# Patient Record
Sex: Male | Born: 1945 | Race: White | Hispanic: No | Marital: Married | Smoking: Never smoker
Health system: Southern US, Community
[De-identification: ages and names within clinical notes are randomized; demographics above are authoritative.]

## PROBLEM LIST (undated history)

## (undated) DIAGNOSIS — I6529 Occlusion and stenosis of unspecified carotid artery: Secondary | ICD-10-CM

## (undated) DIAGNOSIS — Z87442 Personal history of urinary calculi: Secondary | ICD-10-CM

## (undated) DIAGNOSIS — I499 Cardiac arrhythmia, unspecified: Secondary | ICD-10-CM

## (undated) DIAGNOSIS — K760 Fatty (change of) liver, not elsewhere classified: Secondary | ICD-10-CM

## (undated) DIAGNOSIS — E119 Type 2 diabetes mellitus without complications: Secondary | ICD-10-CM

## (undated) DIAGNOSIS — I350 Nonrheumatic aortic (valve) stenosis: Secondary | ICD-10-CM

## (undated) DIAGNOSIS — I10 Essential (primary) hypertension: Secondary | ICD-10-CM

## (undated) DIAGNOSIS — R011 Cardiac murmur, unspecified: Secondary | ICD-10-CM

## (undated) DIAGNOSIS — I25810 Atherosclerosis of coronary artery bypass graft(s) without angina pectoris: Secondary | ICD-10-CM

## (undated) DIAGNOSIS — I739 Peripheral vascular disease, unspecified: Secondary | ICD-10-CM

## (undated) DIAGNOSIS — I70219 Atherosclerosis of native arteries of extremities with intermittent claudication, unspecified extremity: Secondary | ICD-10-CM

## (undated) DIAGNOSIS — E7800 Pure hypercholesterolemia, unspecified: Secondary | ICD-10-CM

## (undated) HISTORY — DX: Essential (primary) hypertension: I10

## (undated) HISTORY — DX: Occlusion and stenosis of unspecified carotid artery: I65.29

## (undated) HISTORY — PX: CHOLECYSTECTOMY: SHX55

## (undated) HISTORY — PX: CORONARY ANGIOPLASTY WITH STENT PLACEMENT: SHX49

## (undated) HISTORY — DX: Cardiac arrhythmia, unspecified: I49.9

## (undated) HISTORY — DX: Atherosclerosis of native arteries of extremities with intermittent claudication, unspecified extremity: I70.219

## (undated) HISTORY — PX: CORONARY ARTERY BYPASS GRAFT: SHX141

## (undated) HISTORY — DX: Cardiac murmur, unspecified: R01.1

## (undated) HISTORY — PX: INGUINAL HERNIA REPAIR: SUR1180

## (undated) HISTORY — DX: Atherosclerosis of coronary artery bypass graft(s) without angina pectoris: I25.810

## (undated) HISTORY — PX: ADENOIDECTOMY: SUR15

## (undated) HISTORY — DX: Pure hypercholesterolemia, unspecified: E78.00

## (undated) HISTORY — PX: TONSILLECTOMY: SUR1361

## (undated) HISTORY — PX: OTHER SURGICAL HISTORY: SHX169

---

## 1998-04-02 ENCOUNTER — Ambulatory Visit (HOSPITAL_COMMUNITY): Admission: RE | Admit: 1998-04-02 | Discharge: 1998-04-02 | Payer: Self-pay | Admitting: Gastroenterology

## 2002-12-25 ENCOUNTER — Encounter: Admission: RE | Admit: 2002-12-25 | Discharge: 2003-03-25 | Payer: Self-pay | Admitting: Family Medicine

## 2007-10-08 ENCOUNTER — Encounter: Admission: RE | Admit: 2007-10-08 | Discharge: 2007-10-08 | Payer: Self-pay | Admitting: Otolaryngology

## 2008-02-26 ENCOUNTER — Ambulatory Visit: Payer: Self-pay | Admitting: Cardiology

## 2008-02-26 ENCOUNTER — Inpatient Hospital Stay (HOSPITAL_COMMUNITY): Admission: EM | Admit: 2008-02-26 | Discharge: 2008-03-03 | Payer: Self-pay | Admitting: Cardiology

## 2008-02-27 ENCOUNTER — Ambulatory Visit: Payer: Self-pay | Admitting: Cardiothoracic Surgery

## 2008-02-27 DIAGNOSIS — I25810 Atherosclerosis of coronary artery bypass graft(s) without angina pectoris: Secondary | ICD-10-CM | POA: Insufficient documentation

## 2008-03-05 ENCOUNTER — Ambulatory Visit: Payer: Self-pay | Admitting: Internal Medicine

## 2008-03-05 ENCOUNTER — Inpatient Hospital Stay (HOSPITAL_COMMUNITY): Admission: EM | Admit: 2008-03-05 | Discharge: 2008-03-07 | Payer: Self-pay | Admitting: Internal Medicine

## 2008-03-24 ENCOUNTER — Ambulatory Visit: Payer: Self-pay | Admitting: Cardiothoracic Surgery

## 2008-03-24 ENCOUNTER — Encounter
Admission: RE | Admit: 2008-03-24 | Discharge: 2008-03-24 | Payer: Self-pay | Admitting: Thoracic Surgery (Cardiothoracic Vascular Surgery)

## 2008-04-08 ENCOUNTER — Ambulatory Visit: Payer: Self-pay | Admitting: Cardiovascular Disease

## 2008-04-17 ENCOUNTER — Encounter (HOSPITAL_COMMUNITY): Admission: RE | Admit: 2008-04-17 | Discharge: 2008-07-16 | Payer: Self-pay | Admitting: Cardiovascular Disease

## 2008-04-25 ENCOUNTER — Ambulatory Visit: Payer: Self-pay

## 2008-06-09 ENCOUNTER — Ambulatory Visit: Payer: Self-pay | Admitting: Cardiovascular Disease

## 2008-06-09 LAB — CONVERTED CEMR LAB
Albumin: 3.9 g/dL (ref 3.5–5.2)
Calcium: 9.1 mg/dL (ref 8.4–10.5)
Creatinine, Ser: 1 mg/dL (ref 0.4–1.5)
GFR calc Af Amer: 97 mL/min
Glucose, Bld: 127 mg/dL — ABNORMAL HIGH (ref 70–99)
Total Bilirubin: 0.9 mg/dL (ref 0.3–1.2)
Total CHOL/HDL Ratio: 4
Total Protein: 7.3 g/dL (ref 6.0–8.3)

## 2008-06-20 ENCOUNTER — Ambulatory Visit: Payer: Self-pay | Admitting: Cardiovascular Disease

## 2008-08-12 ENCOUNTER — Ambulatory Visit: Payer: Self-pay | Admitting: Cardiovascular Disease

## 2008-08-15 ENCOUNTER — Ambulatory Visit: Payer: Self-pay | Admitting: Internal Medicine

## 2008-08-15 ENCOUNTER — Inpatient Hospital Stay (HOSPITAL_COMMUNITY): Admission: AD | Admit: 2008-08-15 | Discharge: 2008-08-16 | Payer: Self-pay | Admitting: Internal Medicine

## 2008-09-10 ENCOUNTER — Ambulatory Visit: Payer: Self-pay | Admitting: Cardiovascular Disease

## 2008-09-13 DIAGNOSIS — I70219 Atherosclerosis of native arteries of extremities with intermittent claudication, unspecified extremity: Secondary | ICD-10-CM | POA: Insufficient documentation

## 2008-09-13 DIAGNOSIS — I10 Essential (primary) hypertension: Secondary | ICD-10-CM | POA: Insufficient documentation

## 2008-09-13 DIAGNOSIS — E785 Hyperlipidemia, unspecified: Secondary | ICD-10-CM | POA: Insufficient documentation

## 2008-11-24 ENCOUNTER — Ambulatory Visit: Payer: Self-pay | Admitting: Cardiovascular Disease

## 2008-11-24 LAB — CONVERTED CEMR LAB
Alkaline Phosphatase: 75 units/L (ref 39–117)
Bilirubin, Direct: 0.1 mg/dL (ref 0.0–0.3)
Total Bilirubin: 1 mg/dL (ref 0.3–1.2)
Total CHOL/HDL Ratio: 4.3

## 2008-12-01 ENCOUNTER — Encounter: Payer: Self-pay | Admitting: Cardiovascular Disease

## 2008-12-01 ENCOUNTER — Ambulatory Visit: Payer: Self-pay | Admitting: Cardiovascular Disease

## 2009-04-13 ENCOUNTER — Ambulatory Visit: Payer: Self-pay | Admitting: Cardiovascular Disease

## 2009-04-15 ENCOUNTER — Ambulatory Visit: Payer: Self-pay | Admitting: Cardiovascular Disease

## 2009-04-21 LAB — CONVERTED CEMR LAB
ALT: 21 units/L (ref 0–53)
HDL: 36.8 mg/dL — ABNORMAL LOW (ref >39.00)
Total Bilirubin: 1 mg/dL (ref 0.3–1.2)
Total CHOL/HDL Ratio: 4
Triglycerides: 109 mg/dL (ref 0.0–149.0)
VLDL: 21.8 mg/dL (ref 0.0–40.0)

## 2009-10-01 ENCOUNTER — Encounter: Payer: Self-pay | Admitting: Cardiovascular Disease

## 2009-10-19 ENCOUNTER — Ambulatory Visit: Payer: Self-pay | Admitting: Cardiovascular Disease

## 2009-11-09 ENCOUNTER — Ambulatory Visit: Payer: Self-pay | Admitting: Cardiovascular Disease

## 2009-11-11 ENCOUNTER — Telehealth (INDEPENDENT_AMBULATORY_CARE_PROVIDER_SITE_OTHER): Payer: Self-pay | Admitting: *Deleted

## 2009-11-11 LAB — CONVERTED CEMR LAB
AST: 18 units/L (ref 0–37)
Alkaline Phosphatase: 53 units/L (ref 39–117)
Bilirubin, Direct: 0.1 mg/dL (ref 0.0–0.3)
Cholesterol: 159 mg/dL (ref 0–200)

## 2010-05-10 ENCOUNTER — Ambulatory Visit: Payer: Self-pay | Admitting: Cardiovascular Disease

## 2010-05-13 ENCOUNTER — Encounter: Payer: Self-pay | Admitting: Cardiovascular Disease

## 2010-11-04 ENCOUNTER — Encounter: Payer: Self-pay | Admitting: Cardiovascular Disease

## 2010-11-08 ENCOUNTER — Encounter: Payer: Self-pay | Admitting: Cardiovascular Disease

## 2010-11-09 ENCOUNTER — Encounter: Payer: Self-pay | Admitting: Cardiovascular Disease

## 2010-11-09 ENCOUNTER — Other Ambulatory Visit: Payer: Self-pay | Admitting: Cardiovascular Disease

## 2010-11-09 ENCOUNTER — Ambulatory Visit
Admission: RE | Admit: 2010-11-09 | Discharge: 2010-11-09 | Payer: Self-pay | Source: Home / Self Care | Attending: Cardiovascular Disease | Admitting: Cardiovascular Disease

## 2010-11-10 LAB — CBC WITH DIFFERENTIAL/PLATELET
Basophils Absolute: 0 10*3/uL (ref 0.0–0.1)
Basophils Relative: 0.5 % (ref 0.0–3.0)
HCT: 47.3 % (ref 39.0–52.0)
Hemoglobin: 16.2 g/dL (ref 13.0–17.0)
Lymphs Abs: 2.6 10*3/uL (ref 0.7–4.0)
Monocytes Absolute: 0.8 10*3/uL (ref 0.1–1.0)
Monocytes Relative: 8.6 % (ref 3.0–12.0)
Platelets: 273 10*3/uL (ref 150.0–400.0)
RDW: 13.8 % (ref 11.5–14.6)
WBC: 9.3 10*3/uL (ref 4.5–10.5)

## 2010-11-10 LAB — BASIC METABOLIC PANEL
Calcium: 9.7 mg/dL (ref 8.4–10.5)
Creatinine, Ser: 1.1 mg/dL (ref 0.4–1.5)
Potassium: 4 mEq/L (ref 3.5–5.1)
Sodium: 142 mEq/L (ref 135–145)

## 2010-11-10 LAB — PROTIME-INR: INR: 1 ratio (ref 0.8–1.0)

## 2010-11-14 LAB — CONVERTED CEMR LAB
Albumin: 4.3 g/dL (ref 3.5–5.2)
Bilirubin, Direct: 0.2 mg/dL (ref 0.0–0.3)
LDL Cholesterol: 100 mg/dL — ABNORMAL HIGH (ref 0–99)
Total Protein: 7.3 g/dL (ref 6.0–8.3)
Triglycerides: 126 mg/dL (ref 0.0–149.0)

## 2010-11-15 ENCOUNTER — Ambulatory Visit (HOSPITAL_COMMUNITY)
Admission: RE | Admit: 2010-11-15 | Discharge: 2010-11-15 | Payer: Self-pay | Source: Home / Self Care | Attending: Cardiovascular Disease | Admitting: Cardiovascular Disease

## 2010-11-15 LAB — GLUCOSE, CAPILLARY: Glucose-Capillary: 160 mg/dL — ABNORMAL HIGH (ref 70–99)

## 2010-11-16 NOTE — Miscellaneous (Signed)
Summary: Orders Update  Clinical Lists Changes  Orders: Added new Test order of TLB-Lipid Panel (80061-LIPID) - Signed Added new Test order of TLB-Hepatic/Liver Function Pnl (80076-HEPATIC) - Signed

## 2010-11-16 NOTE — Procedures (Signed)
NAMEREINO, LYBBERT                 ACCOUNT NO.:  [id number]  MEDICAL RECORD NO.:  [id number]          PATIENT TYPE:  OIB  LOCATION:  2899                         FACILITY:  [facility]  PHYSICIAN:  [name]. [name], MD  DATE OF BIRTH:  August 26, 1946  DATE OF PROCEDURE:  11/15/2010 DATE OF DISCHARGE:  11/15/2010                           CARDIAC CATHETERIZATION   PROCEDURE: 1. Selective coronary angiography. 2. Saphenous vein graft angiography. 3. Left internal mammary artery angiography.  PROCEDURAL INDICATIONS:  Mr. Pfost is a 65 year old gentleman with CAD status post coronary bypass surgery in 2009.  He had early vein graft disease and was treated with PCI in October 2009 involving the distal anastomotic site of the saphenous vein graft to obtuse marginal.  He was treated with a drug-eluting stent.  He underwent exercise treadmill testing and was found to have significant ECG changes and ventricular ectopy.  He was referred for cardiac catheterization to evaluate for progressive obstructive disease in the setting of his extensive CAD and bypass graft disease.  Risks and indications of procedure were reviewed with the patient and informed consent was obtained.  The left wrist was prepped, draped, anesthetized with 1% lidocaine.  Using modified Seldinger technique, a 5- French sheath was placed in the left radial artery.  Unfractionated heparin 4000 units was administered intravenously, 3 mg of verapamil was administered through the radial sheath.  Standard Judkins catheters were used for coronary angiography.  Left internal mammary catheter was used to image the LIMA.  An AL-1 catheter was used to image the saphenous vein graft to diagonal.  The JR-4 catheter was used to image the saphenous vein graft to OM and an MPA-1 catheter was used to image the saphenous vein graft to distal right coronary artery.  All catheter exchanges were performed over an exchange length J-wire.  The  patient tolerated the procedure well.  There were no immediate complications.  PROCEDURAL FINDINGS: 1. Aortic pressure is 144/82 with a mean of 100. 2. Left main coronary artery:  The left mainstem is severely calcified     with a 90% distal left main stenosis.  The ostial LAD is totally     occluded and heavily calcified.  The ostial Circumflex has diffuse 90% stenosis leading into the AV groove circumflex which has minimal filling because of competitive graft flow. 1. Right coronary artery:  The right coronary artery is patent     throughout its course and has diffuse irregularity with 50% mid     stenosis and 50% distal stenosis.  The ostium of the PDA branch     also has 50% stenosis.  There are no critical stenoses throughout     the course of the right coronary artery but there is diffuse     disease throughout the entire vessel.  The distal RCA and its     branch vessels fill competitively from both native and graft flow. 2. Saphenous vein graft angiography.  The saphenous vein graft to     obtuse marginal branch is patent.  There is a patent small stent at     the distal anastomotic site.  There is significant mismatch between     the size of the vein graft and the native circulation, but the     graft flow is good and it is greatly improved from the previous     study.  There is mild-to-moderate diffuse restenosis throughout the     small stent at that site.  Both OM branches fill well with TIMI 3     flow. 3. Saphenous vein graft to diagonal is widely patent.  There is no     significant stenosis.  The graft also retrograde fills the LAD.     The proximal diagonal before the graft insertion site has 95%     stenosis.  The proximal LAD has 90% stenosis just beyond the origin     of the grafted diagonal branch. 4. Saphenous vein graft to distal right coronary artery is widely     patent.  There is no significant stenosis throughout the body of     that graft. 5. Left internal  mammary artery to LAD is widely patent.  The LAD     fills competitively with much of the flow coming from the diagonal     but also some antegrade flow through the left internal mammary.  ASSESSMENT: 1. Severe three-vessel coronary artery disease with severe distal left     main stenosis, total occlusion of the left anterior descending,     severe proximal left circumflex stenosis, and moderate diffuse     right coronary artery stenosis. 2. Status post coronary bypass surgery with continued patency of the     saphenous vein graft to distal right coronary artery, saphenous     vein graft to diagonal, and left internal mammary artery to left     anterior descending. 3. Patency of the saphenous vein graft to obtuse marginal with size     mismatch between the vein graft and the distal vessel but continued     patency of the stent at distal anastomotic site.  RECOMMENDATIONS:  Recommend continued medical therapy.     [name]. [name], MD     MDC/MEDQ  D:  11/15/2010  T:  11/15/2010  Job:  [id number]  cc:   [name], M.D.  Electronically Signed by Sherren Mocha MD on 11/16/2010 04:58:54 AM

## 2010-11-16 NOTE — Assessment & Plan Note (Signed)
Summary: f2mdiscuss cholesterol/lwb   Visit Type:  Follow-up Primary Provider:  TOdis Luster MD  CC:  leg pain.  History of Present Illness: Mr. [name] [name] a 65year old gentleman with coronary artery disease who underwent coronary bypass surgery in May 2009 followed by PCI of the saphenous vein graft to OM anastomotic site in October 2009. He presents today for followup.  The patient is doing well at present. He develops mild chest pain when walking on the treadmill, occurring at about 7 min. He is able to 'walk through it' and continues for a 20 min walk. The patient denies dyspnea, orthopnea, PND, edema, palpitations, lightheadedness, or syncope.  He also has mild right leg claudication involving the calf wiht heavy exertion. This is stable from previous.   Current Medications (verified): 1)  Simvastatin 40 Mg Tabs (Simvastatin) .... Take One Tablet By Mouth Daily At Bedtime 2)  Plavix 75 Mg Tabs (Clopidogrel Bisulfate) .... Take One Tablet By Mouth Daily 3)  Benazepril Hcl 40 Mg Tabs (Benazepril Hcl) .... Take 1 Tablet By Mouth Once A Day 4)  Metoprolol Tartrate 50 Mg Tabs (Metoprolol Tartrate) .... Take One and A Half  Tablet By Mouth Twice A Day 5)  Aspirin 81 Mg Tbec (Aspirin) .... Take One Tablet By Mouth Daily 6)  Nitrolingual 0.4 Mg/spray Soln (Nitroglycerin) .... One Spray Under Tongue Every 5 Minutes As Needed For Chest Pain---May Repeat Times Three  Allergies (verified): No Known Drug Allergies  Past History:  Past medical history reviewed for relevance to current acute and chronic problems.  Past Medical History: Reviewed history from 09/13/2008 and no changes required. CAD s/p CABG 02/27/08 PCI SVG-OM 08/15/08 PAD with ABI 0.7 on right, distal popliteal occlusion, mild intermittent claudication hyperlipidemia hypertension impaired fasting glucose  Review of Systems       Negative except as per HPI   Vital Signs:  Patient profile:   65year old  male Height:      70 inches Weight:      206 pounds BMI:     29.66 Pulse rate:   63 / minute Resp:     16 per minute BP sitting:   130 / 86  (left arm)  Vitals Entered By: CLevora Angel CNA (October 19, 2009 9:01 AM)  Physical Exam  General:  Pt is alert and oriented, in no acute distress. HEENT: normal Neck: normal carotid upstrokes without bruits, JVP normal Lungs: CTA CV: RRR without murmur or gallop Abd: soft, NT, positive BS, no bruit, no organomegaly Ext: no clubbing, cyanosis, or edema.  Skin: warm and dry without rash    EKG  Procedure date:  10/19/2009  Findings:      NSR, within normal limits, HR 63 bpm.  Impression & Recommendations:  Problem # 1:  CAD, ARTERY BYPASS GRAFT (ICD-414.04) Stable with class II angina. Symptoms are not lifestyle limiting at present. Recommend continue current medical therapy.   His updated medication list for this problem includes:    Plavix 75 Mg Tabs (Clopidogrel bisulfate) ..Marland Kitchen.. Take one tablet by mouth daily    Benazepril Hcl 40 Mg Tabs (Benazepril hcl) ..Marland Kitchen.. Take 1 tablet by mouth once a day    Metoprolol Tartrate 50 Mg Tabs (Metoprolol tartrate) ..Marland Kitchen.. Take one and a half  tablet by mouth twice a day    Aspirin 81 Mg Tbec (Aspirin) ..Marland Kitchen.. Take one tablet by mouth daily    Nitrolingual 0.4 Mg/spray Soln (Nitroglycerin) ..... 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 Cont med Rx and walking program.  Problem # 3:  HYPERTENSION, BENIGN (ICD-401.1) Controlled. His updated medication list for this problem includes:    Benazepril Hcl 40 Mg Tabs (Benazepril hcl) .Marland Kitchen... Take 1 tablet by mouth once a day    Metoprolol Tartrate 50 Mg Tabs (Metoprolol tartrate) .Marland Kitchen... Take one and a half  tablet by mouth twice a day     Aspirin 81 Mg Tbec (Aspirin) .Marland Kitchen... Take one tablet by mouth daily  BP today: 130/86 Prior BP: 110/70 (04/13/2009)  Labs Reviewed: K+: 4.3 (06/09/2008) Creat: : 1.0 (06/09/2008)   Chol: 144 (04/15/2009)   HDL: 36.80 (04/15/2009)   LDL: 85 (04/15/2009)   TG: 109.0 (04/15/2009)  Problem # 4:  HYPERLIPIDEMIA-MIXED (ICD-272.4) Recent labs from Dr Lorette Ang office reviewed. LDL was 109. Recommend change simvastatin to crestor to try to achieve LDL < 100 mg/dL. The following medications were removed from the medication list:    Simvastatin 40 Mg Tabs (Simvastatin) .Marland Kitchen... Take one tablet by mouth daily at bedtime His updated medication list for this problem includes:    Crestor 20 Mg Tabs (Rosuvastatin calcium) .Marland Kitchen... Take one tablet by mouth daily.  CHOL: 144 (04/15/2009)   LDL: 85 (04/15/2009)   HDL: 36.80 (04/15/2009)   TG: 109.0 (04/15/2009)  Patient Instructions: 1)  Your physician has recommended you make the following change in your medication: Stop Sinvastatin. Start Crestor 20 mg once a day. 2)  Your physician recommends that you return for a FASTING lipid profile: And Liver Profile in 3 weeks.414.04 3)  Your physician recommends that you schedule a follow-up appointment in: 6 months. The office will mail you a reminder 2 months prior appointment date. Prescriptions: CRESTOR 20 MG TABS (ROSUVASTATIN CALCIUM) Take one tablet by mouth daily.  #90 x 3   Entered by:   Carollee Sires, RN, BSN   Authorized by:   Neale Burly, MD   Signed by:   Carollee Sires, RN, BSN on 10/19/2009   Method used:   Electronically to        [facility] (retail)       [phone]       [place], [place]  [address]       Ph: [phone]       Fax: [phone]   RxID:   [id number]

## 2010-11-16 NOTE — Progress Notes (Signed)
Summary: returning call  Phone Note Call from Patient Call back at Work Phone [phone]   Reason for Call: Talk to Nurse Summary of Call: returning call Initial call taken by: Darnell Level,  November 11, 2009 8:38 AM  Follow-up for Phone Call        Called patient back.  Question was regarding lipid results...  advised him to continue current rx and  push walking  and watch sweets.     Follow-up by: Gasper Sells, EMT,  November 11, 2009 8:49 AM

## 2010-11-16 NOTE — Miscellaneous (Signed)
Summary: Orders Update  Clinical Lists Changes

## 2010-11-16 NOTE — Assessment & Plan Note (Signed)
Summary: PER CHECK OUT   Visit Type:  Follow-up Primary Provider:  Odis Luster, MD  CC:  Indigestion.  History of Present Illness: Mr. Down is a 66 year old gentleman with coronary artery disease who underwent coronary bypass surgery in May 2009 followed by PCI of the saphenous vein graft to OM anastomotic site in October 2009. He presents today for followup.  At the time of his last visit he complained of exeritonal angina at 7 minutes of exercise, but he was able to continue on and symptoms would abate. He now reports these symptoms have resolved completely. He denies dyspnea, edema, or palpitations. He walks 20-25 minutes at a pace of 3.5 mph and a level 5 incline.  Complains of right calf claudication, but less than previous and not lifestyle-limiting.  Current Medications (verified): 1)  Plavix 75 Mg Tabs (Clopidogrel Bisulfate) .... Take One Tablet By Mouth Daily 2)  Benazepril Hcl 40 Mg Tabs (Benazepril Hcl) .... Take 1 Tablet By Mouth Once A Day 3)  Metoprolol Tartrate 50 Mg Tabs (Metoprolol Tartrate) .... Take One and A Half  Tablet By Mouth Twice A Day 4)  Aspirin 81 Mg Tbec (Aspirin) .... Take One Tablet By Mouth Daily 5)  Nitrolingual 0.4 Mg/spray Soln (Nitroglycerin) .... One Spray Under Tongue Every 5 Minutes As Needed For Chest Pain---May Repeat Times Three 6)  Crestor 20 Mg Tabs (Rosuvastatin Calcium) .... Take One Tablet By Mouth Daily.  Allergies (verified): No Known Drug Allergies  Past History:  Past medical history reviewed for relevance to current acute and chronic problems.  Past Medical History: Reviewed history from 09/13/2008 and no changes required. CAD s/p CABG 02/27/08 PCI SVG-OM 08/15/08 PAD with ABI 0.7 on right, distal popliteal occlusion, mild intermittent claudication hyperlipidemia hypertension impaired fasting glucose  Review of Systems       Negative except as per HPI   Vital Signs:  Patient profile:   65 year old male Height:       70 inches Weight:      211 pounds BMI:     30.38 Pulse rate:   65 / minute Pulse rhythm:   regular Resp:     18 per minute BP sitting:   120 / 80  (right arm) Cuff size:   large  Vitals Entered By: Sidney Ace (May 10, 2010 12:28 PM)  Physical Exam  General:  Pt is alert and oriented, in no acute distress. HEENT: normal Neck: normal carotid upstrokes without bruits, JVP normal Lungs: CTA CV: RRR without murmur or gallop Abd: soft, NT, positive BS, no bruit, no organomegaly Ext: no clubbing, cyanosis, or edema.  Skin: warm and dry without rash    EKG  Procedure date:  05/10/2010  Findings:      NSR, within normal limits, HR 65 bpm.  Impression & Recommendations:  Problem # 1:  CAD, ARTERY BYPASS GRAFT (ICD-414.04) Stable without angina. Continue risk reduction measures, f/u with a nonimaging exercise treadmill test in 6 months.  His updated medication list for this problem includes:    Plavix 75 Mg Tabs (Clopidogrel bisulfate) .Marland Kitchen... Take one tablet by mouth daily    Benazepril Hcl 40 Mg Tabs (Benazepril hcl) .Marland Kitchen... Take 1 tablet by mouth once a day    Metoprolol Tartrate 50 Mg Tabs (Metoprolol tartrate) .Marland Kitchen... Take one and a half  tablet by mouth twice a day    Aspirin 81 Mg Tbec (Aspirin) .Marland Kitchen... Take one tablet by mouth daily    Nitrolingual 0.4 Mg/spray Soln (Nitroglycerin) .Marland KitchenMarland KitchenMarland KitchenMarland Kitchen  One spray under tongue every 5 minutes as needed for chest pain---may repeat times three  Orders: EKG w/ Interpretation (93000) TLB-Lipid Panel (80061-LIPID) TLB-Hepatic/Liver Function Pnl (80076-HEPATIC) Treadmill (Treadmill)  Problem # 2:  HYPERLIPIDEMIA-MIXED (ICD-272.4) Prior lipid panel below - repeat lipids and LFT's today. If stable, will go to 12 month testing.  His updated medication list for this problem includes:    Crestor 20 Mg Tabs (Rosuvastatin calcium) .Marland Kitchen... Take one tablet by mouth daily.  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 nonlimiting right calf claudication. Continue current Rx.  Problem # 4:  HYPERTENSION, BENIGN (ICD-401.1) BP controlled.  His updated medication list for this problem includes:    Benazepril Hcl 40 Mg Tabs (Benazepril hcl) .Marland Kitchen... Take 1 tablet by mouth once a day    Metoprolol Tartrate 50 Mg Tabs (Metoprolol tartrate) .Marland Kitchen... Take one and a half  tablet by mouth twice a day    Aspirin 81 Mg Tbec (Aspirin) .Marland Kitchen... Take one tablet by mouth daily  BP today: 120/80 Prior BP: 130/86 (10/19/2009)  Labs Reviewed: K+: 4.3 (06/09/2008) Creat: : 1.0 (06/09/2008)   Chol: 159 (11/09/2009)   HDL: 38.10 (11/09/2009)   LDL: 83 (11/09/2009)   TG: 192.0 (11/09/2009)  Patient Instructions: 1)  Your physician recommends that you have lab work today: lipid/liver (414.01;272.0) 2)  Your physician has requested that you have an exercise tolerance test in 6 months with Dr. [name].  For further information please visit [email or web address].  Please also follow instruction sheet, as given. 3)  Your physician recommends that you continue on your current medications as directed. Please refer to the Current Medication list given to you today.

## 2010-11-16 NOTE — Letter (Signed)
Summary: [facility] [address] [place]   [place], [place] [address]   Phone: [phone]  Fax: [phone]     May 13, 2010 MRN: [id number]   [place], [place]  [address]   Dear Mr. Febo,  We have reviewed your cholesterol results.  They are as follows:     Total Cholesterol:    166 (Desirable: less than 200)       HDL  Cholesterol:     41.00  (Desirable: greater than 40 for men and 50 for women)       LDL Cholesterol:       100  (Desirable: less than 100 for low risk and less than 70 for moderate to high risk)       Triglycerides:       126.0  (Desirable: less than 150)  Our recommendations include: Continue your current medications and continue to work on diet/exercixe.  Your liver function was normal.   Call our office at the number listed above if you have any questions.  Lowering your LDL cholesterol is important, but it is only one of a large number of "risk factors" that may indicate that you are at risk for heart disease, stroke or other complications of hardening of the arteries.  Other risk factors include:   A.  Cigarette Smoking* B.  High Blood Pressure* C.  Obesity* D.   Low HDL Cholesterol (see yours above)* E.   Diabetes Mellitus (higher risk if your is uncontrolled) F.  Family history of premature heart disease G.  Previous history of stroke or cardiovascular disease    *These are risk factors YOU HAVE CONTROL OVER.  For more information, visit .  There is now evidence that lowering the TOTAL CHOLESTEROL AND LDL CHOLESTEROL can reduce the risk of heart disease.  The American Heart Association recommends the following guidelines for the treatment of elevated cholesterol:  1.  If there is now current heart disease and less than two risk factors, TOTAL CHOLESTEROL should be less than 200 and LDL CHOLESTEROL should be less than 100. 2.  If there is current heart disease or two or more  risk factors, TOTAL CHOLESTEROL should be less than 200 and LDL CHOLESTEROL should be less than 70.  A diet low in cholesterol, saturated fat, and calories is the cornerstone of treatment for elevated cholesterol.  Cessation of smoking and exercise are also important in the management of elevated cholesterol and preventing vascular disease.  Studies have shown that 30 to 60 minutes of physical activity most days can help lower blood pressure, lower cholesterol, and keep your weight at a healthy level.  Drug therapy is used when cholesterol levels do not respond to therapeutic lifestyle changes (smoking cessation, diet, and exercise) and remains unacceptably high.  If medication is started, it is important to have you levels checked periodically to evaluate the need for further treatment options.  Thank you,    [facility]

## 2010-11-18 NOTE — Letter (Signed)
Summary: Cardiac Catheterization Instructions- Main Lab  [facility]  [address] [place]   [place], [place] [address]   Phone: [phone]  Fax: [phone]     11/09/2010 MRN: [id number]  [place], [place]  [address]  Dear Mr. Mcduffie,   You are scheduled for Cardiac Catheterization on 11/15/10              with Dr. [name]  .  Please arrive at the [facility] at 5:30     a.m. on the day of your procedure.  1. DIET     __x__ Nothing to eat or drink after midnight except your medications with a sip of water.   2. MAKE SURE YOU TAKE YOUR ASPIRIN.        __x__ YOU MAY TAKE ALL of your remaining medications with a small amount of water.       3. Plan for one night stay - bring personal belongings (i.e. toothpaste, toothbrush, etc.)  4. Bring a current list of your medications and current insurance cards.  5. Must have a responsible person to drive you home.   6. Someone must be with yu for the first 24 hours after you arrive home.  7. Please wear clothes that are easy to get on and off and wear slip-on shoes.  *Special note: Every effort is made to have your procedure done on time.  Occasionally there are emergencies that present themselves at the hospital that may cause delays.  Please be patient if a delay does occur.  If you have any questions after you get home, please call the office at the number listed above.  Whitney Jannett Celestine RN

## 2010-11-18 NOTE — Miscellaneous (Signed)
Summary: Orders Update  Clinical Lists Changes  Orders: Added new Test order of TLB-BMP (Basic Metabolic Panel-BMET) (32003-LDKCCQF) - Signed Added new Test order of TLB-CBC Platelet - w/Differential (85025-CBCD) - Signed Added new Test order of TLB-PT (Protime) (85610-PTP) - Signed

## 2010-12-23 NOTE — Letter (Signed)
Summary: Silver/Script  Silver/Script   Imported By: Marilynne Drivers 12/16/2010 16:17:24  _____________________________________________________________________  External Attachment:    Type:   Image     Comment:   External Document

## 2011-03-01 NOTE — H&P (Signed)
NAMEWESTEN, DININO                 ACCOUNT NO.:  [id number]   MEDICAL RECORD NO.:  [id number]          PATIENT TYPE:  EMS   LOCATION:  ED                           FACILITY:  [facility]   PHYSICIAN:  [name], MD DATE OF BIRTH:  Feb 04, 1946   DATE OF ADMISSION:  02/26/2008  DATE OF DISCHARGE:                              HISTORY & PHYSICAL   PRIMARY CARE PHYSICIAN:  Kerman Passey, MD.   REASON FOR ADMISSION:  Chest pain.   HISTORY OF PRESENT ILLNESS:  Mr. Fitzhenry is a pleasant, 65 year old male  with a long term history of hypertension and recently diagnosed  borderline diabetes mellitus to be managed by diet initially.  He has  no personal history of cardiovascular disease, tobacco use, or  hyperlipidemia.  He states that he had a stress test many years ago that  was reassuring and typically has had no limitations due to chest pain or  shortness of breath.  He states that over the last three weeks he has  been experiencing new onset chest tightness that resolves with rest.  This has progressed both in frequency and in intensity culminating in a  more severe episode today that prompted an evaluation in the emergency  department.  On my examination now, he is pain free on nitroglycerin and  heparin.  He states that he has not had any rest episodes, although did  experience recurrent symptoms while he was walking in from his car to be  seen today.  His electrocardiogram done on presentation shows ST segment  depression up to a millimeter predominantly in the lateral leads, less  so in the inferior leads, suggestive of ischemic change.  His initial  point-of-care markers are normal at this point, and his chest x-ray  demonstrates no acute disease process.   ALLERGIES:  No known drug allergies.   MEDICATIONS AT HOME:  Hydrochlorothiazide and another blood pressure  pill.   PAST MEDICAL HISTORY:  Is detailed above.  He also reports a history of  internal hemorrhoids and has  occasional hematochezia.  He reports a  colonoscopy approximately one year ago by Dr. Watt Climes.  He has a prior  history of open cholecystectomy, right herniorrhaphy, and tonsillectomy  and adenoidectomy in childhood.  Denies any other major illnesses.   SOCIAL HISTORY:  Patient is married, he lives in [place].  He denies  any tobacco or alcohol use.  He works in [employer].   FAMILY HISTORY:  Reviewed and is noncontributory for premature  cardiovascular disease based on the maternal side of the family.  He is  not aware of any prior medical history involving his father's side of  the family.  His siblings have no premature cardiovascular disease.   REVIEW OF SYSTEMS:  As outlined above.  He denies any problems with  fever or chills.  He has had increased energy over the last few days.  No obvious melena or recent hematochezia.  Bowel and bladder habits  normal.  Appetite normal.  He has been able to lose 13 pounds via diet  over the last several weeks.  No claudication, lower extremity edema,  orthopnea, PND, or palpitations.  Systems are otherwise negative.   PHYSICAL EXAMINATION:  VITAL SIGNS:  Temperature is 98.2 degrees, heart  rate initially 117 now down into the 80s, respirations 18, blood  pressure 188/98 down to 145/79, oxygen saturation 99% on 3 L nasal  cannula.  GENERAL:  This was an overweight male in no acute distress without  active chest pain.  HEENT:  Conjunctivae, lids normal.  Oropharynx is clear.  NECK:  Supple, no elevated jugulovenous pressure, no loud bruits, no  thyromegaly is noted.  LUNGS:  Clear without labored breathing at rest.  CARDIAC EXAM:  Regular rate and rhythm.  There is a 2/6 murmur noted at  the base, the second heart sound is preserved, no pericardial rub or S3  gallop.  ABDOMEN:  Soft, obese, no obvious hepatomegaly, no bruits or tenderness  to palpation.  Cholecystectomy scar noted.  EXTREMITIES:  Exhibit no frank pitting edema, distal pulses are  2+  dorsalis pedis bilaterally, and no pitting edema noted.  MUSCULOSKELETAL:  No kyphosis noted.  NEUROPSYCHIATRIC:  The patient is alert and oriented x3.  Affect seems  appropriate.   LABORATORY DATA:  WBC is 9.8, hemoglobin is 15.4, hematocrit 45.6,  platelets 297, sodium 141, potassium 3.7, BUN 13, creatinine 1.2,  glucose 162.  Point-of-care troponin-I less than 0.05, INR 1.0, point-of-  care CK-MB 1.9.   IMPRESSION:  1. Progressive exertional chest pain concerning for unstable angina      with symptoms worsening over the last three weeks culminating in a      more severe episode of chest pain today.  Electrocardiogram at      presentation is consistent with ischemia, although initial point-of-      care cardiac markers are normal.  The patient is presently pain      free on intravenous nitroglycerin and heparin.  2. History of hypertension.  3. Recently diagnosed borderline diabetes mellitus.  4. Unknown lipid status.   PLAN:  I reviewed the situation with the patient and his family present.  I have recommended admission to the hospital in anticipation of a  diagnostic cardiac catheterization for clear definition of the coronary  anatomy and to assess for any revascularization options.  I explained  the potential risks and benefits, and the patient is in agreement to  proceed.  He will be admitted to [facility], to a telemetry  unit.  We will continue aspirin, heparin, nitroglycerin, and initiate  beta blocker therapy as well as statin therapy awaiting followup of his  lipids.  Cardiac markers will be cycled.  Further plans to follow  pending cardiac catheterization tomorrow.      [name], MD  Electronically Signed     SGM/MEDQ  D:  02/26/2008  T:  02/26/2008  Job:  [id number]   cc:   [name], M.D.  Fax: [phone]

## 2011-03-01 NOTE — Op Note (Signed)
[name] [name], [name] [name]                 ACCOUNT NO.:  [id number]   MEDICAL RECORD NO.:  [id number]          PATIENT TYPE:  INP   LOCATION:  2316                         FACILITY:  [facility]   PHYSICIAN:  Glynda Jaeger, M.D.  DATE OF BIRTH:  March 25, 1946   DATE OF PROCEDURE:  02/27/2008  DATE OF DISCHARGE:                               OPERATIVE REPORT   PROCEDURE:  Intraoperative transesophageal echocardiography.   INDICATIONS FOR PROCEDURE:  [name] [name] is a 65 year old white male  who presented with worsening angina, which was in an unstable pattern.  Cardiac catheterization revealed severe distal left main disease, and he  was brought to the operating room to undergo emergency coronary artery  bypass grafting by Dr. Phillip Heal.   DESCRIPTION OF PROCEDURE:  The patient was brought to the operating  room.  General anesthesia was induced without difficulty.  The trachea  was intubated without difficulty.  Transesophageal echocardiography  probe was then inserted into the esophagus without difficulty.   IMPRESSION:  Prebypass Findings:  1. Aortic valve:  The aortic valve appeared trileaflet.  The leaflets      were mildly thickened, but opened normally with no aortic      insufficiency.  2. Mitral valve:  The mitral valve leaflets opened normally.  There      was no significant mitral annular calcification.  There was trace      mitral insufficiency.  3. Left ventricle:  There was mild-to-moderate left ventricular      hypertrophy.  Left jugular wall thickness measured 1.25-1.3 cm in      diastole at the suprapapillary level of the anterior and lateral      walls.  There was mild-to-moderate hypokinesis in the area of the      left ventricular apex, but there appeared to be good contractility      in other segments of the left ventricle.  The ejection fraction was      estimated at 50-55%.  There was no thrombus noted in the left      ventricular apex.  4. Right ventricle:  The right  ventricular size appeared to be within      normal limits with good contractility of the right ventricular free      wall.  5. Tricuspid valve:  The tricuspid valve leaflets were structurally      normal-appearing with trace tricuspid insufficiency.  6. Interatrial septum:  The interatrial septum was intact without      evidence of patent foramen ovale or atrioseptal defect by color      Doppler and bubble study.  7. Left atrium:  There appeared to be no thrombus in the left atrial      cavity or left atrial appendage.  8. Ascending aorta:  The ascending aorta showed no mobile plaque.      There was a well-defined sinotubular ridge and normal contour of      the ascending aorta.  9. Descending aorta:  The descending aorta measured 2.6 cm in diameter      with no significant atheromatous disease noted.  ______________________________  Glynda Jaeger, M.D.    DCJ/MEDQ  D:  02/27/2008  T:  02/28/2008  Job:  [id number]   cc:   Glynda Jaeger, M.D.

## 2011-03-01 NOTE — Assessment & Plan Note (Signed)
[facility]                                 ON-CALL NOTE   NAME:Nida, TRAVORIS BUSHEY                        MRN:          [id number]  DATE:08/10/2008                            DOB:          Mar 16, 1946    I received a page through the answering service for Mr. Decesare, states he  was a patient of Dr. Antionette Char and had had chest discomfort.  Apparently,  he had been out of town today, experienced an episode of chest  heaviness, relieved with nitroglycerin x1.  Prior to the chest  discomfort, he states that he had eaten a hot dog which he does not  normally eat, but that was the only food available at that time, denied  any further episodes of chest discomfort or other associated symptoms.  I informed him if his chest discomfort return, to take the nitroglycerin  again, if he has to take 3 or more, he was to call 911, and he had no  further problems.  I want him to notify Dr. Antionette Char nurse, Lauren on  Monday and let her know of the event as Mr. Blades has known history of  coronary artery disease status post CABG in May 2009.  The patient  verbalized understanding of instructions.      Rosanne Sack, ACNP  Electronically Signed      Champ Mungo. Lovena Le, MD  Electronically Signed   MB/MedQ  DD: 08/12/2008  DT: 08/13/2008  Job #: [id number]

## 2011-03-01 NOTE — Assessment & Plan Note (Signed)
[facility]                            CARDIOLOGY OFFICE NOTE   NAME:[name], [name] LARICCIA                        MRN:          [id number]  DATE:08/12/2008                            DOB:          13-Dec-1945    REASON FOR VISIT:  CAD, status post CABG.   HISTORY OF PRESENT ILLNESS:  [name] [name] is a 65 year old gentleman who  underwent coronary artery bypass surgery in May.  He had a non-ST-  elevation MI and was found to have critical left main stenosis and  underwent treatment with emergency coronary artery bypass.  He presented  with a syncopal episode and with abnormal EKG and underwent followup  cardiac cath that showed patent bypass grafts the month following his  surgery.  When I saw him back in early September, he was doing well with  no symptoms of angina.  However, over the past several weeks, he has  developed recurrent exertional chest pressure.  His symptoms are mild  compared to those in the past, but he clearly has noticed a change and  has reduced his activity level because of his symptoms.  He also  describes mild exertional dyspnea.  He denies edema, orthopnea,  lightheadedness, syncope, palpitations, or PND.  He has no other  complaints at this time.   MEDICATIONS:  1. Lipitor 20 mg daily.  2. Plavix 75 mg daily.  3. Benazepril 40 mg daily.  4. Metoprolol 50 mg twice daily.  5. Aspirin 81 mg daily.   ALLERGIES:  NKDA.   PHYSICAL EXAMINATION:  GENERAL:  The patient is alert and oriented.  He  is in no acute distress.  VITAL SIGNS:  Weight is 189 pounds, blood pressure 132/92, heart rate  79, and respiratory rate 16.  HEENT:  Normal.  NECK:  Normal carotid upstrokes.  No bruits.  JVP normal.  LUNGS:  Clear bilaterally.  HEART:  Regular rate and rhythm.  No murmurs or gallops.  ABDOMEN:  Soft and nontender.  No organomegaly.  EXTREMITIES:  No clubbing, cyanosis, or edema.  Femoral pulses are 2+.  Dorsalis pedis and posterior tibial  pulse are 2+ on the left and not  palpable on the right.   EKG shows normal sinus rhythm and is within normal limits.   ASSESSMENT:  1. Coronary artery disease, status post coronary artery bypass graft.      The patient has recurrent angina.  I am concerned about his      symptoms as they clearly represent a change from his initial post      coronary artery bypass graft course.  We will increase his beta-      blocker to metoprolol 75 mg twice daily and check an exercise      Myoview stress scan to rule out significant ischemia.  If he has      persistent problems, we will add a long-acting nitrate as well.      Depending on the results of his stress test, we will consider      further evaluation with cardiac catheterization.  2. Hypertension.  Blood pressure  control reasonable on current      regimen.  End-diastolic pressure is slightly elevated.  As above,      we will increase his metoprolol dose.  3. Dyslipidemia.  Lipids from August showed a total cholesterol of      121, LDL 69, and HDL 30.  This was greatly improved from his      previous lipid studies.  Continue Lipitor.   For followup, I will see Mr. Fluegge back in 3 months.  We will followup  after his stress test results for further plans.     [name]. [name], MD  Electronically Signed    MDC/MedQ  DD: 08/12/2008  DT: 08/13/2008  Job #: [id number]

## 2011-03-01 NOTE — Consult Note (Signed)
NAMESTILES, [name]                 ACCOUNT NO.:  [id number]   MEDICAL RECORD NO.:  [id number]          PATIENT TYPE:  INP   LOCATION:  2316                         FACILITY:  [facility]   PHYSICIAN:  Lanelle Bal, MD    DATE OF BIRTH:  1946-06-06   DATE OF CONSULTATION:  02/27/2008  DATE OF DISCHARGE:                                 CONSULTATION   REASON FOR CONSULTATION:  Critical left main and proximal LAD disease.   REQUESTING PHYSICIAN:  Dr. [name].   PRIMARY CARE PHYSICIAN:  [name], M.D.   HISTORY OF PRESENT ILLNESS:  The patient is 65 year old male with no  prior cardiac history who over the past 3 weeks has had rapidly  progressing anginal symptoms.  He notes that usually comes on with  exertion, but over the past 3 days, it has become much worse coming on  with very minimal symptoms.  He was admitted on Feb 26, 2008, through  the emergency room.  At the time of admission, he had no chest pain, but  his troponin levels were elevated from 0.22 upto 0.31.  He underwent  cardiac catheterization today by Dr. [name].  Just getting on to the  cath table, he began having chest pain.  He was found to have critical  anatomy involving the proximal LAD and distal left main.  We will plan  to proceed with an emergent coronary artery bypass grafting.   PAST MEDICAL HISTORY:  1. History of hypertension.  2. Diabetes, that is diet and exercise controlled.  3. History of hemorrhoids.   PAST SURGICAL HISTORY:  1. Cholecystectomy.  2. Tonsillectomy.  3. Hernia repair.   SOCIAL HISTORY:  The patient lives in [place] with his wife who is  married.  Denies tobacco or alcohol use.   FAMILY HISTORY:  Father's family history is unknown.  Mother had no  known coronary disease.   REVIEW OF SYSTEMS:  The patient denies fever, chills, or night sweats.  He has had increasing fatigue over the past several weeks.  Denies any  change in bowel habits.  Denies blood in the stool.   PHYSICAL EXAMINATION:  GENERAL:  The patient appears his stated age.  VITAL SIGNS:  His blood pressure is 180/90, heart rate is 80, afebrile.  On 2 liters, O2 sats 99%.  NECK:  I do not appreciate any carotid bruits.  LUNGS:  Clear bilaterally.  CARDIAC:  Reveals regular rate without murmur or gallop.  ABDOMEN:  Soft without tenderness.  EXTREMITIES:  Appears to have adequate vein in both legs for bypass.  Closure device is present in the right groin.  HEMATOLOGIC:  He has no hematoma.   LABORATORY FINDINGS:  Hematocrits 45.6 and white count 9.8.  Creatinine  is 1.2.  The films have been reviewed with Dr. [name] with 50% lesion of  the proximal right, 70% distal left main, 95% proximal LAD, and 80%  lesion in the ramus.  Ejection fraction was depressed approximately 40%  with anterior apical hypokinesis.  Because of the patient's critical  anatomy and rapidly progressive symptoms over the  past several days, it  was decided to proceed with emergency coronary artery bypass grafting,  primarily because of the critical nature of the lesion and also the  ongoing symptoms.  The risks of surgery were discussed with the patient  and his wife and daughter in detail and he is willing to proceed.      Lanelle Bal, MD  Electronically Signed     EG/MEDQ  D:  02/27/2008  T:  02/28/2008  Job:  [id number]   cc:   [name]. [name], MD  [name], M.D.

## 2011-03-01 NOTE — Discharge Summary (Signed)
NAMEWILLIARD, [name] [name]                 ACCOUNT NO.:  [id number]   MEDICAL RECORD NO.:  [id number]          PATIENT TYPE:  INP   LOCATION:  2038                         FACILITY:  [facility]   PHYSICIAN:  Bruce R. Olevia Perches, MD, FACCDATE OF BIRTH:  1946-06-14   DATE OF ADMISSION:  03/05/2008  DATE OF DISCHARGE:  03/07/2008                               DISCHARGE SUMMARY   PRIMARY CARDIOLOGIST:  [name]. [name], MD.   PRIMARY CARE PHYSICIAN:  Jeryl Columbia, MD.   CARDIOVASCULAR/THORACIC SURGEON:  Lanelle Bal, MD.   PROCEDURES PERFORMED DURING HOSPITALIZATION:  1. Cardiac catheterization completed by Dr. Eustace Quail on Mar 05, 2008.      a.     Severe left main and three-vessel coronary artery disease       with patent saphenous vein graft to right coronary artery, patent       saphenous vein graft to diagonal, patent LIMA graft to left       anterior descending, patent sequential saphenous vein graft to       obtuse marginal and  posterolateral with thrombotic lesion in the       distal limb with normal left ventricular ejection fraction.      b.     Thrombosis was dissolved using Angiomax and Plavix 600 mg       p.o.   FINAL DISCHARGE DIAGNOSES:  1. Coronary artery disease.      a.     Status post coronary artery bypass graft secondary to three-       vessel coronary artery disease with five-vessel bypass LIMA graft       to left anterior descending, saphenous vein graft to posterior       descending artery, saphenous vein graft to diagonal and saphenous       vein graft to ramus and obtuse marginal on Feb 29, 2008.      b.     Status post reported myocardial infarction with thrombus       noted in the distal limb of the saphenous vein graft to       circumflex.  2. Diabetes, diet controlled.  3. History of hypertension.  4. Hypercholesterolemia.   HOSPITAL COURSE:  This is a 65 year old Caucasian male who was  transported via EMS to [facility] secondary to acute  inferior  EKG changes.  The patient had been recently discharged post five-vessel  coronary artery bypass grafting.  On the day of admission, the patient  had sudden onset of chest discomfort with nausea and associated  diaphoresis.  The patient's wife called EMS and he was brought to the  emergency room.  He was found to have some inferior ST-segment elevation  and was taken to the cardiac catheterization lab emergently.  It was  found that the patient had a thrombotic lesion in the distal limb of the  SVG to OM and PL, which was relieved using anticoagulation.  The patient  had no further complaints of chest pain.  The patient's cardiac enzymes  were negative throughout the  hospitalization at 0.04, 0.04, and 0.04.  The patient was started on Plavix.  His Lopressor was increased to 50 mg  4 times a day.  The patient's blood pressure remained stable and he was  very anxious to return home on the day of discharge.  The patient had  enough walking in the hall.  He had had no further complaints of  discomfort with any ambulation.  The patient is to follow up with Dr.  Servando Snare and Dr. [name] as an outpatient.   DISCHARGE LABS:  Sodium 136, potassium 3.9, chloride 105, CO2 24, BUN  13, creatinine 1.0, and glucose 123.  Hemoglobin 10.3, hematocrit 30.1,  white blood cells 14.0, and platelets 480.  Urinalysis was completed  secondary to elevated white blood cells, originally on admission of 18.4  and was found to be negative for UTI.  The patient's troponins were  found to be negative.  Chest x-ray revealed resolving left pleural  effusion.  Improved aeration at the left base on admission.   VITAL SIGNS:  Blood pressure 127/82, heart rate 80, respirations 20,  temperature 97.0, and O2 sat 96% on room air.   DISCHARGE MEDICATIONS:  1. Aspirin 325 daily.  2. Lipitor 20 mg daily.  3. Lasix 40 mg daily.  4. Potassium 20 mEq daily.  5. Plavix 75 mg daily (new prescription).  6. Lopressor 40  mg q.i.d. (new prescription I have increased from 50      mg b.i.d.)  7. Oxycodone/APAP 5/500 1-2 tablets every 4 hours p.r.n. pain.  8. He was also given sublingual nitroglycerin p.r.n. chest pain.   ALLERGIES:  No known drug allergies.   FOLLOWUP PLANS AND APPOINTMENTS:  1. The patient will follow up with Dr. Sherren Mocha on April 08, 2008, at 10:15 a.m.  2. The patient will follow up with Dr. Servando Snare on a previously      scheduled appointment postoperatively.  3. The patient will follow up with his primary care physician for      continued medical management.  4. The patient has been given postcardiac catheterization instructions      with particular emphasis on the right groin site for evidence of      bleeding, hematoma, and signs of infection.   TIME SPENT WITH THE PATIENT TO INCLUDE PHYSICIAN TIME:  30 minutes.      Phill Myron. Purcell Nails, NP      [place] Olevia Perches, MD, [facility]  Electronically Signed    KML/MEDQ  D:  03/07/2008  T:  03/08/2008  Job:  [id number]   cc:   [name]. [name], MD  Jeryl Columbia, M.D.  Lanelle Bal, MD

## 2011-03-01 NOTE — Assessment & Plan Note (Signed)
[facility]                            CARDIOLOGY OFFICE NOTE   NAME:[name] [name], [name] [name]                        MRN:          [id number]  DATE:06/20/2008                            DOB:          1946/02/21    REASON FOR VISIT:  Followup CAD status post CABG.   HISTORY OF PRESENT ILLNESS:  [name] is a 65 year old gentleman who  underwent coronary bypass surgery in May 2009.  He presented with a non-  ST-elevation MI and had critical left main stenosis.  He was treated  with emergency CABG.  He had a near syncopal episode in May and his EKG  was suggestive of an ST elevation MI.  He underwent emergent  catheterization that showed patent bypass grafts.  He ultimately ruled  out for myocardial infarction during that hospitalization.  He is  participating in cardiac rehab and is doing quite well at present.  He  denies chest pain, dyspnea, edema, orthopnea, lightheadedness, PND, or  syncope.  His blood pressures have been labile during cardiac rehab.  For the most part, his resting blood pressures have been reasonably well  controlled, but with exercise he has had some high readings.   CURRENT MEDICATIONS:  1. Lipitor 20 mg daily.  2. Plavix 75 mg daily.  3. Benazepril 40 mg daily.  4. Metoprolol 50 mg twice daily.  5. Aspirin 81 mg daily.   ALLERGIES:  NKDA.   PHYSICAL EXAMINATION:  GENERAL:  The patient is alert and oriented.  He  is in no acute distress.  VITAL SIGNS:  Weight is 194 pounds, blood pressure is 120/70 in the  right arm, 118/90 in the left, heart rate is 88, and respiratory rate is  12.  HEENT:  Normoactive.  Normal carotid upstrokes without bruits.  JVP  normal.  LUNGS:  Clear bilaterally.  HEART:  Regular rate and rhythm.  No murmurs or gallops.  ABDOMEN:  Soft and nontender.  No organomegaly.  EXTREMITIES:  There is no clubbing, cyanosis, or edema.  Femoral pulses  are 2+.  Popliteal pulse on the left is 2+.  PT and DP on the  left are  2+.  On the right popliteal, DP, and PT pulses are not palpable.  SKIN:  Warm and dry.  There is no rash.   Lab work from June 09, 2008, shows normal LFTs.  Total cholesterol  121, triglycerides 111, HDL 30, and LDL 69.   Arterial duplex from April 25, 2008, shows an ABI of 0.73 on the right  and 1.1 on the left.  He has total occlusion of the distal popliteal  artery and tibioperoneal trunk on the right.   ASSESSMENT:  1. Coronary artery disease status post coronary artery bypass      grafting.  No angina at present.  Continue current medical therapy,      which includes dual antiplatelet therapy with aspirin and Plavix,      beta blockade with metoprolol, Lipitor, and benazepril.  He is      really tolerating good medical regimen.  I encouraged him to  continue with cardiac rehab and also continue with regular      exercise.  2. Essential hypertension.  His blood pressures have been up and down,      but overall I think his readings are reasonable.  His resting blood      pressure today is excellent and his home readings have been      excellent.  I think we should continue with his current      antihypertensive program.  3. Peripheral arterial disease.  The patient has popliteal artery      occlusion on the right with collateral flow.  His symptoms are      minimal at present.  He is able to walk and participate in cardiac      rehab without much problem.  If he develops significant symptoms,      we will need to perform arteriography.   For followup, I would like to see [name] back in 3 months.     [name]. [name], MD  Electronically Signed    MDC/MedQ  DD: 06/20/2008  DT: 06/21/2008  Job #: [id number]   cc:   [name], M.D.

## 2011-03-01 NOTE — H&P (Signed)
[name] [name], [name] [name]                 ACCOUNT NO.:  [id number]   MEDICAL RECORD NO.:  [id number]          PATIENT TYPE:  INP   LOCATION:  6527                         FACILITY:  [facility]   PHYSICIAN:  Shaune Pascal. Bensimhon, MDDATE OF BIRTH:  1946/07/23   DATE OF ADMISSION:  08/15/2008  DATE OF DISCHARGE:                              HISTORY & PHYSICAL   Mr. Sear is following for an emergent cardiac catheterization.   HISTORY OF PRESENT ILLNESS:  This is a 65 year old Caucasian male  patient of Dr. Sherren Mocha with known history of CAD, coronary artery  bypass grafting, LIMA to LAD, SVG to PDA, and SVG to diagonal along with  SVG to OM in May 2009 who has been feeling well until this past Saturday  approximately 5 days ago while he was out walking in a tourist facility  in [place], [place].  He ate two hot dogs and began to have some  chest pressure that lasted a couple of hours.  He thought it was related  to heartburn.  From [place], the patient drove to [place], [place], to visit his son.  The patient states that he used his sons  elliptical for about 45 seconds just to try it out and the chest pain  worsened.  He took a nitroglycerin and the pain was relieved  immediately.  That evening, the patient drove home to [facility] and the  patient began to have some chest pressure again while driving.  He  states that the pain comes and goes in intensity, although it was there  the majority of the trip home to [place].  The patient came home late  and around 3:00 a.m., he took a nitroglycerin sublingual and the pain  was relieved.  The following morning, he called [facility],  which was Sunday, and spoke with Rosanne Sack, nurse practitioner.  The patient had no further recurrences of chest pain at that time and  Ms. Stevie Kern made sure he had a followup appointment early this week to  speak with primary cardiologist, Dr. [name].  The following day, the  patient  did have another episode of chest discomfort after walking from  his shop to his home and took another nitroglycerin and it was relieved.  He followed up with Dr. Sherren Mocha on Tuesday, August 12, 2008, and  expressed symptoms to him.  The patient's Lopressor was increased to 75  mg b.i.d. and was scheduled for an exercise stress Myoview to rule out  significant ischemia and this was scheduled for Monday, August 18, 2008.  The patient had again recurrence of chest pain this morning while  taking a shower, which he described this pain and pressure very similar  to the pain that he had prior to his bypass grafting, although not as  intense.  Took another nitroglycerin and called [facility] and  was advised to come to the hospital to be admitted.  The patient is  currently without any complaints of chest pain and has had no complaints  of chest pain since taking a shower this morning.  REVIEW OF SYSTEMS:  Positive for chest pain, chest pressure with  exertion.  Denies any nausea, vomiting, diarrhea, dyspnea, or  diaphoresis.   PAST MEDICAL HISTORY:  1. Non-ST elevated MI in May 2009 with subsequent cardiac      catheterization revealing critical left main stenosis leading to      coronary artery bypass grafting.  2. CAD.      a.     Status post LIMA to LAD, SVG to PDA, SVG to diagonal, SVG to       OM in May 2009.  3. Hypertension.  4. Dyslipidemia.  5. Borderline diabetes.   SOCIAL HISTORY:  The patient lives in [place] with his wife.  He does  not smoke.  He does not drink alcohol.  There is no drug use.   FAMILY HISTORY:  Mother deceased from complications of CHF.  Father  deceased from unknown cause.  He has a half-sister who is in good  health.   CURRENT MEDICATIONS:  1. Lipitor 20 mg daily.  2. Plavix 75 mg daily.  3. Benazepril 40 mg daily.  4. Aspirin 81 mg daily.  5. Lopressor 75 mg b.i.d.  6. Nitroglycerin p.r.n.   ALLERGIES:  No known drug  allergies.   LABS:  Pending as they have been ordered stat.  EKG is pending.   PHYSICAL EXAMINATION:  VITAL SIGNS:  Blood pressure 122/79, pulse 75,  respirations 18, temperature 97.7, and O2 sat 99% on room air.  HEENT:  Head is normocephalic and atraumatic.  Eyes:  PERRLA.  Mucous  membranes pink and moist.  Tongue is midline.  NECK:  Supple without JVD or carotid bruits appreciated.  CARDIOVASCULAR:  Regular rate and rhythm without murmurs, rubs, or  gallops.  Pulses are 2+ and equal without bruits.  LUNGS:  Clear to auscultation.  ABDOMEN:  Soft and nontender, 2+ bowel sounds.  EXTREMITIES:  Without clubbing, cyanosis, or edema.  NEURO:  Cranial nerves II-XII are grossly intact.   IMPRESSION:  1. Angina.  Chest pain similar to the pain the patient had prior to      bypass grafting ongoing over the last 5 days with exertion and      relieved with nitroglycerin.  2. Known history of coronary artery disease, status post five-vessel      coronary artery bypass grafting in May 2009.  3. Hypertension.  4. Hyperlipidemia.   PLAN:  This is a 65 year old Caucasian male well-known to our practice  is a patient of Dr. Sherren Mocha with known history of CAD and  coronary artery bypass grafting in May 2009 with ongoing exertional  chest pain for the last 5 days relieved with nitroglycerin.  The patient  had another episode this morning in the shower prompting call to [facility] and has been advised to be admitted.  This has been discussed  with Dr. Glori Bickers who  plans cardiac catheterization today for definitive evaluation of  coronary artery graft and native vessel in the setting of recurrent  angina.  In the interim, the patient will be kept n.p.o.  Stat labs will  be completed and catheterization will be done making further  recommendations based upon catheterization results.      Phill Myron. Purcell Nails, NP      Victoria Bensimhon, MD  Electronically  Signed    KML/MEDQ  D:  08/15/2008  T:  08/16/2008  Job:  [id number]   cc:   [name], M.D.

## 2011-03-01 NOTE — H&P (Signed)
NAMESERENITY, [name]                 ACCOUNT NO.:  [id number]   MEDICAL RECORD NO.:  [id number]          PATIENT TYPE:  OIB   LOCATION:  2915                         FACILITY:  [facility]   PHYSICIAN:  Shaune Pascal. Bensimhon, MDDATE OF BIRTH:  [birth year]   DATE OF ADMISSION:  03/05/2008  DATE OF DISCHARGE:                              HISTORY & PHYSICAL   SUMMARY OF HISTORY:  [name] is a 65 year old white male who is  transported via EMS to [facility] secondary to acute inferior  EKG changes.  [name] was recently discharged on Mar 03, 2008, post 5-  vessel bypass grafting and acute coronary syndrome.  He stated he has  done well until this morning.  At approximately 10:30 after taking his  aspirin and drinking coffee, he developed nausea associated with  diaphoresis.  He checked his blood pressure and he stated it was 60  systolically.  He denies loss of consciousness; however, his wife  informed him that he did lose consciousness for a second, but the  patient does not recall this.  He did not lose bowels or urine or  sustained any injuries.  Wife called the EMS, and upon their arrival,  verbal report is that his systolic blood pressure was approximately 80.  An EKG performed by EMS at 10:49, showed normal sinus rhythm, inferior  ST-segment elevation.  An EKG at [facility] ER at approximately 11:32  confirmed finding, thus he was taken directly to the catheterization  lab.  With this episode, he did not have any associated chest discomfort  or shortness of breath.   PAST MEDICAL HISTORY:  No known drug allergies.   MEDICATIONS:  According to the discharge summary, the patient was  discharged home on:  1. Aspirin 325 mg daily.  2. Lipitor 20 nightly  3. Lasix 40 mg daily.  4. K-Dur 20 mEq daily, with the last dose of his Lasix and potassium      scheduled for Mar 08, 2008.  5. Lopressor 50 mg b.i.d.  6. Tylox p.r.n.   However, the bag of medications that accompanied the patient  hold  prescription bottles for Lopressor 25 mg 3 tablets b.i.d., benazepril 40  mg daily and aspirin 81 mg daily.  It is not clear which dose of  Lopressor he is taking, which dose of aspirin he is taking, or if he is  taking benazepril at this time.   PAST MEDICAL HISTORY:  Notable for:  1. Hypertension.  2. There is a history of borderline diabetes, however, on Feb 27, 2008, his hemoglobin A1c was elevated at 7.5, but the patient is      not on any medication.  3. He has a history of hyperlipidemia.  On Feb 27, 2008, total      cholesterol was 165, triglycerides 117, HDL was low at 26, and LDL      was elevated at 116.  4. On Feb 26, 2008, he presented to [facility] with acute coronary      syndrome.  Catheterization revealed a 3-vessel coronary  artery      disease with an EF of approximately 50%, anterior hypokinesis.  He      was taken urgently to the operating room on Feb 27, 2008, underwent      5-vessel bypass grafting with the LIMA to the LAD, saphenous vein      graft to the PDA, saphenous vein graft to the diagonal, saphenous      vein graft to the ramus and OM.  Postoperatively, he had some      problems with hypotension and blood loss.   SURGICAL HISTORY:  Otherwise, is notable for cholecystectomy,  herniorrhaphy and TNI.   SOCIAL HISTORY:  Remains unchanged from his H&P on Feb 26, 2008.  He  resides in [place] with his wife.  Employed in [employer].  He denies  tobacco, alcohol or drug usage.   FAMILY HISTORY:  Noncontributory for premature cardiovascular disease.   REVIEW OF SYSTEMS:  Otherwise unremarkable for the above, although he  does have bruising at the prior cath site and he does occasionally have  bright red blood per rectum secondary to hemorrhoids.   PHYSICAL EXAMINATIONS:  VITAL SIGNS:  In the cath lab are blood pressure  136/85, pulse was 88, and respirations approximately 20.  HEENT:  Unremarkable.  NECK:  Supple without thyromegaly,  adenopathy, JVD, or carotid bruits.  CHEST:  Symmetrical excursion.  Clear to auscultation anterolaterally.  HEART:  PMI is not displaced.  Distant heart sounds regular rate and  rhythm.  I did not appreciate any murmurs, rubs, clicks, or gallops.  All pulses are symmetrical and intact.  I did not appreciate any  abdominal or femoral bruits.  SKIN:  Integument is intact with bruising at the prior cath site.  ABDOMEN:  Obese.  Bowel sounds present without organomegaly, masses, or  tenderness.  EXTREMITIES:  Negative cyanosis, clubbing, or edema.  MUSCULOSKELETAL:  Unremarkable.  NEURO:  Unremarkable.   Chest x-ray and labs are pending at the time of this dictation.   IMPRESSION:  1. ST-elevated myocardial infarction inferiorly.  2. Hypotension with possible syncope, preempting above.  3. Status post 5-vessel bypass surgery on Feb 27, 2008, as previously      described.  4. Diabetes with an elevated hemoglobin A1c on Feb 27, 2008, of 7.5.      History as noted per past medical history.   DISPOSITION:  The patient has been taken emergently to the cardiac  catheterization laboratory.  We will continue his home medications on  admission.  Postcatheterization, his medications will be reviewed to  discern exactly what  he is taking at home.  Future recommendations will be based on findings  of the cardiac catheterization.  During this hospitalization, he should  also receive diabetes education and consideration of beginning an oral  antihyperglycemic agent given his elevated hemoglobin A1c during his  last admission.      Sharyl Nimrod, PA-C      Shaune Pascal. Bensimhon, MD  Electronically Signed    EW/MEDQ  D:  03/05/2008  T:  03/06/2008  Job:  [id number]   cc:   Jeryl Columbia, M.D.  [name]. [name], MD  Lanelle Bal, MD  [name], M.D.

## 2011-03-01 NOTE — Cardiovascular Report (Signed)
[name] [name], [name] [name]                 ACCOUNT NO.:  [id number]   MEDICAL RECORD NO.:  [id number]          PATIENT TYPE:  INP   LOCATION:  6527                         FACILITY:  [facility]   PHYSICIAN:  Vanna Scotland. Olevia Perches, MD, FACCDATE OF BIRTH:  [birth year]   DATE OF PROCEDURE:  08/15/2008  DATE OF DISCHARGE:                            CARDIAC CATHETERIZATION   HISTORY:  Mr. Ingle is a 65 years old and underwent bypass surgery in  May 2009.  He was admitted a few weeks after his bypass surgery with a  non-ST-elevation myocardial infarction due to thrombotic partial  occlusion of the distal limb of the saphenous vein graft to the marginal  and posterolateral branches of the circumflex artery.  This was treated  medically.  He did well after that until the last week when he has  developed exertional chest pain.  This became worse and he came to the  emergency room and was seen by Dr. Haroldine Laws and is scheduled for  evaluation with angiography.   PROCEDURE:  The procedure was performed by the right femoral artery  using arterial sheath and 5-French preformed coronary catheters.  After  completion of the diagnostic study and review of the old films, we made  a decision to see with intervention on the lesion at the anastomosis of  the saphenous vein graft to the second marginal branch of the circumflex  artery.   The patient was given antiemetics bolus and infusion.  He was given  additional 300 mg of Plavix and he was given four chewable aspirin.  We  used an AL-1 6-French guiding catheter with side holes.  We crossed the  lesion at the anastomosis of the vein graft to the marginal branch of  the circumflex artery with a PT 2 wire without too much difficulty.  There was a trickle of flow down the distal limb of this vein graft,  which fed the posterolateral branch but this was occluded before its  insertion in the posterolateral branch.  We predilated the lesion with a  2.0 x 15-mm apex balloon  performing 3 inflations up to 12 atmospheres  for 30 seconds.  We then deployed a 2.25 x 8-mm Taxus stent.  The stent  extended into the marginal branches before bifurcation and ended just at  the anastomosis of the vein graft before the vein graft became much  larger in caliber.  We deployed this with one inflation of 12  atmospheres for 30 seconds.  We then postdilated with a 2.5 x 8-mm [place]  Voyager avoiding the distal edge and performing one inflation up to 15  atmospheres for 30 seconds.  Final diagnosis was informed through the  guiding catheter.  The patient tolerated the procedure well and left the  laboratory in satisfactory condition.   RESULTS:  Left main coronary artery:  The left main coronary artery was  free of disease.   Left anterior descending artery.  The left anterior descending artery  was completely occluded at its origin.   Circumflex artery:  The circumflex artery was completely occluded at the  very small marginal  and atrial branch.   Right coronary:  The right coronary had severe disease with 80%  narrowing in the proximal vessel and 90% narrowing in the distal vessel  with competing flow distally.   The saphenous vein graft to the distal right coronary was patent and  functioned normally.  This had a posterior descending and two  posterolateral branches.  There was 70% narrowing in the posterior  descending branch.   The saphenous vein graft to the diagonal branch LAD was patent and  functioned normally.   The LIMA graft to the LAD was patent and functioned normally, although  distal LAD was a small-caliber vessel with diffuse irregularities.  The  saphenous vein graft to the marginal and posterolateral branch of  circumflex artery was occluded in its distal limb.  There was a 95%  stenosis at the anastomosis to the marginal branch.   Left ventricle:  The left ventriculogram performed on RAO projection  showed good wall motion with no areas of  hypokinesis.  The estimated  ejection fraction of 60%.   The aortic pressure was 136/72 with mean of 101.  Left ventricle  pressure was 136/7.   Following stenting of the anastomotic lesion of the saphenous vein graft  to the marginal branch of circumflex artery stenosis improved from 95%  to 0% and the flow improved from TIMI II to TIMI III flow.   CONCLUSION:  1. Coronary artery disease, status post coronary artery bypass graft      surgery in May 2009.  2. Severe native vessel disease with total occlusion of LAD and      circumflex artery and 80% proximal and 90% distal stenosis in the      right coronary artery.  3. Patent vein graft to the distal right coronary, patent vein graft      to the diagonal branch LAD, patent LIMA graft to LAD, and occluded      sequential vein graft to the marginal and posterolateral branch of      the circumflex artery in its distal limb with 95% stenosis at the      anastomosis to the marginal branch.  4. Normal LV function with estimated ejection fraction of 60%.  5. Successful PCI of the anastomotic lesion of the saphenous vein      graft to the marginal branch of the circumflex artery using a Taxus      drug-eluting stent with improvement center narrowing from 95% to      0%.   DISPOSITION:  The patient was returned to the post angio suite for  further observation.  I will get enzymes now and in the morning.  If he  did not have any infarct, he may be able to go home in the morning.      Bruce Alfonso Patten Olevia Perches, MD, [facility]  Electronically Signed     BRB/MEDQ  D:  08/15/2008  T:  08/16/2008  Job:  [id number]   cc:   [name]. [name], MD  [name], M.D.

## 2011-03-01 NOTE — Discharge Summary (Signed)
NAMECASY, [name] [name]                 ACCOUNT NO.:  [id number]   MEDICAL RECORD NO.:  [id number]          PATIENT TYPE:  INP   LOCATION:  6527                         FACILITY:  [facility]   PHYSICIAN:  Thompson Grayer, MD       DATE OF BIRTH:  11/17/1945   DATE OF ADMISSION:  08/15/2008  DATE OF DISCHARGE:  08/16/2008                               DISCHARGE SUMMARY   PRIMARY CARDIOLOGIST:  [name]. [name], MD   PRIMARY DIAGNOSIS:  1. Unstable angina pectoris.      a.     Status post Taxus drug-eluting stenting of high-grade       saphenous vein graft - obtuse marginal graft stenosis at       anastomosis site.   SECONDARY DIAGNOSES:  1. Coronary artery disease.      a.     Status post ST-segment elevation myocardial infarction, May       2009, subsequent four-vessel coronary artery bypass grafting       secondary to critical left main stenosis.  2. Preserved left ventricular function.  3. Hypertension.  4. Dyslipidemia.  5. Diabetes mellitus, diet-controlled.   HOSPITAL COURSE:  The patient underwent same day cardiac catheterization  following presentation with symptoms of worrisome for unstable angina  pectoris.  He underwent successful percutaneous intervention by Dr.  Eustace Quail with placement of a Taxus drug-eluting stent to the SVG-OM  graft at the anastomosis site with no noted complications.  Residual  anatomy notable for severe native CAD and continued patency of the LIMA-  LAD, SVG-diagonal, and SVG-RCA grafts.  Left internal function was  normal.   Cardiac markers notable for normal MBs with troponins of 0.22 and 0.23.   The patient was hemodynamically stable without complaint of chest pain  at the time of discharge.  No noted complications of the right groin  incision site.   DISPOSITION:  Stable.   DISCHARGE MEDICATIONS:  1. Plavix 75 mg daily.  2. Coated aspirin 325 mg daily.  3. Metoprolol tartrate 75 mg b.i.d.  4. Lipitor 20 daily.  5. Benazepril 40 daily.  6. Zocor 40 mg p.r.n.   FOLLOWUP:  The patient will follow up with Dr. Sherren Mocha in 3  weeks.  Arrangements to be made through our office.   DICTATION DURATION TIME:  Greater than 30 minutes.   DISCHARGE LABS:  Normal CBC, potassium 2.6, BUN 14, creatinine 1.2, CPK  62/1.9, troponin I 0.23.  Lipid profile:  Total cholesterol 118,  triglyceride 132, HDL 23, and LDL 69.      Gene Serpe, PA-C      Thompson Grayer, MD  Electronically Signed    GS/MEDQ  D:  08/16/2008  T:  08/16/2008  Job:  [id number]   cc:   [name], M.D.

## 2011-03-01 NOTE — Discharge Summary (Signed)
[name], [name] [name]                 ACCOUNT NO.:  [id number]   MEDICAL RECORD NO.:  [id number]          PATIENT TYPE:  INP   LOCATION:  2018                         FACILITY:  [facility]   PHYSICIAN:  Lanelle Bal, MD    DATE OF BIRTH:  1945/12/12   DATE OF ADMISSION:  02/26/2008  DATE OF DISCHARGE:  03/03/2008                               DISCHARGE SUMMARY   HISTORY OF PRESENT ILLNESS:  The patient is a 65 year old white male  without a previous history of coronary artery disease, who presented  with exertional chest pain for approximately 3 weeks.  This has been  progressive tightness in nature.  The symptoms occur with exertion.  It  would discontinue with rest.  They have become more intense in nature.  The patient presented to the emergency department initially pain free,  but the symptoms did recur.  He also noticed a significant lack of  energy in the past several weeks.  He was felt to require further  evaluation and treatment and was admitted for symptoms concerning for  unstable angina.  An EKG was obtained, which showed ST depressions  consistent with ischemic change.  Initial troponin I was negative.  Chest x-ray showed no acute disease.  The patient was admitted for plane  cardiac catheterization.   PAST MEDICAL HISTORY:  Includes:  1. Hypertension.  2. History of hemorrhoids.   PAST SURGICAL HISTORY:  Includes:  1. Cholecystectomy.  2. Herniorrhaphy.  3. Tonsillectomy.   ALLERGIES:  No known drug allergies.   MEDICATIONS:  Prior to admission included:  1. Hydrochlorothiazide 20 mg daily.  2. Benazepril 40 mg daily.   Family history, social history, review of symptoms, and physical exam,  please see the history and physical done at the time of admission.   HOSPITAL COURSE:  The patient was admitted.  He eventually did rule in  for myocardial infarction by enzymes.  Peak troponin I was measured at  0.31.  The patient underwent procedure on Feb 27, 2008, and was  found to  have severe multivessel coronary artery disease.  He had 75% mid lesion  in the left main coronary artery.  Additionally, he had significant  stenosis in the LAD, ramus, obtuse marginal II, and right posterior  descending.  An emergent consultation was obtained by Dr. Lanelle Bal for surgical consultation due to the critical anatomy and  positive enzymes with ongoing chest pain.  He was felt to be a candidate  for emergent surgical revascularization and was taken to the operating  room.  Procedure, Feb 27, 2008, coronary artery bypass grafting x5  emergent.  The following grafts were placed:  1. Left internal mammary artery to the LAD.  2. Saphenous vein graft to the posterior descending.  3. Saphenous vein graft to the diagonal.  4. Sequential saphenous vein graft to the intermediate and obtuse      marginal.   The patient tolerated the procedure well and was taken to the surgical  intensive care unit in stable condition.   POSTOPERATIVE HOSPITAL COURSE:  The patient has done well.  He  was  extubated without difficulty.  He was weaned from his inotropic support  without difficulty.  All routine lines, monitors, and drainage devices  were discontinued in a standard fashion.  He does have a moderate  postoperative anemia.  His laboratory values have stabilized.  His most  recent hemoglobin and hematocrit dated Mar 01, 2008, are 10 and 28  respectively.  Electrolytes, BUN, and creatinine are within normal  limits.  He has been weaned from oxygen and maintained good saturations  on room air.  He has had a fairly persistent sinus tachycardia  postoperatively, and his beta-blocker has been adjusted.  He has  responded well to a gentle diuresis.  Overall, his status is felt to be  tentatively stable for discharge in the morning of Mar 03, 2008, pending  morning round reevaluation.   INSTRUCTIONS:  The patient received written instructions in regard to  medications,  activity, diet, wound care, and followup.  Follow up will  include Dr. [name] in 2 weeks and Dr. Servando Snare in 3 weeks.   MEDICATIONS ON DISCHARGE:  Include the following:  1. Aspirin 325 mg daily.  2. Lipitor 20 mg daily.  3. Lasix 40 mg daily for 5 days.  4. K-Dur 20 mEq daily for 5 days.  5. Lopressor 50 mg twice daily.  6. Tylox 1 or 2 every 4-6 hours as needed for pain.   CONDITION ON DISCHARGE:  Stable and improving.   FINAL DIAGNOSES:  Unstable angina with critical left main and 3-vessel  coronary artery disease with admission enzyme-positive myocardial  infarction.  Now, status post surgical revascularization emergently as  described.   OTHER DIAGNOSES:  Include:  1. Acute blood loss anemia.  2. Hypertension.  3. History of previous surgeries as listed above.      [name], P.A.-C.      Lanelle Bal, MD  Electronically Signed    WEG/MEDQ  D:  03/02/2008  T:  03/03/2008  Job:  [id number]   cc:   [name], MD  [name]. [name], MD  [name], M.D.  Lanelle Bal, MD

## 2011-03-01 NOTE — Op Note (Signed)
NAMECHUNG, [name] [name]                 ACCOUNT NO.:  [id number]   MEDICAL RECORD NO.:  [id number]          PATIENT TYPE:  INP   LOCATION:  2018                         FACILITY:  [facility]   PHYSICIAN:  Lanelle Bal, MD    DATE OF BIRTH:  May 28, 1946   DATE OF PROCEDURE:  02/27/2008  DATE OF DISCHARGE:                               OPERATIVE REPORT   PREOPERATIVE DIAGNOSIS:  Unstable angina with critical left main/left  anterior descending anatomy, coronary artery.   PROCEDURE PERFORMED:  1. Urgent coronary artery bypass grafting x5 with the left internal      mammary to the left anterior descending coronary artery.  2. Reverse saphenous vein graft to the diagonal coronary artery.  3. Sequential reverse saphenous vein graft to the intermediate      coronary artery and distal circumflex.  4. Reverse saphenous vein graft to the distal right coronary artery      with right leg endovein harvesting.   SURGEON:  Lanelle Bal, MD   FIRST ASSISTANT:  Faylene Million. Dominick, PA   BRIEF HISTORY:  The patient is a 65 year old male who presented with  unstable anginal symptoms, was admitted and stabilized medically,  underwent cardiac catheterization.  He had had just sliding onto the  catheterization table.  He was having anginal symptoms.  At the time of  catheterization by Dr. [name], he was found to have totally occluded  intermediate coronary artery at distal circumflex that was small, but  involved with a proximal stenosis in the left main.  Just at the take-  off of the circumflex was a large complicated greater than 90% stenosis  involving the LAD.  He had luminal irregularities and 60% stenosis of  the proximal right coronary artery.  Because of the patient's ongoing  chest pain and very critical left main anatomy, and proximal LAD, an  emergency coronary artery bypass grafting was recommended.  The patient  agreed and signed informed consent.   DESCRIPTION OF PROCEDURE:  With  Swan-Ganz and arterial line monitors in  place, the patient was taken directly from the cath lab to the operating  room.  He underwent general anesthesia without incident.  The skin, the  chest, and legs were prepped with Betadine and draped in the usual  sterile manner.  Using the [employer] endovein harvesting system, vein was  harvested from the right thigh and calf, and was of good quality.  Median sternotomy was performed.  The left internal mammary artery was  dissected down with pedicle graft.  The distal artery was divided and  had good free flow.  Pericardium was opened.  Overall ventricular  function appeared preserved.  The patient was systemically heparinized.  The right atrium was cannulated, aortic root vent cardioplegia needle  was introduced into the ascending aorta.  The patient was placed on  cardiopulmonary bypass 2.4 L/min/m2.  Sites of anastomosis were selected  and dissected out of the epicardium.  The patient's diagonal vessel,  intermediate vessel, and distal circumflex were all small, less than  approximately 1.2 mm in size.  The LAD was larger, but  diffusely  diseased.  The heart was elevated, and attention was turned first to the  intermediate and distal circumflex.  The intermediate was partially  intramyocardial, the vessel was dissected out, an open 1-mm probe passed  distally using a diamond-type side-to-side anastomosis to the  intermediate vessel.  The same vein was then carried to the distal  circumflex vessel which was also small, admitted approximately 1.2 mm in  size using running 7-0 Prolene distal anastomosis was performed.  Attention was then turned to the diagonal coronary artery, which was  opened and which was diffusely diseased and also 1.2 mm in size using a  running 7-0 Prolene.  Attention was then turned to the distal right  coronary artery, which was opened, was larger vessel and somewhat thick.  Using a running 7-0 Prolene, distal anastomosis  was performed.  Additional cold blood cardioplegia was administered down the vein  grafts.  Attention was then turned to the left anterior descending  coronary artery in the midportion of the vessel using a running 8-0  Prolene, the left internal mammary artery was anastomosed to the left  anterior descending coronary artery.  With crossclamp still in place, 3-  punch aortotomies were performed, each with 3 vein grafts anastomosed to  the ascending aorta.  Air was evacuated from the grafts and the aortic  cross clamp was removed.  Total crossclamp time of 100 minutes.  Sites  of anastomosis were inspected with free of bleeding.  The patient was  spontaneously converted to a sinus rhythm.  He was then ventilated and  weaned off cardiopulmonary bypass without difficulty, remained  hemodynamically stable.  He was decannulated in the usual fashion.  Protamine sulfate was administered with operative field hemostatic, two  atrial and two ventricular pacing wires were applied.  Graft markers  applied.  Left pleural tube and a Blake mediastinal tube were left in  place.  Sternum was closed with #6 stainless steel wire.  Fascia closed  with interrupted 0 Vicryl, running 3-0 Vicryl, subcutaneous tissue, and  4-0 subcuticular stitch in skin edges.  Dry dressings were applied.  Sponge and needle count was reported as correct.  The patient was  transferred to surgical intensive care unit for further postoperative  care, having tolerated procedure without obvious complication, and total  pump time was 150 minutes.      Lanelle Bal, MD  Electronically Signed     EG/MEDQ  D:  02/29/2008  T:  03/01/2008  Job:  [id number]   cc:   [name]. [name], MD

## 2011-03-01 NOTE — Assessment & Plan Note (Signed)
[facility]                            CARDIOLOGY OFFICE NOTE   NAME:[name], [name] YEAGLEY                        MRN:          [id number]  DATE:09/10/2008                            DOB:          02-Feb-1946    REASON FOR VISIT:  Post-hospital followup after PCI.   HISTORY OF PRESENT ILLNESS:  [name] [name] is a 65 year old gentleman who  underwent coronary bypass surgery in May 2009.  He was diagnosed with  critical left main stenosis at the time of diagnostic catheterization  after presenting initially with a non-ST elevation MI.  He presented a  few weeks back with recurrent angina.  He was set up for a Myoview  stress scan, but developed symptoms of unstable angina prior to the  scan.  He was admitted to the hospital and underwent cardiac cath  demonstrating severe stenosis at the anastomotic site of saphenous vein  graft to OM branch.  He was treated with Taxus drug-eluting stent and  has had complete resolution of his symptoms since hospital discharge on  August 16, 2008.  He feels well at present.  He has resumed his normal  activities and currently denies chest pain, dyspnea, edema,  lightheadedness, syncope, palpitations, orthopnea, or PND.  The patient  has no complaints at present.  He has had some problems with right leg  claudication, but this has resolved as well.   CURRENT MEDICATIONS:  1. Lipitor 20 mg daily.  2. Plavix 75 mg daily.  3. Benazepril 40 mg daily.  4. Metoprolol 50 mg one-and-half b.i.d.  5. Aspirin 81 mg daily.   ALLERGIES:  NKDA.   PHYSICAL EXAMINATION:  GENERAL:  The patient is alert and oriented.  He  is in no acute distress.  VITAL SIGNS:  Weight is 192 pounds, blood pressure is 120/80, heart rate  60, and respiratory rate 16.  HEENT:  Normal.  NECK:  Normal carotid upstrokes.  No bruits.  JVP normal.  LUNGS:  Clear bilaterally.  HEART:  Regular rate and rhythm.  No murmurs or gallops.  ABDOMEN:  Soft and nontender.   No organomegaly.  EXTREMITIES:  No clubbing, cyanosis, or edema.  Femoral pulses are 2+,  DP and PT pulses are 2+ on the left and nonpalpable on the right.   EKG shows normal sinus rhythm with sinus arrhythmia, otherwise within  normal limits.   ASSESSMENT:  1. Coronary artery disease, status post coronary artery bypass graft      and recent PCI as outlined above.  The cardiac catheterization on      August 15, 2008, demonstrated patent vein grafts to the distal      right coronary artery, diagonal branch of the left anterior      descending as well as the patent left internal mammary artery to      left anterior descending .  As outlined, he underwent intervention      involving a distal limb of the sequence graft to the obtuse      marginal branches of the left circumflex.  The patient is doing  well with no symptoms of angina at present.  His left ventricular      function is preserved with an ejection fraction of 60%.  He should      be continued on dual-antiplatelet therapy long-term.  We will      continue aggressive secondary risk reduction (see below for      details.)  2. Hyperlipidemia.  The patient remains on atorvastatin.  Lipid panel      during his recent hospitalization showed a cholesterol of 118,      triglycerides 132, HDL 23, and LDL 69.  We will consider HDL-      raising therapy in the future.  For now, maintain on Lipitor.  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  The      patient's symptoms have resolved with his walking program.  His      ankle-brachial indexes from July were 0.73 on the right and 1.1 on      the left.  His lower extremity duplex scan showed occlusion of the      distal popliteal artery into the tibioperoneal trunk on the right.   For followup, I would like to see [name] [name] back in 3 months.     [name]. [name], MD  Electronically Signed    MDC/MedQ  DD: 09/13/2008  DT: 09/13/2008  Job #: [id number]   cc:    [name], M.D.

## 2011-03-01 NOTE — Assessment & Plan Note (Signed)
[facility] OFFICE NOTE   NAME:[name] [name]                        MRN:          [id number]  DATE:04/08/2008                            DOB:          Apr 19, 1946    [name] [name] returns for followup to [facility] office on April 08, 2008.  [name] [name] is a 65 year old gentleman who was hospitalized  most recently in May 2007 because of nausea, diaphoresis, and  lightheadedness.  EMS was called and EKG was suggestive of an ST-  elevation MI.  Emergent catheterization demonstrated patent bypass  grafts and the patient ultimately ruled out for myocardial infarction.  [name] [name] was thought to be volume depleted as well as his antihypertensive and  diuretic medications were adjusted.  His hydrochlorothiazide was  discontinued.  [name] [name] initial hospitalization was initially Feb 26, 2008, when [name] [name] was hospitalized for classic unstable angina.  [name] [name]  underwent catheterization that showed critical left main stenosis and [name] [name]  was treated with emergency coronary bypass surgery.   At present, [name] [name] is doing much better.  [name] [name] has had no further episodes of  chest pain, dyspnea, lightheadedness, or syncope.  [name] [name] has developed some  right leg pain with ambulation.  This occurs mainly in the calf muscles  with approximately 200 feet of walking.  [name] [name] has no other complaints at  this time.   MEDICATIONS:  1. Lipitor 20 mg daily.  2. Aspirin 81 mg 3 times daily.  3. Metoprolol 50 mg 3 times daily.  4. Plavix 75 mg daily.  5. Benazepril 40 mg daily.   ALLERGIES:  NKDA.   PHYSICAL EXAMINATION:  GENERAL:  The patient is alert and oriented.  [name] [name]  is in no acute distress.  VITAL SIGNS:  Weight is 206 pounds, blood pressure is 118/76, heart rate  is 79, and respiratory rate is 16.  HEENT:  Normal.  NECK:  Normal carotid upstrokes without bruits.  Jugular venous pressure  is normal.  LUNGS:  Clear to auscultation bilaterally.  HEART:   Regular rate and rhythm without murmurs or gallops.  ABDOMEN:  Soft and nontender.  No organomegaly.  EXTREMITIES:  Femoral pulses are 2+ bilaterally.  Dorsalis pedis pulses  are trace bilaterally.  Posterior tibial pulses 2+ on the left and 1+ on  the right.  Skin is warm and dry without rash.  There is no edema.   EKG shows normal sinus rhythm with inferior T-wave abnormality,  suggestive of inferior ischemia.   ASSESSMENT:  1. Coronary artery disease, status post coronary artery bypass      grafting.  Continue current medical therapy, which includes dual      antiplatelet therapy with aspirin and Plavix, lipid-lowering      treatment with Lipitor, and metoprolol as well as benazepril.  [name] [name]      is encouraged to initiate cardiac rehab.  A previous referral has      been made since his bypass surgery.  [name] [name] can continue with his      walking program.  2. Dyslipidemia.  Continue Lipitor.  Followup lipids and LFTs in 2      months.  LDL goal is less than 70.  3. Intermittent claudication.  His symptoms are fairly typical for      claudication.  [name] [name] has had 2 catheterization procedures from the      right femoral artery.  It is possible that [name] [name] has procedure-related      stenosis in that area versus de novo stenosis somewhere else.  We      will check lower extremity ABIs and duplex scan.   I would like to see [name] [name] back in 2 months.  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  I think [name] [name] is ready for that and can  return on next week.  [name] [name] also would like to undergo ear surgery some  time before the end of the year.  I think it would be reasonable to  proceed with that and now that [name] [name] has had revascularization with bypass  surgery.  It is now acceptable to temporarily hold his antiplatelet  agents to have that surgery if necessary.     [name]. [name], MD  Electronically Signed    MDC/MedQ  DD: 04/08/2008  DT: 04/09/2008  Job #: [id number]   cc:   [name],  M.D.

## 2011-03-01 NOTE — Assessment & Plan Note (Signed)
OFFICE VISIT   [name] [name], [name] [name]  DOB:  09-23-1946                                        March 24, 2008  CHART #:  [id number]   HISTORY OF PRESENT ILLNESS:  The patient is a 65 year old male who is  status post urgent coronary artery bypass grafting x5 on Feb 27, 2008,  by Dr. Servando Snare.  He presented with unstable angina and a critical left  main with LAD coronary artery disease.  He reports that overall he is  making very good progress.  His energy level and ability to ambulate  increases daily.  He has not started the formal rehab program, but does  intend to do so.  He denies any difficulties with shortness of breath.  He denies any fevers, chills, or other constitutional symptoms.   CHEST X-RAY:  A chest x-ray was performed on today's date.  It reveals a  very small left effusion with some mild atelectasis or scarring in the  left upper lobe.  It is otherwise clear.  The wires are intact.  There  is no evidence of congestive failure.   PHYSICAL EXAMINATION:  Vital Signs:  Blood pressure 127/82, heart rate  113, respirations 16, and oxygen saturation 93%.  Note the patient has  been keeping a daily diary of his vital signs.  His heart rate typically  runs in the low 90s.  General:  This is a well-developed adult male in  no acute distress.  Pulmonary:  Reveals clear lungs throughout.  Cardiac:  Regular rate and rhythm.  Normal S1 and S2.  No gallops or  rubs.  He is tachycardic.  Abdomen:  Soft and nontender.  Extremities:  Trace edema of the right lower extremity.  Incisions are all healing  well without evidence of infection.   ASSESSMENT:  The patient is making continued good progress following his  surgery done on Feb 27, 2008.  He can resume driving.  He is encouraged  to continue with the cardiac rehabilitation program.  He is encouraged  to continue followup with his cardiologist and primary  care physicians for ongoing risk modifications.  From the  surgical  viewpoint, his progress is felt to be acceptable at this time for a  followup to be on a p.r.n. basis.   [name], P.A.-C.   [name] [name]  D:  03/24/2008  T:  03/25/2008  Job:  [id number]   cc:   Lanelle Bal, MD  Shaune Pascal. Bensimhon, MD

## 2011-04-18 ENCOUNTER — Other Ambulatory Visit: Payer: Self-pay | Admitting: Cardiovascular Disease

## 2011-05-02 ENCOUNTER — Other Ambulatory Visit: Payer: Self-pay | Admitting: *Deleted

## 2011-05-02 MED ORDER — CLOPIDOGREL BISULFATE 75 MG PO TABS: 75.0000 mg | ORAL_TABLET | Freq: Every day | ORAL | Status: DC

## 2011-07-13 LAB — CBC
HCT: 28 — ABNORMAL LOW
HCT: 28.8 — ABNORMAL LOW
HCT: 30.1 — ABNORMAL LOW
HCT: 31.3 — ABNORMAL LOW
Hemoglobin: 10 — ABNORMAL LOW
Hemoglobin: 10.3 — ABNORMAL LOW
Hemoglobin: 10.8 — ABNORMAL LOW
Hemoglobin: 9.7 — ABNORMAL LOW
MCHC: 33.7
MCHC: 34.2
MCHC: 34.4
MCHC: 34.6
MCHC: 34.6
MCV: 85.9
MCV: 86.2
MCV: 86.6
MCV: 86.6
MCV: 86.7
Platelets: 159
Platelets: 198
Platelets: 308
Platelets: 494 — ABNORMAL HIGH
RBC: 3.23 — ABNORMAL LOW
RBC: 3.32 — ABNORMAL LOW
RBC: 3.49 — ABNORMAL LOW
RBC: 3.61 — ABNORMAL LOW
RBC: 3.91 — ABNORMAL LOW
RDW: 13.6
RDW: 13.7
RDW: 13.8
RDW: 14.3
WBC: 14.7 — ABNORMAL HIGH
WBC: 14.9 — ABNORMAL HIGH
WBC: 16.8 — ABNORMAL HIGH

## 2011-07-13 LAB — BASIC METABOLIC PANEL
BUN: 12
BUN: 8
BUN: 8
CO2: 25
CO2: 25
CO2: 27
Calcium: 7.8 — ABNORMAL LOW
Calcium: 8.1 — ABNORMAL LOW
Calcium: 8.2 — ABNORMAL LOW
Calcium: 8.5
Chloride: 105
Chloride: 106
Chloride: 107
Creatinine, Ser: 0.93
Creatinine, Ser: 0.93
Creatinine, Ser: 1
Creatinine, Ser: 1.08
GFR calc Af Amer: >60
GFR calc Af Amer: >60
GFR calc Af Amer: >60
GFR calc Af Amer: >60
GFR calc non Af Amer: >60
GFR calc non Af Amer: >60
GFR calc non Af Amer: >60
GFR calc non Af Amer: >60
Glucose, Bld: 121 — ABNORMAL HIGH
Glucose, Bld: 129 — ABNORMAL HIGH
Glucose, Bld: 129 — ABNORMAL HIGH
Potassium: 3.9
Potassium: 3.9
Potassium: 4
Sodium: 138
Sodium: 141
Sodium: 141

## 2011-07-13 LAB — URINALYSIS, ROUTINE W REFLEX MICROSCOPIC
Bilirubin Urine: NEGATIVE
Glucose, UA: NEGATIVE
Ketones, ur: 15 — AB
Specific Gravity, Urine: 1.022
pH: 5.5

## 2011-07-13 LAB — COMPREHENSIVE METABOLIC PANEL
AST: 20
CO2: 24
Calcium: 8.5
Creatinine, Ser: 1.09
GFR calc Af Amer: >60
GFR calc non Af Amer: >60
Glucose, Bld: 111 — ABNORMAL HIGH

## 2011-07-13 LAB — CARDIAC PANEL(CRET KIN+CKTOT+MB+TROPI)
Relative Index: INVALID
Relative Index: INVALID
Troponin I: 0.04
Troponin I: 0.04

## 2011-07-13 LAB — PROTIME-INR
INR: 1.2
Prothrombin Time: 15.4 — ABNORMAL HIGH

## 2011-07-13 LAB — APTT: aPTT: 42 — ABNORMAL HIGH

## 2011-07-13 LAB — MAGNESIUM: Magnesium: 2.2

## 2011-07-18 LAB — APTT: aPTT: 30

## 2011-07-18 LAB — GLUCOSE, CAPILLARY
Glucose-Capillary: 140 — ABNORMAL HIGH
Glucose-Capillary: 151 — ABNORMAL HIGH

## 2011-07-18 LAB — PROTIME-INR
INR: 1
Prothrombin Time: 12.8

## 2011-07-18 LAB — LIPID PANEL
HDL: 23 — ABNORMAL LOW
Total CHOL/HDL Ratio: 5.1
Triglycerides: 132
VLDL: 26

## 2011-07-18 LAB — CBC
HCT: 48.3
Hemoglobin: 13.2
MCHC: 33.2
Platelets: 336
RBC: 4.64
RDW: 15.9 — ABNORMAL HIGH
WBC: 9.5

## 2011-07-18 LAB — BASIC METABOLIC PANEL
BUN: 16
CO2: 26
Calcium: 8.9
Calcium: 9.7
Creatinine, Ser: 0.96
Creatinine, Ser: 1.22
GFR calc Af Amer: >60
GFR calc non Af Amer: 60 — ABNORMAL LOW
GFR calc non Af Amer: >60
Glucose, Bld: 96
Potassium: 4.8
Sodium: 142

## 2011-07-18 LAB — CK TOTAL AND CKMB (NOT AT ARMC)
CK, MB: 1.9
Relative Index: INVALID
Relative Index: INVALID
Total CK: 62

## 2011-07-20 LAB — GLUCOSE, CAPILLARY: Glucose-Capillary: 193 — ABNORMAL HIGH

## 2011-07-29 ENCOUNTER — Other Ambulatory Visit: Payer: Self-pay | Admitting: Cardiovascular Disease

## 2011-08-31 ENCOUNTER — Other Ambulatory Visit: Payer: Self-pay | Admitting: Cardiovascular Disease

## 2012-01-09 ENCOUNTER — Other Ambulatory Visit: Payer: Self-pay | Admitting: Cardiovascular Disease

## 2012-02-21 ENCOUNTER — Encounter: Payer: Self-pay | Admitting: Cardiovascular Disease

## 2012-02-21 DIAGNOSIS — R7301 Impaired fasting glucose: Secondary | ICD-10-CM | POA: Diagnosis not present

## 2012-02-21 DIAGNOSIS — E7800 Pure hypercholesterolemia, unspecified: Secondary | ICD-10-CM | POA: Diagnosis not present

## 2012-02-21 DIAGNOSIS — Z79899 Other long term (current) drug therapy: Secondary | ICD-10-CM | POA: Diagnosis not present

## 2012-02-21 DIAGNOSIS — I10 Essential (primary) hypertension: Secondary | ICD-10-CM | POA: Diagnosis not present

## 2012-02-27 ENCOUNTER — Other Ambulatory Visit: Payer: Self-pay | Admitting: Cardiovascular Disease

## 2012-03-09 ENCOUNTER — Other Ambulatory Visit: Payer: Self-pay | Admitting: Cardiovascular Disease

## 2012-03-09 NOTE — Telephone Encounter (Signed)
..   Requested Prescriptions   Pending Prescriptions Disp Refills  . CRESTOR 20 MG tablet [Pharmacy Med Name: CRESTOR 20MG        TAB] 30 each 2    Sig: TAKE ONE TABLET BY MOUTH EVERY DAY  . clopidogrel (PLAVIX) 75 MG tablet 30 tablet 2    Sig: Take 1 tablet (75 mg total) by mouth daily.  called patient about making appointment

## 2012-04-10 DIAGNOSIS — Z09 Encounter for follow-up examination after completed treatment for conditions other than malignant neoplasm: Secondary | ICD-10-CM | POA: Diagnosis not present

## 2012-04-10 DIAGNOSIS — J3489 Other specified disorders of nose and nasal sinuses: Secondary | ICD-10-CM | POA: Diagnosis not present

## 2012-04-10 DIAGNOSIS — Z79899 Other long term (current) drug therapy: Secondary | ICD-10-CM | POA: Diagnosis not present

## 2012-04-10 DIAGNOSIS — H906 Mixed conductive and sensorineural hearing loss, bilateral: Secondary | ICD-10-CM | POA: Diagnosis not present

## 2012-04-10 DIAGNOSIS — R040 Epistaxis: Secondary | ICD-10-CM | POA: Diagnosis not present

## 2012-04-10 DIAGNOSIS — Z7982 Long term (current) use of aspirin: Secondary | ICD-10-CM | POA: Diagnosis not present

## 2012-05-07 ENCOUNTER — Encounter: Payer: Self-pay | Admitting: Cardiovascular Disease

## 2012-05-07 ENCOUNTER — Ambulatory Visit (INDEPENDENT_AMBULATORY_CARE_PROVIDER_SITE_OTHER): Payer: Medicare Other | Admitting: Cardiovascular Disease

## 2012-05-07 VITALS — BP 153/92 | HR 79 | Ht 69.0 in | Wt 218.0 lb

## 2012-05-07 DIAGNOSIS — I2510 Atherosclerotic heart disease of native coronary artery without angina pectoris: Secondary | ICD-10-CM

## 2012-05-07 DIAGNOSIS — I10 Essential (primary) hypertension: Secondary | ICD-10-CM | POA: Diagnosis not present

## 2012-05-07 DIAGNOSIS — I25810 Atherosclerosis of coronary artery bypass graft(s) without angina pectoris: Secondary | ICD-10-CM

## 2012-05-07 DIAGNOSIS — I70219 Atherosclerosis of native arteries of extremities with intermittent claudication, unspecified extremity: Secondary | ICD-10-CM

## 2012-05-07 MED ORDER — HYDROCHLOROTHIAZIDE 25 MG PO TABS: 25.0000 mg | ORAL_TABLET | Freq: Every day | ORAL | Status: DC

## 2012-05-07 NOTE — Progress Notes (Signed)
   HPI:  66 year old gentleman with CAD status post CABG, presenting for followup evaluation. He underwent coronary bypass surgery in 2009. He had early graft failure of one of his vein grafts and was treated with stenting at the distal anastomotic site. He subsequently has done quite well and presents today for routine followup. He underwent most recent cardiac catheterization in January 2012 and this demonstrated patency of all of his bypasses. This included his saphenous vein graft to the OM which had been previously stented.  The patient is doing well without anginal symptoms. He remains moderately active. He denies chest pain or dyspnea. He's had no edema, orthopnea, or PND. He does note right calf pain with moderate distant walking. His calf pain resolved when he slows down or rests. This has not changed over time.  Outpatient Encounter Prescriptions as of 05/07/2012  Medication Sig Dispense Refill  . aspirin 81 MG tablet Take 81 mg by mouth daily.      . benazepril (LOTENSIN) 40 MG tablet 1 tab daily      . clopidogrel (PLAVIX) 75 MG tablet TAKE 1 TABLET (75 MG TOTAL) BY MOUTH DAILY.  30 tablet  6  . CRESTOR 20 MG tablet TAKE ONE TABLET BY MOUTH EVERY DAY  30 each  2  . metoprolol (LOPRESSOR) 50 MG tablet TAKE 1 & 1/2 TABLETS BY MOUTH TWICE A DAY  120 tablet  1  . hydrochlorothiazide (HYDRODIURIL) 25 MG tablet Take 1 tablet (25 mg total) by mouth daily.  30 tablet  11    No Known Allergies  Past Medical History  Diagnosis Date  . Coronary atherosclerosis of artery bypass graft   . Atherosclerosis of native arteries of the extremities with intermittent claudication   . Essential hypertension, benign   . Pure hypercholesterolemia     ROS: Negative except as per HPI  BP 153/92  Pulse 79  Ht 5' 9"  (1.753 m)  Wt 98.884 kg (218 lb)  BMI 32.19 kg/m2  PHYSICAL EXAM: Pt is alert and oriented, NAD HEENT: normal Neck: JVP - normal, carotids 2+= without bruits Lungs: CTA  bilaterally CV: RRR without murmur or gallop Abd: soft, NT, Positive BS, no hepatomegaly Ext: no C/C/E Skin: warm/dry no rash  EKG:  Normal sinus rhythm 73 beats per minute, within normal limits.  ASSESSMENT AND PLAN:

## 2012-05-07 NOTE — Patient Instructions (Addendum)
BMET in 2 weeks.  Your physician wants you to follow-up in: 12 months You will receive a reminder letter in the mail two months in advance. If you don't receive a letter, please call our office to schedule the follow-up appointment.

## 2012-05-12 ENCOUNTER — Other Ambulatory Visit: Payer: Self-pay | Admitting: Cardiovascular Disease

## 2012-05-13 ENCOUNTER — Encounter: Payer: Self-pay | Admitting: Cardiovascular Disease

## 2012-05-13 NOTE — Assessment & Plan Note (Signed)
Known severe right popliteal artery stenosis. Reserve angiography for progressive symptoms or severe lifestyle limitation because of unfavorable anatomy in the popliteal space.

## 2012-05-13 NOTE — Assessment & Plan Note (Signed)
Stable without anginal symptoms. We'll continue his current medical program which includes dual antiplatelet therapy with aspirin and Plavix, and ACE inhibitor, beta blocker, and a statin drug. I would like to see him back next year for followup

## 2012-05-13 NOTE — Assessment & Plan Note (Addendum)
History of white coat hypertension noted. However, he reports some elevation in blood pressure on occasion readings at home as well. Recommend add hydrochlorothiazide 25 mg daily and followup metabolic panel in 2 weeks. He should continue on benazepril and metoprolol.

## 2012-05-14 ENCOUNTER — Other Ambulatory Visit: Payer: Self-pay | Admitting: *Deleted

## 2012-05-14 MED ORDER — METOPROLOL TARTRATE 50 MG PO TABS: 50.0000 mg | ORAL_TABLET | Freq: Two times a day (BID) | ORAL | Status: DC

## 2012-05-15 ENCOUNTER — Other Ambulatory Visit: Payer: Self-pay | Admitting: *Deleted

## 2012-05-15 MED ORDER — METOPROLOL TARTRATE 50 MG PO TABS: 75.0000 mg | ORAL_TABLET | Freq: Two times a day (BID) | ORAL | Status: DC

## 2012-05-23 ENCOUNTER — Other Ambulatory Visit (INDEPENDENT_AMBULATORY_CARE_PROVIDER_SITE_OTHER): Payer: Medicare Other

## 2012-05-23 DIAGNOSIS — I2510 Atherosclerotic heart disease of native coronary artery without angina pectoris: Secondary | ICD-10-CM

## 2012-05-23 LAB — BASIC METABOLIC PANEL
BUN: 15 mg/dL (ref 6–23)
Calcium: 10.4 mg/dL (ref 8.4–10.5)
Creatinine, Ser: 0.9 mg/dL (ref 0.4–1.5)
GFR: 85.73 mL/min (ref >60.00)
Glucose, Bld: 112 mg/dL — ABNORMAL HIGH (ref 70–99)

## 2012-07-13 ENCOUNTER — Other Ambulatory Visit: Payer: Self-pay | Admitting: Cardiovascular Disease

## 2012-07-22 ENCOUNTER — Emergency Department (HOSPITAL_COMMUNITY): Payer: Medicare Other

## 2012-07-22 ENCOUNTER — Encounter (HOSPITAL_COMMUNITY): Payer: Self-pay | Admitting: *Deleted

## 2012-07-22 ENCOUNTER — Emergency Department (HOSPITAL_COMMUNITY)
Admission: EM | Admit: 2012-07-22 | Discharge: 2012-07-22 | Disposition: A | Discharge status: Home / Self Care | Payer: Medicare Other | Attending: Emergency Medicine | Admitting: Emergency Medicine

## 2012-07-22 DIAGNOSIS — I10 Essential (primary) hypertension: Secondary | ICD-10-CM | POA: Diagnosis not present

## 2012-07-22 DIAGNOSIS — I70219 Atherosclerosis of native arteries of extremities with intermittent claudication, unspecified extremity: Secondary | ICD-10-CM | POA: Diagnosis not present

## 2012-07-22 DIAGNOSIS — R079 Chest pain, unspecified: Secondary | ICD-10-CM | POA: Diagnosis not present

## 2012-07-22 DIAGNOSIS — R0789 Other chest pain: Secondary | ICD-10-CM | POA: Diagnosis not present

## 2012-07-22 LAB — POCT I-STAT, CHEM 8
BUN: 11 mg/dL (ref 6–23)
Calcium, Ion: 1.25 mmol/L (ref 1.13–1.30)
Chloride: 108 mEq/L (ref 96–112)
Creatinine, Ser: 1 mg/dL (ref 0.50–1.35)
Glucose, Bld: 157 mg/dL — ABNORMAL HIGH (ref 70–99)
HCT: 41 % (ref 39.0–52.0)
Hemoglobin: 13.9 g/dL (ref 13.0–17.0)
Potassium: 4.2 mEq/L (ref 3.5–5.1)
Sodium: 140 mEq/L (ref 135–145)
TCO2: 20 mmol/L (ref 0–100)

## 2012-07-22 LAB — POCT I-STAT TROPONIN I: Troponin i, poc: 0 ng/mL (ref 0.00–0.08)

## 2012-07-22 LAB — CK TOTAL AND CKMB (NOT AT ARMC)
CK, MB: 3.3 ng/mL (ref 0.3–4.0)
Total CK: 141 U/L (ref 7–232)

## 2012-07-22 LAB — CBC
MCHC: 33.4 g/dL (ref 30.0–36.0)
Platelets: 202 10*3/uL (ref 150–400)
RDW: 13.3 % (ref 11.5–15.5)
WBC: 10 10*3/uL (ref 4.0–10.5)

## 2012-07-22 MED ORDER — ASPIRIN 81 MG PO CHEW: 324.0000 mg | CHEWABLE_TABLET | Freq: Once | ORAL | Status: DC

## 2012-07-22 NOTE — ED Notes (Signed)
Cardiology PA at the bedside. Pt and family updated on plan of care. To have cardiac markers drawn, if labs results abnormal pt to be admitted, if not will be discharged home. Pt has no questions at the time. Vital signs stable. Remains on cardiac monitor. Denies chest pain at the time.

## 2012-07-22 NOTE — ED Notes (Signed)
Pt resting quietly at the time. Vital signs stable. No signs of distress noted. Family at bedside. Denies chest pain at present.

## 2012-07-22 NOTE — ED Provider Notes (Signed)
History     CSN: [id number]  Arrival date & time 07/22/12  1517   First MD Initiated Contact with Patient 07/22/12 [phone]      Chief Complaint  Patient presents with  . Chest Pain    (Consider location/radiation/quality/duration/timing/severity/associated sxs/prior treatment) HPI Hx per PT. While sleeping developed L sided CP radiating to L shoulder. Mod in severity, pressure in quality, no associated N/V, SOB or diaphoresis. Called 911 and received NTG in route that alleviated pain, no known aggrevating or alleviating factors otherwise. No F/C, no cough, no leg pain or swelling.  Past Medical History  Diagnosis Date  . Coronary atherosclerosis of artery bypass graft   . Atherosclerosis of native arteries of the extremities with intermittent claudication   . Essential hypertension, benign   . Pure hypercholesterolemia     Past Surgical History  Procedure Date  . Coronary artery bypass graft   . Hernia repair   . Cholecystectomy   . Tonsillectomy   . Adenoidectomy   . Coronary angioplasty with stent placement     No family history on file.  History  Substance Use Topics  . Smoking status: Never Smoker   . Smokeless tobacco: Not on file  . Alcohol Use: No      Review of Systems  Constitutional: Negative for fever and chills.  HENT: Negative for neck pain and neck stiffness.   Eyes: Negative for pain.  Respiratory: Negative for shortness of breath.   Cardiovascular: Positive for chest pain.  Gastrointestinal: Negative for abdominal pain.  Genitourinary: Negative for dysuria.  Musculoskeletal: Negative for back pain.  Skin: Negative for rash.  Neurological: Negative for headaches.  All other systems reviewed and are negative.    Allergies  Review of patient's allergies indicates no known allergies.  Home Medications   Current Outpatient Rx  Name Route Sig Dispense Refill  . ASPIRIN 81 MG PO TABS Oral Take 81 mg by mouth daily.    Marland Kitchen BENAZEPRIL HCL 40 MG  PO TABS  1 tab daily    . CLOPIDOGREL BISULFATE 75 MG PO TABS  TAKE 1 TABLET (75 MG TOTAL) BY MOUTH DAILY. 30 tablet 6  . CRESTOR 20 MG PO TABS  TAKE ONE TABLET BY MOUTH EVERY DAY 30 tablet 9  . HYDROCHLOROTHIAZIDE 25 MG PO TABS Oral Take 1 tablet (25 mg total) by mouth daily. 30 tablet 11  . METOPROLOL TARTRATE 50 MG PO TABS Oral Take 1.5 tablets (75 mg total) by mouth 2 (two) times daily. 270 tablet 2    Needs to make a follow up appointment    BP 114/57  Pulse 55  Temp 97.8 F (36.6 C) (Oral)  Resp 16  SpO2 100%  Physical Exam  Constitutional: He is oriented to person, place, and time. He appears well-developed and well-nourished.  HENT:  Head: Normocephalic and atraumatic.  Eyes: Conjunctivae normal and EOM are normal. Pupils are equal, round, and reactive to light.  Neck: Trachea normal. Neck supple. No thyromegaly present.  Cardiovascular: Normal rate, regular rhythm, S1 normal, S2 normal and normal pulses.     No systolic murmur is present   No diastolic murmur is present  Pulses:      Radial pulses are 2+ on the right side, and 2+ on the left side.  Pulmonary/Chest: Effort normal and breath sounds normal. He has no wheezes. He has no rhonchi. He has no rales. He exhibits no tenderness.  Abdominal: Soft. Normal appearance and bowel sounds are normal. There is  no tenderness. There is no CVA tenderness and negative Murphy's sign.  Musculoskeletal:       BLE:s Calves nontender, no cords or erythema, negative Homans sign  Neurological: He is alert and oriented to person, place, and time. He has normal strength. No cranial nerve deficit or sensory deficit. GCS eye subscore is 4. GCS verbal subscore is 5. GCS motor subscore is 6.  Skin: Skin is warm and dry. No rash noted. He is not diaphoretic.  Psychiatric: His speech is normal.       Cooperative and appropriate    ED Course  Procedures (including critical care time)  Results for orders placed during the hospital encounter  of 07/22/12  CBC      Component Value Range   WBC 10.0  4.0 - 10.5 K/uL   RBC 4.72  4.22 - 5.81 MIL/uL   Hemoglobin 13.6  13.0 - 17.0 g/dL   HCT 40.7  39.0 - 52.0 %   MCV 86.2  78.0 - 100.0 fL   MCH 28.8  26.0 - 34.0 pg   MCHC 33.4  30.0 - 36.0 g/dL   RDW 13.3  11.5 - 15.5 %   Platelets 202  150 - 400 K/uL  POCT I-STAT, CHEM 8      Component Value Range   Sodium 140  135 - 145 mEq/L   Potassium 4.2  3.5 - 5.1 mEq/L   Chloride 108  96 - 112 mEq/L   BUN 11  6 - 23 mg/dL   Creatinine, Ser 1.00  0.50 - 1.35 mg/dL   Glucose, Bld 157 (*) 70 - 99 mg/dL   Calcium, Ion 1.25  1.13 - 1.30 mmol/L   TCO2 20  0 - 100 mmol/L   Hemoglobin 13.9  13.0 - 17.0 g/dL   HCT 41.0  39.0 - 52.0 %  POCT I-STAT TROPONIN I      Component Value Range   Troponin i, poc 0.00  0.00 - 0.08 ng/mL   Comment 3            Dg Chest Portable 1 View  07/22/2012  *RADIOLOGY REPORT*  Clinical Data: Left chest pain  PORTABLE CHEST - 1 VIEW  Comparison: 03/24/2008  Findings: Previous CABG.  Heart size upper limits normal.  Blunting of the   left lateral costophrenic angle.  Low lung volumes with crowding of bronchovascular structures in both lung bases. Atheromatous aorta.  IMPRESSION:  1.  Low volumes with possible small left pleural effusion.   Original Report Authenticated By: Trecia Rogers, M.D.      Date: 07/22/2012  Rate: 57  Rhythm: sinus bradycardia  QRS Axis: normal  Intervals: normal  ST/T Wave abnormalities: nonspecific ST changes  Conduction Disutrbances:none  Narrative Interpretation:   Old EKG Reviewed: unchanged  ASA. Serial evals no return of CP.   8:05 AM d/w Dr Wynonia Lawman CAR, will eval in ED for admit.    MDM   CP in adult male with known CAD, now pain free after NTG. ASA. ECG, CXR and labs reviewed. CAR consult for admit. VS and nursing notes reviewed.         Teressa Lower, MD 07/22/12 [phone]

## 2012-07-22 NOTE — H&P (Signed)
History and Physical  Patient ID: [name] [name] MRN: [id number], SOB: 24-Apr-1946 66 y.o. Date of Encounter: 07/22/2012, 11:26 AM  Primary Physician: Odis Luster, MD Primary Cardiologist: Dr. [name]  Chief Complaint: chest pain  HPI: 66 y.o. male w/ PMHx significant for CAD (s/p CABG and PCI of graft '09, cath 10/2010 patent grafts), PVD, HTN, and HLD who presented to [facility] on 07/22/2012 with complaints of chest pain.  In January 2012 he underwent exercise treadmill testing and was found to have significant ECG changes and ventricular  ectopy. He was referred for cardiac catheterization which showed patent grafts including SVG to OM which had been previously stented. Last seen by Dr. [name] in clinic 04/2012 at which time he was stable without anginal symptoms and continued on DAPT. HCTZ was added to his medical regimen for better BP control. It was noted he has severe right popliteal artery stenosis, but angiography is being reserved for progressive symptoms or severe lifestyle limitation because of unfavorable anatomy in the popliteal space.   He reports increased fatigue over the past couple weeks to months. Has been feeling fluttering in his chest intermittently over the last two weeks and felt an irregular heart beat. It only lasts a few seconds and is not associated with chest pain, sob, or dizziness. No syncope. Was to see Dr. [name] Tuesday for these complaints. Yesterday he was doing yard work and heavy lifting, but did not experience the fluttering or chest pain. Last night around 9pm felt left upper chest pain with radiation to his back. Describes it as a muscle cramp. No associated sob, diaphoresis, nausea, palpitations, or dizziness. It lasted a few seconds then resolved. It recurred around midnight, but was more severe so he used SL NTG spray with mild relief, but states it was an old prescription. He then call EMS and when they arrived it was already easing off, but they  gave him NTG with complete relief. Reports being very active around the house and doing a lot of walking for his job without chest pain or sob. Drives long distances for work, but stops frequently and denies calf swelling or pain (other than chronic RLE claudication that has not changed).  No chest wall tenderness nor exacerbation of discomfort by movement of the trunk or upper extremities.  EKG shows NSR 57bpm, TWI V1 otherwise unchanged from prior EKG. CXR shows low volumes with possible small left pleural effusion. Labs are significant for normal poc troponin, glucose 157, otherwise unremarkable BMET/CBC. Not tachypneic, tachycardic, or hypoxic. Chest pain free.   Past Medical History  Diagnosis Date  . Coronary atherosclerosis of artery bypass graft   . Atherosclerosis of native arteries of the extremities with intermittent claudication   . Essential hypertension, benign   . Pure hypercholesterolemia     Cath 10/2010 PROCEDURAL FINDINGS:  1. Aortic pressure is 144/82 with a mean of 100.  2. Left main coronary artery: The left mainstem is severely calcified with a 90% distal left main stenosis. The ostial LAD is totally occluded and heavily calcified. The ostial Circumflex has diffuse 90% stenosis leading into the AV groove circumflex which has minimal filling because of competitive graft flow.  1. Right coronary artery: The right coronary artery is patent throughout its course and has diffuse irregularity with 50% mid  stenosis and 50% distal stenosis. The ostium of the PDA branch also has 50% stenosis. There are no critical stenoses throughout the course of the right coronary artery but there is diffuse  disease throughout the entire vessel. The distal RCA and its branch vessels fill competitively from both native and graft flow.  2. Saphenous vein graft angiography. The saphenous vein graft to obtuse marginal branch is patent. There is a patent small stent at the distal anastomotic site. There  is significant mismatch between the size of the vein graft and the native circulation, but the graft flow is good and it is greatly improved from the previous study. There is mild-to-moderate diffuse restenosis throughout the small stent at that site. Both OM branches fill well with TIMI 3 flow.  3. Saphenous vein graft to diagonal is widely patent. There is no significant stenosis. The graft also retrograde fills the LAD.  The proximal diagonal before the graft insertion site has 95% stenosis. The proximal LAD has 90% stenosis just beyond the origin of the grafted diagonal branch.  4. Saphenous vein graft to distal right coronary artery is widely patent. There is no significant stenosis throughout the body of  that graft.  5. Left internal mammary artery to LAD is widely patent. The LAD fills competitively with much of the flow coming from the diagonal but also some antegrade flow through the left internal mammary.  ASSESSMENT:  1. Severe three-vessel coronary artery disease with severe distal left main stenosis, total occlusion of the left anterior descending, severe proximal left circumflex stenosis, and moderate diffuse right coronary artery stenosis.  2. Status post coronary bypass surgery with continued patency of the saphenous vein graft to distal right coronary artery, saphenous vein graft to diagonal, and left internal mammary artery to left anterior descending.  3. Patency of the saphenous vein graft to obtuse marginal with size mismatch between the vein graft and the distal vessel but continued patency of the stent at distal anastomotic site.  RECOMMENDATIONS: Recommend continued medical therapy.  Surgical History:  Past Surgical History  Procedure Date  . Coronary artery bypass graft   . Hernia repair   . Cholecystectomy   . Tonsillectomy   . Adenoidectomy   . Coronary angioplasty with stent placement     Home Meds: Medication Sig  aspirin 81 MG tablet Take 81 mg by mouth daily.    benazepril (LOTENSIN) 40 MG tablet Take 40 mg by mouth daily. 1 tab daily  clopidogrel (PLAVIX) 75 MG tablet Take 75 mg by mouth daily.  hydrochlorothiazide (HYDRODIURIL) 25 MG tablet Take 1 tablet (25 mg total) by mouth daily.  metoprolol (LOPRESSOR) 50 MG tablet Take 1.5 tablets (75 mg total) by mouth 2 (two) times daily.  rosuvastatin (CRESTOR) 20 MG tablet Take 20 mg by mouth daily.   Allergies: No Known Allergies  History   Social History  . Marital Status: Married    Spouse Name: N/A    Number of Children: N/A  . Years of Education: N/A   Occupational History  . Not on file.   Social History Main Topics  . Smoking status: Never Smoker   . Smokeless tobacco: Not on file  . Alcohol Use: No  . Drug Use: No  . Sexually Active: Not on file   Other Topics Concern  . Not on file   Social History Narrative  . No narrative on file    Family History: Negative for premature coronary artery disease Review of Systems: General: negative for chills, fever, night sweats or weight changes.  Cardiovascular: (+) chest pain, palpitations; negative for shortness of breath, dyspnea on exertion, edema, orthopnea, paroxysmal nocturnal dyspnea  Dermatological: negative for rash Respiratory: negative  for cough or wheezing Urologic: negative for hematuria Abdominal: negative for nausea, vomiting, diarrhea, bright red blood per rectum, melena, or hematemesis Neurologic: negative for visual changes, syncope, or dizziness All other systems reviewed and are otherwise negative except as noted above.  Labs: Component Value Date   WBC 10.0 07/22/2012   HGB 13.9 07/22/2012   HCT 41.0 07/22/2012   MCV 86.2 07/22/2012   PLT 202 07/22/2012    Lab 07/22/12 0714  NA 140  K 4.2  CL 108  CO2 --  BUN 11  CREATININE 1.00  GLUCOSE 157*     07/22/2012 07:13  Troponin i, poc 0.00   07/22/2012   PORTABLE CHEST - 1 VIEW    Findings: Previous CABG.  Heart size upper limits normal.  Blunting of the    left lateral costophrenic angle.  Low lung volumes with crowding of bronchovascular structures in both lung bases. Atheromatous aorta.  IMPRESSION:  1.  Low volumes with possible small left pleural effusion.        EKG: 07/22/12 @ 0608 - NSR 57bpm, TWI V1 otherwise unchanged from prior EKG  Physical Exam: Blood pressure 131/65, pulse 67, temperature 98.7 F (37.1 C), temperature source Oral, resp. rate 18, SpO2 99.00%. General: Well developed, white male in no acute distress. Head: Normocephalic, atraumatic, sclera non-icteric, nares are without discharge Neck: Supple. Negative for carotid bruits. No JVD. Lungs: Fine bibasilar rales L>R. No wheezes or rhonchi. Breathing is unlabored. Heart: RRR with S1 S2. 2/6 systolic murmur USB. No rubs, or gallops appreciated. Abdomen: Soft, non-tender, non-distended with normoactive bowel sounds. No rebound/guarding. No obvious abdominal masses. Msk:  Strength and tone appear normal for age. Extremities: No edema. No clubbing or cyanosis. Distal pedal pulses are 2+ and equal bilaterally. Neuro: Alert and oriented X 3. Moves all extremities spontaneously. Psych:  Responds to questions appropriately with a normal affect.   ASSESSMENT AND PLAN:  66 y.o. male w/ PMHx significant for CAD (s/p CABG and PCI of graft '09, cath 10/2010 patent grafts), PVD, HTN, and HLD who presented to [facility] on 07/22/2012 with complaints of chest pain.  1. Chest pain with history of CAD s/p CABG & PCI of graft '09 2. Palpitations 3. Increased fatigue 4. Hypertension 5. Hyperlipidemia 6. Peripheral Vascular Disease with claudication  Mr. Sitzman presents with chest pain with mostly atypical features. Was doing heavy yard work a few hours prior to onset of chest pain. Describes the pain as a muscle cramp. Currently chest pain free. Poc troponin normal and EKG without acute ischemic changes. It sounds more musculoskeletal in nature. Given his cardiac history will obtain  another set of cardiac enzymes. If they remain normal can be discharged home with outpatient lexiscan myoview in our office early next week. In regards to his "fluttering" palpitations, telemetry shows occasional PVCs, but no arrhythmias. Consider holter monitor if palpitations persist. Cont antihypertensives and antilipidemics.   HOPE, JESSICA PA-C 07/22/2012, 11:26 AM  Cardiology Attending Patient interviewed and examined. Discussed with [facility] PA-C.  Above note annotated and modified based upon my findings.  Chest pain is noncardiac. Palpitations do not suggest a significant arrhythmia. Patient is 4 years post CABG with patent grafts demonstrated 3 years postoperatively suggesting a very low risk for graft occlusion. Standard treadmill exercise test known to be abnormal. We will proceed with a nuclear stress test as an outpatient if a second set of cardiac markers proved negative.  Jacqulyn Ducking, MD 07/22/2012, 1:55 PM

## 2012-07-22 NOTE — ED Notes (Signed)
Per EMS, pt having CP tonight, non radiating. EKG WNL. IV established. 1 nitro given, and now pain free.

## 2012-07-22 NOTE — ED Provider Notes (Signed)
  Physical Exam  BP 157/73  Pulse 69  Temp 98.7 F (37.1 C) (Oral)  Resp 18  SpO2 98%  Physical Exam  ED Course  Procedures  MDM Patient has been seen by the power cardiology in ER. Her cycle enzymes. They have discharged him and will followup with a stress test.      Jasper Riling. Alvino Chapel, MD 07/22/12 1406

## 2012-07-23 ENCOUNTER — Telehealth: Payer: Self-pay | Admitting: Cardiovascular Disease

## 2012-07-23 DIAGNOSIS — R079 Chest pain, unspecified: Secondary | ICD-10-CM

## 2012-07-23 DIAGNOSIS — I2510 Atherosclerotic heart disease of native coronary artery without angina pectoris: Secondary | ICD-10-CM

## 2012-07-23 NOTE — Telephone Encounter (Signed)
The pt was seen in the ER by Dr Lattie Haw for CP.  I will forward this message to Dr [name] to review and give orders for further testing if needed.

## 2012-07-23 NOTE — Telephone Encounter (Signed)
New message:  Pt had appt with [name] [name] for tomorrow but seen in hospital and told to cancel that.  He stated they told him he needed a stress test.  Please call and advise.  Appt for tomorrow was cancelled.

## 2012-07-23 NOTE — Telephone Encounter (Signed)
I reviewed Dr. Izell [place] consultation. He recommended a nuclear stress scan for evaluation of chest pain in this gentleman with known CAD status post CABG. I'll see him back in followup after his stress test is completed.

## 2012-07-24 ENCOUNTER — Ambulatory Visit: Payer: Medicare Other | Admitting: Nurse Practitioner

## 2012-07-24 NOTE — Telephone Encounter (Signed)
Pt scheduled for Myoview and follow-up office visit with Dr [name].

## 2012-07-30 ENCOUNTER — Ambulatory Visit (HOSPITAL_COMMUNITY): Payer: Medicare Other | Attending: Internal Medicine | Admitting: Radiology

## 2012-07-30 ENCOUNTER — Encounter (HOSPITAL_COMMUNITY): Payer: Medicare Other

## 2012-07-30 VITALS — BP 167/86 | Ht 69.0 in | Wt 217.0 lb

## 2012-07-30 DIAGNOSIS — I10 Essential (primary) hypertension: Secondary | ICD-10-CM | POA: Diagnosis not present

## 2012-07-30 DIAGNOSIS — I739 Peripheral vascular disease, unspecified: Secondary | ICD-10-CM | POA: Insufficient documentation

## 2012-07-30 DIAGNOSIS — R079 Chest pain, unspecified: Secondary | ICD-10-CM | POA: Diagnosis not present

## 2012-07-30 DIAGNOSIS — R002 Palpitations: Secondary | ICD-10-CM | POA: Insufficient documentation

## 2012-07-30 DIAGNOSIS — I2510 Atherosclerotic heart disease of native coronary artery without angina pectoris: Secondary | ICD-10-CM

## 2012-07-30 MED ORDER — TECHNETIUM TC 99M SESTAMIBI GENERIC - CARDIOLITE
30.0000 | Freq: Once | INTRAVENOUS | Status: AC | PRN
  Administered 2012-07-30: 30 via INTRAVENOUS

## 2012-07-30 MED ORDER — TECHNETIUM TC 99M SESTAMIBI GENERIC - CARDIOLITE
10.0000 | Freq: Once | INTRAVENOUS | Status: AC | PRN
  Administered 2012-07-30: 10 via INTRAVENOUS

## 2012-07-30 NOTE — Progress Notes (Signed)
[facility] NUCLEAR MED [id number] [place] [address] [phone]  Cardiology Nuclear Med Study  [name] [name] is a 66 y.o. male     MRN : [id number]     DOB: 07-18-1946  Procedure Date: 07/30/2012  Nuclear Med Background Indication for Stress Test:  Evaluation for Ischemia, Graft Patency and Post Hospital:[place] with chest pain and (-) enzymes/EKG History:  2009 CABG x5, Stents SVG-OM 2012 Heart Cath: patent grafts/stent  07/22/12 [facility] CP with (-) enzymes EKG Cardiac Risk Factors: Claudication, Hypertension, Lipids and PVD  Symptoms:  Chest Pain, Fatigue and Palpitations   Nuclear Pre-Procedure Caffeine/Decaff Intake:  None NPO After: 7:00pm   Lungs:  clear O2 Sat: 98% on room air. IV 0.9% NS with Angio Cath:  20g  IV Site: R Hand  IV Started by:  Matilde Haymaker, RN  Chest Size (in):  42 Cup Size: n/a  Height: 5' 9"  (1.753 m)  Weight:  217 lb (98.431 kg)  BMI:  Body mass index is 32.05 kg/(m^2). Tech Comments:  Lopressor held x 16 hrs.    Nuclear Med Study 1 or 2 day study: 1 day  Stress Test Type:  Stress  Reading MD: Dorris Carnes, MD  Order Authorizing Provider:  Legrand Como Cooper,MD  Resting Radionuclide: Technetium 58mSestamibi  Resting Radionuclide Dose: 11.0 mCi   Stress Radionuclide:  Technetium 958mestamibi  Stress Radionuclide Dose: 33.0 mCi           Stress Protocol Rest HR: 80 Stress HR: 150  Rest BP: 167/86 Stress BP: 216/77  Exercise Time (min): 5:31 METS: 7.0   Predicted Max HR: 152 bpm % Max HR: 98.68 bpm Rate Pressure Product: 32400   Dose of Adenosine (mg):  n/a Dose of Lexiscan: n/a mg  Dose of Atropine (mg): n/a Dose of Dobutamine: n/a mcg/kg/min (at max HR)  Stress Test Technologist: SaPerrin MalteseEMT-P  Nuclear Technologist:  StCharlton AmorCNMT     Rest Procedure:  Myocardial perfusion imaging was performed at rest 45 minutes following the intravenous administration of Technetium 9957metrofosmin. Rest ECG: SR with PVCS  Stress Procedure:  The patient performed treadmill exercise using a Bruce  Protocol for 5:31 minutes. The patient stopped due to fatigue and denied any chest pain.  There were no significant ST-T wave changes and freq pvcs.  Technetium 55m37mtamibi was injected at peak exercise and myocardial perfusion imaging was performed after a brief delay. Stress ECG: No significant change from baseline ECG  QPS Raw Data Images:  Images were motion corrected.  Soft tissue (diaphragm) underlies inferior wall. Stress Images:  Normal homogeneous uptake in all areas of the myocardium. Rest Images:  Normal homogeneous uptake in all areas of the myocardium. Subtraction (SDS):  No evidence of ischemia. Transient Ischemic Dilatation (Normal <1.22):  0.82 Lung/Heart Ratio (Normal <0.45):  0.33  Quantitative Gated Spect Images QGS EDV:  54 ml QGS ESV:  19 ml  Impression Exercise Capacity:  Fair exercise capacity. BP Response:  Hypertensive blood pressure response. Clinical Symptoms:  No chest pain. ECG Impression:  Frequent PVCs throughout exercise, occasional couplet.  No significant ST changes to suggest ischemia. Comparison with Prior Nuclear Study: No previous scan  Overall Impression:  There is no sign of scar or ischemia.  LV Ejection Fraction: 66%.  LV Wall Motion:  Normal.  PaulDorris Carnes

## 2012-08-07 ENCOUNTER — Other Ambulatory Visit: Payer: Self-pay | Admitting: *Deleted

## 2012-08-13 ENCOUNTER — Other Ambulatory Visit: Payer: Self-pay | Admitting: Cardiovascular Disease

## 2012-08-14 ENCOUNTER — Encounter (HOSPITAL_COMMUNITY): Payer: Medicare Other

## 2012-08-28 ENCOUNTER — Ambulatory Visit: Payer: Medicare Other | Admitting: Cardiovascular Disease

## 2012-08-31 ENCOUNTER — Telehealth: Payer: Self-pay | Admitting: Cardiovascular Disease

## 2012-08-31 NOTE — Telephone Encounter (Signed)
New problem:  FYI    Patient calling to say that an additional crestor was called in . He will not be getting that medication due to recently  Refill purchase.

## 2012-08-31 NOTE — Telephone Encounter (Signed)
**Note De-Identified Mahdi Frye Obfuscation** Pt's wife just wanted [place] to know that pt. already has 90 day refill on Crestor.

## 2012-12-11 DIAGNOSIS — R040 Epistaxis: Secondary | ICD-10-CM | POA: Diagnosis not present

## 2012-12-11 DIAGNOSIS — H906 Mixed conductive and sensorineural hearing loss, bilateral: Secondary | ICD-10-CM | POA: Diagnosis not present

## 2012-12-11 DIAGNOSIS — J3489 Other specified disorders of nose and nasal sinuses: Secondary | ICD-10-CM | POA: Diagnosis not present

## 2013-03-05 DIAGNOSIS — E119 Type 2 diabetes mellitus without complications: Secondary | ICD-10-CM | POA: Diagnosis not present

## 2013-03-05 DIAGNOSIS — Z1331 Encounter for screening for depression: Secondary | ICD-10-CM | POA: Diagnosis not present

## 2013-03-05 DIAGNOSIS — I10 Essential (primary) hypertension: Secondary | ICD-10-CM | POA: Diagnosis not present

## 2013-03-05 DIAGNOSIS — E7800 Pure hypercholesterolemia, unspecified: Secondary | ICD-10-CM | POA: Diagnosis not present

## 2013-04-02 ENCOUNTER — Other Ambulatory Visit: Payer: Self-pay | Admitting: Cardiovascular Disease

## 2013-04-08 ENCOUNTER — Other Ambulatory Visit: Payer: Self-pay | Admitting: Cardiovascular Disease

## 2013-04-08 NOTE — Telephone Encounter (Signed)
CAD, ARTERY BYPASS GRAFT - Sherren Mocha, MD at 05/13/2012 12:39 PM  Status: Written Related Problem: CAD, ARTERY BYPASS GRAFT  Stable without anginal symptoms. We'll continue his current medical program which includes dual antiplatelet therapy with aspirin and Plavix, and ACE inhibitor, beta blocker, and a statin drug. I would like to see him back next year for followup    metoprolol (LOPRESSOR) 50 MG tablet  TAKE 1 & 1/2 TABLETS BY MOUTH TWICE A DAY   120 tablet   1

## 2013-05-29 ENCOUNTER — Encounter: Payer: Self-pay | Admitting: Cardiovascular Disease

## 2013-05-29 ENCOUNTER — Ambulatory Visit (INDEPENDENT_AMBULATORY_CARE_PROVIDER_SITE_OTHER): Payer: Medicare Other | Admitting: Cardiovascular Disease

## 2013-05-29 VITALS — BP 132/80 | HR 78 | Ht 69.0 in | Wt 215.0 lb

## 2013-05-29 DIAGNOSIS — I2510 Atherosclerotic heart disease of native coronary artery without angina pectoris: Secondary | ICD-10-CM | POA: Diagnosis not present

## 2013-05-29 NOTE — Patient Instructions (Addendum)
Your physician wants you to follow-up in: 1 YEAR with Dr Cooper.  You will receive a reminder letter in the mail two months in advance. If you don't receive a letter, please call our office to schedule the follow-up appointment.  Your physician recommends that you continue on your current medications as directed. Please refer to the Current Medication list given to you today.  

## 2013-05-29 NOTE — Progress Notes (Signed)
   HPI:   67 year old gentleman presenting for followup evaluation. The patient has coronary artery disease status post CABG in 2009. He underwent stenting at the distal anastomotic site of one of the saphenous vein graft early after surgery when he presented with a non-ST elevation MI. His last heart catheterization was in 2012 and this demonstrated patency of all of his bypass grafts. The patient developed chest pain last October and a nuclear stress test was done. This demonstrated normal perfusion and normal left ventricular function.  The patient is doing well at present. He denies chest pain or pressure, dyspnea, palpitations, orthopnea, or PND. He has mild leg discomfort with ambulation, but no longer has calf pain. He has a known right popliteal artery stenosis. He's had no rest pain or ischemic ulceration.  Outpatient Encounter Prescriptions as of 05/29/2013  Medication Sig Dispense Refill  . aspirin 81 MG tablet Take 81 mg by mouth daily.      . benazepril (LOTENSIN) 40 MG tablet Take 40 mg by mouth daily. 1 tab daily      . clopidogrel (PLAVIX) 75 MG tablet Take 75 mg by mouth daily.      . hydrochlorothiazide (HYDRODIURIL) 25 MG tablet Take 1 tablet (25 mg total) by mouth daily.  30 tablet  11  . metFORMIN (GLUCOPHAGE) 500 MG tablet TAKE ONE TABLET IN THE MORNING , AND ONE IN THE EVENING      . metoprolol (LOPRESSOR) 50 MG tablet TAKE 1.5 TABLETS (75 MG TOTAL) BY MOUTH 2 (TWO) TIMES DAILY.  45 tablet  2  . rosuvastatin (CRESTOR) 20 MG tablet Take 20 mg by mouth daily.      . [DISCONTINUED] clopidogrel (PLAVIX) 75 MG tablet TAKE 1 TABLET (75 MG TOTAL) BY MOUTH DAILY.  30 tablet  11   No facility-administered encounter medications on file as of 05/29/2013.    No Known Allergies  Past Medical History  Diagnosis Date  . Coronary atherosclerosis of artery bypass graft   . Atherosclerosis of native arteries of the extremities with intermittent claudication   . Essential hypertension,  benign   . Pure hypercholesterolemia    ROS: Negative except as per HPI  BP 132/80  Pulse 78  Ht 5' 9"  (1.753 m)  Wt 97.523 kg (215 lb)  BMI 31.74 kg/m2  SpO2 95%  PHYSICAL EXAM: Pt is alert and oriented, NAD HEENT: normal Neck: JVP - normal, carotids 2+= without bruits Lungs: CTA bilaterally CV: RRR without murmur or gallop Abd: soft, NT, Positive BS, no hepatomegaly Ext: no C/C/E Skin: warm/dry no rash  EKG:  Normal sinus rhythm 70 beats per minute, within normal limits.  ASSESSMENT AND PLAN: 1. Coronary artery disease. The patient has native vessel and graft disease. He has undergone coronary stenting after early graft failure. I think he should remain on dual antiplatelet therapy with aspirin and Plavix. His risk factors appear well controlled.  2. Hypertension. Blood pressure is better controlled after the addition of hydrochlorothiazide last year. He continues on metoprolol and benazepril as well.  3. Hyperlipidemia. Lipids are followed by Dr. Drema Dallas. Patient takes Crestor 20 mg daily.  4. 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 The patient has no significant lifestyle limitation at the present time. Will continue with medical therapy.  For followup I will see him back in one year.  Sherren Mocha 05/29/2013 1:09 PM

## 2013-05-30 ENCOUNTER — Other Ambulatory Visit: Payer: Self-pay | Admitting: Cardiovascular Disease

## 2013-06-04 ENCOUNTER — Other Ambulatory Visit: Payer: Self-pay | Admitting: Cardiovascular Disease

## 2013-06-10 ENCOUNTER — Other Ambulatory Visit: Payer: Self-pay | Admitting: Cardiovascular Disease

## 2013-09-18 ENCOUNTER — Other Ambulatory Visit: Payer: Self-pay | Admitting: Cardiology

## 2013-12-25 ENCOUNTER — Other Ambulatory Visit: Payer: Self-pay | Admitting: Cardiovascular Disease

## 2013-12-27 ENCOUNTER — Telehealth: Payer: Self-pay

## 2013-12-27 ENCOUNTER — Other Ambulatory Visit: Payer: Self-pay

## 2013-12-27 MED ORDER — CLOPIDOGREL BISULFATE 75 MG PO TABS: 75.0000 mg | ORAL_TABLET | Freq: Every day | ORAL | Status: DC

## 2013-12-27 NOTE — Telephone Encounter (Signed)
error 

## 2014-01-10 DIAGNOSIS — J3489 Other specified disorders of nose and nasal sinuses: Secondary | ICD-10-CM | POA: Diagnosis not present

## 2014-01-10 DIAGNOSIS — H906 Mixed conductive and sensorineural hearing loss, bilateral: Secondary | ICD-10-CM | POA: Diagnosis not present

## 2014-01-14 DIAGNOSIS — I491 Atrial premature depolarization: Secondary | ICD-10-CM | POA: Diagnosis not present

## 2014-01-14 DIAGNOSIS — I10 Essential (primary) hypertension: Secondary | ICD-10-CM | POA: Diagnosis not present

## 2014-01-14 DIAGNOSIS — E119 Type 2 diabetes mellitus without complications: Secondary | ICD-10-CM | POA: Diagnosis not present

## 2014-01-14 DIAGNOSIS — I499 Cardiac arrhythmia, unspecified: Secondary | ICD-10-CM | POA: Diagnosis not present

## 2014-01-14 DIAGNOSIS — E7800 Pure hypercholesterolemia, unspecified: Secondary | ICD-10-CM | POA: Diagnosis not present

## 2014-01-21 DIAGNOSIS — IMO0001 Reserved for inherently not codable concepts without codable children: Secondary | ICD-10-CM | POA: Diagnosis not present

## 2014-01-21 DIAGNOSIS — R9431 Abnormal electrocardiogram [ECG] [EKG]: Secondary | ICD-10-CM | POA: Diagnosis not present

## 2014-01-21 DIAGNOSIS — R7989 Other specified abnormal findings of blood chemistry: Secondary | ICD-10-CM | POA: Diagnosis not present

## 2014-03-14 DIAGNOSIS — K625 Hemorrhage of anus and rectum: Secondary | ICD-10-CM | POA: Diagnosis not present

## 2014-03-14 DIAGNOSIS — IMO0001 Reserved for inherently not codable concepts without codable children: Secondary | ICD-10-CM | POA: Diagnosis not present

## 2014-03-14 DIAGNOSIS — Z125 Encounter for screening for malignant neoplasm of prostate: Secondary | ICD-10-CM | POA: Diagnosis not present

## 2014-03-14 DIAGNOSIS — E7800 Pure hypercholesterolemia, unspecified: Secondary | ICD-10-CM | POA: Diagnosis not present

## 2014-03-14 DIAGNOSIS — Z1331 Encounter for screening for depression: Secondary | ICD-10-CM | POA: Diagnosis not present

## 2014-03-14 DIAGNOSIS — Z23 Encounter for immunization: Secondary | ICD-10-CM | POA: Diagnosis not present

## 2014-03-14 DIAGNOSIS — I10 Essential (primary) hypertension: Secondary | ICD-10-CM | POA: Diagnosis not present

## 2014-03-14 DIAGNOSIS — Z0000 Encounter for general adult medical examination without abnormal findings: Secondary | ICD-10-CM | POA: Diagnosis not present

## 2014-04-18 ENCOUNTER — Other Ambulatory Visit: Payer: Self-pay | Admitting: Cardiovascular Disease

## 2014-04-25 DIAGNOSIS — K921 Melena: Secondary | ICD-10-CM | POA: Diagnosis not present

## 2014-04-25 DIAGNOSIS — Z86010 Personal history of colonic polyps: Secondary | ICD-10-CM | POA: Diagnosis not present

## 2014-05-02 ENCOUNTER — Telehealth: Payer: Self-pay | Admitting: Cardiovascular Disease

## 2014-05-02 NOTE — Telephone Encounter (Signed)
Received request from Nurse fax box: [facility]: called to verify fax: phones and faxes are working now To: [facility] Phone number: [phone] Fax number: [phone] Attention:

## 2014-05-22 ENCOUNTER — Encounter: Payer: Self-pay | Admitting: Cardiovascular Disease

## 2014-05-22 ENCOUNTER — Ambulatory Visit (INDEPENDENT_AMBULATORY_CARE_PROVIDER_SITE_OTHER): Payer: Medicare Other | Admitting: Cardiovascular Disease

## 2014-05-22 VITALS — BP 134/80 | HR 72 | Ht 69.0 in | Wt 212.0 lb

## 2014-05-22 DIAGNOSIS — I25810 Atherosclerosis of coronary artery bypass graft(s) without angina pectoris: Secondary | ICD-10-CM | POA: Diagnosis not present

## 2014-05-22 DIAGNOSIS — E785 Hyperlipidemia, unspecified: Secondary | ICD-10-CM

## 2014-05-22 DIAGNOSIS — I10 Essential (primary) hypertension: Secondary | ICD-10-CM

## 2014-05-22 NOTE — Progress Notes (Signed)
    HPI:  68 year old gentleman presenting for followup evaluation. The patient has coronary artery disease status post CABG in 2009. He underwent stenting at the distal anastomotic site of one of the saphenous vein graft early after surgery when he presented with a non-ST elevation MI. His last heart catheterization was in 2012 and this demonstrated patency of all of his bypass grafts.  The patient has had no recent cardiac problems. He did admit to a recent episode of fleeting chest pain at rest. This lasted only a few seconds. He has had no symptoms with physical exertion or with walking. He denies shortness of breath, edema, or heart palpitations. He's had no recurrence of claudication symptoms. He continues to work long hours and is doing a lot of driving. He's not been able to participate in any routine structured exercise program.   Outpatient Encounter Prescriptions as of 05/22/2014  Medication Sig  . aspirin 81 MG tablet Take 81 mg by mouth daily.  . benazepril (LOTENSIN) 40 MG tablet Take 40 mg by mouth daily. 1 tab daily  . CRESTOR 20 MG tablet TAKE ONE TABLET BY MOUTH ONCE DAILY  . hydrochlorothiazide (HYDRODIURIL) 25 MG tablet TAKE ONE TABLET BY MOUTH EVERY DAY  . metFORMIN (GLUCOPHAGE) 500 MG tablet TAKE TWO TABLET IN THE MORNING , AND ONE IN THE EVENING  . metoprolol (LOPRESSOR) 50 MG tablet TAKE 1 AND 1/2 TABLETS TWICE A DAY  . [DISCONTINUED] clopidogrel (PLAVIX) 75 MG tablet Take 1 tablet (75 mg total) by mouth daily.    No Known Allergies  Past Medical History  Diagnosis Date  . Coronary atherosclerosis of artery bypass graft   . Atherosclerosis of native arteries of the extremities with intermittent claudication   . Essential hypertension, benign   . Pure hypercholesterolemia     ROS: Negative except as per HPI  BP 134/80  Pulse 72  Ht 5' 9"  (1.753 m)  Wt 212 lb (96.163 kg)  BMI 31.29 kg/m2  PHYSICAL EXAM: Pt is alert and oriented, NAD HEENT: normal Neck: JVP -  normal, carotids 2+= without bruits Lungs: CTA bilaterally CV: RRR without murmur or gallop Abd: soft, NT, Positive BS, no hepatomegaly Ext: no C/C/E Skin: warm/dry no rash  EKG:  Normal sinus rhythm 72 beats per minute, nonspecific ST abnormality.  ASSESSMENT AND PLAN: 1. Coronary artery disease, native vessel. The patient is stable without symptoms of angina. He continues on aspirin. He has stopped Plavix. Will continue his same medical program which was reviewed in detail today.  2. Hypertension. Blood pressure is controlled on a combination of metoprolol, benazepril, and hydrochlorothiazide.  3. Hyperlipidemia. Lipids are followed by his primary care physician. He remains on Crestor.  4. Lower extremity peripheral arterial disease. No limitation at present and he denies any claudication symptoms.  Sherren Mocha MD 05/22/2014 2:39 PM

## 2014-05-22 NOTE — Patient Instructions (Signed)
Your physician wants you to follow-up in: 1 YEAR with Dr Cooper.  You will receive a reminder letter in the mail two months in advance. If you don't receive a letter, please call our office to schedule the follow-up appointment.  Your physician recommends that you continue on your current medications as directed. Please refer to the Current Medication list given to you today.  

## 2014-05-23 ENCOUNTER — Ambulatory Visit: Payer: Medicare Other | Admitting: Cardiovascular Disease

## 2014-05-27 DIAGNOSIS — K5730 Diverticulosis of large intestine without perforation or abscess without bleeding: Secondary | ICD-10-CM | POA: Diagnosis not present

## 2014-05-27 DIAGNOSIS — D126 Benign neoplasm of colon, unspecified: Secondary | ICD-10-CM | POA: Diagnosis not present

## 2014-05-27 DIAGNOSIS — Z09 Encounter for follow-up examination after completed treatment for conditions other than malignant neoplasm: Secondary | ICD-10-CM | POA: Diagnosis not present

## 2014-05-27 DIAGNOSIS — D128 Benign neoplasm of rectum: Secondary | ICD-10-CM | POA: Diagnosis not present

## 2014-05-27 DIAGNOSIS — Z86010 Personal history of colonic polyps: Secondary | ICD-10-CM | POA: Diagnosis not present

## 2014-06-07 ENCOUNTER — Other Ambulatory Visit: Payer: Self-pay | Admitting: Cardiovascular Disease

## 2014-06-16 DIAGNOSIS — IMO0001 Reserved for inherently not codable concepts without codable children: Secondary | ICD-10-CM | POA: Diagnosis not present

## 2014-06-16 DIAGNOSIS — E7800 Pure hypercholesterolemia, unspecified: Secondary | ICD-10-CM | POA: Diagnosis not present

## 2014-06-16 DIAGNOSIS — R7989 Other specified abnormal findings of blood chemistry: Secondary | ICD-10-CM | POA: Diagnosis not present

## 2014-06-16 DIAGNOSIS — Z125 Encounter for screening for malignant neoplasm of prostate: Secondary | ICD-10-CM | POA: Diagnosis not present

## 2014-06-16 DIAGNOSIS — I10 Essential (primary) hypertension: Secondary | ICD-10-CM | POA: Diagnosis not present

## 2014-06-20 ENCOUNTER — Encounter: Payer: Self-pay | Admitting: Cardiovascular Disease

## 2014-07-17 DIAGNOSIS — N400 Enlarged prostate without lower urinary tract symptoms: Secondary | ICD-10-CM | POA: Diagnosis not present

## 2014-08-06 ENCOUNTER — Other Ambulatory Visit: Payer: Self-pay | Admitting: Cardiovascular Disease

## 2014-09-17 DIAGNOSIS — L820 Inflamed seborrheic keratosis: Secondary | ICD-10-CM | POA: Diagnosis not present

## 2014-09-17 DIAGNOSIS — D225 Melanocytic nevi of trunk: Secondary | ICD-10-CM | POA: Diagnosis not present

## 2014-10-06 DIAGNOSIS — J069 Acute upper respiratory infection, unspecified: Secondary | ICD-10-CM | POA: Diagnosis not present

## 2014-10-14 ENCOUNTER — Other Ambulatory Visit: Payer: Self-pay | Admitting: Cardiovascular Disease

## 2014-10-14 MED ORDER — METOPROLOL TARTRATE 50 MG PO TABS: ORAL_TABLET | ORAL | Status: DC

## 2014-10-20 DIAGNOSIS — N400 Enlarged prostate without lower urinary tract symptoms: Secondary | ICD-10-CM | POA: Diagnosis not present

## 2014-10-27 DIAGNOSIS — R972 Elevated prostate specific antigen [PSA]: Secondary | ICD-10-CM | POA: Diagnosis not present

## 2014-10-27 DIAGNOSIS — N400 Enlarged prostate without lower urinary tract symptoms: Secondary | ICD-10-CM | POA: Diagnosis not present

## 2014-12-03 DIAGNOSIS — R972 Elevated prostate specific antigen [PSA]: Secondary | ICD-10-CM | POA: Diagnosis not present

## 2014-12-09 ENCOUNTER — Other Ambulatory Visit: Payer: Self-pay | Admitting: Cardiovascular Disease

## 2015-01-16 DIAGNOSIS — I10 Essential (primary) hypertension: Secondary | ICD-10-CM | POA: Diagnosis not present

## 2015-01-16 DIAGNOSIS — Z7982 Long term (current) use of aspirin: Secondary | ICD-10-CM | POA: Diagnosis not present

## 2015-01-16 DIAGNOSIS — H906 Mixed conductive and sensorineural hearing loss, bilateral: Secondary | ICD-10-CM | POA: Diagnosis not present

## 2015-01-16 DIAGNOSIS — J3489 Other specified disorders of nose and nasal sinuses: Secondary | ICD-10-CM | POA: Diagnosis not present

## 2015-03-18 DIAGNOSIS — Z0000 Encounter for general adult medical examination without abnormal findings: Secondary | ICD-10-CM | POA: Diagnosis not present

## 2015-03-18 DIAGNOSIS — E1165 Type 2 diabetes mellitus with hyperglycemia: Secondary | ICD-10-CM | POA: Diagnosis not present

## 2015-03-18 DIAGNOSIS — Z23 Encounter for immunization: Secondary | ICD-10-CM | POA: Diagnosis not present

## 2015-03-18 DIAGNOSIS — I10 Essential (primary) hypertension: Secondary | ICD-10-CM | POA: Diagnosis not present

## 2015-03-18 DIAGNOSIS — Z1389 Encounter for screening for other disorder: Secondary | ICD-10-CM | POA: Diagnosis not present

## 2015-03-18 DIAGNOSIS — E780 Pure hypercholesterolemia: Secondary | ICD-10-CM | POA: Diagnosis not present

## 2015-04-03 DIAGNOSIS — R7989 Other specified abnormal findings of blood chemistry: Secondary | ICD-10-CM | POA: Diagnosis not present

## 2015-04-03 DIAGNOSIS — E1165 Type 2 diabetes mellitus with hyperglycemia: Secondary | ICD-10-CM | POA: Diagnosis not present

## 2015-04-03 DIAGNOSIS — E780 Pure hypercholesterolemia: Secondary | ICD-10-CM | POA: Diagnosis not present

## 2015-04-03 DIAGNOSIS — R945 Abnormal results of liver function studies: Secondary | ICD-10-CM | POA: Diagnosis not present

## 2015-04-13 ENCOUNTER — Other Ambulatory Visit: Payer: Self-pay | Admitting: Family Medicine

## 2015-04-13 DIAGNOSIS — R7989 Other specified abnormal findings of blood chemistry: Secondary | ICD-10-CM

## 2015-04-13 DIAGNOSIS — R945 Abnormal results of liver function studies: Principal | ICD-10-CM

## 2015-04-15 ENCOUNTER — Ambulatory Visit
Admission: RE | Admit: 2015-04-15 | Discharge: 2015-04-15 | Disposition: A | Discharge status: Home / Self Care | Payer: Medicare Other | Source: Ambulatory Visit | Attending: Family Medicine | Admitting: Family Medicine

## 2015-04-15 DIAGNOSIS — R945 Abnormal results of liver function studies: Principal | ICD-10-CM

## 2015-04-15 DIAGNOSIS — R7989 Other specified abnormal findings of blood chemistry: Secondary | ICD-10-CM

## 2015-04-15 DIAGNOSIS — R748 Abnormal levels of other serum enzymes: Secondary | ICD-10-CM | POA: Diagnosis not present

## 2015-04-15 DIAGNOSIS — Z9049 Acquired absence of other specified parts of digestive tract: Secondary | ICD-10-CM | POA: Diagnosis not present

## 2015-05-13 ENCOUNTER — Other Ambulatory Visit: Payer: Self-pay | Admitting: Cardiovascular Disease

## 2015-05-14 ENCOUNTER — Other Ambulatory Visit: Payer: Self-pay

## 2015-05-14 MED ORDER — METOPROLOL TARTRATE 50 MG PO TABS: ORAL_TABLET | ORAL | Status: DC

## 2015-06-10 DIAGNOSIS — L658 Other specified nonscarring hair loss: Secondary | ICD-10-CM | POA: Diagnosis not present

## 2015-06-10 DIAGNOSIS — D225 Melanocytic nevi of trunk: Secondary | ICD-10-CM | POA: Diagnosis not present

## 2015-06-23 DIAGNOSIS — R972 Elevated prostate specific antigen [PSA]: Secondary | ICD-10-CM | POA: Diagnosis not present

## 2015-06-26 ENCOUNTER — Other Ambulatory Visit: Payer: Self-pay | Admitting: Cardiovascular Disease

## 2015-06-30 ENCOUNTER — Other Ambulatory Visit: Payer: Self-pay | Admitting: Cardiovascular Disease

## 2015-06-30 DIAGNOSIS — R972 Elevated prostate specific antigen [PSA]: Secondary | ICD-10-CM | POA: Diagnosis not present

## 2015-06-30 DIAGNOSIS — N400 Enlarged prostate without lower urinary tract symptoms: Secondary | ICD-10-CM | POA: Diagnosis not present

## 2015-07-01 ENCOUNTER — Other Ambulatory Visit: Payer: Self-pay | Admitting: *Deleted

## 2015-07-01 MED ORDER — ROSUVASTATIN CALCIUM 20 MG PO TABS: 20.0000 mg | ORAL_TABLET | Freq: Every day | ORAL | Status: DC

## 2015-08-04 ENCOUNTER — Other Ambulatory Visit: Payer: Self-pay | Admitting: Cardiovascular Disease

## 2015-08-06 ENCOUNTER — Other Ambulatory Visit: Payer: Self-pay

## 2015-08-06 MED ORDER — ROSUVASTATIN CALCIUM 20 MG PO TABS: 20.0000 mg | ORAL_TABLET | Freq: Every day | ORAL | Status: DC

## 2015-08-12 ENCOUNTER — Emergency Department (HOSPITAL_COMMUNITY): Payer: Medicare Other

## 2015-08-12 ENCOUNTER — Emergency Department (HOSPITAL_COMMUNITY)
Admission: EM | Admit: 2015-08-12 | Discharge: 2015-08-12 | Disposition: A | Discharge status: Home / Self Care | Payer: Medicare Other | Attending: Emergency Medicine | Admitting: Emergency Medicine

## 2015-08-12 DIAGNOSIS — Z951 Presence of aortocoronary bypass graft: Secondary | ICD-10-CM | POA: Insufficient documentation

## 2015-08-12 DIAGNOSIS — Z7982 Long term (current) use of aspirin: Secondary | ICD-10-CM | POA: Insufficient documentation

## 2015-08-12 DIAGNOSIS — N3000 Acute cystitis without hematuria: Secondary | ICD-10-CM | POA: Diagnosis not present

## 2015-08-12 DIAGNOSIS — I2510 Atherosclerotic heart disease of native coronary artery without angina pectoris: Secondary | ICD-10-CM | POA: Insufficient documentation

## 2015-08-12 DIAGNOSIS — I10 Essential (primary) hypertension: Secondary | ICD-10-CM | POA: Diagnosis not present

## 2015-08-12 DIAGNOSIS — M545 Low back pain: Secondary | ICD-10-CM | POA: Diagnosis not present

## 2015-08-12 DIAGNOSIS — Z79899 Other long term (current) drug therapy: Secondary | ICD-10-CM | POA: Insufficient documentation

## 2015-08-12 DIAGNOSIS — E7800 Pure hypercholesterolemia, unspecified: Secondary | ICD-10-CM | POA: Diagnosis not present

## 2015-08-12 DIAGNOSIS — Z9861 Coronary angioplasty status: Secondary | ICD-10-CM | POA: Diagnosis not present

## 2015-08-12 LAB — URINE MICROSCOPIC-ADD ON

## 2015-08-12 LAB — BASIC METABOLIC PANEL
ANION GAP: 9 (ref 5–15)
BUN: 9 mg/dL (ref 6–20)
CALCIUM: 9.1 mg/dL (ref 8.9–10.3)
CHLORIDE: 106 mmol/L (ref 101–111)
CO2: 24 mmol/L (ref 22–32)
CREATININE: 0.92 mg/dL (ref 0.61–1.24)
GFR calc non Af Amer: >60 mL/min (ref >60)
Glucose, Bld: 228 mg/dL — ABNORMAL HIGH (ref 65–99)
Potassium: 4 mmol/L (ref 3.5–5.1)
SODIUM: 139 mmol/L (ref 135–145)

## 2015-08-12 LAB — CBC WITH DIFFERENTIAL/PLATELET
BASOS ABS: 0 10*3/uL (ref 0.0–0.1)
BASOS PCT: 0 %
EOS ABS: 0.2 10*3/uL (ref 0.0–0.7)
Eosinophils Relative: 2 %
HEMATOCRIT: 43.3 % (ref 39.0–52.0)
HEMOGLOBIN: 14.5 g/dL (ref 13.0–17.0)
Lymphocytes Relative: 11 %
Lymphs Abs: 1.3 10*3/uL (ref 0.7–4.0)
MCH: 28.8 pg (ref 26.0–34.0)
MCHC: 33.5 g/dL (ref 30.0–36.0)
MCV: 86.1 fL (ref 78.0–100.0)
MONOS PCT: 8 %
Monocytes Absolute: 0.9 10*3/uL (ref 0.1–1.0)
NEUTROS ABS: 9.1 10*3/uL — AB (ref 1.7–7.7)
NEUTROS PCT: 79 %
Platelets: 282 10*3/uL (ref 150–400)
RBC: 5.03 MIL/uL (ref 4.22–5.81)
RDW: 13.4 % (ref 11.5–15.5)
WBC: 11.5 10*3/uL — AB (ref 4.0–10.5)

## 2015-08-12 LAB — URINALYSIS, ROUTINE W REFLEX MICROSCOPIC
BILIRUBIN URINE: NEGATIVE
Glucose, UA: >1000 mg/dL — AB
Ketones, ur: NEGATIVE mg/dL
NITRITE: NEGATIVE
PROTEIN: 30 mg/dL — AB
Specific Gravity, Urine: 1.023 (ref 1.005–1.030)
UROBILINOGEN UA: 0.2 mg/dL (ref 0.0–1.0)
pH: 6.5 (ref 5.0–8.0)

## 2015-08-12 MED ORDER — CYCLOBENZAPRINE HCL 10 MG PO TABS
10.0000 mg | ORAL_TABLET | Freq: Once | ORAL | Status: AC
  Administered 2015-08-12: 10 mg via ORAL
  Filled 2015-08-12: qty 1

## 2015-08-12 MED ORDER — HYDROMORPHONE HCL 1 MG/ML IJ SOLN
0.5000 mg | INTRAMUSCULAR | Status: AC | PRN
  Administered 2015-08-12 (×3): 0.5 mg via INTRAVENOUS
  Filled 2015-08-12 (×3): qty 1

## 2015-08-12 MED ORDER — CEPHALEXIN 500 MG PO CAPS: 500.0000 mg | ORAL_CAPSULE | Freq: Three times a day (TID) | ORAL | Status: DC

## 2015-08-12 MED ORDER — CEFTRIAXONE SODIUM 1 G IJ SOLR
1.0000 g | Freq: Once | INTRAMUSCULAR | Status: AC
  Administered 2015-08-12: 1 g via INTRAVENOUS
  Filled 2015-08-12: qty 10

## 2015-08-12 MED ORDER — DIPHENHYDRAMINE HCL 50 MG/ML IJ SOLN
25.0000 mg | Freq: Once | INTRAMUSCULAR | Status: AC
  Administered 2015-08-12: 25 mg via INTRAVENOUS
  Filled 2015-08-12: qty 1

## 2015-08-12 NOTE — ED Notes (Signed)
Pt made aware of needed urine specimen.

## 2015-08-12 NOTE — ED Notes (Signed)
Bed: WA01 Expected date:  Expected time:  Means of arrival:  Comments: ems

## 2015-08-12 NOTE — ED Provider Notes (Signed)
1600 -  Care from Dr. Tomi Bamberger. Here with back pain. Urine shows UTI, rocephin given. Will recheck with his back pain shortly. Per Dr. Tomi Bamberger, doesn't feel like he needs a CT scan as this doesn't seem like kidney stones. On recheck, he's feeling better. Keflex given. Stable for discharge.  1. Low back pain without sciatica, unspecified back pain laterality   2. Acute cystitis without hematuria      Evelina Bucy, MD 08/13/15 [phone]

## 2015-08-12 NOTE — ED Notes (Signed)
Per [facility], pt. Complaining of intermittent and severe lower back pain consistent with the last time he had fecal impaction 7 months ago. Hx of HTN with bp of 196/104.

## 2015-08-12 NOTE — Discharge Instructions (Signed)
Back Pain, Adult Back pain is very common in adults.The cause of back pain is rarely dangerous and the pain often gets better over time.The cause of your back pain may not be known. Some common causes of back pain include:  Strain of the muscles or ligaments supporting the spine.  Wear and tear (degeneration) of the spinal disks.  Arthritis.  Direct injury to the back. For many people, back pain may return. Since back pain is rarely dangerous, most people can learn to manage this condition on their own. HOME CARE INSTRUCTIONS Watch your back pain for any changes. The following actions may help to lessen any discomfort you are feeling:  Remain active. It is stressful on your back to sit or stand in one place for long periods of time. Do not sit, drive, or stand in one place for more than 30 minutes at a time. Take short walks on even surfaces as soon as you are able.Try to increase the length of time you walk each day.  Exercise regularly as directed by your health care provider. Exercise helps your back heal faster. It also helps avoid future injury by keeping your muscles strong and flexible.  Do not stay in bed.Resting more than 1-2 days can delay your recovery.  Pay attention to your body when you bend and lift. The most comfortable positions are those that put less stress on your recovering back. Always use proper lifting techniques, including:  Bending your knees.  Keeping the load close to your body.  Avoiding twisting.  Find a comfortable position to sleep. Use a firm mattress and lie on your side with your knees slightly bent. If you lie on your back, put a pillow under your knees.  Avoid feeling anxious or stressed.Stress increases muscle tension and can worsen back pain.It is important to recognize when you are anxious or stressed and learn ways to manage it, such as with exercise.  Take medicines only as directed by your health care provider. Over-the-counter  medicines to reduce pain and inflammation are often the most helpful.Your health care provider may prescribe muscle relaxant drugs.These medicines help dull your pain so you can more quickly return to your normal activities and healthy exercise.  Apply ice to the injured area:  Put ice in a plastic bag.  Place a towel between your skin and the bag.  Leave the ice on for 20 minutes, 2-3 times a day for the first 2-3 days. After that, ice and heat may be alternated to reduce pain and spasms.  Maintain a healthy weight. Excess weight puts extra stress on your back and makes it difficult to maintain good posture. SEEK MEDICAL CARE IF:  You have pain that is not relieved with rest or medicine.  You have increasing pain going down into the legs or buttocks.  You have pain that does not improve in one week.  You have night pain.  You lose weight.  You have a fever or chills. SEEK IMMEDIATE MEDICAL CARE IF:   You develop new bowel or bladder control problems.  You have unusual weakness or numbness in your arms or legs.  You develop nausea or vomiting.  You develop abdominal pain.  You feel faint.   This information is not intended to replace advice given to you by your health care provider. Make sure you discuss any questions you have with your health care provider.   Document Released: 10/03/2005 Document Revised: 10/24/2014 Document Reviewed: 02/04/2014 Elsevier Interactive Patient Education 2016 Elsevier  Inc.  Urinary Tract Infection Urinary tract infections (UTIs) can develop anywhere along your urinary tract. Your urinary tract is your body's drainage system for removing wastes and extra water. Your urinary tract includes two kidneys, two ureters, a bladder, and a urethra. Your kidneys are a pair of bean-shaped organs. Each kidney is about the size of your fist. They are located below your ribs, one on each side of your spine. CAUSES Infections are caused by microbes,  which are microscopic organisms, including fungi, viruses, and bacteria. These organisms are so small that they can only be seen through a microscope. Bacteria are the microbes that most commonly cause UTIs. SYMPTOMS  Symptoms of UTIs may vary by age and gender of the patient and by the location of the infection. Symptoms in young women typically include a frequent and intense urge to urinate and a painful, burning feeling in the bladder or urethra during urination. Older women and men are more likely to be tired, shaky, and weak and have muscle aches and abdominal pain. A fever may mean the infection is in your kidneys. Other symptoms of a kidney infection include pain in your back or sides below the ribs, nausea, and vomiting. DIAGNOSIS To diagnose a UTI, your caregiver will ask you about your symptoms. Your caregiver will also ask you to provide a urine sample. The urine sample will be tested for bacteria and white blood cells. White blood cells are made by your body to help fight infection. TREATMENT  Typically, UTIs can be treated with medication. Because most UTIs are caused by a bacterial infection, they usually can be treated with the use of antibiotics. The choice of antibiotic and length of treatment depend on your symptoms and the type of bacteria causing your infection. HOME CARE INSTRUCTIONS  If you were prescribed antibiotics, take them exactly as your caregiver instructs you. Finish the medication even if you feel better after you have only taken some of the medication.  Drink enough water and fluids to keep your urine clear or pale yellow.  Avoid caffeine, tea, and carbonated beverages. They tend to irritate your bladder.  Empty your bladder often. Avoid holding urine for long periods of time.  Empty your bladder before and after sexual intercourse.  After a bowel movement, women should cleanse from front to back. Use each tissue only once. SEEK MEDICAL CARE IF:   You have back  pain.  You develop a fever.  Your symptoms do not begin to resolve within 3 days. SEEK IMMEDIATE MEDICAL CARE IF:   You have severe back pain or lower abdominal pain.  You develop chills.  You have nausea or vomiting.  You have continued burning or discomfort with urination. MAKE SURE YOU:   Understand these instructions.  Will watch your condition.  Will get help right away if you are not doing well or get worse.   This information is not intended to replace advice given to you by your health care provider. Make sure you discuss any questions you have with your health care provider.   Document Released: 07/13/2005 Document Revised: 06/24/2015 Document Reviewed: 11/11/2011 Elsevier Interactive Patient Education [employer].

## 2015-08-12 NOTE — ED Provider Notes (Signed)
CSN: [id number]     Arrival date & time 08/12/15  1325 History   First MD Initiated Contact with Patient 08/12/15 1340     Chief Complaint  Patient presents with  . Back Pain   HPI Patient presents to the emergency room with complaints of lower back pain. Symptoms started on Sunday after a car drive. Patient's pain was sharp in his lower back area. He thought the symptoms could be related to constipation and so he gave himself an enema. Symptoms got a little bit better but then over the next few days the pain started returning and increasing in severity. The pain increases with turning certain directions or bending. This morning his cane more intense and severe he try another enema without any relief. He did have a bowel movement. Denies any trouble with abdominal pain. No vomiting. No dysuria. He denies any numbness or weakness. Past Medical History  Diagnosis Date  . Coronary atherosclerosis of artery bypass graft   . Atherosclerosis of native arteries of the extremities with intermittent claudication   . Essential hypertension, benign   . Pure hypercholesterolemia    Past Surgical History  Procedure Laterality Date  . Coronary artery bypass graft    . Hernia repair    . Cholecystectomy    . Tonsillectomy    . Adenoidectomy    . Coronary angioplasty with stent placement     No family history on file. Social History  Substance Use Topics  . Smoking status: Never Smoker   . Smokeless tobacco: Not on file  . Alcohol Use: No    Review of Systems  All other systems reviewed and are negative.     Allergies  Review of patient's allergies indicates no known allergies.  Home Medications   Prior to Admission medications   Medication Sig Start Date End Date Taking? Authorizing Provider  acetaminophen (TYLENOL) 500 MG tablet Take 1,000 mg by mouth 2 (two) times daily as needed for moderate pain or headache.   Yes Historical Provider, MD  aspirin EC 81 MG tablet Take 81 mg by  mouth daily.   Yes Historical Provider, MD  benazepril (LOTENSIN) 40 MG tablet Take 40 mg by mouth daily. 1 tab daily 04/30/12  Yes Historical Provider, MD  CINNAMON PO Take 2 capsules by mouth daily.   Yes Historical Provider, MD  hydrochlorothiazide (HYDRODIURIL) 25 MG tablet TAKE ONE TABLET BY MOUTH EVERY DAY 05/30/13  Yes Sherren Mocha, MD  metFORMIN (GLUCOPHAGE) 500 MG tablet Take 1,000 mg by mouth 2 (two) times daily. TAKE TWO TABLET IN THE MORNING , AND ONE IN THE EVENING 04/23/13  Yes Historical Provider, MD  metoprolol (LOPRESSOR) 50 MG tablet TAKE 1 AND 1/2 TABLETS TWICE A DAY 10/14/14  Yes Sherren Mocha, MD  rosuvastatin (CRESTOR) 20 MG tablet Take 1 tablet (20 mg total) by mouth daily. 08/06/15  Yes Sherren Mocha, MD  cephALEXin (KEFLEX) 500 MG capsule Take 1 capsule (500 mg total) by mouth 3 (three) times daily. 08/12/15   Evelina Bucy, MD  metoprolol (LOPRESSOR) 50 MG tablet TAKE 1 AND 1/2 TABLETS BY MOUTH TWICE A DAY (NEED APPT) 08/04/15   Sherren Mocha, MD   BP 162/73 mmHg  Pulse 72  Temp(Src) 97.7 F (36.5 C) (Oral)  Resp 13  SpO2 97% Physical Exam  Constitutional: He appears well-developed and well-nourished. No distress.  HENT:  Head: Normocephalic and atraumatic.  Right Ear: External ear normal.  Left Ear: External ear normal.  Eyes: Conjunctivae are normal. Right  eye exhibits no discharge. Left eye exhibits no discharge. No scleral icterus.  Neck: Neck supple. No tracheal deviation present.  Cardiovascular: Normal rate, regular rhythm and intact distal pulses.   Pulmonary/Chest: Effort normal and breath sounds normal. No stridor. No respiratory distress. He has no wheezes. He has no rales.  Abdominal: Soft. Bowel sounds are normal. He exhibits no distension. There is no tenderness. There is no rebound and no guarding.  Genitourinary:  Normal rectal tone, normal sensation, no fecal impaction  Musculoskeletal: He exhibits no edema or tenderness.       Lumbar back: He  exhibits decreased range of motion. He exhibits no tenderness and no bony tenderness.  Pain increases with trying to roll over onto the left side or bending  Neurological: He is alert. He has normal strength. No cranial nerve deficit (no facial droop, extraocular movements intact, no slurred speech) or sensory deficit. He exhibits normal muscle tone. He displays no seizure activity. Coordination normal.  Almost sensation bilateral lower extremities, strong plantar flexion bilaterally  Skin: Skin is warm and dry. No rash noted.  Psychiatric: He has a normal mood and affect.  Nursing note and vitals reviewed.   ED Course  Procedures (including critical care time) Labs Review Labs Reviewed  CBC WITH DIFFERENTIAL/PLATELET - Abnormal; Notable for the following:    WBC 11.5 (*)    Neutro Abs 9.1 (*)    All other components within normal limits  BASIC METABOLIC PANEL - Abnormal; Notable for the following:    Glucose, Bld 228 (*)    All other components within normal limits  URINALYSIS, ROUTINE W REFLEX MICROSCOPIC (NOT AT [facility]) - Abnormal; Notable for the following:    APPearance CLOUDY (*)    Glucose, UA >1000 (*)    Hgb urine dipstick LARGE (*)    Protein, ur 30 (*)    Leukocytes, UA MODERATE (*)    All other components within normal limits  URINE MICROSCOPIC-ADD ON - Abnormal; Notable for the following:    Bacteria, UA MANY (*)    All other components within normal limits  URINE CULTURE    Imaging Review Dg Lumbar Spine Complete  08/12/2015  CLINICAL DATA:  69 year old male with acute lumbar spine pain. No known injury. Initial encounter. EXAM: LUMBAR SPINE - COMPLETE 4+ VIEW COMPARISON:  None. FINDINGS: Five non rib-bearing lumbar type vertebra are identified. There is no evidence of acute fracture or subluxation. 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 Mild facet arthropathy in the lower lumbar spine noted. There is no evidence of acute  fracture or subluxation. No focal bony lesions or spondylolysis noted. The SI joints are unremarkable. IMPRESSION: No evidence of acute bony abnormality. Mild multilevel degenerative changes. Electronically Signed   By: Margarette Canada M.D.   On: 08/12/2015 15:40   I have personally reviewed and evaluated these images and lab results as part of my medical decision-making.    MDM   Final diagnoses:  Low back pain without sciatica, unspecified back pain laterality  Acute cystitis without hematuria    Pt's pain improved with treatment.  Suspect musculoskeletal etiology.  Xrays show DDD. UA consistent with UTI.   Doubt that this is causing the patient's pain, he is not having fever, chills, abdominal pain or other systemic symptoms.   Discussed possible CT scan to evaluate for ureteral stone.  Pt does not want to have CT scan and his symptoms are not colicky in nature.  Plan on DC home  with pain meds and abx.  Follow up with PCP    Dorie Rank, MD 08/13/15 [phone]

## 2015-08-14 LAB — URINE CULTURE: Culture: >=100000

## 2015-08-15 ENCOUNTER — Telehealth (HOSPITAL_BASED_OUTPATIENT_CLINIC_OR_DEPARTMENT_OTHER): Payer: Self-pay | Admitting: Emergency Medicine

## 2015-08-15 NOTE — Telephone Encounter (Signed)
Post ED Visit - Positive Culture Follow-up: Successful Patient Follow-Up  Culture assessed and recommendations reviewed by: []  Heide Guile, Pharm.D., BCPS-AQ ID []  Alycia Rossetti, Pharm.D., BCPS []  [place], [place].D., BCPS, AAHIVP []  Legrand Como, Pharm.D., BCPS, AAHIVP []  [place], Pharm.D. []  Milus Glazier, Pharm.D. [place] PharmD  Positive urine culture Enterococcus  []  Patient discharged without antimicrobial prescription and treatment is now indicated [x]  Organism is resistant to prescribed ED discharge antimicrobial []  Patient with positive blood cultures  Changes discussed with ED provider: Amie Portland PA New antibiotic prescription  Stop keflex start amoxicillin 1 gram po bid x 10 days  Contacted patient, 08/15/15 1153   Hazle Nordmann 08/15/2015, 11:57 AM

## 2015-08-15 NOTE — Progress Notes (Signed)
ED Antimicrobial Stewardship Positive Culture Follow Up   [name] [name] is an 69 y.o. male who presented to [facility] on 08/12/2015 with a chief complaint of  Chief Complaint  Patient presents with  . Back Pain    Recent Results (from the past 720 hour(s))  Urine culture     Status: None   Collection Time: 08/12/15  4:41 PM  Result Value Ref Range Status   Specimen Description URINE, CLEAN CATCH  Final   Special Requests NONE  Final   Culture   Final    >=100,000 COLONIES/mL ENTEROCOCCUS SPECIES Performed at [facility]    Report Status 08/14/2015 FINAL  Final   Organism ID, Bacteria ENTEROCOCCUS SPECIES  Final      Susceptibility   Enterococcus species - MIC*    AMPICILLIN <=2 SENSITIVE Sensitive     LEVOFLOXACIN 1 SENSITIVE Sensitive     NITROFURANTOIN <=16 SENSITIVE Sensitive     VANCOMYCIN 1 SENSITIVE Sensitive     * >=100,000 COLONIES/mL ENTEROCOCCUS SPECIES    [x]  Treated with cephalexin, organism resistant to prescribed antimicrobial []  Patient discharged originally without antimicrobial agent and treatment is now indicated  New antibiotic prescription: D/C Cephalexin and start amoxicillin 1 gm BID X 10 days  ED Provider: Josephina Gip, PA-C  Wynell Balloon 08/15/2015, 8:50 AM Infectious Diseases Pharmacist Phone# [phone]

## 2015-09-08 ENCOUNTER — Telehealth: Payer: Self-pay | Admitting: Cardiovascular Disease

## 2015-09-08 DIAGNOSIS — N390 Urinary tract infection, site not specified: Secondary | ICD-10-CM | POA: Diagnosis not present

## 2015-09-08 DIAGNOSIS — R011 Cardiac murmur, unspecified: Secondary | ICD-10-CM | POA: Insufficient documentation

## 2015-09-08 DIAGNOSIS — I10 Essential (primary) hypertension: Secondary | ICD-10-CM | POA: Diagnosis not present

## 2015-09-08 NOTE — Telephone Encounter (Signed)
New message      Calling to see if pt has a diagnosis of heart murmur.  Dr Zadie Rhine heard a murmur today and they do not have any record of a workup.

## 2015-09-08 NOTE — Telephone Encounter (Signed)
Reviewed chart and no indication of heart murmur per Dr Antionette Char assessment.  No Echo reports on file.  I left [place] a message with this information and advised that the pt contact our office to arrange a follow-up appointment with Dr [name].

## 2015-09-09 NOTE — Telephone Encounter (Signed)
New Message   Pt wants a sooner appt, he declined first available appt  He states he will get his PCP office to send   over the information on his Heart murmmur

## 2015-09-09 NOTE — Telephone Encounter (Signed)
I spoke with the pt and scheduled appointment with Dr [name] 09/25/15.

## 2015-09-14 ENCOUNTER — Other Ambulatory Visit: Payer: Self-pay | Admitting: Cardiovascular Disease

## 2015-09-24 ENCOUNTER — Other Ambulatory Visit: Payer: Self-pay | Admitting: Cardiovascular Disease

## 2015-09-24 NOTE — Telephone Encounter (Signed)
Per last office visit, lipids are followed by pcp. Patient has an appointment with Dr [name] tomorrow. Please advise. Thanks, MI

## 2015-09-25 ENCOUNTER — Ambulatory Visit (INDEPENDENT_AMBULATORY_CARE_PROVIDER_SITE_OTHER): Payer: Medicare Other | Admitting: Cardiovascular Disease

## 2015-09-25 ENCOUNTER — Encounter: Payer: Self-pay | Admitting: Cardiovascular Disease

## 2015-09-25 VITALS — BP 170/100 | HR 100 | Ht 69.0 in | Wt 214.8 lb

## 2015-09-25 DIAGNOSIS — R011 Cardiac murmur, unspecified: Secondary | ICD-10-CM

## 2015-09-25 DIAGNOSIS — I10 Essential (primary) hypertension: Secondary | ICD-10-CM | POA: Diagnosis not present

## 2015-09-25 DIAGNOSIS — E785 Hyperlipidemia, unspecified: Secondary | ICD-10-CM | POA: Diagnosis not present

## 2015-09-25 LAB — HEPATIC FUNCTION PANEL
ALT: 38 U/L (ref 9–46)
AST: 43 U/L — ABNORMAL HIGH (ref 10–35)
Albumin: 4.2 g/dL (ref 3.6–5.1)
Alkaline Phosphatase: 80 U/L (ref 40–115)
BILIRUBIN INDIRECT: 0.4 mg/dL (ref 0.2–1.2)
Bilirubin, Direct: 0.1 mg/dL (ref <=0.2)
TOTAL PROTEIN: 8.1 g/dL (ref 6.1–8.1)
Total Bilirubin: 0.5 mg/dL (ref 0.2–1.2)

## 2015-09-25 LAB — LIPID PANEL
Cholesterol: 129 mg/dL (ref 125–200)
HDL: 32 mg/dL — ABNORMAL LOW (ref >=40)
LDL CALC: 70 mg/dL (ref <130)
TRIGLYCERIDES: 137 mg/dL (ref <150)
Total CHOL/HDL Ratio: 4 Ratio (ref <=5.0)
VLDL: 27 mg/dL (ref <30)

## 2015-09-25 NOTE — Progress Notes (Signed)
Cardiology Office Note Date:  09/25/2015   ID:  Leotha, Voeltz 1946-01-06, MRN [id number]  PCP:  Gerrit Heck, MD  Cardiologist:  Sherren Mocha, MD    Chief Complaint  Patient presents with  . Heart Murmur    History of Present Illness: [name] [name] is a 69 y.o. male with PMH as below who presents for evaluation of a new murmur.  Reportedly told about this in ER when he was evaluated for low back pain.  PCP confirmed this finding.  He has been followed regularly for coronary artery disease status post CABG in 2009. He underwent stenting at the distal anastomotic site of one of the saphenous vein graft early after surgery when he presented with a non-ST elevation MI. His last heart catheterization was in 2012 and this demonstrated patency of all of his bypass grafts.  He denies chest pain, palpitations, dyspnea, PND, orthopnea, LE edema, lightheadedness or syncope.  He played football in school and has no hx of exertional syncope.  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  He has no exertional symptoms.  He is a never smoker.  No family hx of murmurs, valvular abnormalities, sudden cardiac death.   Past Medical History  Diagnosis Date  . Coronary atherosclerosis of artery bypass graft   . Atherosclerosis of native arteries of the extremities with intermittent claudication   . Essential hypertension, benign   . Pure hypercholesterolemia     Past Surgical History  Procedure Laterality Date  . Coronary artery bypass graft    . Hernia repair    . Cholecystectomy    . Tonsillectomy    . Adenoidectomy    . Coronary angioplasty with stent placement      Current Outpatient Prescriptions  Medication Sig Dispense Refill  . acetaminophen (TYLENOL) 500 MG tablet Take 1,000 mg by mouth 2 (two) times daily as needed for moderate pain or headache.    Marland Kitchen aspirin EC 81 MG tablet Take 81 mg by mouth daily.    . benazepril (LOTENSIN) 40  MG tablet Take 40 mg by mouth daily. 1 tab daily    . cephALEXin (KEFLEX) 500 MG capsule Take 1 capsule (500 mg total) by mouth 3 (three) times daily. 30 capsule 0  . CINNAMON PO Take 2 capsules by mouth daily.    . hydrochlorothiazide (HYDRODIURIL) 25 MG tablet TAKE ONE TABLET BY MOUTH EVERY DAY 30 tablet 6  . metFORMIN (GLUCOPHAGE) 500 MG tablet Take 1,000 mg by mouth 2 (two) times daily. TAKE TWO TABLET IN THE MORNING , AND ONE IN THE EVENING    . metoprolol (LOPRESSOR) 50 MG tablet TAKE 1 AND 1/2 TABLETS TWICE A DAY 90 tablet 3  . metoprolol (LOPRESSOR) 50 MG tablet TAKE 1 AND 1/2 TABLETS BY MOUTH TWICE A DAY (NEED APPT) 90 tablet 0  . rosuvastatin (CRESTOR) 20 MG tablet Take 1 tablet (20 mg total) by mouth daily. 30 tablet 0   No current facility-administered medications for this visit.    Allergies:   Review of patient's allergies indicates no known allergies.   Social History:  The patient  reports that he has never smoked. He does not have any smokeless tobacco history on file. He reports that he does not drink alcohol or use illicit drugs.   Family History:  The patient's  family history is not on file.    ROS:  Please see the history of present illness. Denies appetite change,  fevers/chills, unexplained weight loss, diarrhea, difficulty urinating.   PHYSICAL EXAM: VS:  BP 170/100 mmHg  Pulse 100  Ht 5' 9"  (1.753 m)  Wt 214 lb 12.8 oz (97.433 kg)  BMI 31.71 kg/m2 , BMI There is no weight on file to calculate BMI. GEN: Well nourished, well developed, in no acute distress HEENT: normal Neck: no JVD, no masses. No carotid bruits.   Cardiac: RRR without gallop.  There is a 2/6 systolic murmur radiating to the carotids            Respiratory:  clear to auscultation bilaterally, normal work of breathing GI: soft, nontender, nondistended, + BS MS: no deformity or atrophy Ext: no pretibial edema, pedal pulses 2+=bilaterally Skin: warm and dry, no rash Neuro:  Grossly  intact Psych: euthymic mood, full affect  EKG:  EKG is ordered today. The ekg ordered today shows NSR, 100bpm, within normal limits  Recent Labs: 08/12/2015: BUN 9; Creatinine, Ser 0.92; Hemoglobin 14.5; Platelets 282; Potassium 4.0; Sodium 139   Lipid Panel     Component Value Date/Time   CHOL 166 05/10/2010 0000   TRIG 126.0 05/10/2010 0000   HDL 41.00 05/10/2010 0000   CHOLHDL 4 05/10/2010 0000   VLDL 25.2 05/10/2010 0000   LDLCALC 100* 05/10/2010 0000      Wt Readings from Last 3 Encounters:  05/22/14 212 lb (96.163 kg)  05/29/13 215 lb (97.523 kg)  07/30/12 217 lb (98.431 kg)     Cardiac Studies Reviewed: No new studies.  ASSESSMENT AND PLAN: 1.  New murmur:  2/6 systolic murmur heard in all positions and radiating to carotids.  This appears to be new.  He is asymptomatic.  This is likely benign flow vs mild AS.  Will obtain 2D ECHO to evaluate for valvular disease/etiology of murmur.  2. Coronary artery disease, native vessel:  Asymptomatic.  Compliant with medications.  Will continue ASA, Crestor, blood pressure and DM control.  He is due for labs with primary but would like lipids drawn today since he is fasting.  3. Hypertension:  BP elevated at 170/100 and he reports he forgot to take his medications this AM.  I have asked him to monitor his BP at home for two weeks.  He will send me the readings so med adjustments can be made if needed. BP's reviewed over several office visits and generally well-controlled.  4.  Hyperlipidemia:  On high intensity statin.  No recent labs and he is fasting so will check LDL and hepatic function today.  Continue Crestor 49m daily.   5. Lower extremity PAD:  Improved and able to walk 1.5 miles withtout problem.  Never smoker.  Continue walking.  Current medicines are reviewed with the patient today.  The patient does not have concerns regarding medicines.  Labs/ tests ordered today include: 2D ECHO, fasting lipid panel, hepatic  function test No orders of the defined types were placed in this encounter.    Disposition:   FU in 1 year.  The patient is seen in conjunction with Dr WRedmond Pulling I have made changes above where indicated. Mr CHowerteris well-known to me, now with new heart murmur. See discussion/exam findings above. Will review echo when completed. Otherwise he appears stable from a cardiac perspective and will call in with home BP readings in 2 weeks.  SDeatra James MD  09/25/2015 10:37 AM    [facility] [address]  [id number]: [phone] Fax: [phone]   

## 2015-09-25 NOTE — Patient Instructions (Addendum)
Medication Instructions:   Your physician recommends that you continue on your current medications as directed. Please refer to the Current Medication list given to you today.   If you need a refill on your cardiac medications before your next appointment, please call your pharmacy.  Labwork:  LIPID LFT    Testing/Procedures:  Your physician has requested that you have an echocardiogram. Echocardiography is a painless test that uses sound waves to create images of your heart. It provides your doctor with information about the size and shape of your heart and how well your heart's chambers and valves are working. This procedure takes approximately one hour. There are no restrictions for this procedure.     Follow-Up:  Your physician wants you to follow-up in: [place] will receive a reminder letter in the mail two months in advance. If you don't receive a letter, please call our office to schedule the follow-up appointment.     Any Other Special Instructions Will Be Listed Below (If Applicable).  KEEP A DAILY RECORD OF BLOOD PRESSURE AND CALL OFFICE  WITH RESULTS IN 2 WEEKS

## 2015-09-28 NOTE — Telephone Encounter (Signed)
Pt had labs drawn 09/25/15.  No change in Crestor.

## 2015-10-01 ENCOUNTER — Other Ambulatory Visit: Payer: Self-pay

## 2015-10-01 DIAGNOSIS — R748 Abnormal levels of other serum enzymes: Secondary | ICD-10-CM

## 2015-10-09 ENCOUNTER — Other Ambulatory Visit: Payer: Self-pay

## 2015-10-09 ENCOUNTER — Ambulatory Visit (HOSPITAL_COMMUNITY): Payer: Medicare Other | Attending: Cardiology

## 2015-10-09 DIAGNOSIS — I10 Essential (primary) hypertension: Secondary | ICD-10-CM | POA: Insufficient documentation

## 2015-10-09 DIAGNOSIS — I253 Aneurysm of heart: Secondary | ICD-10-CM | POA: Diagnosis not present

## 2015-10-09 DIAGNOSIS — I352 Nonrheumatic aortic (valve) stenosis with insufficiency: Secondary | ICD-10-CM | POA: Diagnosis not present

## 2015-10-09 DIAGNOSIS — R011 Cardiac murmur, unspecified: Secondary | ICD-10-CM

## 2015-10-09 DIAGNOSIS — I5189 Other ill-defined heart diseases: Secondary | ICD-10-CM | POA: Insufficient documentation

## 2015-10-09 DIAGNOSIS — E785 Hyperlipidemia, unspecified: Secondary | ICD-10-CM | POA: Insufficient documentation

## 2015-10-09 DIAGNOSIS — I517 Cardiomegaly: Secondary | ICD-10-CM | POA: Diagnosis not present

## 2015-10-09 DIAGNOSIS — R079 Chest pain, unspecified: Secondary | ICD-10-CM | POA: Diagnosis present

## 2015-10-14 ENCOUNTER — Other Ambulatory Visit: Payer: Self-pay | Admitting: Cardiovascular Disease

## 2015-11-18 DIAGNOSIS — E1165 Type 2 diabetes mellitus with hyperglycemia: Secondary | ICD-10-CM | POA: Diagnosis not present

## 2015-11-18 DIAGNOSIS — E7800 Pure hypercholesterolemia, unspecified: Secondary | ICD-10-CM | POA: Diagnosis not present

## 2015-11-18 DIAGNOSIS — Z7984 Long term (current) use of oral hypoglycemic drugs: Secondary | ICD-10-CM | POA: Diagnosis not present

## 2015-11-18 DIAGNOSIS — N390 Urinary tract infection, site not specified: Secondary | ICD-10-CM | POA: Diagnosis not present

## 2016-01-13 ENCOUNTER — Other Ambulatory Visit: Payer: Self-pay | Admitting: Cardiovascular Disease

## 2016-03-28 ENCOUNTER — Other Ambulatory Visit (INDEPENDENT_AMBULATORY_CARE_PROVIDER_SITE_OTHER): Payer: Medicare Other | Admitting: *Deleted

## 2016-03-28 DIAGNOSIS — R748 Abnormal levels of other serum enzymes: Secondary | ICD-10-CM

## 2016-03-28 LAB — HEPATIC FUNCTION PANEL
ALBUMIN: 4 g/dL (ref 3.6–5.1)
ALT: 50 U/L — AB (ref 9–46)
AST: 53 U/L — ABNORMAL HIGH (ref 10–35)
Alkaline Phosphatase: 81 U/L (ref 40–115)
BILIRUBIN DIRECT: 0.1 mg/dL (ref <=0.2)
Indirect Bilirubin: 0.4 mg/dL (ref 0.2–1.2)
TOTAL PROTEIN: 6.8 g/dL (ref 6.1–8.1)
Total Bilirubin: 0.5 mg/dL (ref 0.2–1.2)

## 2016-04-07 ENCOUNTER — Other Ambulatory Visit: Payer: Self-pay | Admitting: Cardiovascular Disease

## 2016-05-20 DIAGNOSIS — E1165 Type 2 diabetes mellitus with hyperglycemia: Secondary | ICD-10-CM | POA: Diagnosis not present

## 2016-05-20 DIAGNOSIS — Z1389 Encounter for screening for other disorder: Secondary | ICD-10-CM | POA: Diagnosis not present

## 2016-05-20 DIAGNOSIS — Z0000 Encounter for general adult medical examination without abnormal findings: Secondary | ICD-10-CM | POA: Diagnosis not present

## 2016-05-20 DIAGNOSIS — R945 Abnormal results of liver function studies: Secondary | ICD-10-CM | POA: Diagnosis not present

## 2016-05-20 DIAGNOSIS — E7800 Pure hypercholesterolemia, unspecified: Secondary | ICD-10-CM | POA: Diagnosis not present

## 2016-05-20 DIAGNOSIS — Z7984 Long term (current) use of oral hypoglycemic drugs: Secondary | ICD-10-CM | POA: Diagnosis not present

## 2016-05-20 DIAGNOSIS — I10 Essential (primary) hypertension: Secondary | ICD-10-CM | POA: Diagnosis not present

## 2016-05-20 DIAGNOSIS — R011 Cardiac murmur, unspecified: Secondary | ICD-10-CM | POA: Diagnosis not present

## 2016-05-23 ENCOUNTER — Other Ambulatory Visit: Payer: Self-pay | Admitting: Family Medicine

## 2016-05-23 DIAGNOSIS — R011 Cardiac murmur, unspecified: Secondary | ICD-10-CM

## 2016-05-30 ENCOUNTER — Other Ambulatory Visit: Payer: Self-pay

## 2016-05-30 ENCOUNTER — Ambulatory Visit (HOSPITAL_COMMUNITY): Payer: Medicare Other | Attending: Cardiovascular Disease

## 2016-05-30 ENCOUNTER — Encounter (INDEPENDENT_AMBULATORY_CARE_PROVIDER_SITE_OTHER): Payer: Self-pay

## 2016-05-30 DIAGNOSIS — I350 Nonrheumatic aortic (valve) stenosis: Secondary | ICD-10-CM | POA: Diagnosis not present

## 2016-05-30 DIAGNOSIS — I2510 Atherosclerotic heart disease of native coronary artery without angina pectoris: Secondary | ICD-10-CM | POA: Insufficient documentation

## 2016-05-30 DIAGNOSIS — I119 Hypertensive heart disease without heart failure: Secondary | ICD-10-CM | POA: Insufficient documentation

## 2016-05-30 DIAGNOSIS — E785 Hyperlipidemia, unspecified: Secondary | ICD-10-CM | POA: Diagnosis not present

## 2016-05-30 DIAGNOSIS — R011 Cardiac murmur, unspecified: Secondary | ICD-10-CM | POA: Insufficient documentation

## 2016-06-03 ENCOUNTER — Other Ambulatory Visit: Payer: Self-pay | Admitting: Cardiovascular Disease

## 2016-06-10 ENCOUNTER — Encounter: Payer: Self-pay | Admitting: Physician Assistant

## 2016-06-10 DIAGNOSIS — H2513 Age-related nuclear cataract, bilateral: Secondary | ICD-10-CM | POA: Diagnosis not present

## 2016-06-10 DIAGNOSIS — H524 Presbyopia: Secondary | ICD-10-CM | POA: Diagnosis not present

## 2016-06-10 DIAGNOSIS — H52202 Unspecified astigmatism, left eye: Secondary | ICD-10-CM | POA: Diagnosis not present

## 2016-06-10 DIAGNOSIS — Z794 Long term (current) use of insulin: Secondary | ICD-10-CM | POA: Insufficient documentation

## 2016-06-10 DIAGNOSIS — E119 Type 2 diabetes mellitus without complications: Secondary | ICD-10-CM | POA: Insufficient documentation

## 2016-06-12 NOTE — Progress Notes (Signed)
Cardiology Office Note    Date:  06/13/2016   ID:  [name], Obar [birth year], MRN [id number]  PCP:  Gerrit Heck, MD  Cardiologist:  Dr. [name]  CC: one year follow up   History of Present Illness:  [name] [name] is a 70 y.o. male with a history of CAD s/p CABG 2009 and PCI to SVG 2009, HTN, HLD, PAD, and mild AS who presents to clinic for follow up.   He has been followed regularly for coronary artery disease status post CABG in 2009. He underwent stenting at the distal anastomotic site of one of the saphenous vein graft early after surgery when he presented with a non-ST elevation MI. His last heart catheterization was in 2012 and this demonstrated patency of all of his bypass grafts.  He was seen by Dr. [name] in the office in 09/2015 for follow up and evaluation of a new murmur. A subsequent 2D ECHO showed mild AS.   Today he presents to clinic for follow up. He was exercising until he started working again. 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 He was exercising with no issues before this with no chest pain or SOB. He has brought a BP log that shows BP have been running any where from 138/64--> 182/80. Average ~ 150-160 SBP. HR in 50-60s. He was worried about his heart because his PCP had done another echo recently that showed mild AS. No LE edema, orthopnea or PND. No dizziness or syncope. Can walk 1.5 miles still fine with only some minor leg pain.    Past Medical History:  Diagnosis Date  . Arrhythmia   . Atherosclerosis of native arteries of the extremities with intermittent claudication   . Coronary atherosclerosis of artery bypass graft   . Essential hypertension, benign   . Heart murmur   . Pure hypercholesterolemia     Past Surgical History:  Procedure Laterality Date  . ADENOIDECTOMY    . CHOLECYSTECTOMY    . CORONARY ANGIOPLASTY WITH STENT PLACEMENT    . CORONARY ARTERY BYPASS GRAFT    . HERNIA REPAIR    . TONSILLECTOMY       Current Medications: Outpatient Medications Prior to Visit  Medication Sig Dispense Refill  . aspirin EC 81 MG tablet Take 81 mg by mouth daily.    . benazepril (LOTENSIN) 40 MG tablet Take 40 mg by mouth daily. 1 tab daily    . CINNAMON PO Take 2 capsules by mouth daily.    . hydrochlorothiazide (HYDRODIURIL) 25 MG tablet TAKE ONE TABLET BY MOUTH EVERY DAY 30 tablet 6  . metFORMIN (GLUCOPHAGE) 500 MG tablet Take 1,000 mg by mouth 2 (two) times daily. TAKE TWO TABLET IN THE MORNING , AND two IN THE EVENING    . metoprolol (LOPRESSOR) 50 MG tablet Take 1.5 tablets (75 mg total) by mouth 2 (two) times daily. 90 tablet 3  . rosuvastatin (CRESTOR) 20 MG tablet TAKE 1 TABLET (20 MG TOTAL) BY MOUTH DAILY. 30 tablet 11   No facility-administered medications prior to visit.      Allergies:   Other   Social History   Social History  . Marital status: Married    Spouse name: N/A  . Number of children: N/A  . Years of education: N/A   Social History Main Topics  . Smoking status: Never Smoker  . Smokeless tobacco: Never Used  . Alcohol use No  . Drug use: No  .  Sexual activity: Not Asked   Other Topics Concern  . None   Social History Narrative  . None     Family History:  The patient's Family history is unknown by patient.     ROS:   Please see the history of present illness.    ROS All other systems reviewed and are negative.   PHYSICAL EXAM:   VS:  BP (!) 170/84   Pulse 74   Ht 5' 9"  (1.753 m)   Wt 212 lb 12.8 oz (96.5 kg)   BMI 31.43 kg/m    GEN: Well nourished, well developed, in no acute distress  HEENT: normal  Neck: no JVD, carotid bruits, or masses Cardiac: RRR; SEM at RUSB, rubs, or gallops,no edema  Respiratory:  clear to auscultation bilaterally, normal work of breathing GI: soft, nontender, nondistended, + BS MS: no deformity or atrophy  Skin: warm and dry, no rash Neuro:  Alert and Oriented x 3, Strength and sensation are intact Psych: euthymic  mood, full affect  Wt Readings from Last 3 Encounters:  06/13/16 212 lb 12.8 oz (96.5 kg)  09/25/15 214 lb 12.8 oz (97.4 kg)  05/22/14 212 lb (96.2 kg)      Studies/Labs Reviewed:   EKG:  EKG is NOT ordered today.   Recent Labs: 08/12/2015: BUN 9; Creatinine, Ser 0.92; Hemoglobin 14.5; Platelets 282; Potassium 4.0; Sodium 139 03/28/2016: ALT 50   Lipid Panel    Component Value Date/Time   CHOL 129 09/25/2015 1154   TRIG 137 09/25/2015 1154   HDL 32 (L) 09/25/2015 1154   CHOLHDL 4.0 09/25/2015 1154   VLDL 27 09/25/2015 1154   LDLCALC 70 09/25/2015 1154    Additional studies/ records that were reviewed today include:  2D ECHO: 05/30/2016 LV EF: 55% -   60% Study Conclusions - Left ventricle: The cavity size was normal. Wall thickness was   increased in a pattern of mild LVH. Systolic function was normal.   The estimated ejection fraction was in the range of 55% to 60%.   Wall motion was normal; there were no regional wall motion   abnormalities. Doppler parameters are consistent with abnormal   left ventricular relaxation (grade 1 diastolic dysfunction). - Aortic valve: There was mild stenosis.    ASSESSMENT & PLAN:   [name] [name] is a 70 y.o. male with a history of CAD s/p CABG 2009 and PCI to SVG , HTN, HLD, PAD, and mild AS who presents to clinic for follow up.   Coronary artery disease, native vessel:  Asymptomatic.  Compliant with medications.  Will continue ASA, Crestor.   HTN:  BP has been uncontrolled on current regimen of Lopressor tart 75 mg BID, HCTZ 98m daily, Benazepril 40 mg daily. Will add amlodipine 5 mg daily and have him continue to monitor his BPS. If they remain >140/90 he will call [place] and we will increase to  111mdaily.  HLD:  On high intensity statin. LDL at goal in 09/2015. AST was a little elevated and repeat labs in 6 months recommended. Will have this completed with repeat fasting lipids. He has blood work with Dr. BaDrema Dallasn Friday.  I will put in orders and have it drawn at same time.   Lower extremity PAD:  Improved and able to walk 1.5 miles withtout problem.  Never smoker.  Continue walking.  Mild AS: stable by most recent echo in 05/2016. Follow clinically.    Medication Adjustments/Labs and Tests Ordered: Current medicines are  reviewed at length with the patient today.  Concerns regarding medicines are outlined above.  Medication changes, Labs and Tests ordered today are listed in the Patient Instructions below. Patient Instructions  Medication Instructions:  Your physician has recommended you make the following change in your medication:  1.  START Amlodipine 5 mg taking 1 daily   Labwork: AT PCP OFFICE:  LIPID & LFT  Testing/Procedures: None ordered  Follow-Up: Your physician wants you to follow-up in: [place].  You will receive a reminder letter in the mail two months in advance. If you don't receive a letter, please call our office to schedule the follow-up appointment.   Any Other Special Instructions Will Be Listed Below (If Applicable). Your physician has requested that you regularly monitor and record your blood pressure readings at home. Please use the same machine at the same time of day to check your readings and record them to bring to your follow-up visit.  If your blood runs higher than 140/90 then you will need to call our office.     If you need a refill on your cardiac medications before your next appointment, please call your pharmacy.      Signed, Angelena Form, PA-C  06/13/2016 11:12 AM    [facility] [place], [place], [place]  [address] Phone: [phone]; Fax: [phone]

## 2016-06-13 ENCOUNTER — Ambulatory Visit (INDEPENDENT_AMBULATORY_CARE_PROVIDER_SITE_OTHER): Payer: Medicare Other | Admitting: Physician Assistant

## 2016-06-13 ENCOUNTER — Encounter: Payer: Self-pay | Admitting: Physician Assistant

## 2016-06-13 VITALS — BP 170/84 | HR 74 | Ht 69.0 in | Wt 212.8 lb

## 2016-06-13 DIAGNOSIS — I350 Nonrheumatic aortic (valve) stenosis: Secondary | ICD-10-CM

## 2016-06-13 DIAGNOSIS — E785 Hyperlipidemia, unspecified: Secondary | ICD-10-CM

## 2016-06-13 DIAGNOSIS — I739 Peripheral vascular disease, unspecified: Secondary | ICD-10-CM

## 2016-06-13 DIAGNOSIS — I10 Essential (primary) hypertension: Secondary | ICD-10-CM | POA: Diagnosis not present

## 2016-06-13 DIAGNOSIS — I2510 Atherosclerotic heart disease of native coronary artery without angina pectoris: Secondary | ICD-10-CM | POA: Diagnosis not present

## 2016-06-13 MED ORDER — AMLODIPINE BESYLATE 5 MG PO TABS: 5.0000 mg | ORAL_TABLET | Freq: Every day | ORAL | 3 refills | Status: DC

## 2016-06-13 MED ORDER — NITROGLYCERIN 0.4 MG SL SUBL: 0.4000 mg | SUBLINGUAL_TABLET | SUBLINGUAL | 3 refills | Status: DC | PRN

## 2016-06-13 NOTE — Patient Instructions (Signed)
Medication Instructions:  Your physician has recommended you make the following change in your medication:  1.  START Amlodipine 5 mg taking 1 daily   Labwork: AT PCP OFFICE:  LIPID & LFT  Testing/Procedures: None ordered  Follow-Up: Your physician wants you to follow-up in: [place].  You will receive a reminder letter in the mail two months in advance. If you don't receive a letter, please call our office to schedule the follow-up appointment.   Any Other Special Instructions Will Be Listed Below (If Applicable). Your physician has requested that you regularly monitor and record your blood pressure readings at home. Please use the same machine at the same time of day to check your readings and record them to bring to your follow-up visit.  If your blood runs higher than 140/90 then you will need to call our office.     If you need a refill on your cardiac medications before your next appointment, please call your pharmacy.

## 2016-06-15 ENCOUNTER — Other Ambulatory Visit: Payer: Self-pay | Admitting: Physician Assistant

## 2016-06-15 MED ORDER — HYDROCHLOROTHIAZIDE 25 MG PO TABS: 25.0000 mg | ORAL_TABLET | Freq: Every day | ORAL | 11 refills | Status: DC

## 2016-07-14 ENCOUNTER — Ambulatory Visit (INDEPENDENT_AMBULATORY_CARE_PROVIDER_SITE_OTHER): Payer: Medicare Other | Admitting: Cardiology

## 2016-07-14 ENCOUNTER — Telehealth: Payer: Self-pay | Admitting: Cardiovascular Disease

## 2016-07-14 ENCOUNTER — Encounter: Payer: Self-pay | Admitting: Cardiology

## 2016-07-14 VITALS — BP 134/78 | HR 80 | Ht 69.0 in | Wt 213.8 lb

## 2016-07-14 DIAGNOSIS — I209 Angina pectoris, unspecified: Secondary | ICD-10-CM

## 2016-07-14 DIAGNOSIS — R079 Chest pain, unspecified: Secondary | ICD-10-CM

## 2016-07-14 DIAGNOSIS — I208 Other forms of angina pectoris: Secondary | ICD-10-CM

## 2016-07-14 MED ORDER — NITROGLYCERIN 0.4 MG/SPRAY TL SOLN: 1.0000 | Status: DC | PRN

## 2016-07-14 MED ORDER — NITROGLYCERIN 0.4 MG/SPRAY TL SOLN: 1.0000 | 1 refills | Status: DC | PRN

## 2016-07-14 MED ORDER — ISOSORBIDE MONONITRATE ER 30 MG PO TB24: 30.0000 mg | ORAL_TABLET | Freq: Every day | ORAL | 3 refills | Status: DC

## 2016-07-14 NOTE — Progress Notes (Signed)
07/14/2016 [name] [name]   [birth year]  [id number]  Primary Physician Gerrit Heck, MD Primary Cardiologist: Dr. [name]   Reason for Visit/CC: Exertional CP  HPI:  [name] [name] is a 70 y.o. male with a history of CAD s/p CABG in 2009 and PCI to SVG 2009, HTN, HLD, PAD, and mild AS.  He has been followed regularly forcoronary artery disease status post CABG in 2009. He underwent stenting at the distal anastomotic site of one of the saphenous vein graft early after surgery when he presented with a non-ST elevation MI. His last heart catheterization was in 2012 and this demonstrated patency of all of his bypass grafts.  He was seen by Dr. [name] in the office in 09/2015 for follow up and evaluation of a new murmur. A subsequent 2D ECHO showed mild AS.   He presents to clinic today with a complaint of new exertional chest discomfort. Symptoms have been occurring for the past week. He denies resting chest pain. Symptoms occur when he's on the treadmill, walking a long distance or attempting to walk up a flight of stairs. He notes substernal chest tightness. Nonradiating. No associated dyspnea. Symptoms are relieved with rest. He has not had to use sublingual nitroglycerin. Despite the presence of typical symptoms, he reports that his chest discomfort is actually different from the chest pain he experienced prior to CABG and prior to receiving a stent. That particular chest discomfort was more of a sharp pain. He denies any recent sharp pain. His recent discomfort has also been less severe than previous angina attacks.  He is currently chest pain-free in clinic today. EKG shows NSR. No ischemia. Blood pressure is stable at 134/78. Pulse rate 75 bpm. He reports full medication compliance. He is on aspirin, Lopressor, Lotensin, Crestor and Norvasc. He also has diabetes and is on metformin. He denies any use of medications for ED. He does not smoke.    Current Meds  Medication Sig  .  amLODipine (NORVASC) 5 MG tablet Take 1 tablet (5 mg total) by mouth daily.  Marland Kitchen aspirin EC 81 MG tablet Take 81 mg by mouth daily.  . benazepril (LOTENSIN) 40 MG tablet Take 40 mg by mouth daily. 1 tab daily  . CINNAMON PO Take 2 capsules by mouth daily.  Marland Kitchen glimepiride (AMARYL) 1 MG tablet Take 1 mg by mouth daily.  . hydrochlorothiazide (HYDRODIURIL) 25 MG tablet Take 1 tablet (25 mg total) by mouth daily.  . metFORMIN (GLUCOPHAGE) 500 MG tablet Take 1,000 mg by mouth 2 (two) times daily. TAKE TWO TABLET IN THE MORNING , AND two IN THE EVENING  . metoprolol (LOPRESSOR) 50 MG tablet Take 1.5 tablets (75 mg total) by mouth 2 (two) times daily.  . nitroGLYCERIN (NITROSTAT) 0.4 MG SL tablet Place 1 tablet (0.4 mg total) under the tongue every 5 (five) minutes as needed for chest pain.  . rosuvastatin (CRESTOR) 20 MG tablet TAKE 1 TABLET (20 MG TOTAL) BY MOUTH DAILY.   Allergies  Allergen Reactions  . Other     Other reaction(s): Unknown   Past Medical History:  Diagnosis Date  . Arrhythmia   . Atherosclerosis of native arteries of the extremities with intermittent claudication   . Coronary atherosclerosis of artery bypass graft   . Essential hypertension, benign   . Heart murmur   . Pure hypercholesterolemia    Family History  Problem Relation Age of Onset  . Family history unknown: Yes   Past Surgical History:  Procedure  Laterality Date  . ADENOIDECTOMY    . CHOLECYSTECTOMY    . CORONARY ANGIOPLASTY WITH STENT PLACEMENT    . CORONARY ARTERY BYPASS GRAFT    . HERNIA REPAIR    . TONSILLECTOMY     Social History   Social History  . Marital status: Married    Spouse name: N/A  . Number of children: N/A  . Years of education: N/A   Occupational History  . Not on file.   Social History Main Topics  . Smoking status: Never Smoker  . Smokeless tobacco: Never Used  . Alcohol use No  . Drug use: No  . Sexual activity: Not on file   Other Topics Concern  . Not on file    Social History Narrative  . No narrative on file     Review of Systems: General: negative for chills, fever, night sweats or weight changes.  Cardiovascular: + for chest pain, negative for dyspnea on exertion, edema, orthopnea, palpitations, paroxysmal nocturnal dyspnea or shortness of breath Dermatological: negative for rash Respiratory: negative for cough or wheezing Urologic: negative for hematuria Abdominal: negative for nausea, vomiting, diarrhea, bright red blood per rectum, melena, or hematemesis Neurologic: negative for visual changes, syncope, or dizziness All other systems reviewed and are otherwise negative except as noted above.   Physical Exam:  Blood pressure 134/78, pulse 80, height 5' 9"  (1.753 m), weight 213 lb 12.8 oz (97 kg).  General appearance: alert, cooperative and no distress Neck: no carotid bruit and no JVD Lungs: clear to auscultation bilaterally Heart: regular rate and rhythm and 2/6 SM at RUSB/LUSB Extremities: no LEE Pulses: 2+ and symmetric Skin: warm and dry Neurologic: Grossly normal  EKG NSR. No ischemia  ASSESSMENT AND PLAN:   1. Stable Angina/CAD: s/p CABG in 2009 followed by PCI to distal anastomotic site of one of the saphenous vein graft early after surgery when he presented with a non-ST elevation MI. His last heart catheterization was in 2012 and this demonstrated patency of all of his bypass grafts. His recent CP is more c/w stable angina. Predictable SSCP occurring with moderate exertion and relieved with rest. No resting CP. No dyspnea. He is currently CP free. EKG shows NSR w/o ischemia. VSS. We will add Imdur 30 mg daily. He denies use of ED meds. Continue metoprolol, ASA, amlodipine, Crestor and lisinopril. Plan for NST to assess for ischemia. We will have patient return in 1-2 weeks to reassess symptoms, response to LA NTG and review stress test results. Decision regarding need for definitive LHC will be made at that time.   2. AS:  mild by echo 09/2015. With stable angina but no dyspnea, syncope/ near syncope.  2/6 murmur noted on exam.   3. HTN: BP is stable.   4. HLD: on statin therapy with Crestor.   5. DM: on Metformin. Managed by PCP.   PLAN  F/u in 1 -2 weeks after stress test.   Lyda Jester PA-C 07/14/2016 3:30 PM

## 2016-07-14 NOTE — Patient Instructions (Addendum)
Medication Instructions:   START TAKING IMDUR 30 MG ONCE A DAY   START  USING NITROGLYCERIN  SPRAY AS NEEDED FOR CHEST PAIN    If you need a refill on your cardiac medications before your next appointment, please call your pharmacy.  Labwork: NONE ORDER TODAY    Testing/Procedures: .Your physician has requested that you have a lexiscan myoview. For further information please visit [email or web address]. Please follow instruction sheet, as given.      Follow-Up: WILL BE SCHEDULED AFTER RESULTS OF STRESS TEST    Any Other Special Instructions Will Be Listed Below (If Applicable).

## 2016-07-14 NOTE — Telephone Encounter (Signed)
Spoke with pt. He states has been having  chest pain in the middle of his chest score of "3 to 4" and mild SOB for a week. The pain does not radiates to left arm. Pt states started using a treadmill even when he started having the cp he continued for 10 minutes and the pain did not intensified. The pain stop soon after he got off the treadmill.  Pt states the pain is similar to as before he had the stent placement. Pt is driving and  he denies cp now. Pt has an appointment with Lyda Jester PA at 2:00 PM today. Pt is aware he verbalized understanding.

## 2016-07-14 NOTE — Telephone Encounter (Signed)
Pt c/o of Chest Pain: STAT if CP now or developed within 24 hours  1. Are you having CP right now? no  2. Are you experiencing any other symptoms (ex. SOB, nausea, vomiting, sweating)? Minor SOB  3. How long have you been experiencing CP? week  4. Is your CP continuous or coming and going? Only when he exercise and on the treadmill  5. Have you taken Nitroglycerin? no ?

## 2016-07-15 ENCOUNTER — Ambulatory Visit: Payer: Medicare Other | Admitting: Cardiology

## 2016-07-17 ENCOUNTER — Encounter (HOSPITAL_COMMUNITY): Payer: Self-pay | Admitting: Emergency Medicine

## 2016-07-17 ENCOUNTER — Inpatient Hospital Stay (HOSPITAL_COMMUNITY)
Admission: EM | Admit: 2016-07-17 | Discharge: 2016-07-19 | DRG: 247 | Disposition: A | Discharge status: Home / Self Care | Payer: Medicare Other | Attending: Cardiology | Admitting: Cardiology

## 2016-07-17 DIAGNOSIS — Z7982 Long term (current) use of aspirin: Secondary | ICD-10-CM

## 2016-07-17 DIAGNOSIS — I208 Other forms of angina pectoris: Secondary | ICD-10-CM | POA: Diagnosis present

## 2016-07-17 DIAGNOSIS — I70219 Atherosclerosis of native arteries of extremities with intermittent claudication, unspecified extremity: Secondary | ICD-10-CM | POA: Diagnosis present

## 2016-07-17 DIAGNOSIS — I252 Old myocardial infarction: Secondary | ICD-10-CM

## 2016-07-17 DIAGNOSIS — Z7984 Long term (current) use of oral hypoglycemic drugs: Secondary | ICD-10-CM

## 2016-07-17 DIAGNOSIS — I10 Essential (primary) hypertension: Secondary | ICD-10-CM | POA: Diagnosis present

## 2016-07-17 DIAGNOSIS — I25700 Atherosclerosis of coronary artery bypass graft(s), unspecified, with unstable angina pectoris: Secondary | ICD-10-CM | POA: Diagnosis not present

## 2016-07-17 DIAGNOSIS — Z955 Presence of coronary angioplasty implant and graft: Secondary | ICD-10-CM

## 2016-07-17 DIAGNOSIS — R079 Chest pain, unspecified: Secondary | ICD-10-CM | POA: Diagnosis not present

## 2016-07-17 DIAGNOSIS — E7800 Pure hypercholesterolemia, unspecified: Secondary | ICD-10-CM | POA: Diagnosis present

## 2016-07-17 DIAGNOSIS — Z23 Encounter for immunization: Secondary | ICD-10-CM | POA: Diagnosis not present

## 2016-07-17 DIAGNOSIS — I200 Unstable angina: Secondary | ICD-10-CM

## 2016-07-17 DIAGNOSIS — I350 Nonrheumatic aortic (valve) stenosis: Secondary | ICD-10-CM | POA: Diagnosis not present

## 2016-07-17 DIAGNOSIS — I2089 Other forms of angina pectoris: Secondary | ICD-10-CM | POA: Diagnosis present

## 2016-07-17 DIAGNOSIS — E1151 Type 2 diabetes mellitus with diabetic peripheral angiopathy without gangrene: Secondary | ICD-10-CM | POA: Diagnosis present

## 2016-07-17 DIAGNOSIS — I110 Hypertensive heart disease with heart failure: Secondary | ICD-10-CM | POA: Diagnosis not present

## 2016-07-17 HISTORY — DX: Type 2 diabetes mellitus without complications: E11.9

## 2016-07-17 LAB — GLUCOSE, CAPILLARY
GLUCOSE-CAPILLARY: 188 mg/dL — AB (ref 65–99)
Glucose-Capillary: 186 mg/dL — ABNORMAL HIGH (ref 65–99)

## 2016-07-17 LAB — HEPATIC FUNCTION PANEL
ALBUMIN: 3.8 g/dL (ref 3.5–5.0)
ALT: 30 U/L (ref 17–63)
AST: 35 U/L (ref 15–41)
Alkaline Phosphatase: 62 U/L (ref 38–126)
BILIRUBIN INDIRECT: 0.6 mg/dL (ref 0.3–0.9)
Bilirubin, Direct: 0.1 mg/dL (ref 0.1–0.5)
TOTAL PROTEIN: 7.1 g/dL (ref 6.5–8.1)
Total Bilirubin: 0.7 mg/dL (ref 0.3–1.2)

## 2016-07-17 LAB — CBC
HEMATOCRIT: 39.3 % (ref 39.0–52.0)
Hemoglobin: 12.6 g/dL — ABNORMAL LOW (ref 13.0–17.0)
MCH: 27.8 pg (ref 26.0–34.0)
MCHC: 32.1 g/dL (ref 30.0–36.0)
MCV: 86.8 fL (ref 78.0–100.0)
Platelets: 247 10*3/uL (ref 150–400)
RBC: 4.53 MIL/uL (ref 4.22–5.81)
RDW: 13.6 % (ref 11.5–15.5)
WBC: 11.7 10*3/uL — AB (ref 4.0–10.5)

## 2016-07-17 LAB — I-STAT TROPONIN, ED
Troponin i, poc: 0 ng/mL (ref 0.00–0.08)
Troponin i, poc: 0.02 ng/mL (ref 0.00–0.08)
Troponin i, poc: 0.03 ng/mL (ref 0.00–0.08)

## 2016-07-17 LAB — BASIC METABOLIC PANEL
Anion gap: 10 (ref 5–15)
BUN: 12 mg/dL (ref 6–20)
CALCIUM: 9.3 mg/dL (ref 8.9–10.3)
CO2: 24 mmol/L (ref 22–32)
CREATININE: 0.96 mg/dL (ref 0.61–1.24)
Chloride: 106 mmol/L (ref 101–111)
GFR calc Af Amer: >60 mL/min (ref >60)
GLUCOSE: 175 mg/dL — AB (ref 65–99)
Potassium: 4 mmol/L (ref 3.5–5.1)
Sodium: 140 mmol/L (ref 135–145)

## 2016-07-17 LAB — TROPONIN I
Troponin I: 0.04 ng/mL (ref <0.03)
Troponin I: 0.05 ng/mL (ref <0.03)

## 2016-07-17 LAB — PROTIME-INR
INR: 1.02
Prothrombin Time: 13.4 seconds (ref 11.4–15.2)

## 2016-07-17 LAB — HEPARIN LEVEL (UNFRACTIONATED): Heparin Unfractionated: <0.1 IU/mL — ABNORMAL LOW (ref 0.30–0.70)

## 2016-07-17 MED ORDER — HEPARIN (PORCINE) IN NACL 100-0.45 UNIT/ML-% IJ SOLN
1550.0000 [IU]/h | INTRAMUSCULAR | Status: DC
  Administered 2016-07-17: 1250 [IU]/h via INTRAVENOUS
  Administered 2016-07-18: 1550 [IU]/h via INTRAVENOUS
  Filled 2016-07-17 (×3): qty 250

## 2016-07-17 MED ORDER — NITROGLYCERIN 2 % TD OINT
2.0000 [in_us] | TOPICAL_OINTMENT | Freq: Four times a day (QID) | TRANSDERMAL | Status: DC
  Administered 2016-07-17 – 2016-07-18 (×4): 2 [in_us] via TOPICAL
  Filled 2016-07-17: qty 1
  Filled 2016-07-17: qty 30

## 2016-07-17 MED ORDER — INSULIN ASPART 100 UNIT/ML ~~LOC~~ SOLN
0.0000 [IU] | Freq: Three times a day (TID) | SUBCUTANEOUS | Status: DC
  Administered 2016-07-17: 2 [IU] via SUBCUTANEOUS
  Administered 2016-07-18: 1 [IU] via SUBCUTANEOUS
  Administered 2016-07-19: 07:00:00 2 [IU] via SUBCUTANEOUS

## 2016-07-17 MED ORDER — HEPARIN BOLUS VIA INFUSION
2700.0000 [IU] | Freq: Once | INTRAVENOUS | Status: AC
  Administered 2016-07-17: 2700 [IU] via INTRAVENOUS
  Filled 2016-07-17: qty 2700

## 2016-07-17 MED ORDER — ACETAMINOPHEN 325 MG PO TABS: 650.0000 mg | ORAL_TABLET | ORAL | Status: DC | PRN

## 2016-07-17 MED ORDER — BENAZEPRIL HCL 20 MG PO TABS
40.0000 mg | ORAL_TABLET | Freq: Every day | ORAL | Status: DC
  Administered 2016-07-18 – 2016-07-19 (×2): 40 mg via ORAL
  Filled 2016-07-17: qty 2
  Filled 2016-07-17: qty 1

## 2016-07-17 MED ORDER — SODIUM CHLORIDE 0.9 % IV SOLN: 250.0000 mL | INTRAVENOUS | Status: DC | PRN

## 2016-07-17 MED ORDER — ROSUVASTATIN CALCIUM 20 MG PO TABS
20.0000 mg | ORAL_TABLET | Freq: Every day | ORAL | Status: DC
  Administered 2016-07-17 – 2016-07-18 (×2): 20 mg via ORAL
  Filled 2016-07-17: qty 1
  Filled 2016-07-17: qty 2

## 2016-07-17 MED ORDER — SODIUM CHLORIDE 0.9 % WEIGHT BASED INFUSION
1.0000 mL/kg/h | INTRAVENOUS | Status: DC
  Administered 2016-07-17: 1 mL/kg/h via INTRAVENOUS

## 2016-07-17 MED ORDER — AMLODIPINE BESYLATE 5 MG PO TABS
5.0000 mg | ORAL_TABLET | Freq: Every day | ORAL | Status: DC
  Administered 2016-07-17 – 2016-07-19 (×3): 5 mg via ORAL
  Filled 2016-07-17 (×3): qty 1

## 2016-07-17 MED ORDER — NITROGLYCERIN 0.4 MG SL SUBL: 0.4000 mg | SUBLINGUAL_TABLET | SUBLINGUAL | Status: DC | PRN

## 2016-07-17 MED ORDER — INFLUENZA VAC SPLIT QUAD 0.5 ML IM SUSY
0.5000 mL | PREFILLED_SYRINGE | INTRAMUSCULAR | Status: AC
  Administered 2016-07-19: 0.5 mL via INTRAMUSCULAR
  Filled 2016-07-17: qty 0.5

## 2016-07-17 MED ORDER — SODIUM CHLORIDE 0.9% FLUSH: 3.0000 mL | INTRAVENOUS | Status: DC | PRN

## 2016-07-17 MED ORDER — SODIUM CHLORIDE 0.9% FLUSH
3.0000 mL | Freq: Two times a day (BID) | INTRAVENOUS | Status: DC
  Administered 2016-07-17: 3 mL via INTRAVENOUS

## 2016-07-17 MED ORDER — HYDROCHLOROTHIAZIDE 25 MG PO TABS
25.0000 mg | ORAL_TABLET | Freq: Every day | ORAL | Status: DC
  Administered 2016-07-17 – 2016-07-19 (×2): 25 mg via ORAL
  Filled 2016-07-17 (×2): qty 1

## 2016-07-17 MED ORDER — METOPROLOL TARTRATE 25 MG PO TABS
75.0000 mg | ORAL_TABLET | Freq: Two times a day (BID) | ORAL | Status: DC
  Administered 2016-07-17 – 2016-07-19 (×5): 75 mg via ORAL
  Filled 2016-07-17: qty 1
  Filled 2016-07-17: qty 3
  Filled 2016-07-17: qty 1
  Filled 2016-07-17: qty 3
  Filled 2016-07-17: qty 1

## 2016-07-17 MED ORDER — ASPIRIN 81 MG PO CHEW
81.0000 mg | CHEWABLE_TABLET | ORAL | Status: AC
  Administered 2016-07-18: 81 mg via ORAL
  Filled 2016-07-17: qty 1

## 2016-07-17 MED ORDER — HEPARIN BOLUS VIA INFUSION
4000.0000 [IU] | Freq: Once | INTRAVENOUS | Status: AC
  Administered 2016-07-17: 4000 [IU] via INTRAVENOUS
  Filled 2016-07-17: qty 4000

## 2016-07-17 MED ORDER — ONDANSETRON HCL 4 MG/2ML IJ SOLN: 4.0000 mg | Freq: Four times a day (QID) | INTRAMUSCULAR | Status: DC | PRN

## 2016-07-17 MED ORDER — ASPIRIN EC 81 MG PO TBEC
81.0000 mg | DELAYED_RELEASE_TABLET | Freq: Every day | ORAL | Status: DC
  Administered 2016-07-17 – 2016-07-19 (×2): 81 mg via ORAL
  Filled 2016-07-17 (×2): qty 1

## 2016-07-17 NOTE — Progress Notes (Signed)
ANTICOAGULATION CONSULT NOTE - Follow Up Consult  Pharmacy Consult for heparin Indication: chest pain/ACS  No Known Allergies  Patient Measurements: Height: 5' 9"  (175.3 cm) (pt reported) Weight: 214 lb (97.1 kg) IBW/kg (Calculated) : 70.7 Heparin Dosing Weight: 90 kg  Vital Signs: Temp: 98.1 F (36.7 C) (10/01 1500) Temp Source: Oral (10/01 1500) BP: 152/74 (10/01 1621) Pulse Rate: 93 (10/01 1500)  Labs:  Recent Labs  07/17/16 0506 07/17/16 1302 07/17/16 1640  HGB 12.6*  --   --   HCT 39.3  --   --   PLT 247  --   --   LABPROT 13.4  --   --   INR 1.02  --   --   HEPARINUNFRC  --   --  <0.10*  CREATININE 0.96  --   --   TROPONINI  --  0.04*  --     Estimated Creatinine Clearance: 80 mL/min (by C-G formula based on SCr of 0.96 mg/dL).   Medications:  Prescriptions Prior to Admission  Medication Sig Dispense Refill Last Dose  . amLODipine (NORVASC) 5 MG tablet Take 1 tablet (5 mg total) by mouth daily. 90 tablet 3 07/16/2016 at Unknown time  . aspirin EC 81 MG tablet Take 81 mg by mouth daily.   07/16/2016 at Unknown time  . benazepril (LOTENSIN) 40 MG tablet Take 40 mg by mouth daily.    07/16/2016 at Unknown time  . CINNAMON PO Take 2 capsules by mouth daily.   Past Week at Unknown time  . Cyanocobalamin (VITAMIN B-12 PO) Take 1 tablet by mouth daily.   07/16/2016 at Unknown time  . glimepiride (AMARYL) 1 MG tablet Take 1 mg by mouth daily.   07/16/2016 at Unknown time  . hydrochlorothiazide (HYDRODIURIL) 25 MG tablet Take 1 tablet (25 mg total) by mouth daily. 30 tablet 11 07/16/2016 at Unknown time  . isosorbide mononitrate (IMDUR) 30 MG 24 hr tablet Take 1 tablet (30 mg total) by mouth daily. 30 tablet 3 07/16/2016 at Unknown time  . metFORMIN (GLUCOPHAGE) 500 MG tablet Take 1,000 mg by mouth 2 (two) times daily.    07/16/2016 at Unknown time  . metoprolol (LOPRESSOR) 50 MG tablet Take 1.5 tablets (75 mg total) by mouth 2 (two) times daily. 90 tablet 3 07/16/2016 at 1730   . nitroGLYCERIN (NITROLINGUAL) 0.4 MG/SPRAY spray Place 1 spray under the tongue every 5 (five) minutes x 3 doses as needed for chest pain. 12 g 1 9 years  . nitroGLYCERIN (NITROSTAT) 0.4 MG SL tablet Place 0.4 mg under the tongue every 5 (five) minutes as needed for chest pain.   07/17/2016 at Unknown time  . rosuvastatin (CRESTOR) 20 MG tablet TAKE 1 TABLET (20 MG TOTAL) BY MOUTH DAILY. 30 tablet 11 07/16/2016 at Unknown time    Assessment: 70 yo M admitted 07/17/2016 with exertional angina and extensive history of CAD. Pharmacy consulted to dose heparin. Plan is for cath tomorrow 10/2. Initial heparin level undetectable, confirmed with RN that drug is correctly infusing.  Heparin level undetectable, Hgb 12.6, Plt wnl. No s/sx of bleeding.   Goal of Therapy:  Heparin level 0.3-0.7 units/ml Monitor platelets by anticoagulation protocol: Yes   Plan:  - Give Heparin bolus 2700 units x1 - Increase heparin to 1550 units/hr - Monitor 8 hour heparin level - Monitor daily heparin level, CBC and s/sx of bleeding  Dimitri Ped, PharmD. PGY-2 Infectious Diseases Pharmacy Resident Pager: [phone] 07/17/2016,6:13 PM

## 2016-07-17 NOTE — ED Provider Notes (Signed)
[facility] DEPT Provider Note   CSN: [id number] Arrival date & time: 07/17/16  0442     History   Chief Complaint Chief Complaint  Patient presents with  . Chest Pain    HPI OSIE AMPARO is a 70 y.o. male. He presents for evaluation of an episode of chest pain that is now resolved.  He works as a Corporate treasurer. He estimates that he walks several 100 yards at a time during the day over the course of his day at his job. He does a lot of driving as well. He started noticing a few weeks ago that he was getting chest pain described as a pressure whenever he was exerting himself but states it "quite a bit of exertion". It resolved also immediately with rest. This worse and he states it is taken minimal exertion over the last week. Tonight his phone rang. He got up and went to answer the phone and use the restroom and had pain almost immediately described as a pressure. He laid down and it lasted for about 10 minutes. He took a single nitroglycerin and it resolved.  He continues to be pain-free  Significant history of coronary artery bypass grafting 2009. Had a acute occlusion of the vein graft and underwent PTCA later in 2009 for non-ST elevation MI.Marland Kitchen  His last angiogram was 2012 and showed patency of his grafts and stent.  Had 09/2015 echocardiogram for investigation of a murmur and found to have mild aortic stenosis. Echo dated 05/30/2016 shows pH. EF 55-60. No regional wall motion abnormalities. Normal LV relaxation grade 1 DD mild AS  HPI  Past Medical History:  Diagnosis Date  . Arrhythmia   . Atherosclerosis of native arteries of the extremities with intermittent claudication   . Coronary atherosclerosis of artery bypass graft   . Essential hypertension, benign   . Heart murmur   . Pure hypercholesterolemia     Patient Active Problem List   Diagnosis Date Noted  . Exertional angina ([place]) 07/14/2016  . HYPERLIPIDEMIA-MIXED 09/13/2008  . HYPERTENSION, BENIGN 09/13/2008    . ATHEROSCLEROSIS W /INT CLAUDICATION 09/13/2008  . CAD, ARTERY BYPASS GRAFT 02/27/2008    Past Surgical History:  Procedure Laterality Date  . ADENOIDECTOMY    . CHOLECYSTECTOMY    . CORONARY ANGIOPLASTY WITH STENT PLACEMENT    . CORONARY ARTERY BYPASS GRAFT    . HERNIA REPAIR    . TONSILLECTOMY         Home Medications    Prior to Admission medications   Medication Sig Start Date End Date Taking? Authorizing Provider  amLODipine (NORVASC) 5 MG tablet Take 1 tablet (5 mg total) by mouth daily. 06/13/16 09/11/16  Eileen Stanford, PA-C  aspirin EC 81 MG tablet Take 81 mg by mouth daily.    Historical Provider, MD  benazepril (LOTENSIN) 40 MG tablet Take 40 mg by mouth daily. 1 tab daily 04/30/12   Historical Provider, MD  CINNAMON PO Take 2 capsules by mouth daily.    Historical Provider, MD  glimepiride (AMARYL) 1 MG tablet Take 1 mg by mouth daily. 07/11/16   Historical Provider, MD  hydrochlorothiazide (HYDRODIURIL) 25 MG tablet Take 1 tablet (25 mg total) by mouth daily. 06/15/16   Eileen Stanford, PA-C  isosorbide mononitrate (IMDUR) 30 MG 24 hr tablet Take 1 tablet (30 mg total) by mouth daily. 07/14/16 10/12/16  Brittainy Erie Noe, PA-C  metFORMIN (GLUCOPHAGE) 500 MG tablet Take 1,000 mg by mouth 2 (two) times daily. TAKE  TWO TABLET IN THE MORNING , AND two IN THE EVENING 04/23/13   Historical Provider, MD  metoprolol (LOPRESSOR) 50 MG tablet Take 1.5 tablets (75 mg total) by mouth 2 (two) times daily. 06/03/16   Sherren Mocha, MD  nitroGLYCERIN (NITROLINGUAL) 0.4 MG/SPRAY spray Place 1 spray under the tongue every 5 (five) minutes x 3 doses as needed for chest pain. 07/14/16   Brittainy M Simmons, PA-C  rosuvastatin (CRESTOR) 20 MG tablet TAKE 1 TABLET (20 MG TOTAL) BY MOUTH DAILY. 09/28/15   Sherren Mocha, MD    Family History Family History  Problem Relation Age of Onset  . Family history unknown: Yes    Social History Social History  Substance Use Topics  .  Smoking status: Never Smoker  . Smokeless tobacco: Never Used  . Alcohol use No     Allergies   Other   Review of Systems Review of Systems  Constitutional: Negative for appetite change, chills, diaphoresis, fatigue and fever.  HENT: Negative for mouth sores, sore throat and trouble swallowing.   Eyes: Negative for visual disturbance.  Respiratory: Negative for cough, chest tightness, shortness of breath and wheezing.   Cardiovascular: Positive for chest pain.  Gastrointestinal: Negative for abdominal distention, abdominal pain, diarrhea, nausea and vomiting.  Endocrine: Negative for polydipsia, polyphagia and polyuria.  Genitourinary: Negative for dysuria, frequency and hematuria.  Musculoskeletal: Negative for gait problem.  Skin: Negative for color change, pallor and rash.  Neurological: Negative for dizziness, syncope, light-headedness and headaches.  Hematological: Does not bruise/bleed easily.  Psychiatric/Behavioral: Negative for behavioral problems and confusion.     Physical Exam Updated Vital Signs BP 161/77   Pulse 79   Temp 97.9 F (36.6 C) (Oral)   Resp 13   Ht 5' 9"  (1.753 m)   Wt 213 lb (96.6 kg)   SpO2 98%   BMI 31.45 kg/m   Physical Exam  Constitutional: He is oriented to person, place, and time. He appears well-developed and well-nourished. No distress.  HENT:  Head: Normocephalic.  Eyes: Conjunctivae are normal. Pupils are equal, round, and reactive to light. No scleral icterus.  Neck: Normal range of motion. Neck supple. No thyromegaly present.  Cardiovascular: Normal rate and regular rhythm.  Exam reveals no gallop and no friction rub.   No murmur heard. 2/6 systolic murmur  Pulmonary/Chest: Effort normal and breath sounds normal. No respiratory distress. He has no wheezes. He has no rales.  Abdominal: Soft. Bowel sounds are normal. He exhibits no distension. There is no tenderness. There is no rebound.  Musculoskeletal: Normal range of motion.   Neurological: He is alert and oriented to person, place, and time.  Skin: Skin is warm and dry. No rash noted.  Psychiatric: He has a normal mood and affect. His behavior is normal.     ED Treatments / Results  Labs (all labs ordered are listed, but only abnormal results are displayed) Labs Reviewed  CBC - Abnormal; Notable for the following:       Result Value   WBC 11.7 (*)    Hemoglobin 12.6 (*)    All other components within normal limits  BASIC METABOLIC PANEL - Abnormal; Notable for the following:    Glucose, Bld 175 (*)    All other components within normal limits  PROTIME-INR  I-STAT TROPOININ, ED    EKG  EKG Interpretation None       Radiology No results found.  Procedures Procedures (including critical care time)  Medications Ordered in ED Medications -  No data to display   Initial Impression / Assessment and Plan / ED Course  I have reviewed the triage vital signs and the nursing notes.  Pertinent labs & imaging results that were available during my care of the patient were reviewed by me and considered in my medical decision making (see chart for details).  Clinical Course    Fairly classic history for progressive and now unstable angina. No EKG changes. Currently pain free after single nitroglycerin at home. First enzyme normal. Has remained symptom-free in the emergency room. I discussed the case with Dr.Qureshi Qaqas of [facility].  She is a patient of Dr. Sherren Mocha at [facility]. Patient will be seen by cardiology in the emergency room.  Final Clinical Impressions(s) / ED Diagnoses   Final diagnoses:  Unstable angina [facility])    New Prescriptions New Prescriptions   No medications on file     Tanna Furry, MD 07/17/16 [phone]

## 2016-07-17 NOTE — H&P (Signed)
Patient ID: [name] [name] MRN: [id number], DOB/AGE: 72-24-1947   Admit date: 07/17/2016   Primary Physician: Gerrit Heck, MD Primary Cardiologist: Dr. [name]  Pt. Profile:  [name] [name] is a 70 y.o. male with a history of  CAD s/p CABG in 2009 and PCI to SVG 2009, HTN, HLD, PAD, DM and mild AS who presented to [facility] ED for evaluation of chest pressure.   HPI: hx of CABG in 2009 followed by stenting at the distal anastomotic site of one of the saphenous vein graft early after surgery when he presented with NSTEMI. His last heart catheterization was in 2012 and this demonstrated patency of all of his bypass grafts.  He was seen by Dr. [name] in the office in 09/2015 for follow up and evaluation of a new murmur. A subsequent 2D ECHO showed mild AS.   Last echo 05/2016 showed LV ef of 55-60%, mild LVH, grade 1 DD, mild AS.   Seen in clinic 2 days ago 07/14/16 for exertional chest discomfort x 1 week. Symptoms occur when he's on the treadmill, walking a long distance or attempting to walk up a flight of stairs. He notes substernal chest tightness. No associated symptoms. EKG non ischemic. Imdur added to regimen. Plan to tet NST to assess for ischemia.  This morning around 1:30am he woke to go bathroom and had substernal chest pressure. Pain last for about 20 mins, he took SL nitro x 1 and eventually eased off. 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 Around 2:30 he woke to take phone call and again noted chest pressure with minimal walking and EMS was call. He was given ASA 345m and SL nitro 1 with resolution of symptoms. No dyspnea, nausea, vomiting, diaphoresis or radiation. Denies orthopnea, PND, syncope, LE edema,  or palpitations. Nothing makes worse. This pain is different from his CABG. At that time he had "sharp" pain.   In ED, he again had chest pressure while walking to bathroom. Resolves spontaneously. SCr and electrolytes normal. POC troponin negative. WBC 11.7.  EKG showed sinus rhythm without acute changes. Currently chest pain free.   Problem List  Past Medical History:  Diagnosis Date  . Arrhythmia   . Atherosclerosis of native arteries of the extremities with intermittent claudication   . Coronary atherosclerosis of artery bypass graft   . Diabetes mellitus ([place]   . Essential hypertension, benign   . Heart murmur   . Pure hypercholesterolemia     Past Surgical History:  Procedure Laterality Date  . ADENOIDECTOMY    . CHOLECYSTECTOMY    . CORONARY ANGIOPLASTY WITH STENT PLACEMENT    . CORONARY ARTERY BYPASS GRAFT    . HERNIA REPAIR    . TONSILLECTOMY       Allergies  No Known Allergies   Home Medications  Prior to Admission medications   Medication Sig Start Date End Date Taking? Authorizing Provider  amLODipine (NORVASC) 5 MG tablet Take 1 tablet (5 mg total) by mouth daily. 06/13/16 09/11/16 Yes KEileen Stanford PA-C  aspirin EC 81 MG tablet Take 81 mg by mouth daily.   Yes Historical Provider, MD  benazepril (LOTENSIN) 40 MG tablet Take 40 mg by mouth daily.  04/30/12  Yes Historical Provider, MD  CINNAMON PO Take 2 capsules by mouth daily.   Yes Historical Provider, MD  Cyanocobalamin (VITAMIN B-12 PO) Take 1 tablet by mouth daily.   Yes Historical Provider, MD  glimepiride (AMARYL) 1 MG tablet Take 1  mg by mouth daily. 07/11/16  Yes Historical Provider, MD  hydrochlorothiazide (HYDRODIURIL) 25 MG tablet Take 1 tablet (25 mg total) by mouth daily. 06/15/16  Yes Eileen Stanford, PA-C  isosorbide mononitrate (IMDUR) 30 MG 24 hr tablet Take 1 tablet (30 mg total) by mouth daily. 07/14/16 10/12/16 Yes Brittainy Erie Noe, PA-C  metFORMIN (GLUCOPHAGE) 500 MG tablet Take 1,000 mg by mouth 2 (two) times daily.  04/23/13  Yes Historical Provider, MD  metoprolol (LOPRESSOR) 50 MG tablet Take 1.5 tablets (75 mg total) by mouth 2 (two) times daily. 06/03/16  Yes Sherren Mocha, MD  nitroGLYCERIN (NITROLINGUAL) 0.4 MG/SPRAY spray Place 1  spray under the tongue every 5 (five) minutes x 3 doses as needed for chest pain. 07/14/16  Yes Brittainy Erie Noe, PA-C  nitroGLYCERIN (NITROSTAT) 0.4 MG SL tablet Place 0.4 mg under the tongue every 5 (five) minutes as needed for chest pain.   Yes Historical Provider, MD  rosuvastatin (CRESTOR) 20 MG tablet TAKE 1 TABLET (20 MG TOTAL) BY MOUTH DAILY. 09/28/15  Yes Sherren Mocha, MD    Family History  Family History  Problem Relation Age of Onset  . Family history unknown: Yes   Family Status  Relation Status  . Mother Deceased  . Father Deceased    Social History  Social History   Social History  . Marital status: Married    Spouse name: N/A  . Number of children: N/A  . Years of education: N/A   Occupational History  . Not on file.   Social History Main Topics  . Smoking status: Never Smoker  . Smokeless tobacco: Never Used  . Alcohol use No  . Drug use: No  . Sexual activity: Not on file   Other Topics Concern  . Not on file   Social History Narrative  . No narrative on file     All other systems reviewed and are otherwise negative except as noted above.  Physical Exam  Blood pressure 164/85, pulse 78, temperature 97.9 F (36.6 C), temperature source Oral, resp. rate 15, height 5' 9"  (1.753 m), weight 213 lb (96.6 kg), SpO2 99 %.  General: Pleasant, NAD Psych: Normal affect. Neuro: Alert and oriented X 3. Moves all extremities spontaneously. HEENT: Normal  Neck: Supple without bruits or JVD. Lungs:  Resp regular and unlabored, CTA. Heart: RRR no s3, s4, III/VI systolic murmurs that radiates to carotids bilaterally. Abdomen: Soft, non-tender, non-distended, BS + x 4.  Extremities: No clubbing, cyanosis or edema. DP/PT/Radials 2+ and equal bilaterally.  Labs  No results for input(s): CKTOTAL, CKMB, TROPONINI in the last 72 hours. Lab Results  Component Value Date   WBC 11.7 (H) 07/17/2016   HGB 12.6 (L) 07/17/2016   HCT 39.3 07/17/2016   MCV 86.8  07/17/2016   PLT 247 07/17/2016     Recent Labs Lab 07/17/16 0506  NA 140  K 4.0  CL 106  CO2 24  BUN 12  CREATININE 0.96  CALCIUM 9.3  GLUCOSE 175*   Lab Results  Component Value Date   CHOL 129 09/25/2015   HDL 32 (L) 09/25/2015   LDLCALC 70 09/25/2015   TRIG 137 09/25/2015   Echo 05/30/16 ------------------------------------------------------------------- LV EF: 55% -   60%  ------------------------------------------------------------------- Indications:      (R01.1).  ------------------------------------------------------------------- History:   PMH:  Acquired from the patient and from the patient&'s chart.  Murmur.  Coronary artery disease.  Aortic valve disease. Risk factors:  Hypertension. Dyslipidemia.  ------------------------------------------------------------------- Study  Conclusions  - Left ventricle: The cavity size was normal. Wall thickness was   increased in a pattern of mild LVH. Systolic function was normal.   The estimated ejection fraction was in the range of 55% to 60%.   Wall motion was normal; there were no regional wall motion   abnormalities. Doppler parameters are consistent with abnormal   left ventricular relaxation (grade 1 diastolic dysfunction). - Aortic valve: There was mild stenosis.  Aortic valve:   Moderately thickened, moderately calcified leaflets.  Doppler:   There was mild stenosis.      VTI ratio of LVOT to aortic valve: 0.5. Valve area (VTI): 1.73 cm^2. Indexed valve area (VTI): 0.78 cm^2/m^2. Peak velocity ratio of LVOT to aortic valve: 0.44. Valve area (Vmax): 1.51 cm^2. Indexed valve area (Vmax): 0.68 cm^2/m^2. Mean velocity ratio of LVOT to aortic valve: 0.46. Valve area (Vmean): 1.58 cm^2. Indexed valve area (Vmean): 0.71 cm^2/m^2.    Mean gradient (S): 11 mm Hg. Peak gradient (S): 21 mm Hg.   Cath 10/2010 PROCEDURAL FINDINGS: 1. Aortic pressure is 144/82 with a mean of 100. 2. Left main coronary artery:   The left mainstem is severely calcified     with a 90% distal left main stenosis.  The ostial LAD is totally     occluded and heavily calcified.  The ostial Circumflex has diffuse 90% stenosis leading into the AV groove circumflex which has minimal filling because of competitive graft flow. 1. Right coronary artery:  The right coronary artery is patent     throughout its course and has diffuse irregularity with 50% mid     stenosis and 50% distal stenosis.  The ostium of the PDA branch     also has 50% stenosis.  There are no critical stenoses throughout     the course of the right coronary artery but there is diffuse     disease throughout the entire vessel.  The distal RCA and its     branch vessels fill competitively from both native and graft flow. 2. Saphenous vein graft angiography.  The saphenous vein graft to     obtuse marginal branch is patent.  There is a patent small stent at     the distal anastomotic site.  There is significant mismatch between     the size of the vein graft and the native circulation, but the     graft flow is good and it is greatly improved from the previous     study.  There is mild-to-moderate diffuse restenosis throughout the     small stent at that site.  Both OM branches fill well with TIMI 3     flow. 3. Saphenous vein graft to diagonal is widely patent.  There is no     significant stenosis.  The graft also retrograde fills the LAD.     The proximal diagonal before the graft insertion site has 95%     stenosis.  The proximal LAD has 90% stenosis just beyond the origin     of the grafted diagonal branch. 4. Saphenous vein graft to distal right coronary artery is widely     patent.  There is no significant stenosis throughout the body of     that graft. 5. Left internal mammary artery to LAD is widely patent.  The LAD     fills competitively with much of the flow coming from the diagonal     but also some antegrade flow through the left internal  mammary.  ASSESSMENT: 1. Severe three-vessel coronary artery disease with severe distal left     main stenosis, total occlusion of the left anterior descending,     severe proximal left circumflex stenosis, and moderate diffuse     right coronary artery stenosis. 2. Status post coronary bypass surgery with continued patency of the     saphenous vein graft to distal right coronary artery, saphenous     vein graft to diagonal, and left internal mammary artery to left     anterior descending. 3. Patency of the saphenous vein graft to obtuse marginal with size     mismatch between the vein graft and the distal vessel but continued     patency of the stent at distal anastomotic site.  RECOMMENDATIONS:  Recommend continued medical therapy.   ASSESSMENT AND PLAN  1. Exertional angina with hx CAD - s/p CABG in 2009 followed by PCI to distal anastomotic site of one of the saphenous vein graft early after surgery when he presented with NSTEMI. - Recently noted exertional chest pressure (s/s different from CABG). This been getting worse, now occurs with minimal exertion. - EKG non ischemic. POC roponin negative. Will plan for cath monday. Continue nitro paste. Start IV heparin.  2. Mild AS - by recent echo 05/2016.  3.HTN - Elevated since he came here. Continue amlodipine, HCTZ, metoprolol, benazepril and nitro paste.   4. DM - Hold metformin. Start SSI. Last A1c was 11. Will repeat.   5. HLD - 09/25/2015: Cholesterol 129; HDL 32; LDL Cholesterol 70; Triglycerides 137; VLDL 27  - Continue statin. Will get LFT and lipid panel.   Signed, Leanor Kail, PA-C 07/17/2016, 7:40 AM Pager [phone]  History and all data above reviewed.  Patient examined.  I agree with the findings as above.  The patient present with chest pain.  This is exertional and happening with increasing intensity and with less activity.  Substernal pressure relieved with SLNTG.  The patient exam reveals COR:RRR  ,   Lungs: Clear  ,  Abd: Positive bowel sounds, no rebound no guarding, Ext No edema  .  All available labs, radiology testing, previous records reviewed. Agree with documented assessment and plan. Unstable angina:  Needs cardiac cath.  Discussed with the patient and his wife.  The patient understands that risks included but are not limited to stroke (1 in 1000), death (1 in 30), kidney failure [usually temporary] (1 in 500), bleeding (1 in 200), allergic reaction [possibly serious] (1 in 200).  The patient understands and agrees to proceed.   Jeneen Rinks Elverta Dimiceli  8:42 AM  07/17/2016

## 2016-07-17 NOTE — Progress Notes (Signed)
ANTICOAGULATION CONSULT NOTE - Initial Consult  Pharmacy Consult for Heparin Indication: chest pain/ACS  No Known Allergies  Patient Measurements: Height: 5' 9"  (175.3 cm) Weight: 213 lb (96.6 kg) IBW/kg (Calculated) : 70.7 Heparin Dosing Weight: 90.8 kg  Vital Signs: Temp: 97.9 F (36.6 C) (10/01 0500) Temp Source: Oral (10/01 0500) BP: 146/75 (10/01 0745) Pulse Rate: 77 (10/01 0745)  Labs:  Recent Labs  07/17/16 0506  HGB 12.6*  HCT 39.3  PLT 247  LABPROT 13.4  INR 1.02  CREATININE 0.96    Estimated Creatinine Clearance: 79.8 mL/min (by C-G formula based on SCr of 0.96 mg/dL).   Medical History: Past Medical History:  Diagnosis Date  . Arrhythmia    Premature beats  . Atherosclerosis of native arteries of the extremities with intermittent claudication   . Coronary atherosclerosis of artery bypass graft   . Diabetes mellitus ([place])   . Essential hypertension, benign   . Pure hypercholesterolemia     Medications:  Scheduled:  . nitroGLYCERIN  2 inch Topical Q6H   Infusions:    Assessment: 69 year old male with exertional angina and extensive history of CAD in ED to start IV heparin for ACS. Plan for cath tomorrow.  Hgb 12.6, Plts 247. INR 1.02. No bleeding noted. Aspirin only prior to admission. Therapy discussed with patient.   Goal of Therapy:  Heparin level 0.3-0.7 units/ml Monitor platelets by anticoagulation protocol: Yes   Plan:  Give 4000 units bolus x 1 Start heparin infusion at 1250 units/hr Check anti-Xa level in 8 hours and daily while on heparin Continue to monitor H&H and platelets  Sloan Leiter, PharmD, BCPS Clinical Pharmacist [phone] 07/17/2016,8:27 AM

## 2016-07-17 NOTE — ED Triage Notes (Signed)
Pt in from home via [facility] with c/o chest pressure, beginning when he woke up at 0200. Pt woke up after phonecall, started moving around and central cp began. Pt took 1 NTG tab, s/s resolved. Pt was given 1 NTG, 35m ASA by EMS. Hx of CABG in 09', stent placement in 09'. Denies current cp, n/v, sob or dizziness.

## 2016-07-17 NOTE — ED Notes (Signed)
Attempted to call report to [phone]

## 2016-07-17 NOTE — ED Notes (Signed)
Cardiology rounding at bedside.

## 2016-07-18 ENCOUNTER — Encounter (HOSPITAL_COMMUNITY)
Admission: EM | Disposition: A | Discharge status: Home / Self Care | Payer: Self-pay | Source: Home / Self Care | Attending: Cardiology

## 2016-07-18 DIAGNOSIS — Z7984 Long term (current) use of oral hypoglycemic drugs: Secondary | ICD-10-CM | POA: Diagnosis not present

## 2016-07-18 DIAGNOSIS — I200 Unstable angina: Secondary | ICD-10-CM | POA: Diagnosis not present

## 2016-07-18 DIAGNOSIS — I10 Essential (primary) hypertension: Secondary | ICD-10-CM

## 2016-07-18 DIAGNOSIS — E1151 Type 2 diabetes mellitus with diabetic peripheral angiopathy without gangrene: Secondary | ICD-10-CM | POA: Diagnosis present

## 2016-07-18 DIAGNOSIS — E119 Type 2 diabetes mellitus without complications: Secondary | ICD-10-CM

## 2016-07-18 DIAGNOSIS — I252 Old myocardial infarction: Secondary | ICD-10-CM | POA: Diagnosis not present

## 2016-07-18 DIAGNOSIS — I25710 Atherosclerosis of autologous vein coronary artery bypass graft(s) with unstable angina pectoris: Secondary | ICD-10-CM

## 2016-07-18 DIAGNOSIS — Z7982 Long term (current) use of aspirin: Secondary | ICD-10-CM | POA: Diagnosis not present

## 2016-07-18 DIAGNOSIS — I70219 Atherosclerosis of native arteries of extremities with intermittent claudication, unspecified extremity: Secondary | ICD-10-CM | POA: Diagnosis present

## 2016-07-18 DIAGNOSIS — Z23 Encounter for immunization: Secondary | ICD-10-CM | POA: Diagnosis not present

## 2016-07-18 DIAGNOSIS — I25700 Atherosclerosis of coronary artery bypass graft(s), unspecified, with unstable angina pectoris: Secondary | ICD-10-CM | POA: Diagnosis not present

## 2016-07-18 DIAGNOSIS — E7800 Pure hypercholesterolemia, unspecified: Secondary | ICD-10-CM | POA: Diagnosis present

## 2016-07-18 DIAGNOSIS — I350 Nonrheumatic aortic (valve) stenosis: Secondary | ICD-10-CM | POA: Diagnosis present

## 2016-07-18 HISTORY — PX: CARDIAC CATHETERIZATION: SHX172

## 2016-07-18 LAB — CBC
HEMATOCRIT: 38.8 % — AB (ref 39.0–52.0)
HEMOGLOBIN: 12.2 g/dL — AB (ref 13.0–17.0)
MCH: 27.6 pg (ref 26.0–34.0)
MCHC: 31.4 g/dL (ref 30.0–36.0)
MCV: 87.8 fL (ref 78.0–100.0)
Platelets: 251 10*3/uL (ref 150–400)
RBC: 4.42 MIL/uL (ref 4.22–5.81)
RDW: 13.6 % (ref 11.5–15.5)
WBC: 8.5 10*3/uL (ref 4.0–10.5)

## 2016-07-18 LAB — LIPID PANEL
CHOL/HDL RATIO: 4.4 ratio
CHOLESTEROL: 109 mg/dL (ref 0–200)
HDL: 25 mg/dL — ABNORMAL LOW (ref >40)
LDL CALC: 40 mg/dL (ref 0–99)
TRIGLYCERIDES: 220 mg/dL — AB (ref <150)
VLDL: 44 mg/dL — AB (ref 0–40)

## 2016-07-18 LAB — BASIC METABOLIC PANEL
ANION GAP: 11 (ref 5–15)
BUN: 11 mg/dL (ref 6–20)
CHLORIDE: 105 mmol/L (ref 101–111)
CO2: 21 mmol/L — AB (ref 22–32)
Calcium: 9.4 mg/dL (ref 8.9–10.3)
Creatinine, Ser: 0.91 mg/dL (ref 0.61–1.24)
GFR calc Af Amer: >60 mL/min (ref >60)
GFR calc non Af Amer: >60 mL/min (ref >60)
GLUCOSE: 196 mg/dL — AB (ref 65–99)
POTASSIUM: 3.5 mmol/L (ref 3.5–5.1)
Sodium: 137 mmol/L (ref 135–145)

## 2016-07-18 LAB — POCT ACTIVATED CLOTTING TIME
ACTIVATED CLOTTING TIME: 279 s
ACTIVATED CLOTTING TIME: 384 s

## 2016-07-18 LAB — GLUCOSE, CAPILLARY
GLUCOSE-CAPILLARY: 160 mg/dL — AB (ref 65–99)
GLUCOSE-CAPILLARY: 141 mg/dL — AB (ref 65–99)
GLUCOSE-CAPILLARY: 183 mg/dL — AB (ref 65–99)
Glucose-Capillary: 187 mg/dL — ABNORMAL HIGH (ref 65–99)

## 2016-07-18 LAB — TROPONIN I: TROPONIN I: 0.05 ng/mL — AB (ref <0.03)

## 2016-07-18 LAB — HEPARIN LEVEL (UNFRACTIONATED): Heparin Unfractionated: 0.34 IU/mL (ref 0.30–0.70)

## 2016-07-18 LAB — HEMOGLOBIN A1C
Hgb A1c MFr Bld: 10.5 % — ABNORMAL HIGH (ref 4.8–5.6)
Mean Plasma Glucose: 255 mg/dL

## 2016-07-18 SURGERY — LEFT HEART CATH AND CORS/GRAFTS ANGIOGRAPHY
Anesthesia: LOCAL

## 2016-07-18 MED ORDER — TICAGRELOR 90 MG PO TABS
90.0000 mg | ORAL_TABLET | Freq: Two times a day (BID) | ORAL | Status: DC
  Administered 2016-07-19 (×2): 90 mg via ORAL
  Filled 2016-07-18 (×2): qty 1

## 2016-07-18 MED ORDER — TICAGRELOR 90 MG PO TABS
ORAL_TABLET | ORAL | Status: AC
  Filled 2016-07-18: qty 2

## 2016-07-18 MED ORDER — IOPAMIDOL (ISOVUE-370) INJECTION 76%
INTRAVENOUS | Status: AC
  Filled 2016-07-18: qty 50

## 2016-07-18 MED ORDER — IOPAMIDOL (ISOVUE-370) INJECTION 76%
INTRAVENOUS | Status: AC
  Filled 2016-07-18: qty 100

## 2016-07-18 MED ORDER — LIDOCAINE HCL (PF) 1 % IJ SOLN
INTRAMUSCULAR | Status: DC | PRN
  Administered 2016-07-18: 2 mL

## 2016-07-18 MED ORDER — IOPAMIDOL (ISOVUE-370) INJECTION 76%
INTRAVENOUS | Status: DC | PRN
  Administered 2016-07-18: 130 mL via INTRA_ARTERIAL

## 2016-07-18 MED ORDER — SODIUM CHLORIDE 0.9% FLUSH: 3.0000 mL | INTRAVENOUS | Status: DC | PRN

## 2016-07-18 MED ORDER — LIDOCAINE HCL (PF) 1 % IJ SOLN
INTRAMUSCULAR | Status: AC
  Filled 2016-07-18: qty 30

## 2016-07-18 MED ORDER — ANGIOPLASTY BOOK
Freq: Once | Status: AC
  Administered 2016-07-18: 21:00:00
  Filled 2016-07-18: qty 1

## 2016-07-18 MED ORDER — HEPARIN (PORCINE) IN NACL 2-0.9 UNIT/ML-% IJ SOLN
INTRAMUSCULAR | Status: DC | PRN
  Administered 2016-07-18: 1500 mL

## 2016-07-18 MED ORDER — HEPARIN SODIUM (PORCINE) 1000 UNIT/ML IJ SOLN
INTRAMUSCULAR | Status: AC
  Filled 2016-07-18: qty 1

## 2016-07-18 MED ORDER — SODIUM CHLORIDE 0.9% FLUSH: 3.0000 mL | Freq: Two times a day (BID) | INTRAVENOUS | Status: DC

## 2016-07-18 MED ORDER — HEPARIN SODIUM (PORCINE) 1000 UNIT/ML IJ SOLN
INTRAMUSCULAR | Status: DC | PRN
  Administered 2016-07-18: 2000 [IU] via INTRAVENOUS
  Administered 2016-07-18 (×2): 5000 [IU] via INTRAVENOUS

## 2016-07-18 MED ORDER — IOPAMIDOL (ISOVUE-370) INJECTION 76%
INTRAVENOUS | Status: AC
  Filled 2016-07-18: qty 125

## 2016-07-18 MED ORDER — FENTANYL CITRATE (PF) 100 MCG/2ML IJ SOLN
INTRAMUSCULAR | Status: DC | PRN
  Administered 2016-07-18: 25 ug via INTRAVENOUS

## 2016-07-18 MED ORDER — VERAPAMIL HCL 2.5 MG/ML IV SOLN
INTRAVENOUS | Status: AC
  Filled 2016-07-18: qty 2

## 2016-07-18 MED ORDER — FENTANYL CITRATE (PF) 100 MCG/2ML IJ SOLN
INTRAMUSCULAR | Status: AC
  Filled 2016-07-18: qty 2

## 2016-07-18 MED ORDER — HEPARIN (PORCINE) IN NACL 2-0.9 UNIT/ML-% IJ SOLN
INTRAMUSCULAR | Status: DC | PRN
  Administered 2016-07-18: 10 mL via INTRA_ARTERIAL

## 2016-07-18 MED ORDER — SODIUM CHLORIDE 0.9 % IV SOLN: 250.0000 mL | INTRAVENOUS | Status: DC | PRN

## 2016-07-18 MED ORDER — HEPARIN (PORCINE) IN NACL 2-0.9 UNIT/ML-% IJ SOLN
INTRAMUSCULAR | Status: AC
  Filled 2016-07-18: qty 1500

## 2016-07-18 MED ORDER — SODIUM CHLORIDE 0.9 % IV SOLN
INTRAVENOUS | Status: AC
  Administered 2016-07-18: 18:00:00 via INTRAVENOUS

## 2016-07-18 MED ORDER — TICAGRELOR 90 MG PO TABS
ORAL_TABLET | ORAL | Status: DC | PRN
  Administered 2016-07-18: 180 mg via ORAL

## 2016-07-18 MED ORDER — MIDAZOLAM HCL 2 MG/2ML IJ SOLN
INTRAMUSCULAR | Status: DC | PRN
  Administered 2016-07-18: 2 mg via INTRAVENOUS

## 2016-07-18 MED ORDER — MIDAZOLAM HCL 2 MG/2ML IJ SOLN
INTRAMUSCULAR | Status: AC
  Filled 2016-07-18: qty 2

## 2016-07-18 SURGICAL SUPPLY — 16 items
BALLN EMERGE MR 2.5X12 (BALLOONS) ×2
BALLOON EMERGE MR 2.5X12 (BALLOONS) ×1 IMPLANT
CATH EXPO 5F MPA-1 (CATHETERS) ×2 IMPLANT
CATH INFINITI 5 FR IM (CATHETERS) ×2 IMPLANT
CATH INFINITI 5FR JL4 (CATHETERS) ×2 IMPLANT
DEVICE RAD COMP TR BAND LRG (VASCULAR PRODUCTS) ×2 IMPLANT
GLIDESHEATH SLEND SS 6F .021 (SHEATH) ×2 IMPLANT
GUIDE CATH RUNWAY 6FR AL 1 (CATHETERS) ×2 IMPLANT
KIT ENCORE 26 ADVANTAGE (KITS) ×2 IMPLANT
KIT HEART LEFT (KITS) ×2 IMPLANT
PACK CARDIAC CATHETERIZATION (CUSTOM PROCEDURE TRAY) ×2 IMPLANT
STENT PROMUS PREM MR 2.5X12 (Permanent Stent) ×2 IMPLANT
TRANSDUCER W/STOPCOCK (MISCELLANEOUS) ×2 IMPLANT
TUBING CIL FLEX 10 FLL-RA (TUBING) ×2 IMPLANT
WIRE COUGAR XT STRL 190CM (WIRE) ×2 IMPLANT
WIRE SAFE-T 1.5MM-J .035X260CM (WIRE) ×2 IMPLANT

## 2016-07-18 NOTE — Progress Notes (Signed)
Patient Name: [name] [name] Date of Encounter: 07/18/2016  Primary Cardiologist: Dr. Tyrell Antonio Problem List     Active Problems:   Stable angina [facility])     Subjective   Feels well, denies chest pain and SOB.   Inpatient Medications    Scheduled Meds: . amLODipine  5 mg Oral Daily  . aspirin EC  81 mg Oral Daily  . benazepril  40 mg Oral Daily  . hydrochlorothiazide  25 mg Oral Daily  . Influenza vac split quadrivalent PF  0.5 mL Intramuscular Tomorrow-1000  . insulin aspart  0-9 Units Subcutaneous TID WC  . metoprolol  75 mg Oral BID  . nitroGLYCERIN  2 inch Topical Q6H  . rosuvastatin  20 mg Oral q1800  . sodium chloride flush  3 mL Intravenous Q12H   Continuous Infusions: . sodium chloride 1 mL/kg/hr (07/17/16 2000)  . heparin 1,550 Units/hr (07/18/16 0420)   PRN Meds:.sodium chloride, acetaminophen, nitroGLYCERIN, ondansetron (ZOFRAN) IV, sodium chloride flush   Vital Signs    Vitals:   07/17/16 1500 07/17/16 1621 07/17/16 2100 07/18/16 0457  BP: (!) 170/82 (!) 152/74 (!) 156/98 (!) 146/78  Pulse: 93  72 71  Resp: 17 18 (!) 21 17  Temp: 98.1 F (36.7 C)  97.9 F (36.6 C) 97.8 F (36.6 C)  TempSrc: Oral     SpO2:   98% 99%  Weight: 214 lb (97.1 kg)   213 lb 9.6 oz (96.9 kg)  Height: 5' 9"  (1.753 m)       Intake/Output Summary (Last 24 hours) at 07/18/16 0931 Last data filed at 07/18/16 0719  Gross per 24 hour  Intake           505.02 ml  Output             1250 ml  Net          -744.98 ml   Filed Weights   07/17/16 0458 07/17/16 1500 07/18/16 0457  Weight: 213 lb (96.6 kg) 214 lb (97.1 kg) 213 lb 9.6 oz (96.9 kg)    Physical Exam   GEN: Well nourished, well developed, in no acute distress.  HEENT: Grossly normal.  Neck: Supple, no JVD, carotid bruits, or masses. Cardiac: RRR, 2/6 systolic murmur at left sternal border. No rubs, or gallops. No clubbing, cyanosis, edema.  Radials/DP/PT 2+ and equal bilaterally.  Respiratory:   Respirations regular and unlabored, clear to auscultation bilaterally. GI: Soft, nontender, nondistended, BS + x 4. MS: no deformity or atrophy. Skin: warm and dry, no rash. Neuro:  Strength and sensation are intact. Psych: AAOx3.  Normal affect.  Labs    CBC  Recent Labs  07/17/16 0506 07/18/16 0037  WBC 11.7* 8.5  HGB 12.6* 12.2*  HCT 39.3 38.8*  MCV 86.8 87.8  PLT 247 466   Basic Metabolic Panel  Recent Labs  07/17/16 0506 07/18/16 0037  NA 140 137  K 4.0 3.5  CL 106 105  CO2 24 21*  GLUCOSE 175* 196*  BUN 12 11  CREATININE 0.96 0.91  CALCIUM 9.3 9.4   Liver Function Tests  Recent Labs  07/17/16 1302  AST 35  ALT 30  ALKPHOS 62  BILITOT 0.7  PROT 7.1  ALBUMIN 3.8   Cardiac Enzymes  Recent Labs  07/17/16 1302 07/17/16 1917 07/18/16 0037  TROPONINI 0.04* 0.05* 0.05*   Fasting Lipid Panel  Recent Labs  07/18/16 0037  CHOL 109  HDL 25*  LDLCALC 40  TRIG 220*  CHOLHDL 4.4     Telemetry    NSR - Personally Reviewed  ECG     NSR with <55m ST depression in inferior leads- Personally Reviewed  Radiology    No results found.   Cardiac Studies   Last cath in Jan. 2012 PROCEDURAL FINDINGS: 1. Aortic pressure is 144/82 with a mean of 100. 2. Left main coronary artery:  The left mainstem is severely calcified     with a 90% distal left main stenosis.  The ostial LAD is totally     occluded and heavily calcified.  The ostial Circumflex has diffuse 90% stenosis leading into the AV groove circumflex which has minimal filling because of competitive graft flow. 1. Right coronary artery:  The right coronary artery is patent     throughout its course and has diffuse irregularity with 50% mid     stenosis and 50% distal stenosis.  The ostium of the PDA branch     also has 50% stenosis.  There are no critical stenoses throughout     the course of the right coronary artery but there is diffuse     disease throughout the entire vessel.  The  distal RCA and its     branch vessels fill competitively from both native and graft flow. 2. Saphenous vein graft angiography.  The saphenous vein graft to     obtuse marginal branch is patent.  There is a patent small stent at     the distal anastomotic site.  There is significant mismatch between     the size of the vein graft and the native circulation, but the     graft flow is good and it is greatly improved from the previous     study.  There is mild-to-moderate diffuse restenosis throughout the     small stent at that site.  Both OM branches fill well with TIMI 3     flow. 3. Saphenous vein graft to diagonal is widely patent.  There is no     significant stenosis.  The graft also retrograde fills the LAD.     The proximal diagonal before the graft insertion site has 95%     stenosis.  The proximal LAD has 90% stenosis just beyond the origin     of the grafted diagonal branch. 4. Saphenous vein graft to distal right coronary artery is widely     patent.  There is no significant stenosis throughout the body of     that graft. 5. Left internal mammary artery to LAD is widely patent.  The LAD     fills competitively with much of the flow coming from the diagonal     but also some antegrade flow through the left internal mammary.  ASSESSMENT: 1. Severe three-vessel coronary artery disease with severe distal left     main stenosis, total occlusion of the left anterior descending,     severe proximal left circumflex stenosis, and moderate diffuse     right coronary artery stenosis. 2. Status post coronary bypass surgery with continued patency of the     saphenous vein graft to distal right coronary artery, saphenous     vein graft to diagonal, and left internal mammary artery to left     anterior descending. 3. Patency of the saphenous vein graft to obtuse marginal with size     mismatch between the vein graft and the distal vessel but continued     patency of the stent  at distal  anastomotic site.  RECOMMENDATIONS:  Recommend continued medical therapy.   Transthoracic Echocardiography 05/30/16 Study Conclusions  - Left ventricle: The cavity size was normal. Wall thickness was   increased in a pattern of mild LVH. Systolic function was normal.   The estimated ejection fraction was in the range of 55% to 60%.   Wall motion was normal; there were no regional wall motion   abnormalities. Doppler parameters are consistent with abnormal   left ventricular relaxation (grade 1 diastolic dysfunction). - Aortic valve: There was mild stenosis.    Patient Profile  Mr. [name] [name] is a 70 year old male with a past medical history of CAD s/p CABG in 2009 and DES to SVG in 2009 with early graft disease. Also history of HTN, HLD, PAD, DM, and mild AS. Admitted on 07/17/16 with chest pain - troponin 0.04>>0.05.   Assessment & Plan    1. Exertional angina with history of CAD: Patient with history of CABG in 2009 and DES in 2009 involving the distal anastomotic site of the SVG to obtuse marginal.   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 He notes that his pain was much worse before his CABG, his angina time is more dull pressure with no associated symptoms.   For cath today. Last cath report above. Continue IV heparin. Was on ASA only prior to admission.  2. HTN: Elevated. Would increase amlodipine to 100m daily. Continue benazepril 450m metoprolol 759mID. Home isosorbide 65m45mn hold as patient has on Nitro paste.   3. DM: Hold metformin. Start SSI. Last A1c was 10.5. Will likely need insulin started.  4. HLD: LDL is below goal - 40. Continue statin.     Signed, ErinArbutus Leas  07/18/2016, 9:31 AM   Patient seen, examined. Available data reviewed. Agree with findings, assessment, and plan as outlined by ErinJettie Booze. The patient is well known to me from the  outpatient setting. On exam, he is alert and oriented in no distress. Lungs are clear. Heart is regular rate and rhythm with a grade 3/6 harsh mid-peaking systolic murmur at the right upper sternal border. No diastolic murmur. A2 is intact. Abdomen is soft and nontender. There is no peripheral edema. The patient presents with typical symptoms of crescendo angina and he has mild troponin elevation. He is experiencing chest discomfort with this typical angina at just a few steps of walking. Symptoms are improved since topical nitroglycerin has been applied. Agree that he needs cardiac catheterization and possible angioplasty/stenting. Risks, indications, and alternatives reviewed with the patient and his family here at the bedside today (wife and daughter). He has been off of Plavix for over 2 years now. With recurrent ACS and previous bypass grafting I think he should be maintained on long-term goal antiplatelet therapy moving forward.  MichSherren MochaD. 07/18/2016 12:38 PM

## 2016-07-18 NOTE — Progress Notes (Signed)
Inpatient Diabetes Program Recommendations  AACE/ADA: New Consensus Statement on Inpatient Glycemic Control (2015)  Target Ranges:  Prepandial:   less than 140 mg/dL      Peak postprandial:   less than 180 mg/dL (1-2 hours)      Critically ill patients:  140 - 180 mg/dL   Results for [name] [name], [name] [name] (MRN [id number]) as of 07/18/2016 12:39  Ref. Range 07/18/2016 07:18 07/18/2016 11:11  Glucose-Capillary Latest Ref Range: 65 - 99 mg/dL 187 (H) 160 (H)   Results for [name] [name], [name] [name] (MRN [id number]) as of 07/18/2016 12:39  Ref. Range 07/17/2016 13:02  Hemoglobin A1C Latest Ref Range: 4.8 - 5.6 % 10.5 (H)    Admit with: Angina  History: DM  Home DM Meds: Amaryl 1 mg daily       Metformin 1000 mg bid  Current Insulin Orders: Novolog Sensitive Correction Scale/ SSI (0-9 units) TID AC      Spoke with patient about his current A1c of 10.5%.  Explained what an A1c is and what it measures.  Reminded patient that his goal A1c is 7% or less per ADA standards to prevent both acute and long-term complications.  Explained to patient the extreme importance of good glucose control at home.  Encouraged patient to check his CBGs at least bid at home (fasting and another check within the day) and to record all CBGs in a logbook for his PCP to review.  Patient told me that he has been working closely with his PCP (Dr. Drema Dallas) to better manage his CBGs.  Just started taking Amaryl 1 mg daily about 2 weeks ago.  Has been keeping detailed glucose logs for the last several weeks and plans to see Dr. Drema Dallas again in the next few weeks to review his CBGs and discuss escalation of medications.  Is not opposed to starting insulin at home if needed, however, would like to wait to see Dr. Drema Dallas before starting insulin at home since he has been following with her so closely.  Per pt, he has given insulin injections to a friend before and watches his wife take insulin at home as well.  Patient asked me if there are any other  medications available to help with blood sugar control.  I reviewed with patient his two medications and how they work and also discussed with patient other available classes of medications.     --Will follow patient during hospitalization--  Wyn Quaker RN, MSN, CDE Diabetes Coordinator Inpatient Glycemic Control Team Team Pager: [phone] (8a-5p)

## 2016-07-18 NOTE — H&P (View-Only) (Signed)
Patient Name: [name] [name] Date of Encounter: 07/18/2016  Primary Cardiologist: Dr. Tyrell Antonio Problem List     Active Problems:   Stable angina [facility])     Subjective   Feels well, denies chest pain and SOB.   Inpatient Medications    Scheduled Meds: . amLODipine  5 mg Oral Daily  . aspirin EC  81 mg Oral Daily  . benazepril  40 mg Oral Daily  . hydrochlorothiazide  25 mg Oral Daily  . Influenza vac split quadrivalent PF  0.5 mL Intramuscular Tomorrow-1000  . insulin aspart  0-9 Units Subcutaneous TID WC  . metoprolol  75 mg Oral BID  . nitroGLYCERIN  2 inch Topical Q6H  . rosuvastatin  20 mg Oral q1800  . sodium chloride flush  3 mL Intravenous Q12H   Continuous Infusions: . sodium chloride 1 mL/kg/hr (07/17/16 2000)  . heparin 1,550 Units/hr (07/18/16 0420)   PRN Meds:.sodium chloride, acetaminophen, nitroGLYCERIN, ondansetron (ZOFRAN) IV, sodium chloride flush   Vital Signs    Vitals:   07/17/16 1500 07/17/16 1621 07/17/16 2100 07/18/16 0457  BP: (!) 170/82 (!) 152/74 (!) 156/98 (!) 146/78  Pulse: 93  72 71  Resp: 17 18 (!) 21 17  Temp: 98.1 F (36.7 C)  97.9 F (36.6 C) 97.8 F (36.6 C)  TempSrc: Oral     SpO2:   98% 99%  Weight: 214 lb (97.1 kg)   213 lb 9.6 oz (96.9 kg)  Height: 5' 9"  (1.753 m)       Intake/Output Summary (Last 24 hours) at 07/18/16 0931 Last data filed at 07/18/16 0719  Gross per 24 hour  Intake           505.02 ml  Output             1250 ml  Net          -744.98 ml   Filed Weights   07/17/16 0458 07/17/16 1500 07/18/16 0457  Weight: 213 lb (96.6 kg) 214 lb (97.1 kg) 213 lb 9.6 oz (96.9 kg)    Physical Exam   GEN: Well nourished, well developed, in no acute distress.  HEENT: Grossly normal.  Neck: Supple, no JVD, carotid bruits, or masses. Cardiac: RRR, 2/6 systolic murmur at left sternal border. No rubs, or gallops. No clubbing, cyanosis, edema.  Radials/DP/PT 2+ and equal bilaterally.  Respiratory:   Respirations regular and unlabored, clear to auscultation bilaterally. GI: Soft, nontender, nondistended, BS + x 4. MS: no deformity or atrophy. Skin: warm and dry, no rash. Neuro:  Strength and sensation are intact. Psych: AAOx3.  Normal affect.  Labs    CBC  Recent Labs  07/17/16 0506 07/18/16 0037  WBC 11.7* 8.5  HGB 12.6* 12.2*  HCT 39.3 38.8*  MCV 86.8 87.8  PLT 247 226   Basic Metabolic Panel  Recent Labs  07/17/16 0506 07/18/16 0037  NA 140 137  K 4.0 3.5  CL 106 105  CO2 24 21*  GLUCOSE 175* 196*  BUN 12 11  CREATININE 0.96 0.91  CALCIUM 9.3 9.4   Liver Function Tests  Recent Labs  07/17/16 1302  AST 35  ALT 30  ALKPHOS 62  BILITOT 0.7  PROT 7.1  ALBUMIN 3.8   Cardiac Enzymes  Recent Labs  07/17/16 1302 07/17/16 1917 07/18/16 0037  TROPONINI 0.04* 0.05* 0.05*   Fasting Lipid Panel  Recent Labs  07/18/16 0037  CHOL 109  HDL 25*  LDLCALC 40  TRIG 220*  CHOLHDL 4.4     Telemetry    NSR - Personally Reviewed  ECG     NSR with <76m ST depression in inferior leads- Personally Reviewed  Radiology    No results found.   Cardiac Studies   Last cath in Jan. 2012 PROCEDURAL FINDINGS: 1. Aortic pressure is 144/82 with a mean of 100. 2. Left main coronary artery:  The left mainstem is severely calcified     with a 90% distal left main stenosis.  The ostial LAD is totally     occluded and heavily calcified.  The ostial Circumflex has diffuse 90% stenosis leading into the AV groove circumflex which has minimal filling because of competitive graft flow. 1. Right coronary artery:  The right coronary artery is patent     throughout its course and has diffuse irregularity with 50% mid     stenosis and 50% distal stenosis.  The ostium of the PDA branch     also has 50% stenosis.  There are no critical stenoses throughout     the course of the right coronary artery but there is diffuse     disease throughout the entire vessel.  The  distal RCA and its     branch vessels fill competitively from both native and graft flow. 2. Saphenous vein graft angiography.  The saphenous vein graft to     obtuse marginal branch is patent.  There is a patent small stent at     the distal anastomotic site.  There is significant mismatch between     the size of the vein graft and the native circulation, but the     graft flow is good and it is greatly improved from the previous     study.  There is mild-to-moderate diffuse restenosis throughout the     small stent at that site.  Both OM branches fill well with TIMI 3     flow. 3. Saphenous vein graft to diagonal is widely patent.  There is no     significant stenosis.  The graft also retrograde fills the LAD.     The proximal diagonal before the graft insertion site has 95%     stenosis.  The proximal LAD has 90% stenosis just beyond the origin     of the grafted diagonal branch. 4. Saphenous vein graft to distal right coronary artery is widely     patent.  There is no significant stenosis throughout the body of     that graft. 5. Left internal mammary artery to LAD is widely patent.  The LAD     fills competitively with much of the flow coming from the diagonal     but also some antegrade flow through the left internal mammary.  ASSESSMENT: 1. Severe three-vessel coronary artery disease with severe distal left     main stenosis, total occlusion of the left anterior descending,     severe proximal left circumflex stenosis, and moderate diffuse     right coronary artery stenosis. 2. Status post coronary bypass surgery with continued patency of the     saphenous vein graft to distal right coronary artery, saphenous     vein graft to diagonal, and left internal mammary artery to left     anterior descending. 3. Patency of the saphenous vein graft to obtuse marginal with size     mismatch between the vein graft and the distal vessel but continued     patency of the stent  at distal  anastomotic site.  RECOMMENDATIONS:  Recommend continued medical therapy.   Transthoracic Echocardiography 05/30/16 Study Conclusions  - Left ventricle: The cavity size was normal. Wall thickness was   increased in a pattern of mild LVH. Systolic function was normal.   The estimated ejection fraction was in the range of 55% to 60%.   Wall motion was normal; there were no regional wall motion   abnormalities. Doppler parameters are consistent with abnormal   left ventricular relaxation (grade 1 diastolic dysfunction). - Aortic valve: There was mild stenosis.    Patient Profile  Mr. [place] is a 70 year old male with a past medical history of CAD s/p CABG in 2009 and DES to SVG in 2009 with early graft disease. Also history of HTN, HLD, PAD, DM, and mild AS. Admitted on 07/17/16 with chest pain - troponin 0.04>>0.05.   Assessment & Plan    1. Exertional angina with history of CAD: Patient with history of CABG in 2009 and DES in 2009 involving the distal anastomotic site of the SVG to obtuse marginal.   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 He notes that his pain was much worse before his CABG, his angina time is more dull pressure with no associated symptoms.   For cath today. Last cath report above. Continue IV heparin. Was on ASA only prior to admission.  2. HTN: Elevated. Would increase amlodipine to 61m daily. Continue benazepril 466m metoprolol 7528mID. Home isosorbide 54m93mn hold as patient has on Nitro paste.   3. DM: Hold metformin. Start SSI. Last A1c was 10.5. Will likely need insulin started.  4. HLD: LDL is below goal - 40. Continue statin.     Signed, ErinArbutus Leas  07/18/2016, 9:31 AM   Patient seen, examined. Available data reviewed. Agree with findings, assessment, and plan as outlined by ErinJettie Booze. The patient is well known to me from the  outpatient setting. On exam, he is alert and oriented in no distress. Lungs are clear. Heart is regular rate and rhythm with a grade 3/6 harsh mid-peaking systolic murmur at the right upper sternal border. No diastolic murmur. A2 is intact. Abdomen is soft and nontender. There is no peripheral edema. The patient presents with typical symptoms of crescendo angina and he has mild troponin elevation. He is experiencing chest discomfort with this typical angina at just a few steps of walking. Symptoms are improved since topical nitroglycerin has been applied. Agree that he needs cardiac catheterization and possible angioplasty/stenting. Risks, indications, and alternatives reviewed with the patient and his family here at the bedside today (wife and daughter). He has been off of Plavix for over 2 years now. With recurrent ACS and previous bypass grafting I think he should be maintained on long-term goal antiplatelet therapy moving forward.  MichSherren MochaD. 07/18/2016 12:38 PM

## 2016-07-18 NOTE — Care Management Obs Status (Signed)
MEDICARE OBSERVATION STATUS NOTIFICATION   Patient Details  Name: [name] [name] MRN: [id number] Date of Birth: 1946-02-13   Medicare Observation Status Notification Given:  Yes    Bethena Roys, RN 07/18/2016, 11:02 AM

## 2016-07-18 NOTE — Care Management Note (Signed)
Case Management Note  Patient Details  Name: [name] [name] MRN: [id number] Date of Birth: 31-Jul-1946  Subjective/Objective:    S/p coronary stent intervention, will be on brilinta, NCM awaiting benefit check,  NCM will cont to follow for dc needs.                Action/Plan:   Expected Discharge Date:                  Expected Discharge Plan:  Home/Self Care  In-House Referral:     Discharge planning Services  CM Consult  Post Acute Care Choice:    Choice offered to:     DME Arranged:    DME Agency:     HH Arranged:    HH Agency:     Status of Service:  In process, will continue to follow  If discussed at Long Length of Stay Meetings, dates discussed:    Additional Comments:  Zenon Mayo, RN 07/18/2016, 4:55 PM

## 2016-07-18 NOTE — Progress Notes (Signed)
ANTICOAGULATION CONSULT NOTE - Follow Up Consult  Pharmacy Consult for heparin Indication: chest pain/ACS  No Known Allergies  Patient Measurements: Height: 5' 9"  (175.3 cm) (pt reported) Weight: 213 lb 9.6 oz (96.9 kg) IBW/kg (Calculated) : 70.7 Heparin Dosing Weight: 90 kg  Vital Signs: Temp: 97.8 F (36.6 C) (10/02 0457) BP: 146/78 (10/02 0457) Pulse Rate: 71 (10/02 0457)  Labs:  Recent Labs  07/17/16 0506 07/17/16 1302 07/17/16 1640 07/17/16 1917 07/18/16 0037 07/18/16 0302  HGB 12.6*  --   --   --  12.2*  --   HCT 39.3  --   --   --  38.8*  --   PLT 247  --   --   --  251  --   LABPROT 13.4  --   --   --   --   --   INR 1.02  --   --   --   --   --   HEPARINUNFRC  --   --  <0.10*  --   --  0.34  CREATININE 0.96  --   --   --  0.91  --   TROPONINI  --  0.04*  --  0.05* 0.05*  --     Estimated Creatinine Clearance: 84.3 mL/min (by C-G formula based on SCr of 0.91 mg/dL).   Medications:  Prescriptions Prior to Admission  Medication Sig Dispense Refill Last Dose  . amLODipine (NORVASC) 5 MG tablet Take 1 tablet (5 mg total) by mouth daily. 90 tablet 3 07/16/2016 at Unknown time  . aspirin EC 81 MG tablet Take 81 mg by mouth daily.   07/16/2016 at Unknown time  . benazepril (LOTENSIN) 40 MG tablet Take 40 mg by mouth daily.    07/16/2016 at Unknown time  . CINNAMON PO Take 2 capsules by mouth daily.   Past Week at Unknown time  . Cyanocobalamin (VITAMIN B-12 PO) Take 1 tablet by mouth daily.   07/16/2016 at Unknown time  . glimepiride (AMARYL) 1 MG tablet Take 1 mg by mouth daily.   07/16/2016 at Unknown time  . hydrochlorothiazide (HYDRODIURIL) 25 MG tablet Take 1 tablet (25 mg total) by mouth daily. 30 tablet 11 07/16/2016 at Unknown time  . isosorbide mononitrate (IMDUR) 30 MG 24 hr tablet Take 1 tablet (30 mg total) by mouth daily. 30 tablet 3 07/16/2016 at Unknown time  . metFORMIN (GLUCOPHAGE) 500 MG tablet Take 1,000 mg by mouth 2 (two) times daily.    07/16/2016  at Unknown time  . metoprolol (LOPRESSOR) 50 MG tablet Take 1.5 tablets (75 mg total) by mouth 2 (two) times daily. 90 tablet 3 07/16/2016 at 1730  . nitroGLYCERIN (NITROLINGUAL) 0.4 MG/SPRAY spray Place 1 spray under the tongue every 5 (five) minutes x 3 doses as needed for chest pain. 12 g 1 9 years  . nitroGLYCERIN (NITROSTAT) 0.4 MG SL tablet Place 0.4 mg under the tongue every 5 (five) minutes as needed for chest pain.   07/17/2016 at Unknown time  . rosuvastatin (CRESTOR) 20 MG tablet TAKE 1 TABLET (20 MG TOTAL) BY MOUTH DAILY. 30 tablet 11 07/16/2016 at Unknown time    Assessment: 70 yo M admitted 07/17/2016 with exertional angina and extensive history of CAD. On IV heparin for ACS. Aspirin only PTA. Plan for cath on 10/2. Morning heparin level is therapeutic at 0.34. Hgb 12.2, plts wnl. No s/s of bleed.  Goal of Therapy:  Heparin level 0.3-0.7 units/ml Monitor platelets by anticoagulation protocol: Yes  Plan:  Continue heparin gtt at 1,550 units/hr Monitor daily HL, CBC, s/s of bleed F/U s/p cath today  Elenor Quinones, PharmD, [facility] Clinical Pharmacist Pager [phone] 07/18/2016 7:44 AM

## 2016-07-18 NOTE — Interval H&P Note (Signed)
History and Physical Interval Note:  07/18/2016 1:24 PM  [name] [name]  has presented today for cardiac cath with the diagnosis of unstable angina  The various methods of treatment have been discussed with the patient and family. After consideration of risks, benefits and other options for treatment, the patient has consented to  Procedure(s): Left Heart Cath and Cors/Grafts Angiography (N/A) as a surgical intervention .  The patient's history has been reviewed, patient examined, no change in status, stable for surgery.  I have reviewed the patient's chart and labs.  Questions were answered to the patient's satisfaction.    Cath Lab Visit (complete for each Cath Lab visit)  Clinical Evaluation Leading to the Procedure:   ACS: Yes.    Non-ACS:    Anginal Classification: CCS III  Anti-ischemic medical therapy: Maximal Therapy (2 or more classes of medications)  Non-Invasive Test Results: No non-invasive testing performed  Prior CABG: Previous CABG         Lauree Chandler

## 2016-07-19 ENCOUNTER — Encounter (HOSPITAL_COMMUNITY): Payer: Self-pay | Admitting: Cardiovascular Disease

## 2016-07-19 ENCOUNTER — Telehealth: Payer: Self-pay | Admitting: Medical

## 2016-07-19 LAB — BASIC METABOLIC PANEL
ANION GAP: 7 (ref 5–15)
BUN: 6 mg/dL (ref 6–20)
CHLORIDE: 106 mmol/L (ref 101–111)
CO2: 22 mmol/L (ref 22–32)
Calcium: 9.1 mg/dL (ref 8.9–10.3)
Creatinine, Ser: 0.84 mg/dL (ref 0.61–1.24)
GFR calc Af Amer: >60 mL/min (ref >60)
Glucose, Bld: 169 mg/dL — ABNORMAL HIGH (ref 65–99)
POTASSIUM: 3.7 mmol/L (ref 3.5–5.1)
SODIUM: 135 mmol/L (ref 135–145)

## 2016-07-19 LAB — CBC
HCT: 40.7 % (ref 39.0–52.0)
HEMOGLOBIN: 13 g/dL (ref 13.0–17.0)
MCH: 27.7 pg (ref 26.0–34.0)
MCHC: 31.9 g/dL (ref 30.0–36.0)
MCV: 86.6 fL (ref 78.0–100.0)
PLATELETS: 263 10*3/uL (ref 150–400)
RBC: 4.7 MIL/uL (ref 4.22–5.81)
RDW: 13.7 % (ref 11.5–15.5)
WBC: 10.1 10*3/uL (ref 4.0–10.5)

## 2016-07-19 LAB — GLUCOSE, CAPILLARY: GLUCOSE-CAPILLARY: 184 mg/dL — AB (ref 65–99)

## 2016-07-19 MED ORDER — TICAGRELOR 90 MG PO TABS: 90.0000 mg | ORAL_TABLET | Freq: Two times a day (BID) | ORAL | 12 refills | Status: DC

## 2016-07-19 MED ORDER — AMLODIPINE BESYLATE 10 MG PO TABS: 10.0000 mg | ORAL_TABLET | Freq: Every day | ORAL | Status: DC

## 2016-07-19 MED ORDER — AMLODIPINE BESYLATE 5 MG PO TABS
5.0000 mg | ORAL_TABLET | Freq: Once | ORAL | Status: AC
  Administered 2016-07-19: 10:00:00 5 mg via ORAL
  Filled 2016-07-19: qty 1

## 2016-07-19 MED ORDER — AMLODIPINE BESYLATE 10 MG PO TABS: 10.0000 mg | ORAL_TABLET | Freq: Every day | ORAL | 12 refills | Status: DC

## 2016-07-19 MED ORDER — TICAGRELOR 90 MG PO TABS: 90.0000 mg | ORAL_TABLET | Freq: Two times a day (BID) | ORAL | Status: DC

## 2016-07-19 MED ORDER — AMLODIPINE BESYLATE 5 MG PO TABS: 5.0000 mg | ORAL_TABLET | Freq: Once | ORAL | Status: AC

## 2016-07-19 NOTE — Discharge Summary (Signed)
Discharge Summary    Patient ID: [name] [name],  MRN: [id number], DOB/AGE: 1946-01-07 70 y.o.  Admit date: 07/17/2016 Discharge date: 07/19/2016  Primary Care Provider: Gerrit [name] Primary Cardiologist: Dr. [name]  Discharge Diagnoses    Active Problems:   Stable angina [facility])   Unstable angina (HCC)   Allergies No Known Allergies  Diagnostic Studies/Procedures   Coronary Stent Intervention  Left Heart Cath and Cors/Grafts Angiography 07/18/16   Prox RCA to Mid RCA lesion, 30 %stenosed.  Mid RCA to Dist RCA lesion, 60 %stenosed.  SVG graft was visualized by angiography and is normal in caliber and anatomically normal.  Dist RCA lesion, 40 %stenosed.  Ost Cx to Prox Cx lesion, 99 %stenosed.  SVG graft was visualized by angiography and is normal in caliber.  Dist Graft to Insertion lesion, 99 %stenosed.  Ost LAD to Prox LAD lesion, 100 %stenosed.  SVG graft was visualized by angiography and is normal in caliber and anatomically normal.  LIMA graft was visualized by angiography and is normal in caliber and anatomically normal.  Dist LAD lesion, 70 %stenosed.  LV end diastolic pressure is mildly elevated.   1. 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  2. The LAD is occluded proximally. The LIMA graft is patent to the mid LAD and fills the mid and distal vessel. The patent vein graft fills the diagonal branch.  3. The Circumflex is occluded proximally. The OM branch fills from a patent vein graft. The vein graft has severe restenosis in the stented segment at the anastomosis of the vein graft to the OM.  4. The RCA has moderate disease throughout. There is competitive flow distally from the patent vein graft to the distal RCA.  5. Successful PTCA/DES x 1 distal body of SVG to OM.   Recommendations: Continue DAPT with ASA and Brilinta (He will be enrolled in the TWILIGHT study protocol). Continue statin and beta blocker.    _____________   History of Present Illness   [name] is a 70 year old male with a past medical history of CAD s/p CABG in2009 and PCI to SVG 2009, HTN, HLD, PAD, DM and mild AS who presented to [facility] ED on 07/17/16 for evaluation of chest pressure.   He has a history of CABG in 2009 followed by stenting at the distal anastomotic site of SVG to OM a few months after his CABG when he presented as a NSTEMI.   He had a heart catheterization in 2012 that demonstrated patency of all his bypass grafts.  Last echo 05/2016 showed LV ef of 55-60%, mild LVH, grade 1 DD, mild AS. He presented to the office on 07/14/2016 with complaints of exertional chest discomfort for a week. Symptoms occurred when he was walking on the treadmill or walking a long distance is referred for a stress test, however he developed substernal chest pressure on the morning of 07/17/2016 that lasted about 20 minutes. He went back to sleep but was again awakened with chest pressure and he called EMS.  His EKG was nonischemic and his point-of-care troponin was negative, however with his history of coronary artery disease and presentation consistent with unstable angina he was referred for cardiac catheterization.   Hospital Course  Full heart catheterization report above. He underwent successful stenting with a drug-eluting stent distal body of the SVG to OM for treatment of severe in-stent restenosis. He will need to be on dual antiplatelet therapy indefinitely.  He will be on aspirin and Brilinta. Also he was hypertensive during admission. His amlodipine was increased to 10 mg daily. We will continue his benazepril 40 mg daily and metoprolol 75 mg twice a day.  He was instructed to hold his metformin post cath and resume tomorrow. His A1c is elevated at 10.5 he was encouraged to follow-up with his PCP to address poor glycemic control.  LDL was local 40. His high intensity statin was continued.  He was chest pain-free after  procedure. His left radial site was stable with no hematoma. _____________  Discharge Vitals Blood pressure (!) 164/89, pulse 79, temperature 98.5 F (36.9 C), temperature source Oral, resp. rate 16, height 5' 9"  (1.753 m), weight 211 lb 10.3 oz (96 kg), SpO2 99 %.  Filed Weights   07/17/16 1500 07/18/16 0457 07/19/16 0626  Weight: 214 lb (97.1 kg) 213 lb 9.6 oz (96.9 kg) 211 lb 10.3 oz (96 kg)    Labs & Radiologic Studies     CBC  Recent Labs  07/18/16 0037 07/19/16 0522  WBC 8.5 10.1  HGB 12.2* 13.0  HCT 38.8* 40.7  MCV 87.8 86.6  PLT 251 786   Basic Metabolic Panel  Recent Labs  07/18/16 0037 07/19/16 0522  NA 137 135  K 3.5 3.7  CL 105 106  CO2 21* 22  GLUCOSE 196* 169*  BUN 11 6  CREATININE 0.91 0.84  CALCIUM 9.4 9.1   Liver Function Tests  Recent Labs  07/17/16 1302  AST 35  ALT 30  ALKPHOS 62  BILITOT 0.7  PROT 7.1  ALBUMIN 3.8   Cardiac Enzymes  Recent Labs  07/17/16 1302 07/17/16 1917 07/18/16 0037  TROPONINI 0.04* 0.05* 0.05*   Hemoglobin A1C  Recent Labs  07/17/16 1302  HGBA1C 10.5*   Fasting Lipid Panel  Recent Labs  07/18/16 0037  CHOL 109  HDL 25*  LDLCALC 40  TRIG 220*  CHOLHDL 4.4     Disposition   Pt is being discharged home today in good condition.  Follow-up Plans & Appointments    Follow-up Information    Richardson Dopp, PA-C Follow up on 07/29/2016.   Specialties:  Cardiology, Physician Assistant Why:  at 8:45 am for hospital follow up Contact information: [address] [place] [address] [phone]          Discharge Instructions    Amb Referral to Cardiac Rehabilitation    Complete by:  As directed    Diagnosis:   Coronary Stents PTCA     Diet - low sodium heart healthy    Complete by:  As directed    Discharge instructions    Complete by:  As directed    Do not resume Metformin until tomorrow morning 07/20/16   Radial Site Care Refer to this sheet in the next  few weeks. These instructions provide you with information on caring for yourself after your procedure. Your caregiver may also give you more specific instructions. Your treatment has been planned according to current medical practices, but problems sometimes occur. Call your caregiver if you have any problems or questions after your procedure. HOME CARE INSTRUCTIONS You may shower the day after the procedure.Remove the bandage (dressing) and gently wash the site with plain soap and water.Gently pat the site dry.  Do not apply powder or lotion to the site.  Do not submerge the affected site in water for 3 to 5 days.  Inspect the site at least twice daily.  Do not flex or bend the affected arm for 24 hours.  No lifting over 5 pounds (2.3 kg) for 5 days after your procedure.  Do not drive home if you are discharged the same day of the procedure. Have someone else drive you.  You may drive 24 hours after the procedure unless otherwise instructed by your caregiver.  What to expect: Any bruising will usually fade within 1 to 2 weeks.  Blood that collects in the tissue (hematoma) may be painful to the touch. It should usually decrease in size and tenderness within 1 to 2 weeks.  SEEK IMMEDIATE MEDICAL CARE IF: You have unusual pain at the radial site.  You have redness, warmth, swelling, or pain at the radial site.  You have drainage (other than a small amount of blood on the dressing).  You have chills.  You have a fever or persistent symptoms for more than 72 hours.  You have a fever and your symptoms suddenly get worse.  Your arm becomes pale, cool, tingly, or numb.  You have heavy bleeding from the site. Hold pressure on the site.    NO HEAVY LIFTING OR SEXUAL ACTIVITY for 7 DAYS. NO DRIVING for 3-5 DAYS. NO SOAKING BATHS, HOT TUBS, POOLS, ETC., for 7 DAYS   Increase activity slowly    Complete by:  As directed       Discharge Medications   Current Discharge Medication List      START taking these medications   Details  ticagrelor (BRILINTA) 90 MG TABS tablet Take 1 tablet (90 mg total) by mouth 2 (two) times daily. Qty: 60 tablet, Refills: 12      CONTINUE these medications which have CHANGED   Details  amLODipine (NORVASC) 10 MG tablet Take 1 tablet (10 mg total) by mouth daily. Qty: 30 tablet, Refills: 12      CONTINUE these medications which have NOT CHANGED   Details  aspirin EC 81 MG tablet Take 81 mg by mouth daily.    benazepril (LOTENSIN) 40 MG tablet Take 40 mg by mouth daily.     CINNAMON PO Take 2 capsules by mouth daily.    Cyanocobalamin (VITAMIN B-12 PO) Take 1 tablet by mouth daily.    glimepiride (AMARYL) 1 MG tablet Take 1 mg by mouth daily.    hydrochlorothiazide (HYDRODIURIL) 25 MG tablet Take 1 tablet (25 mg total) by mouth daily. Qty: 30 tablet, Refills: 11    isosorbide mononitrate (IMDUR) 30 MG 24 hr tablet Take 1 tablet (30 mg total) by mouth daily. Qty: 30 tablet, Refills: 3    metFORMIN (GLUCOPHAGE) 500 MG tablet Take 1,000 mg by mouth 2 (two) times daily.     metoprolol (LOPRESSOR) 50 MG tablet Take 1.5 tablets (75 mg total) by mouth 2 (two) times daily. Qty: 90 tablet, Refills: 3    nitroGLYCERIN (NITROLINGUAL) 0.4 MG/SPRAY spray Place 1 spray under the tongue every 5 (five) minutes x 3 doses as needed for chest pain. Qty: 12 g, Refills: 1    nitroGLYCERIN (NITROSTAT) 0.4 MG SL tablet Place 0.4 mg under the tongue every 5 (five) minutes as needed for chest pain.    rosuvastatin (CRESTOR) 20 MG tablet TAKE 1 TABLET (20 MG TOTAL) BY MOUTH DAILY. Qty: 30 tablet, Refills: 11         Aspirin prescribed at discharge?  Yes High Intensity Statin Prescribed? (Lipitor 40-62m or Crestor 20-475m: Yes Beta Blocker Prescribed? Yes For EF 40% or less, Was ACEI/ARB Prescribed? No: EF  normal  ADP Receptor Inhibitor Prescribed? (i.e. Plavix etc.-Includes Medically Managed Patients): Yes For EF <40%, Aldosterone Inhibitor  Prescribed? No: EF normal  Was EF assessed during THIS hospitalization? No:  Was Cardiac Rehab II ordered? (Included Medically managed Patients): Yes   Outstanding Labs/Studies     Duration of Discharge Encounter   Greater than 30 minutes including physician time.  Signed, Arbutus Leas NP 07/19/2016, 12:18 PM

## 2016-07-19 NOTE — Telephone Encounter (Signed)
TCM Phone Call .Marland Kitchen Appt on 07/29/16 at 8:45am w/ Richardson Dopp

## 2016-07-19 NOTE — Progress Notes (Addendum)
Patient Name: [name] [name] Date of Encounter: 07/19/2016  Primary Cardiologist: Dr. Tyrell [name] Problem List     Active Problems:   Stable angina [facility])   Unstable angina ([place])     Subjective   Feels great. Denies chest pain and SOB.   Inpatient Medications    Scheduled Meds: . amLODipine  5 mg Oral Daily  . aspirin EC  81 mg Oral Daily  . benazepril  40 mg Oral Daily  . hydrochlorothiazide  25 mg Oral Daily  . Influenza vac split quadrivalent PF  0.5 mL Intramuscular Tomorrow-1000  . insulin aspart  0-9 Units Subcutaneous TID WC  . metoprolol  75 mg Oral BID  . rosuvastatin  20 mg Oral q1800  . sodium chloride flush  3 mL Intravenous Q12H  . ticagrelor  90 mg Oral BID   Continuous Infusions:  PRN Meds:.sodium chloride, acetaminophen, nitroGLYCERIN, ondansetron (ZOFRAN) IV, sodium chloride flush   Vital Signs    Vitals:   07/18/16 1941 07/18/16 2000 07/19/16 0626 07/19/16 0730  BP: (!) 158/70  (!) 183/75 (!) 146/62  Pulse: 71 67 77 79  Resp: 19 15 20 11   Temp: 98.1 F (36.7 C)  97.8 F (36.6 C) 98.5 F (36.9 C)  TempSrc: Oral  Oral Oral  SpO2: 95% 96% 99% 99%  Weight:   211 lb 10.3 oz (96 kg)   Height:        Intake/Output Summary (Last 24 hours) at 07/19/16 0826 Last data filed at 07/19/16 0731  Gross per 24 hour  Intake              300 ml  Output             1350 ml  Net            -1050 ml   Filed Weights   07/17/16 1500 07/18/16 0457 07/19/16 0626  Weight: 214 lb (97.1 kg) 213 lb 9.6 oz (96.9 kg) 211 lb 10.3 oz (96 kg)    Physical Exam   GEN: Well nourished, well developed, in no acute distress.  HEENT: Grossly normal.  Neck: Supple, no JVD, carotid bruits, or masses. Cardiac: RRR, 3/6 harsh late peaking murmur at right upper sternal border. No rubs, or gallops. No clubbing, cyanosis, edema.  Radials/DP/PT 2+ and equal bilaterally.  Respiratory:  Respirations regular and unlabored, clear to auscultation bilaterally. GI: Soft,  nontender, nondistended, BS + x 4. MS: no deformity or atrophy. Skin: warm and dry, no rash. Neuro:  Strength and sensation are intact. Psych: AAOx3.  Normal affect.  Labs    CBC  Recent Labs  07/18/16 0037 07/19/16 0522  WBC 8.5 10.1  HGB 12.2* 13.0  HCT 38.8* 40.7  MCV 87.8 86.6  PLT 251 616   Basic Metabolic Panel  Recent Labs  07/18/16 0037 07/19/16 0522  NA 137 135  K 3.5 3.7  CL 105 106  CO2 21* 22  GLUCOSE 196* 169*  BUN 11 6  CREATININE 0.91 0.84  CALCIUM 9.4 9.1   Liver Function Tests  Recent Labs  07/17/16 1302  AST 35  ALT 30  ALKPHOS 62  BILITOT 0.7  PROT 7.1  ALBUMIN 3.8   Cardiac Enzymes  Recent Labs  07/17/16 1302 07/17/16 1917 07/18/16 0037  TROPONINI 0.04* 0.05* 0.05*   Hemoglobin A1C  Recent Labs  07/17/16 1302  HGBA1C 10.5*   Fasting Lipid Panel  Recent Labs  07/18/16 0037  CHOL 109  HDL 25*  LDLCALC 40  TRIG 220*  CHOLHDL 4.4     Telemetry    NSR - Personally Reviewed  ECG     NSR- Personally Reviewed  Radiology    No results found.   Cardiac Studies   Coronary Stent Intervention  Left Heart Cath and Cors/Grafts Angiography     Prox RCA to Mid RCA lesion, 30 %stenosed.  Mid RCA to Dist RCA lesion, 60 %stenosed.  SVG graft was visualized by angiography and is normal in caliber and anatomically normal.  Dist RCA lesion, 40 %stenosed.  Ost Cx to Prox Cx lesion, 99 %stenosed.  SVG graft was visualized by angiography and is normal in caliber.  Dist Graft to Insertion lesion, 99 %stenosed.  Ost LAD to Prox LAD lesion, 100 %stenosed.  SVG graft was visualized by angiography and is normal in caliber and anatomically normal.  LIMA graft was visualized by angiography and is normal in caliber and anatomically normal.  Dist LAD lesion, 70 %stenosed.  LV end diastolic pressure is mildly elevated.   1. 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  2. The LAD is  occluded proximally. The LIMA graft is patent to the mid LAD and fills the mid and distal vessel. The patent vein graft fills the diagonal branch.  3. The Circumflex is occluded proximally. The OM branch fills from a patent vein graft. The vein graft has severe restenosis in the stented segment at the anastomosis of the vein graft to the OM.  4. The RCA has moderate disease throughout. There is competitive flow distally from the patent vein graft to the distal RCA.  5. Successful PTCA/DES x 1 distal body of SVG to OM.   Recommendations: Continue DAPT with ASA and Brilinta (He will be enrolled in the TWILIGHT study protocol). Continue statin and beta blocker.    Patient Profile     Mr. [name] [name] is a 70 year old with a past medical history of CAD s/p CABG in 2009 and DES to SVG in 2009 with early graft disease. Also history of HTN, HLD, PAD, DM, and mild AS. Admitted on 07/17/16 with chest pain - troponin 0.04>>0.05.   Assessment & Plan  1. Exertional angina with history of CAD: Patient with history of CABG in 2009 and DES in 2009 involving the distal anastomotic site of the SVG to obtuse marginal.   Had heart cath yesterday, report above. Underwent successful stenting of his distal body of SVG to OM. He will be on DAPT with ASA and Brilinta. He is enrolled in the TWILIGHT study.   2. HTN: Elevated. Would increase amlodipine to 49m daily. Continue benazepril 480m metoprolol 7542mID.   3. DM: Hold metformin. Start SSI. Last A1c was 10.5. Will likely need insulin started.  4. HLD: LDL is below goal - 40. Continue statin    Signed, [name] [name]  07/19/2016, 8:26 AM   Patient seen, examined. Available data reviewed. Agree with findings, assessment, and plan as outlined by [name] [name]. Exam reveals alert, oriented male in NAD. Lungs CTA, heart RRR with 2/6 systolic murmur at the RUSB, extremities without edema. Cath films reviewed. Pt with PCI of SVG-OM anastomosis with a DES for  treatment of severe in-stent restenosis. Plan as outlined above. He is a good candidate for the Twilight protocol and I would favor keeping him on long-term DAPT after the protocol is completed. Needs PCP follow-up to address poor glycemic control - we discussed lifestyle modification  today.  [name] [name], M.D. 07/19/2016 10:26 AM

## 2016-07-19 NOTE — Progress Notes (Signed)
CARDIAC REHAB PHASE I   PRE:  Rate/Rhythm: 81 SR w/ PVCs  BP:  Sitting: 186/85       SaO2: 99% RA  MODE:  Ambulation: 900 ft   POST:  Rate/Rhythm: 114 SR w/ PVCs   BP:  Sitting: 219/74        SaO2: 99% RA  Pt was already up ambulating when I entered his room.  I had pt sit to take his Bp, pt stated he had not had his bp meds this morning.  Pt walked 9104f and tolerated well.  No c/o of SOB or chest tightness/pain.  Returned to bed after walked to take bp.  BP was elevated and nurse administered meds after pt had walked.  Discussed the importance of taking Brilinta/ASA and NTG(as needed).  Discussed the importance if DM management, diet and ex and pt verbalized understanding.  Took BP 30 min after taking meds and bp was 164/89 (sitting).  Will refer to [place] 07/19/2016 9:45 AM

## 2016-07-19 NOTE — Progress Notes (Signed)
/  Viona Gilmore [facility]  @  [facility] # [phone]   BRILINTA  90 MG BID  ( 30 )   COVER- YES  CO-PAY- $27.00  TIER- 3 DRUG  PRIOR APPROVAL- NO  [facility]Y: [facility]   PLEASE LET THE PATIENT KNOW HE HAS RX COVERAGE THRU [facility]

## 2016-07-19 NOTE — Care Management Note (Signed)
Case Management Note  Patient Details  Name: DEWEY VIENS MRN: [id number] Date of Birth: 04-12-1946  Subjective/Objective:  S/p coronary stent intervention, on brilinta, co pay $27, [facility] one bottle left in stock.  Patient is for dc today, pta indep, no needs.                  Action/Plan:   Expected Discharge Date:                  Expected Discharge Plan:  Home/Self Care  In-House Referral:     Discharge planning Services  CM Consult  Post Acute Care Choice:    Choice offered to:     DME Arranged:    DME Agency:     HH Arranged:    HH Agency:     Status of Service:  Completed, signed off  If discussed at [employer] of Stay Meetings, dates discussed:    Additional Comments:  Zenon Mayo, RN 07/19/2016, 10:31 AM

## 2016-07-20 NOTE — Telephone Encounter (Signed)
Patient contacted regarding discharge from [facility] on 07/19/16.  Patient understands to follow up with provider Richardson Dopp PA on 07/29/16 at 8:45 AM at [address] office. Patient understands discharge instructions?  yes  Patient understands medications and regiment? yes   Patient understands to bring all medications to this visit? yes

## 2016-07-22 ENCOUNTER — Encounter (HOSPITAL_COMMUNITY): Payer: Medicare Other

## 2016-07-22 DIAGNOSIS — R972 Elevated prostate specific antigen [PSA]: Secondary | ICD-10-CM | POA: Diagnosis not present

## 2016-07-28 DIAGNOSIS — N400 Enlarged prostate without lower urinary tract symptoms: Secondary | ICD-10-CM | POA: Diagnosis not present

## 2016-07-28 DIAGNOSIS — R972 Elevated prostate specific antigen [PSA]: Secondary | ICD-10-CM | POA: Diagnosis not present

## 2016-07-29 ENCOUNTER — Ambulatory Visit (INDEPENDENT_AMBULATORY_CARE_PROVIDER_SITE_OTHER): Payer: Medicare Other | Admitting: Physician Assistant

## 2016-07-29 ENCOUNTER — Encounter (INDEPENDENT_AMBULATORY_CARE_PROVIDER_SITE_OTHER): Payer: Self-pay

## 2016-07-29 ENCOUNTER — Encounter: Payer: Self-pay | Admitting: Physician Assistant

## 2016-07-29 VITALS — BP 140/76 | HR 79 | Ht 69.0 in | Wt 212.8 lb

## 2016-07-29 DIAGNOSIS — E7800 Pure hypercholesterolemia, unspecified: Secondary | ICD-10-CM | POA: Diagnosis not present

## 2016-07-29 DIAGNOSIS — I350 Nonrheumatic aortic (valve) stenosis: Secondary | ICD-10-CM | POA: Insufficient documentation

## 2016-07-29 DIAGNOSIS — I2510 Atherosclerotic heart disease of native coronary artery without angina pectoris: Secondary | ICD-10-CM | POA: Diagnosis not present

## 2016-07-29 DIAGNOSIS — I10 Essential (primary) hypertension: Secondary | ICD-10-CM | POA: Diagnosis not present

## 2016-07-29 NOTE — Progress Notes (Signed)
Cardiology Office Note:    Date:  07/29/2016   ID:  [name] [name], DOB Oct 02, 1946, MRN [id number]  PCP:  Gerrit Heck, MD  Cardiologist:  Dr. Sherren Mocha   Electrophysiologist:  n/a  Referring MD: Leighton Ruff, MD   Chief Complaint  Patient presents with  . Hospitalization Follow-up    s/p PCI    History of Present Illness:    [name] [name] is a 70 y.o. male with a hx of CAD s/p CABG in 2009 and subsequent NSTEMI with PCI to distal anastomotic site of one of SVGs soon after surgery in 2009, aortic stenosis, HTN, HL, DM, PAD.  LHC in 2012 demonstrated patent bypass grafts. Echocardiogram in 8/17 demonstrated mild aortic stenosis with mean gradient 11 mmHg. He was seen in the office 07/06/16 with exertional angina. He was placed on long-acting nitrates with plans for early follow-up after stress testing. However, he was admitted 10/1-10/3 with worsening chest pain. Troponin levels were minimally elevated without trend. LHC demonstrated high-grade stenosis within the previously placed stent in the distal body of SVGs-OM 2. Adenoid PCI with DES. Other bypass grass were patent. Indefinite dual at the platelet therapy was recommended.  He returns for post hosp follow-up. Here alone. Doing well. He denies exertional chest pain or recurrent angina.  He notes occ vague chest discomfort and dyspnea.  He denies syncope.  He denies orthopnea, PND, edema.  He denies bleeding issues.   Prior CV studies that were reviewed today include:    LHC 07/18/16 LAD ostial 100, distal 70 LCx ostial 99 RCA proximal 30, mid 60, distal 40 SVG-RCA normal SVG-OM2 distal 99 ISR SVG-D1 normal LIMA-LAD normal PCI: 2.5 x 12 mm Promus Premier DES to distal SVG-OM2 1. 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  2. The LAD is occluded proximally. The LIMA graft is patent to the mid LAD and fills the mid and distal vessel. The patent vein graft fills the diagonal branch.  3.  The Circumflex is occluded proximally. The OM branch fills from a patent vein graft. The vein graft has severe restenosis in the stented segment at the anastomosis of the vein graft to the OM.  4. The RCA has moderate disease throughout. There is competitive flow distally from the patent vein graft to the distal RCA.  5. Successful PTCA/DES x 1 distal body of SVG to OM.   Echo 05/30/16 - Left ventricle: The cavity size was normal. Wall thickness was   increased in a pattern of mild LVH. Systolic function was normal.   The estimated ejection fraction was in the range of 55% to 60%.   Wall motion was normal; there were no regional wall motion   abnormalities. Doppler parameters are consistent with abnormal   left ventricular relaxation (grade 1 diastolic dysfunction). - Aortic valve: There was mild stenosis.  Mean 11 mmHg, peak 21 mmHg  Myoview 10/13 There is no sign of scar or ischemia. LV Ejection Fraction: 66%.  LV Wall Motion:  Normal.  Past Medical History:  Diagnosis Date  . Arrhythmia    Premature beats  . Atherosclerosis of native arteries of the extremities with intermittent claudication   . Coronary atherosclerosis of artery bypass graft   . Diabetes mellitus ([place])   . Essential hypertension, benign   . Pure hypercholesterolemia     Past Surgical History:  Procedure Laterality Date  . ADENOIDECTOMY    . CARDIAC CATHETERIZATION N/A 07/18/2016   Procedure: Left Heart  Cath and Cors/Grafts Angiography;  Surgeon: Burnell Blanks, MD;  Location: [facility];  Service: Cardiovascular;  Laterality: N/A;  . CARDIAC CATHETERIZATION N/A 07/18/2016   Procedure: Coronary Stent Intervention;  Surgeon: Burnell Blanks, MD;  Location: [facility];  Service: Cardiovascular;  Laterality: N/A;  . CHOLECYSTECTOMY    . CORONARY ANGIOPLASTY WITH STENT PLACEMENT    . CORONARY ARTERY BYPASS GRAFT    . INGUINAL HERNIA REPAIR Right   . TONSILLECTOMY      Current  Medications: Current Meds  Medication Sig  . amLODipine (NORVASC) 10 MG tablet Take 1 tablet (10 mg total) by mouth daily.  Marland Kitchen aspirin EC 81 MG tablet Take 81 mg by mouth daily.  . benazepril (LOTENSIN) 40 MG tablet Take 40 mg by mouth daily.   Marland Kitchen CINNAMON PO Take 2 capsules by mouth daily.  . Cyanocobalamin (VITAMIN B-12 PO) Take 1 tablet by mouth daily.  Marland Kitchen glimepiride (AMARYL) 1 MG tablet Take 1 mg by mouth daily.  . hydrochlorothiazide (HYDRODIURIL) 25 MG tablet Take 1 tablet (25 mg total) by mouth daily.  . isosorbide mononitrate (IMDUR) 30 MG 24 hr tablet Take 1 tablet (30 mg total) by mouth daily.  . metFORMIN (GLUCOPHAGE) 500 MG tablet Take 1,000 mg by mouth 2 (two) times daily.   . metoprolol (LOPRESSOR) 50 MG tablet Take 1.5 tablets (75 mg total) by mouth 2 (two) times daily.  . nitroGLYCERIN (NITROLINGUAL) 0.4 MG/SPRAY spray Place 1 spray under the tongue every 5 (five) minutes x 3 doses as needed for chest pain.  . nitroGLYCERIN (NITROSTAT) 0.4 MG SL tablet Place 0.4 mg under the tongue every 5 (five) minutes as needed for chest pain.  . rosuvastatin (CRESTOR) 20 MG tablet TAKE 1 TABLET (20 MG TOTAL) BY MOUTH DAILY.  . ticagrelor (BRILINTA) 90 MG TABS tablet Take 1 tablet (90 mg total) by mouth 2 (two) times daily.     Allergies:   Review of patient's allergies indicates no known allergies.   Social History   Social History  . Marital status: Married    Spouse name: N/A  . Number of children: N/A  . Years of education: N/A   Social History Main Topics  . Smoking status: Never Smoker  . Smokeless tobacco: Never Used  . Alcohol use No  . Drug use: No  . Sexual activity: Not Asked   Other Topics Concern  . None   Social History Narrative  . None     Family History:  The patient's family history includes Heart disease in his mother.   ROS:   Please see the history of present illness.    ROS All other systems reviewed and are negative.   EKGs/Labs/Other Test  Reviewed:    EKG:  EKG is  ordered today.  The ekg ordered today demonstrates NSR, HR 79, normal axis, QTC 440 ms, no changes prior tracing  Recent Labs: 07/17/2016: ALT 30 07/19/2016: BUN 6; Creatinine, Ser 0.84; Hemoglobin 13.0; Platelets 263; Potassium 3.7; Sodium 135   Recent Lipid Panel    Component Value Date/Time   CHOL 109 07/18/2016 0037   TRIG 220 (H) 07/18/2016 0037   HDL 25 (L) 07/18/2016 0037   CHOLHDL 4.4 07/18/2016 0037   VLDL 44 (H) 07/18/2016 0037   LDLCALC 40 07/18/2016 0037     Physical Exam:    VS:  BP 140/76   Pulse 79   Ht 5' 9"  (1.753 m)   Wt 212 lb 12.8  oz (96.5 kg)   BMI 31.43 kg/m     Wt Readings from Last 3 Encounters:  07/29/16 212 lb 12.8 oz (96.5 kg)  07/19/16 211 lb 10.3 oz (96 kg)  07/14/16 213 lb 12.8 oz (97 kg)     Physical Exam  Constitutional: He is oriented to person, place, and time. He appears well-developed and well-nourished. No distress.  HENT:  Head: Normocephalic and atraumatic.  Eyes: No scleral icterus.  Neck: Normal range of motion. No JVD present.  Cardiovascular: Normal rate, regular rhythm, S1 normal and S2 normal.   Murmur heard.  Harsh systolic murmur is present with a grade of 2/6  at the upper right sternal border Pulmonary/Chest: Effort normal and breath sounds normal. He has no wheezes. He has no rhonchi. He has no rales.  Abdominal: Soft. There is no tenderness.  Musculoskeletal: He exhibits no edema.  L wrist without hematoma  Neurological: He is alert and oriented to person, place, and time.  Skin: Skin is warm and dry.  Psychiatric: He has a normal mood and affect.    ASSESSMENT:    1. Coronary artery disease involving native coronary artery of native heart without angina pectoris   2. Aortic valve stenosis, etiology of cardiac valve disease unspecified   3. HYPERTENSION, BENIGN   4. Pure hypercholesterolemia    PLAN:    In order of problems listed above:  1. CAD - s/p CABG in 2009 and stenting  to distal S-OM in 2009 shortly after CABG in setting of NSTEMI.  Recent admit with [place] and high grade in-stent restenosis in the S-OM tx with DES.  Indef dual antiplatelet Rx recommended.  He denies recurrent angina. He does have some vague chest discomfort and shortness of breath that seems to be related to Brilinta. I have encouraged him that this will continue to get better over time. If side effects to Brilinta become intolerable, we can certainly change to Plavix. He is not interested in formal cardiac rehabilitation. He plans to walk on his own.  -  Continue aspirin, Brilinta, nitrates, beta blocker, statin.  2. Aortic stenosis - Mild by Echo in 8/17  3. HTN - His blood pressure is controlled.   4. HL - Continue statin. LDL in 10/17 was 40.     Medication Adjustments/Labs and Tests Ordered: Current medicines are reviewed at length with the patient today.  Concerns regarding medicines are outlined above.  Medication changes, Labs and Tests ordered today are outlined in the Patient Instructions noted below. Patient Instructions  Medication Instructions:  Your physician recommends that you continue on your current medications as directed. Please refer to the Current Medication list given to you today.   Labwork: None  Testing/Procedures: None  Follow-Up: Your physician recommends that you schedule a follow-up appointment in 3 months with Dr. [name].  Any Other Special Instructions Will Be Listed Below (If Applicable). If you need a refill on your cardiac medications before your next appointment, please call your pharmacy.  Signed, Richardson Dopp, PA-C  07/29/2016 9:13 AM    [facility] [address], [place], [place]  [address] Phone: [phone]; Fax: [phone]

## 2016-07-29 NOTE — Patient Instructions (Addendum)
Medication Instructions:  Your physician recommends that you continue on your current medications as directed. Please refer to the Current Medication list given to you today.   Labwork: None  Testing/Procedures: None  Follow-Up: Your physician recommends that you schedule a follow-up appointment in 3 months with Dr. [name].  Any Other Special Instructions Will Be Listed Below (If Applicable). If you need a refill on your cardiac medications before your next appointment, please call your pharmacy.

## 2016-08-10 DIAGNOSIS — D225 Melanocytic nevi of trunk: Secondary | ICD-10-CM | POA: Diagnosis not present

## 2016-08-10 DIAGNOSIS — L821 Other seborrheic keratosis: Secondary | ICD-10-CM | POA: Diagnosis not present

## 2016-08-28 ENCOUNTER — Other Ambulatory Visit: Payer: Self-pay | Admitting: Cardiovascular Disease

## 2016-11-15 ENCOUNTER — Other Ambulatory Visit: Payer: Self-pay | Admitting: Cardiology

## 2016-11-18 ENCOUNTER — Encounter: Payer: Self-pay | Admitting: Cardiovascular Disease

## 2016-11-18 ENCOUNTER — Ambulatory Visit (INDEPENDENT_AMBULATORY_CARE_PROVIDER_SITE_OTHER): Payer: Medicare Other | Admitting: Cardiovascular Disease

## 2016-11-18 ENCOUNTER — Encounter (INDEPENDENT_AMBULATORY_CARE_PROVIDER_SITE_OTHER): Payer: Self-pay

## 2016-11-18 VITALS — BP 148/66 | HR 74 | Ht 69.0 in | Wt 217.4 lb

## 2016-11-18 DIAGNOSIS — E785 Hyperlipidemia, unspecified: Secondary | ICD-10-CM

## 2016-11-18 DIAGNOSIS — I350 Nonrheumatic aortic (valve) stenosis: Secondary | ICD-10-CM | POA: Diagnosis not present

## 2016-11-18 DIAGNOSIS — I2510 Atherosclerotic heart disease of native coronary artery without angina pectoris: Secondary | ICD-10-CM | POA: Diagnosis not present

## 2016-11-18 NOTE — Patient Instructions (Signed)
Medication Instructions:  Your physician recommends that you continue on your current medications as directed. Please refer to the Current Medication list given to you today.  Labwork: No new orders.   Testing/Procedures: No new orders.   Follow-Up: Your physician wants you to follow-up in: 6 MONTHS with Dr [name].  You will receive a reminder letter in the mail two months in advance. If you don't receive a letter, please call our office to schedule the follow-up appointment.   Any Other Special Instructions Will Be Listed Below (If Applicable).     If you need a refill on your cardiac medications before your next appointment, please call your pharmacy.

## 2016-11-18 NOTE — Progress Notes (Signed)
Cardiology Office Note Date:  11/18/2016   ID:  [name], [name] 26-Mar-1946, MRN [id number]  PCP:  [name] Heck, MD  Cardiologist:  [name] Mocha, MD    Chief Complaint  Patient presents with  . Coronary Artery Disease   History of Present Illness: [name] [name] is a 71 y.o. male who presents for follow-up evaluation.   He has been followed regularly for coronary artery disease status post CABG in 2009. He underwent stenting at the distal anastomotic site of one of the saphenous vein graft early after surgery when he presented with a non-ST elevation MI. A heart catheterization in 2012 showed continued patency of his grafts and his stent site. He then developed recurrent dyspnea and angina in October 2017 and underwent repeat cath showing critical restenosis of an SVG anastomotic site requiring PCI with a drug-eluting stent.   The patient has done very well since then. He's developed no recurrent shortness of breath or chest discomfort. He is here alone today and has no complaints other than easy bruising. He denies orthopnea, PND, or leg swelling. He's been compliant with his medications. He is not engaged in any regular exercise.   Past Medical History:  Diagnosis Date  . Arrhythmia    Premature beats  . Atherosclerosis of native arteries of the extremities with intermittent claudication   . Coronary atherosclerosis of artery bypass graft   . Diabetes mellitus ([place])   . Essential hypertension, benign   . Pure hypercholesterolemia     Past Surgical History:  Procedure Laterality Date  . ADENOIDECTOMY    . CARDIAC CATHETERIZATION N/A 07/18/2016   Procedure: Left Heart Cath and Cors/Grafts Angiography;  Surgeon: Burnell Blanks, MD;  Location: [facility];  Service: Cardiovascular;  Laterality: N/A;  . CARDIAC CATHETERIZATION N/A 07/18/2016   Procedure: Coronary Stent Intervention;  Surgeon: Burnell Blanks, MD;  Location: [facility];   Service: Cardiovascular;  Laterality: N/A;  . CHOLECYSTECTOMY    . CORONARY ANGIOPLASTY WITH STENT PLACEMENT    . CORONARY ARTERY BYPASS GRAFT    . INGUINAL HERNIA REPAIR Right   . TONSILLECTOMY      Current Outpatient Prescriptions  Medication Sig Dispense Refill  . amLODipine (NORVASC) 10 MG tablet Take 1 tablet (10 mg total) by mouth daily. 30 tablet 12  . aspirin EC 81 MG tablet Take 81 mg by mouth daily.    . benazepril (LOTENSIN) 40 MG tablet Take 40 mg by mouth daily.     Marland Kitchen CINNAMON PO Take 2 capsules by mouth daily.    . Cyanocobalamin (VITAMIN B-12 PO) Take 1 tablet by mouth daily.    Marland Kitchen glimepiride (AMARYL) 1 MG tablet Take 1 mg by mouth daily.    . hydrochlorothiazide (HYDRODIURIL) 25 MG tablet Take 1 tablet (25 mg total) by mouth daily. 30 tablet 11  . isosorbide mononitrate (IMDUR) 30 MG 24 hr tablet TAKE 1 TABLET BY MOUTH DAILY 30 tablet 7  . metFORMIN (GLUCOPHAGE) 500 MG tablet Take 1,000 mg by mouth 2 (two) times daily.     . metoprolol (LOPRESSOR) 50 MG tablet Take 1.5 tablets (75 mg total) by mouth 2 (two) times daily. 90 tablet 3  . nitroGLYCERIN (NITROSTAT) 0.4 MG SL tablet Place 0.4 mg under the tongue every 5 (five) minutes as needed for chest pain.    . rosuvastatin (CRESTOR) 20 MG tablet TAKE 1 TABLET (20 MG TOTAL) BY MOUTH DAILY. 30 tablet 11  . ticagrelor (BRILINTA) 90  MG TABS tablet Take 1 tablet (90 mg total) by mouth 2 (two) times daily. 60 tablet 12   No current facility-administered medications for this visit.     Allergies:   Patient has no known allergies.   Social History:  The patient  reports that he has never smoked. He has never used smokeless tobacco. He reports that he does not drink alcohol or use drugs.   Family History:  The patient's  family history includes Heart disease in his mother.    ROS:  Please see the history of present illness.  Otherwise, review of systems is positive for easy bruising.  All other systems are reviewed and  negative.    PHYSICAL EXAM: VS:  BP (!) 148/66   Pulse 74   Ht 5' 9"  (1.753 m)   Wt 217 lb 6.4 oz (98.6 kg)   SpO2 97%   BMI 32.10 kg/m  , BMI Body mass index is 32.1 kg/m. GEN: Well nourished, well developed, in no acute distress  HEENT: normal  Neck: no JVD, no masses. No carotid bruits Cardiac: RRR with 2/6 systolic murmur at the RUSB              Respiratory:  clear to auscultation bilaterally, normal work of breathing GI: soft, nontender, nondistended, + BS MS: no deformity or atrophy  Ext: no pretibial edema, pedal pulses 2+= bilaterally Skin: warm and dry, no rash Neuro:  Strength and sensation are intact Psych: euthymic mood, full affect  EKG:  EKG is not ordered today.  Recent Labs: 07/17/2016: ALT 30 07/19/2016: BUN 6; Creatinine, Ser 0.84; Hemoglobin 13.0; Platelets 263; Potassium 3.7; Sodium 135   Lipid Panel     Component Value Date/Time   CHOL 109 07/18/2016 0037   TRIG 220 (H) 07/18/2016 0037   HDL 25 (L) 07/18/2016 0037   CHOLHDL 4.4 07/18/2016 0037   VLDL 44 (H) 07/18/2016 0037   LDLCALC 40 07/18/2016 0037      Wt Readings from Last 3 Encounters:  11/18/16 217 lb 6.4 oz (98.6 kg)  07/29/16 212 lb 12.8 oz (96.5 kg)  07/19/16 211 lb 10.3 oz (96 kg)     Cardiac Studies Reviewed: Cardiac Catheterization 07-18-2016: Conclusion     Prox RCA to Mid RCA lesion, 30 %stenosed.  Mid RCA to Dist RCA lesion, 60 %stenosed.  SVG graft was visualized by angiography and is normal in caliber and anatomically normal.  Dist RCA lesion, 40 %stenosed.  Ost Cx to Prox Cx lesion, 99 %stenosed.  SVG graft was visualized by angiography and is normal in caliber.  Dist Graft to Insertion lesion, 99 %stenosed.  Ost LAD to Prox LAD lesion, 100 %stenosed.  SVG graft was visualized by angiography and is normal in caliber and anatomically normal.  LIMA graft was visualized by angiography and is normal in caliber and anatomically normal.  Dist LAD lesion, 70  %stenosed.  LV end diastolic pressure is mildly elevated.   1. 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  2. The LAD is occluded proximally. The LIMA graft is patent to the mid LAD and fills the mid and distal vessel. The patent vein graft fills the diagonal branch.  3. The Circumflex is occluded proximally. The OM branch fills from a patent vein graft. The vein graft has severe restenosis in the stented segment at the anastomosis of the vein graft to the OM.  4. The RCA has moderate disease throughout. There is competitive flow distally  from the patent vein graft to the distal RCA.  5. Successful PTCA/DES x 1 distal body of SVG to OM.   Recommendations: Continue DAPT with ASA and Brilinta (He will be enrolled in the TWILIGHT study protocol). Continue statin and beta blocker.     ASSESSMENT AND PLAN: 1.  CAD, with angina: now resolved on his current medical therapy (imdur, amlodipine, metoprolol) and after undergoing PCI. Continue ASA, brilinta x 12 months and consider long-term DAPT as tolerated.   2. HTN, controlled: continue same Rx.  3. Hyperlipidemia: labs reviewed, LDL at goal < 70 mg/dL. On statin drug (crestor 20 mg).   4. 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 Lengthy discusson about specific strategies to lose weight. Strongly encouraged him to walk for exercise 6 days/week. Reviewed low-starch, low-carb diet. He wants to reduce medication and I suggested with weight loss he might be able to reduce BP and diabetes medicines.  Current medicines are reviewed with the patient today.  The patient does not have concerns regarding medicines.  Labs/ tests ordered today include:  No orders of the defined types were placed in this encounter.   Disposition:   FU 6 months  Signed, [name] Mocha, MD  11/18/2016 5:09 [facility] [place], [place], [place]  [address] Phone: [phone]; Fax: [phone]

## 2016-12-05 ENCOUNTER — Encounter (HOSPITAL_COMMUNITY): Payer: Self-pay | Admitting: Family Medicine

## 2016-12-05 NOTE — Progress Notes (Signed)
Mailed letter with [facility] to pt ... KJ

## 2016-12-10 ENCOUNTER — Other Ambulatory Visit: Payer: Self-pay | Admitting: Cardiovascular Disease

## 2016-12-12 ENCOUNTER — Telehealth: Payer: Self-pay | Admitting: Cardiovascular Disease

## 2016-12-12 MED ORDER — METOPROLOL TARTRATE 50 MG PO TABS: 75.0000 mg | ORAL_TABLET | Freq: Two times a day (BID) | ORAL | 11 refills | Status: DC

## 2016-12-12 NOTE — Telephone Encounter (Signed)
Rx sent to the pharmacy electronically through [facility].

## 2016-12-12 NOTE — Telephone Encounter (Signed)
New message    Kyla from [facility] is calling about refill request they received back. She states it said pt has refills on medication, but they do not have any.   *STAT* If patient is at the pharmacy, call can be transferred to refill team.   1. Which medications need to be refilled? (please list name of each medication and dose if known) metoprolol   2. Which pharmacy/location (including street and city if local pharmacy) is medication to be sent to? [facility]  3. Do they need a 30 day or 90 day supply? 30 day

## 2017-01-17 DIAGNOSIS — Z7982 Long term (current) use of aspirin: Secondary | ICD-10-CM | POA: Diagnosis not present

## 2017-01-17 DIAGNOSIS — Z974 Presence of external hearing-aid: Secondary | ICD-10-CM | POA: Diagnosis not present

## 2017-01-17 DIAGNOSIS — H906 Mixed conductive and sensorineural hearing loss, bilateral: Secondary | ICD-10-CM | POA: Diagnosis not present

## 2017-01-17 DIAGNOSIS — I10 Essential (primary) hypertension: Secondary | ICD-10-CM | POA: Diagnosis not present

## 2017-01-17 DIAGNOSIS — Z7902 Long term (current) use of antithrombotics/antiplatelets: Secondary | ICD-10-CM | POA: Diagnosis not present

## 2017-01-17 DIAGNOSIS — E1136 Type 2 diabetes mellitus with diabetic cataract: Secondary | ICD-10-CM | POA: Diagnosis not present

## 2017-01-17 DIAGNOSIS — Z79899 Other long term (current) drug therapy: Secondary | ICD-10-CM | POA: Diagnosis not present

## 2017-01-17 DIAGNOSIS — H6123 Impacted cerumen, bilateral: Secondary | ICD-10-CM | POA: Diagnosis not present

## 2017-01-17 DIAGNOSIS — Z7984 Long term (current) use of oral hypoglycemic drugs: Secondary | ICD-10-CM | POA: Diagnosis not present

## 2017-02-01 DIAGNOSIS — R972 Elevated prostate specific antigen [PSA]: Secondary | ICD-10-CM | POA: Diagnosis not present

## 2017-02-08 DIAGNOSIS — R972 Elevated prostate specific antigen [PSA]: Secondary | ICD-10-CM | POA: Diagnosis not present

## 2017-02-08 DIAGNOSIS — N400 Enlarged prostate without lower urinary tract symptoms: Secondary | ICD-10-CM | POA: Diagnosis not present

## 2017-02-10 DIAGNOSIS — E119 Type 2 diabetes mellitus without complications: Secondary | ICD-10-CM | POA: Diagnosis not present

## 2017-02-10 DIAGNOSIS — H2513 Age-related nuclear cataract, bilateral: Secondary | ICD-10-CM | POA: Diagnosis not present

## 2017-05-22 DIAGNOSIS — I10 Essential (primary) hypertension: Secondary | ICD-10-CM | POA: Diagnosis not present

## 2017-05-22 DIAGNOSIS — E1165 Type 2 diabetes mellitus with hyperglycemia: Secondary | ICD-10-CM | POA: Diagnosis not present

## 2017-05-22 DIAGNOSIS — E7800 Pure hypercholesterolemia, unspecified: Secondary | ICD-10-CM | POA: Diagnosis not present

## 2017-05-22 DIAGNOSIS — N400 Enlarged prostate without lower urinary tract symptoms: Secondary | ICD-10-CM | POA: Diagnosis not present

## 2017-05-22 DIAGNOSIS — R972 Elevated prostate specific antigen [PSA]: Secondary | ICD-10-CM | POA: Diagnosis not present

## 2017-05-22 DIAGNOSIS — Z1389 Encounter for screening for other disorder: Secondary | ICD-10-CM | POA: Diagnosis not present

## 2017-05-22 DIAGNOSIS — Z86010 Personal history of colonic polyps: Secondary | ICD-10-CM | POA: Diagnosis not present

## 2017-05-22 DIAGNOSIS — I350 Nonrheumatic aortic (valve) stenosis: Secondary | ICD-10-CM | POA: Diagnosis not present

## 2017-05-22 DIAGNOSIS — Z0000 Encounter for general adult medical examination without abnormal findings: Secondary | ICD-10-CM | POA: Diagnosis not present

## 2017-05-22 DIAGNOSIS — Z7984 Long term (current) use of oral hypoglycemic drugs: Secondary | ICD-10-CM | POA: Diagnosis not present

## 2017-05-23 ENCOUNTER — Encounter: Payer: Self-pay | Admitting: Physician Assistant

## 2017-05-23 NOTE — Progress Notes (Signed)
Cardiology Office Note:    Date:  05/24/2017   ID:  Malekai, Markwood 07/10/1946, MRN [id number]  PCP:  Leighton Ruff, MD  Cardiologist:  Dr. [name]  Referring MD: Leighton Ruff, MD   Chief Complaint  Patient presents with  . Follow-up    CAD    History of Present Illness:    [name] [name] is a 71 y.o. male with a past medical history significant for CAD status post CABG 2009 with stenting of SVG early after surgery in setting of NSTEMI, heart catheterization in 2012 showed continued patency of his grafts and stent. In 07/2016 he underwent cardiac catheterization for recurrent dyspnea and angina that showed critical restenosis of the SVG to circ requiring PCI with DES.  He was last seen by Dr. [name] and 11/2016 at which time he was doing well and participating in regular exercise. He is no longer formally exercising. He works [employer] and does a lot of walking and driving. He denies any chest pain or shortness of breath at rest or with exertion. He also denies orthopnea, PND, edema, lightheadedness, syncope/near syncope or changes in weight.   He was feeling muscle aches and fatigue after his stent that he attributed to the Brilinta, however, he started taking his medications at night and these symptoms improved.   Past Medical History:  Diagnosis Date  . Arrhythmia    Premature beats  . Atherosclerosis of native arteries of the extremities with intermittent claudication   . Coronary atherosclerosis of artery bypass graft    CABG 2009, LHC 07/2016 restenosis of SVG to OM treated with DES  . Diabetes mellitus ([place])   . Essential hypertension, benign   . Pure hypercholesterolemia     Past Surgical History:  Procedure Laterality Date  . ADENOIDECTOMY    . CARDIAC CATHETERIZATION N/A 07/18/2016   Procedure: Left Heart Cath and Cors/Grafts Angiography;  Surgeon: Burnell Blanks, MD;  Location: [place];  Service: Cardiovascular;   Laterality: N/A;  . CARDIAC CATHETERIZATION N/A 07/18/2016   Procedure: Coronary Stent Intervention;  Surgeon: Burnell Blanks, MD;  Location: [place] [facility];  Service: Cardiovascular;  Laterality: N/A;  . CHOLECYSTECTOMY    . CORONARY ANGIOPLASTY WITH STENT PLACEMENT    . CORONARY ARTERY BYPASS GRAFT    . INGUINAL HERNIA REPAIR Right   . TONSILLECTOMY      Current Medications: Current Meds  Medication Sig  . amLODipine (NORVASC) 10 MG tablet Take 1 tablet (10 mg total) by mouth daily.  Marland Kitchen aspirin EC 81 MG tablet Take 81 mg by mouth daily.  . benazepril (LOTENSIN) 40 MG tablet Take 40 mg by mouth daily.   Marland Kitchen CINNAMON PO Take 2 capsules by mouth daily.  . Cyanocobalamin (VITAMIN B-12 PO) Take 1 tablet by mouth daily.  Marland Kitchen glimepiride (AMARYL) 2 MG tablet Take 2 mg by mouth daily with breakfast.  . hydrochlorothiazide (HYDRODIURIL) 25 MG tablet Take 1 tablet (25 mg total) by mouth daily.  . isosorbide mononitrate (IMDUR) 30 MG 24 hr tablet TAKE 1 TABLET BY MOUTH DAILY  . metFORMIN (GLUCOPHAGE) 500 MG tablet Take 1,000 mg by mouth 2 (two) times daily.   . metoprolol (LOPRESSOR) 50 MG tablet Take 1.5 tablets (75 mg total) by mouth 2 (two) times daily.  . nitroGLYCERIN (NITROSTAT) 0.4 MG SL tablet Place 0.4 mg under the tongue every 5 (five) minutes as needed for chest pain.  . rosuvastatin (CRESTOR) 20 MG tablet TAKE 1 TABLET (  20 MG TOTAL) BY MOUTH DAILY.  . ticagrelor (BRILINTA) 90 MG TABS tablet Take 1 tablet (90 mg total) by mouth 2 (two) times daily.     Allergies:   Patient has no known allergies.   Social History   Social History  . Marital status: Married    Spouse name: N/A  . Number of children: N/A  . Years of education: N/A   Social History Main Topics  . Smoking status: Never Smoker  . Smokeless tobacco: Never Used  . Alcohol use No  . Drug use: No  . Sexual activity: Not Asked   Other Topics Concern  . None   Social History Narrative  . None       Family History: The patient's family history includes Heart disease in his mother. ROS:   Please see the history of present illness.     All other systems reviewed and are negative.  EKGs/Labs/Other Studies Reviewed:    The following studies were reviewed today:  Coronary Stent Intervention   07/18/2016  Left Heart Cath and Cors/Grafts Angiography  Conclusion    Prox RCA to Mid RCA lesion, 30 %stenosed.  Mid RCA to Dist RCA lesion, 60 %stenosed.  SVG graft was visualized by angiography and is normal in caliber and anatomically normal.  Dist RCA lesion, 40 %stenosed.  Ost Cx to Prox Cx lesion, 99 %stenosed.  SVG graft was visualized by angiography and is normal in caliber.  Dist Graft to Insertion lesion, 99 %stenosed.  Ost LAD to Prox LAD lesion, 100 %stenosed.  SVG graft was visualized by angiography and is normal in caliber and anatomically normal.  LIMA graft was visualized by angiography and is normal in caliber and anatomically normal.  Dist LAD lesion, 70 %stenosed.  LV end diastolic pressure is mildly elevated. 1. 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  2. The LAD is occluded proximally. The LIMA graft is patent to the mid LAD and fills the mid and distal vessel. The patent vein graft fills the diagonal branch.  3. The Circumflex is occluded proximally. The OM branch fills from a patent vein graft. The vein graft has severe restenosis in the stented segment at the anastomosis of the vein graft to the OM.  4. The RCA has moderate disease throughout. There is competitive flow distally from the patent vein graft to the distal RCA.  5. Successful PTCA/DES x 1 distal body of SVG to OM.   Recommendations: Continue DAPT with ASA and Brilinta (He will be enrolled in the TWILIGHT study protocol). Continue statin and beta blocker.     Echocardiogram 05/30/16 EF 55-60%, mild LVH, grade 1 diastolic dysfunction, mild aortic  stenosis   EKG:  EKG is ordered today.  The ekg ordered today demonstrates NSR at 71 bpm   Recent Labs: 07/17/2016: ALT 30 07/19/2016: BUN 6; Creatinine, Ser 0.84; Hemoglobin 13.0; Platelets 263; Potassium 3.7; Sodium 135   Recent Lipid Panel    Component Value Date/Time   CHOL 109 07/18/2016 0037   TRIG 220 (H) 07/18/2016 0037   HDL 25 (L) 07/18/2016 0037   CHOLHDL 4.4 07/18/2016 0037   VLDL 44 (H) 07/18/2016 0037   LDLCALC 40 07/18/2016 0037    Physical Exam:    VS:  BP (!) 144/80   Pulse 71   Ht 5' 9"  (1.753 m)   Wt 214 lb 9.6 oz (97.3 kg)   BMI 31.69 kg/m     Wt Readings from Last  3 Encounters:  05/24/17 214 lb 9.6 oz (97.3 kg)  11/18/16 217 lb 6.4 oz (98.6 kg)  07/29/16 212 lb 12.8 oz (96.5 kg)     Physical Exam  Constitutional: He is oriented to person, place, and time. He appears well-developed and well-nourished. No distress.  HENT:  Head: Normocephalic and atraumatic.  Neck: Normal range of motion. Neck supple. No JVD present.  Cardiovascular: Normal rate and regular rhythm.  Exam reveals no gallop and no friction rub.   Murmur heard.  Harsh midsystolic murmur is present with a grade of 2/6  at the upper right sternal border radiating to the neck Pulmonary/Chest: Effort normal and breath sounds normal. No respiratory distress. He has no wheezes. He has no rales. He exhibits no tenderness.  Abdominal: Soft. Bowel sounds are normal. He exhibits no distension. There is no tenderness.  Musculoskeletal: Normal range of motion. He exhibits no edema or deformity.  Neurological: He is alert and oriented to person, place, and time.  Skin: Skin is warm and dry.  Psychiatric: He has a normal mood and affect. His behavior is normal.  Vitals reviewed.    ASSESSMENT:    1. Atherosclerosis of coronary artery bypass graft of native heart without angina pectoris   2. HYPERTENSION, BENIGN   3. Pure hypercholesterolemia   4. Type 2 diabetes mellitus without complication,  without long-term current use of insulin (HCC)    PLAN:    In order of problems listed above:  1. CAD -S/P CABG in 2009 and OM graft stenosis with DES on 2 occasions, most recent in 07/2016. -No angina since stent.  -Treatment with aspirin, Brilinta X 12 months and consider long term DAPT as tolerated.  -Pt was having some muscle aches and fatigue which he was attributing to the Brilinta, but he started taking most of his medications at night and these symptoms improved.  2. Hypertension  -Blood pressure is mildy elevated here but was reportedly well controlled at PCP visit yesterday.  -Continue current amlodipine, benazepril, HCTZ and BB  3. Hyperlipidemia  -LDL goal <70 -Lipid panel in 07/2016 showed LDL of 40 -Continue current rosuvastatin 20 mg daily.  4. Type 2 diabetes -Last A1c in EPIC was 10.5 in 07/2016. Pt reports that A1c has improved per recent blood work, although he does not know the level. His Amaryl was increased.  -Management per Dr. Drema Dallas. -Pt advised to initiate some regular exercise like walking and continue to work on his diet.   Medication Adjustments/Labs and Tests Ordered: Current medicines are reviewed at length with the patient today.  Concerns regarding medicines are outlined above. Labs and tests ordered and medication changes are outlined in the patient instructions below:  Patient Instructions  Medication Instructions:  Your physician recommends that you continue on your current medications as directed. Please refer to the Current Medication list given to you today.   Labwork: NONE ORDERED   Testing/Procedures: NONE ORDERED   Follow-Up: Your physician wants you to follow-up in: 6 MONTHS WITH DR. Emelda Fear will receive a reminder letter in the mail two months in advance. If you don't receive a letter, please call our office to schedule the follow-up appointment.   Any Other Special Instructions Will Be Listed Below (If Applicable).     If  you need a refill on your cardiac medications before your next appointment, please call your pharmacy.      Signed, Daune Perch, NP  05/24/2017 12:48 PM    [facility]

## 2017-05-24 ENCOUNTER — Encounter: Payer: Self-pay | Admitting: Physician Assistant

## 2017-05-24 ENCOUNTER — Ambulatory Visit (INDEPENDENT_AMBULATORY_CARE_PROVIDER_SITE_OTHER): Payer: Medicare Other | Admitting: Cardiology

## 2017-05-24 VITALS — BP 144/80 | HR 71 | Ht 69.0 in | Wt 214.6 lb

## 2017-05-24 DIAGNOSIS — I10 Essential (primary) hypertension: Secondary | ICD-10-CM

## 2017-05-24 DIAGNOSIS — E7800 Pure hypercholesterolemia, unspecified: Secondary | ICD-10-CM

## 2017-05-24 DIAGNOSIS — I25810 Atherosclerosis of coronary artery bypass graft(s) without angina pectoris: Secondary | ICD-10-CM

## 2017-05-24 DIAGNOSIS — E119 Type 2 diabetes mellitus without complications: Secondary | ICD-10-CM | POA: Diagnosis not present

## 2017-05-24 NOTE — Patient Instructions (Signed)
Medication Instructions:  Your physician recommends that you continue on your current medications as directed. Please refer to the Current Medication list given to you today.   Labwork: NONE ORDERED   Testing/Procedures: NONE ORDERED   Follow-Up: Your physician wants you to follow-up in: 6 MONTHS WITH DR. Emelda Fear will receive a reminder letter in the mail two months in advance. If you don't receive a letter, please call our office to schedule the follow-up appointment.   Any Other Special Instructions Will Be Listed Below (If Applicable).     If you need a refill on your cardiac medications before your next appointment, please call your pharmacy.

## 2017-06-12 DIAGNOSIS — H2513 Age-related nuclear cataract, bilateral: Secondary | ICD-10-CM | POA: Diagnosis not present

## 2017-06-12 DIAGNOSIS — E119 Type 2 diabetes mellitus without complications: Secondary | ICD-10-CM | POA: Diagnosis not present

## 2017-07-11 ENCOUNTER — Other Ambulatory Visit: Payer: Self-pay | Admitting: Physician Assistant

## 2017-07-21 DIAGNOSIS — Z23 Encounter for immunization: Secondary | ICD-10-CM | POA: Diagnosis not present

## 2017-07-28 ENCOUNTER — Other Ambulatory Visit: Payer: Self-pay | Admitting: Cardiovascular Disease

## 2017-07-31 ENCOUNTER — Other Ambulatory Visit: Payer: Self-pay

## 2017-07-31 NOTE — Telephone Encounter (Signed)
BRILINTA 90 MG TABS tablet 60 tablet 10 07/31/2017    Sig - Route: TAKE 1 TABLET (90 MG TOTAL) BY MOUTH 2 (TWO) TIMES DAILY. - Oral   Sent to pharmacy as: BRILINTA 90 MG Tab tablet   E-Prescribing Status: Receipt confirmed by pharmacy (07/31/2017 8:32 AM EDT)   Pharmacy   [id number] [place], [place]

## 2017-08-02 DIAGNOSIS — H2512 Age-related nuclear cataract, left eye: Secondary | ICD-10-CM | POA: Diagnosis not present

## 2017-08-08 DIAGNOSIS — H52202 Unspecified astigmatism, left eye: Secondary | ICD-10-CM | POA: Diagnosis not present

## 2017-08-08 DIAGNOSIS — H25812 Combined forms of age-related cataract, left eye: Secondary | ICD-10-CM | POA: Diagnosis not present

## 2017-08-09 DIAGNOSIS — Z79899 Other long term (current) drug therapy: Secondary | ICD-10-CM | POA: Diagnosis not present

## 2017-08-09 DIAGNOSIS — Z48810 Encounter for surgical aftercare following surgery on the sense organs: Secondary | ICD-10-CM | POA: Diagnosis not present

## 2017-08-09 DIAGNOSIS — Z9842 Cataract extraction status, left eye: Secondary | ICD-10-CM | POA: Diagnosis not present

## 2017-08-09 DIAGNOSIS — Z961 Presence of intraocular lens: Secondary | ICD-10-CM | POA: Diagnosis not present

## 2017-08-09 DIAGNOSIS — H2511 Age-related nuclear cataract, right eye: Secondary | ICD-10-CM | POA: Diagnosis not present

## 2017-08-25 ENCOUNTER — Other Ambulatory Visit: Payer: Self-pay | Admitting: Cardiology

## 2017-09-11 ENCOUNTER — Other Ambulatory Visit: Payer: Self-pay | Admitting: Cardiovascular Disease

## 2017-09-19 ENCOUNTER — Other Ambulatory Visit: Payer: Self-pay | Admitting: Physician Assistant

## 2017-09-19 MED ORDER — AMLODIPINE BESYLATE 10 MG PO TABS: 10.0000 mg | ORAL_TABLET | Freq: Every day | ORAL | 2 refills | Status: DC

## 2017-11-06 DIAGNOSIS — Z7984 Long term (current) use of oral hypoglycemic drugs: Secondary | ICD-10-CM | POA: Diagnosis not present

## 2017-11-06 DIAGNOSIS — E1165 Type 2 diabetes mellitus with hyperglycemia: Secondary | ICD-10-CM | POA: Diagnosis not present

## 2017-11-14 DIAGNOSIS — E7800 Pure hypercholesterolemia, unspecified: Secondary | ICD-10-CM | POA: Diagnosis not present

## 2017-11-14 DIAGNOSIS — I2510 Atherosclerotic heart disease of native coronary artery without angina pectoris: Secondary | ICD-10-CM | POA: Diagnosis not present

## 2017-11-14 DIAGNOSIS — I10 Essential (primary) hypertension: Secondary | ICD-10-CM | POA: Diagnosis not present

## 2017-11-14 DIAGNOSIS — I350 Nonrheumatic aortic (valve) stenosis: Secondary | ICD-10-CM | POA: Diagnosis not present

## 2017-11-14 DIAGNOSIS — E1165 Type 2 diabetes mellitus with hyperglycemia: Secondary | ICD-10-CM | POA: Diagnosis not present

## 2017-11-14 DIAGNOSIS — Z955 Presence of coronary angioplasty implant and graft: Secondary | ICD-10-CM | POA: Diagnosis not present

## 2017-12-14 ENCOUNTER — Other Ambulatory Visit: Payer: Self-pay | Admitting: Cardiovascular Disease

## 2017-12-25 ENCOUNTER — Encounter: Payer: Self-pay | Admitting: Cardiovascular Disease

## 2017-12-25 ENCOUNTER — Ambulatory Visit (INDEPENDENT_AMBULATORY_CARE_PROVIDER_SITE_OTHER): Payer: Medicare Other | Admitting: Cardiovascular Disease

## 2017-12-25 VITALS — BP 130/76 | HR 76 | Ht 69.0 in | Wt 214.0 lb

## 2017-12-25 DIAGNOSIS — E7800 Pure hypercholesterolemia, unspecified: Secondary | ICD-10-CM | POA: Diagnosis not present

## 2017-12-25 DIAGNOSIS — I350 Nonrheumatic aortic (valve) stenosis: Secondary | ICD-10-CM

## 2017-12-25 DIAGNOSIS — I2510 Atherosclerotic heart disease of native coronary artery without angina pectoris: Secondary | ICD-10-CM

## 2017-12-25 NOTE — Progress Notes (Signed)
Cardiology Office Note Date:  12/25/2017   ID:  [name] [name], [name] [name] 04-11-1946, MRN [id number]  PCP:  Leighton Ruff, MD  Cardiologist:  Sherren Mocha, MD    Chief Complaint  Patient presents with  . Follow-up    CAD     History of Present Illness: [name] [name] is a 72 y.o. male who presents for CAD s/p CABG in 2009 with stenting of SVG early after surgery in setting of NSTEMI, heart catheterization in 2012 showed continued patency of his grafts and stent. In 2017 he underwent cardiac catheterization for recurrent dyspnea and angina that showed critical restenosis of the SVG to circ requiring PCI with DES.  He is feeling well. Here alone today for follow-up. Still traveling regularly with his work. He's had no recent problems with chest pain or shortness of breath. Just carried a 25 pound box for a long distance without symptoms.   Past Medical History:  Diagnosis Date  . Arrhythmia    Premature beats  . Atherosclerosis of native arteries of the extremities with intermittent claudication   . Coronary atherosclerosis of artery bypass graft    CABG 2009, LHC 07/2016 restenosis of SVG to OM treated with DES  . Diabetes mellitus ([place])   . Essential hypertension, benign   . Pure hypercholesterolemia     Past Surgical History:  Procedure Laterality Date  . ADENOIDECTOMY    . CARDIAC CATHETERIZATION N/A 07/18/2016   Procedure: Left Heart Cath and Cors/Grafts Angiography;  Surgeon: Burnell Blanks, MD;  Location: [place] [facility];  Service: Cardiovascular;  Laterality: N/A;  . CARDIAC CATHETERIZATION N/A 07/18/2016   Procedure: Coronary Stent Intervention;  Surgeon: Burnell Blanks, MD;  Location: [place] [facility];  Service: Cardiovascular;  Laterality: N/A;  . CHOLECYSTECTOMY    . CORONARY ANGIOPLASTY WITH STENT PLACEMENT    . CORONARY ARTERY BYPASS GRAFT    . INGUINAL HERNIA REPAIR Right   . TONSILLECTOMY      Current Outpatient Medications  Medication  Sig Dispense Refill  . amLODipine (NORVASC) 10 MG tablet Take 1 tablet (10 mg total) by mouth daily. 90 tablet 2  . aspirin EC 81 MG tablet Take 81 mg by mouth daily.    . benazepril (LOTENSIN) 40 MG tablet Take 40 mg by mouth daily.     Marland Kitchen BRILINTA 90 MG TABS tablet TAKE 1 TABLET (90 MG TOTAL) BY MOUTH 2 (TWO) TIMES DAILY. 60 tablet 10  . Cyanocobalamin (VITAMIN B-12 PO) Take 1 tablet by mouth daily.    Marland Kitchen glimepiride (AMARYL) 2 MG tablet Take 2 mg by mouth daily with breakfast.    . hydrochlorothiazide (HYDRODIURIL) 25 MG tablet TAKE 1 TABLET BY MOUTH DAILY 30 tablet 10  . isosorbide mononitrate (IMDUR) 30 MG 24 hr tablet TAKE 1 TABLET BY MOUTH DAILY 30 tablet 9  . metFORMIN (GLUCOPHAGE) 500 MG tablet Take 1,000 mg by mouth 2 (two) times daily.     . metoprolol tartrate (LOPRESSOR) 50 MG tablet TAKE 1.5 TABLETS (75 MG TOTAL) BY MOUTH 2 (TWO) TIMES DAILY. 90 tablet 4  . nitroGLYCERIN (NITROSTAT) 0.4 MG SL tablet Place 0.4 mg under the tongue every 5 (five) minutes as needed for chest pain.    . rosuvastatin (CRESTOR) 20 MG tablet Take 1 tablet (20 mg total) by mouth daily. 30 tablet 7   No current facility-administered medications for this visit.     Allergies:   Patient has no known allergies.   Social History:  The patient  reports that  has never smoked. he has never used smokeless tobacco. He reports that he does not drink alcohol or use drugs.   Family History:  The patient's family history includes Heart disease in his mother.   ROS:  Please see the history of present illness.  All other systems are reviewed and negative.   PHYSICAL EXAM: VS:  BP 130/76   Pulse 76   Ht 5' 9"  (1.753 m)   Wt 214 lb (97.1 kg)   BMI 31.60 kg/m  , BMI Body mass index is 31.6 kg/m. GEN: Well nourished, well developed, in no acute distress  HEENT: normal  Neck: no JVD, no masses. No carotid bruits Cardiac: RRR with 2/6 harsh mid peaking systolic murmur at the LLSB              Respiratory:  clear  to auscultation bilaterally, normal work of breathing GI: soft, nontender, nondistended, + BS MS: no deformity or atrophy  Ext: no pretibial edema, pedal pulses 2+= bilaterally Skin: warm and dry, no rash Neuro:  Strength and sensation are intact Psych: euthymic mood, full affect  EKG:  EKG is ordered today. The ekg ordered today shows NSR 76 bpm, nonspecific ST abnormality  Recent Labs: No results found for requested labs within last 8760 hours.   Lipid Panel     Component Value Date/Time   CHOL 109 07/18/2016 0037   TRIG 220 (H) 07/18/2016 0037   HDL 25 (L) 07/18/2016 0037   CHOLHDL 4.4 07/18/2016 0037   VLDL 44 (H) 07/18/2016 0037   LDLCALC 40 07/18/2016 0037      Wt Readings from Last 3 Encounters:  12/25/17 214 lb (97.1 kg)  05/24/17 214 lb 9.6 oz (97.3 kg)  11/18/16 217 lb 6.4 oz (98.6 kg)     Cardiac Studies Reviewed: Echo 05-30-2016: Left ventricle:  The cavity size was normal. Wall thickness was increased in a pattern of mild LVH. Systolic function was normal. The estimated ejection fraction was in the range of 55% to 60%. Wall motion was normal; there were no regional wall motion abnormalities. Doppler parameters are consistent with abnormal left ventricular relaxation (grade 1 diastolic dysfunction).  ------------------------------------------------------------------- Aortic valve:   Moderately thickened, moderately calcified leaflets.  Doppler:   There was mild stenosis.      VTI ratio of LVOT to aortic valve: 0.5. Valve area (VTI): 1.73 cm^2. Indexed valve area (VTI): 0.78 cm^2/m^2. Peak velocity ratio of LVOT to aortic valve: 0.44. Valve area (Vmax): 1.51 cm^2. Indexed valve area (Vmax): 0.68 cm^2/m^2. Mean velocity ratio of LVOT to aortic valve: 0.46. Valve area (Vmean): 1.58 cm^2. Indexed valve area (Vmean): 0.71 cm^2/m^2.    Mean gradient (S): 11 mm Hg. Peak gradient (S): 21 mm  Hg.  ------------------------------------------------------------------- Aorta:  Aortic root: The aortic root was normal in size. Ascending aorta: The ascending aorta was normal in size.  ------------------------------------------------------------------- Mitral valve:   Structurally normal valve.   Leaflet separation was normal.  Doppler:  Transvalvular velocity was within the normal range. There was no evidence for stenosis. There was no regurgitation.    Peak gradient (D): 2 mm Hg.  ------------------------------------------------------------------- Left atrium:  The atrium was normal in size.  ------------------------------------------------------------------- Right ventricle:  The cavity size was normal. Systolic function was normal.  ------------------------------------------------------------------- Pulmonic valve:    The valve appears to be grossly normal. Doppler:  There was no significant regurgitation.  ------------------------------------------------------------------- Tricuspid valve:   Structurally normal valve.   Leaflet separation  was normal.  Doppler:  Transvalvular velocity was within the normal range. There was no regurgitation.  ------------------------------------------------------------------- Pulmonary artery:   Systolic pressure was within the normal range.   ------------------------------------------------------------------- Right atrium:  The atrium was normal in size.  ------------------------------------------------------------------- Pericardium:  There was no pericardial effusion.  Cardiac Cath 07-18-2016: Conclusion     Prox RCA to Mid RCA lesion, 30 %stenosed.  Mid RCA to Dist RCA lesion, 60 %stenosed.  SVG graft was visualized by angiography and is normal in caliber and anatomically normal.  Dist RCA lesion, 40 %stenosed.  Ost Cx to Prox Cx lesion, 99 %stenosed.  SVG graft was visualized by angiography and is normal in  caliber.  Dist Graft to Insertion lesion, 99 %stenosed.  Ost LAD to Prox LAD lesion, 100 %stenosed.  SVG graft was visualized by angiography and is normal in caliber and anatomically normal.  LIMA graft was visualized by angiography and is normal in caliber and anatomically normal.  Dist LAD lesion, 70 %stenosed.  LV end diastolic pressure is mildly elevated.   1. 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  2. The LAD is occluded proximally. The LIMA graft is patent to the mid LAD and fills the mid and distal vessel. The patent vein graft fills the diagonal branch.  3. The Circumflex is occluded proximally. The OM branch fills from a patent vein graft. The vein graft has severe restenosis in the stented segment at the anastomosis of the vein graft to the OM.  4. The RCA has moderate disease throughout. There is competitive flow distally from the patent vein graft to the distal RCA.  5. Successful PTCA/DES x 1 distal body of SVG to OM.   Recommendations: Continue DAPT with ASA and Brilinta (He will be enrolled in the TWILIGHT study protocol). Continue statin and beta blocker.     ASSESSMENT AND PLAN: 1.  CAD, native vessel, without angina: The patient appears stable on his current medical regimen.  He will continue on dual antiplatelet therapy with aspirin and ticagrelor as tolerated.  I will see him back in 6 months.  2.  Hypertension: Blood pressure is controlled on amlodipine, hydrochlorothiazide, isosorbide, and benazepril.  3.  Hyperlipidemia: The patient is treated with Crestor 20 mg daily.  Last lipids from August 2018 by his primary physician showed a cholesterol of 136 with an LDL of 61.  4.  Type 2 diabetes, uncontrolled: Lengthy discussion about lifestyle modification today.  Oral hypoglycemics are being managed by his primary physician.  Medicine adjustments were recently made.  Hemoglobin A1c was 11.1.  He will work on weight loss and exercise with  dietary modification.  5.  Mild aortic stenosis: Last echo from 2017 is reviewed.  Will update an echocardiogram when he returns for follow-up in 6 months.  Current medicines are reviewed with the patient today.  The patient does not have concerns regarding medicines.  Labs/ tests ordered today include:   Orders Placed This Encounter  Procedures  . EKG 12-Lead  . ECHOCARDIOGRAM COMPLETE    Disposition:   FU 6 months with an echocardiogram   Signed, Sherren Mocha, MD  12/25/2017 5:11 PM    [facility] [place], [place], [place]  [address] Phone: [phone]; Fax: [phone]

## 2017-12-25 NOTE — Patient Instructions (Signed)
Medication Instructions:  Your provider recommends that you continue on your current medications as directed. Please refer to the Current Medication list given to you today.    Labwork: None  Testing/Procedures: Your provider has requested that you have an echocardiogram in 6 months. Echocardiography is a painless test that uses sound waves to create images of your heart. It provides your doctor with information about the size and shape of your heart and how well your heart's chambers and valves are working. This procedure takes approximately one hour. There are no restrictions for this procedure.  Follow-Up: Your provider wants you to follow-up in: 6 months with Dr. [name]. You will receive a reminder letter in the mail two months in advance. If you don't receive a letter, please call our office to schedule the follow-up appointment.    Any Other Special Instructions Will Be Listed Below (If Applicable).     If you need a refill on your cardiac medications before your next appointment, please call your pharmacy.

## 2018-01-10 ENCOUNTER — Telehealth: Payer: Self-pay

## 2018-01-10 NOTE — Telephone Encounter (Signed)
-----   Message from Theodoro Parma, RN sent at 12/25/2017  3:34 PM EDT ----- Regarding: 6 mo echo/MC OV due 06/27/2018 Same day for AS

## 2018-01-10 NOTE — Telephone Encounter (Signed)
6 mo echo and OV with Dr. [name] scheduled 07/02/18. Patient agrees with treatment plan.

## 2018-01-22 DIAGNOSIS — N400 Enlarged prostate without lower urinary tract symptoms: Secondary | ICD-10-CM | POA: Diagnosis not present

## 2018-01-29 DIAGNOSIS — N400 Enlarged prostate without lower urinary tract symptoms: Secondary | ICD-10-CM | POA: Diagnosis not present

## 2018-02-05 DIAGNOSIS — I350 Nonrheumatic aortic (valve) stenosis: Secondary | ICD-10-CM | POA: Diagnosis not present

## 2018-02-05 DIAGNOSIS — Z955 Presence of coronary angioplasty implant and graft: Secondary | ICD-10-CM | POA: Diagnosis not present

## 2018-02-05 DIAGNOSIS — I10 Essential (primary) hypertension: Secondary | ICD-10-CM | POA: Diagnosis not present

## 2018-02-05 DIAGNOSIS — I2510 Atherosclerotic heart disease of native coronary artery without angina pectoris: Secondary | ICD-10-CM | POA: Diagnosis not present

## 2018-02-05 DIAGNOSIS — Z7984 Long term (current) use of oral hypoglycemic drugs: Secondary | ICD-10-CM | POA: Diagnosis not present

## 2018-02-05 DIAGNOSIS — E7800 Pure hypercholesterolemia, unspecified: Secondary | ICD-10-CM | POA: Diagnosis not present

## 2018-02-05 DIAGNOSIS — E1165 Type 2 diabetes mellitus with hyperglycemia: Secondary | ICD-10-CM | POA: Diagnosis not present

## 2018-04-25 ENCOUNTER — Encounter: Payer: Self-pay | Admitting: Endocrinology

## 2018-04-25 DIAGNOSIS — E1165 Type 2 diabetes mellitus with hyperglycemia: Secondary | ICD-10-CM | POA: Diagnosis not present

## 2018-04-25 DIAGNOSIS — I10 Essential (primary) hypertension: Secondary | ICD-10-CM | POA: Diagnosis not present

## 2018-04-25 DIAGNOSIS — Z961 Presence of intraocular lens: Secondary | ICD-10-CM | POA: Diagnosis not present

## 2018-04-25 DIAGNOSIS — I350 Nonrheumatic aortic (valve) stenosis: Secondary | ICD-10-CM | POA: Diagnosis not present

## 2018-04-25 DIAGNOSIS — Z9842 Cataract extraction status, left eye: Secondary | ICD-10-CM | POA: Diagnosis not present

## 2018-04-25 DIAGNOSIS — Z955 Presence of coronary angioplasty implant and graft: Secondary | ICD-10-CM | POA: Diagnosis not present

## 2018-04-25 DIAGNOSIS — E7800 Pure hypercholesterolemia, unspecified: Secondary | ICD-10-CM | POA: Diagnosis not present

## 2018-04-25 DIAGNOSIS — H2511 Age-related nuclear cataract, right eye: Secondary | ICD-10-CM | POA: Diagnosis not present

## 2018-04-25 DIAGNOSIS — I2510 Atherosclerotic heart disease of native coronary artery without angina pectoris: Secondary | ICD-10-CM | POA: Diagnosis not present

## 2018-05-12 ENCOUNTER — Other Ambulatory Visit: Payer: Self-pay | Admitting: Cardiovascular Disease

## 2018-05-21 ENCOUNTER — Other Ambulatory Visit: Payer: Self-pay | Admitting: Cardiovascular Disease

## 2018-05-28 DIAGNOSIS — Z136 Encounter for screening for cardiovascular disorders: Secondary | ICD-10-CM | POA: Diagnosis not present

## 2018-05-28 DIAGNOSIS — Z1389 Encounter for screening for other disorder: Secondary | ICD-10-CM | POA: Diagnosis not present

## 2018-05-28 DIAGNOSIS — E7800 Pure hypercholesterolemia, unspecified: Secondary | ICD-10-CM | POA: Diagnosis not present

## 2018-05-28 DIAGNOSIS — I10 Essential (primary) hypertension: Secondary | ICD-10-CM | POA: Diagnosis not present

## 2018-05-28 DIAGNOSIS — R7989 Other specified abnormal findings of blood chemistry: Secondary | ICD-10-CM | POA: Diagnosis not present

## 2018-05-28 DIAGNOSIS — E1165 Type 2 diabetes mellitus with hyperglycemia: Secondary | ICD-10-CM | POA: Diagnosis not present

## 2018-05-28 DIAGNOSIS — Z23 Encounter for immunization: Secondary | ICD-10-CM | POA: Diagnosis not present

## 2018-05-28 DIAGNOSIS — Z0000 Encounter for general adult medical examination without abnormal findings: Secondary | ICD-10-CM | POA: Diagnosis not present

## 2018-05-28 DIAGNOSIS — E781 Pure hyperglyceridemia: Secondary | ICD-10-CM | POA: Diagnosis not present

## 2018-05-28 DIAGNOSIS — I2510 Atherosclerotic heart disease of native coronary artery without angina pectoris: Secondary | ICD-10-CM | POA: Diagnosis not present

## 2018-05-28 DIAGNOSIS — Z955 Presence of coronary angioplasty implant and graft: Secondary | ICD-10-CM | POA: Diagnosis not present

## 2018-05-28 DIAGNOSIS — I350 Nonrheumatic aortic (valve) stenosis: Secondary | ICD-10-CM | POA: Diagnosis not present

## 2018-05-28 DIAGNOSIS — N400 Enlarged prostate without lower urinary tract symptoms: Secondary | ICD-10-CM | POA: Diagnosis not present

## 2018-05-30 DIAGNOSIS — H2511 Age-related nuclear cataract, right eye: Secondary | ICD-10-CM | POA: Diagnosis not present

## 2018-05-30 DIAGNOSIS — Z961 Presence of intraocular lens: Secondary | ICD-10-CM | POA: Diagnosis not present

## 2018-05-31 ENCOUNTER — Other Ambulatory Visit: Payer: Self-pay | Admitting: Family Medicine

## 2018-05-31 DIAGNOSIS — R0989 Other specified symptoms and signs involving the circulatory and respiratory systems: Secondary | ICD-10-CM

## 2018-06-08 ENCOUNTER — Ambulatory Visit
Admission: RE | Admit: 2018-06-08 | Discharge: 2018-06-08 | Disposition: A | Discharge status: Home / Self Care | Payer: Medicare Other | Source: Ambulatory Visit | Attending: Family Medicine | Admitting: Family Medicine

## 2018-06-08 DIAGNOSIS — R0989 Other specified symptoms and signs involving the circulatory and respiratory systems: Secondary | ICD-10-CM

## 2018-06-08 DIAGNOSIS — I6523 Occlusion and stenosis of bilateral carotid arteries: Secondary | ICD-10-CM | POA: Diagnosis not present

## 2018-06-12 DIAGNOSIS — H25811 Combined forms of age-related cataract, right eye: Secondary | ICD-10-CM | POA: Diagnosis not present

## 2018-06-13 DIAGNOSIS — Z961 Presence of intraocular lens: Secondary | ICD-10-CM | POA: Diagnosis not present

## 2018-06-13 DIAGNOSIS — Z48810 Encounter for surgical aftercare following surgery on the sense organs: Secondary | ICD-10-CM | POA: Diagnosis not present

## 2018-06-13 DIAGNOSIS — E119 Type 2 diabetes mellitus without complications: Secondary | ICD-10-CM | POA: Diagnosis not present

## 2018-06-13 DIAGNOSIS — Z7984 Long term (current) use of oral hypoglycemic drugs: Secondary | ICD-10-CM | POA: Diagnosis not present

## 2018-06-14 ENCOUNTER — Other Ambulatory Visit: Payer: Self-pay | Admitting: Family Medicine

## 2018-06-14 ENCOUNTER — Other Ambulatory Visit: Payer: Self-pay

## 2018-06-14 DIAGNOSIS — R221 Localized swelling, mass and lump, neck: Secondary | ICD-10-CM

## 2018-06-14 DIAGNOSIS — I6523 Occlusion and stenosis of bilateral carotid arteries: Secondary | ICD-10-CM

## 2018-06-22 ENCOUNTER — Ambulatory Visit
Admission: RE | Admit: 2018-06-22 | Discharge: 2018-06-22 | Disposition: A | Discharge status: Home / Self Care | Payer: Medicare Other | Source: Ambulatory Visit | Attending: Family Medicine | Admitting: Family Medicine

## 2018-06-22 DIAGNOSIS — R221 Localized swelling, mass and lump, neck: Secondary | ICD-10-CM

## 2018-06-22 MED ORDER — IOPAMIDOL (ISOVUE-300) INJECTION 61%
75.0000 mL | Freq: Once | INTRAVENOUS | Status: AC | PRN
  Administered 2018-06-22: 75 mL via INTRAVENOUS

## 2018-06-28 ENCOUNTER — Encounter: Payer: Medicare Other | Attending: Dietician | Admitting: Dietician

## 2018-06-28 ENCOUNTER — Ambulatory Visit (INDEPENDENT_AMBULATORY_CARE_PROVIDER_SITE_OTHER): Payer: Medicare Other | Admitting: Endocrinology

## 2018-06-28 ENCOUNTER — Encounter: Payer: Self-pay | Admitting: Endocrinology

## 2018-06-28 VITALS — BP 122/74 | HR 73 | Ht 69.0 in | Wt 212.4 lb

## 2018-06-28 DIAGNOSIS — I6523 Occlusion and stenosis of bilateral carotid arteries: Secondary | ICD-10-CM

## 2018-06-28 DIAGNOSIS — E1165 Type 2 diabetes mellitus with hyperglycemia: Secondary | ICD-10-CM

## 2018-06-28 DIAGNOSIS — E1159 Type 2 diabetes mellitus with other circulatory complications: Secondary | ICD-10-CM | POA: Diagnosis not present

## 2018-06-28 DIAGNOSIS — Z860100 Personal history of colon polyps, unspecified: Secondary | ICD-10-CM | POA: Insufficient documentation

## 2018-06-28 DIAGNOSIS — K7581 Nonalcoholic steatohepatitis (NASH): Secondary | ICD-10-CM

## 2018-06-28 DIAGNOSIS — E119 Type 2 diabetes mellitus without complications: Secondary | ICD-10-CM | POA: Insufficient documentation

## 2018-06-28 DIAGNOSIS — E7800 Pure hypercholesterolemia, unspecified: Secondary | ICD-10-CM | POA: Insufficient documentation

## 2018-06-28 DIAGNOSIS — Z713 Dietary counseling and surveillance: Secondary | ICD-10-CM | POA: Diagnosis not present

## 2018-06-28 DIAGNOSIS — Z794 Long term (current) use of insulin: Secondary | ICD-10-CM | POA: Insufficient documentation

## 2018-06-28 LAB — POCT GLYCOSYLATED HEMOGLOBIN (HGB A1C): HEMOGLOBIN A1C: 10 % — AB (ref 4.0–5.6)

## 2018-06-28 MED ORDER — SEMAGLUTIDE(0.25 OR 0.5MG/DOS) 2 MG/1.5ML ~~LOC~~ SOPN: 0.2500 mg | PEN_INJECTOR | SUBCUTANEOUS | 11 refills | Status: DC

## 2018-06-28 NOTE — Progress Notes (Signed)
Subjective:    Patient ID: [name] [name], male    DOB: 1946-08-27, 72 y.o.   MRN: [id number]  HPI pt is referred by Dr Drema Dallas, for diabetes.  Pt states DM was dx'ed in 2010; he has mild if any neuropathy of the lower extremities, but he has associated CAD; he has never been on insulin; pt says his diet and exercise are poor; he has never had pancreatitis, pancreatic surgery, severe hypoglycemia or DKA.  He says cbg's are approx 200.   Past Medical History:  Diagnosis Date  . Arrhythmia    Premature beats  . Atherosclerosis of native arteries of the extremities with intermittent claudication   . Coronary atherosclerosis of artery bypass graft    CABG 2009, LHC 07/2016 restenosis of SVG to OM treated with DES  . Diabetes mellitus ([place])   . Essential hypertension, benign   . Pure hypercholesterolemia     Past Surgical History:  Procedure Laterality Date  . ADENOIDECTOMY    . CARDIAC CATHETERIZATION N/A 07/18/2016   Procedure: Left Heart Cath and Cors/Grafts Angiography;  Surgeon: Burnell Blanks, MD;  Location: [facility];  Service: Cardiovascular;  Laterality: N/A;  . CARDIAC CATHETERIZATION N/A 07/18/2016   Procedure: Coronary Stent Intervention;  Surgeon: Burnell Blanks, MD;  Location: [facility];  Service: Cardiovascular;  Laterality: N/A;  . CHOLECYSTECTOMY    . CORONARY ANGIOPLASTY WITH STENT PLACEMENT    . CORONARY ARTERY BYPASS GRAFT    . INGUINAL HERNIA REPAIR Right   . TONSILLECTOMY      Social History   Socioeconomic History  . Marital status: Married    Spouse name: Not on file  . Number of children: Not on file  . Years of education: Not on file  . Highest education level: Not on file  Occupational History  . Not on file  Social Needs  . Financial resource strain: Not on file  . Food insecurity:    Worry: Not on file    Inability: Not on file  . Transportation needs:    Medical: Not on file    Non-medical: Not on file  Tobacco  Use  . Smoking status: Never Smoker  . Smokeless tobacco: Never Used  Substance and Sexual Activity  . Alcohol use: No  . Drug use: No  . Sexual activity: Not on file  Lifestyle  . Physical activity:    Days per week: Not on file    Minutes per session: Not on file  . Stress: Not on file  Relationships  . Social connections:    Talks on phone: Not on file    Gets together: Not on file    Attends religious service: Not on file    Active member of club or organization: Not on file    Attends meetings of clubs or organizations: Not on file    Relationship status: Not on file  . Intimate partner violence:    Fear of current or ex partner: Not on file    Emotionally abused: Not on file    Physically abused: Not on file    Forced sexual activity: Not on file  Other Topics Concern  . Not on file  Social History Narrative  . Not on file    Current Outpatient Medications on File Prior to Visit  Medication Sig Dispense Refill  . amLODipine (NORVASC) 10 MG tablet Take 1 tablet (10 mg total) by mouth daily. 90 tablet 2  . aspirin EC  81 MG tablet Take 81 mg by mouth daily.    . benazepril (LOTENSIN) 40 MG tablet Take 40 mg by mouth daily.     Marland Kitchen BRILINTA 90 MG TABS tablet TAKE 1 TABLET (90 MG TOTAL) BY MOUTH 2 (TWO) TIMES DAILY. 60 tablet 10  . Cyanocobalamin (VITAMIN B-12 PO) Take 1 tablet by mouth daily.    Marland Kitchen glimepiride (AMARYL) 2 MG tablet Take 2 mg by mouth daily with breakfast.    . hydrochlorothiazide (HYDRODIURIL) 25 MG tablet TAKE 1 TABLET BY MOUTH DAILY 30 tablet 10  . isosorbide mononitrate (IMDUR) 30 MG 24 hr tablet TAKE 1 TABLET BY MOUTH DAILY 30 tablet 9  . metFORMIN (GLUCOPHAGE) 500 MG tablet Take 1,000 mg by mouth 2 (two) times daily.     . metoprolol tartrate (LOPRESSOR) 50 MG tablet TAKE 1.5 TABLETS (75 MG TOTAL) BY MOUTH 2 (TWO) TIMES DAILY. 270 tablet 2  . nitroGLYCERIN (NITROSTAT) 0.4 MG SL tablet Place 0.4 mg under the tongue every 5 (five) minutes as needed for  chest pain.    . rosuvastatin (CRESTOR) 20 MG tablet TAKE 1 TABLET (20 MG TOTAL) BY MOUTH DAILY. 90 tablet 1   No current facility-administered medications on file prior to visit.     No Known Allergies  Family History  Problem Relation Age of Onset  . Heart disease Mother        No details  . Diabetes Neg Hx     BP 122/74 (BP Location: Left Arm)   Pulse 73   Ht 5' 9"  (1.753 m)   Wt 212 lb 6.4 oz (96.3 kg)   SpO2 97%   BMI 31.37 kg/m     Review of Systems denies weight loss, blurry vision, headache, chest pain, sob, n/v, urinary frequency, muscle cramps, excessive diaphoresis, memory loss, depression, cold intolerance, and rhinorrhea.  He has easy bruising.        Objective:   Physical Exam VS: see vs page GEN: no distress HEAD: head: no deformity eyes: no periorbital swelling, no proptosis external nose and ears are normal mouth: no lesion seen EARS: bilat HA's NECK: supple, thyroid is not enlarged CHEST WALL: no deformity.  Old healed surgical scar (median sternotomy) LUNGS: clear to auscultation CV: reg rate and rhythm; high-pitched syst murmur ABD: abdomen is soft, nontender.  no hepatosplenomegaly.  not distended.  no hernia MUSCULOSKELETAL: muscle bulk and strength are grossly normal.  no obvious joint swelling.  gait is normal and steady EXTEMITIES: no deformity.  no ulcer on the feet.  feet are of normal color and temp.  no leg edema PULSES: dorsalis pedis intact bilat.  bilat carotid bruits NEURO:  cn 2-12 grossly intact.   readily moves all 4's.  sensation is intact to touch on the feet SKIN:  Normal texture and temperature.  No rash or suspicious lesion is visible.   NODES:  None palpable at the neck PSYCH: alert, well-oriented.  Does not appear anxious nor depressed.   Lab Results  Component Value Date   HGBA1C 10.0 (A) 06/28/2018   Lab Results  Component Value Date   CREATININE 0.84 07/19/2016   BUN 6 07/19/2016   NA 135 07/19/2016   K 3.7  07/19/2016   CL 106 07/19/2016   CO2 22 07/19/2016   Lab Results  Component Value Date   ALT 30 07/17/2016   AST 35 07/17/2016   ALKPHOS 62 07/17/2016   BILITOT 0.7 07/17/2016    I have reviewed outside records, and  summarized: Pt was noted to have elevated a1c, and referred here.  Medication study was offered, but pt declined.  I personally reviewed electrocardiogram tracing (12/25/17): Indication: aortic stenosis Impression: NSR.  No MI.  Flat T-waves Compared to 2018: no significant change     Assessment & Plan:  Type 2 DM, with CAD: severe exacerbation NASH, new to me.  Pioglitazone is an option  Patient Instructions  good diet and exercise significantly improve the control of your diabetes.  please let me know if you wish to be referred to a dietician.  high blood sugar is very risky to your health.  you should see an eye doctor and dentist every year.  It is very important to get all recommended vaccinations.  Controlling your blood pressure and cholesterol drastically reduces the damage diabetes does to your body.  Those who smoke should quit.  Please discuss these with your doctor.  check your blood sugar once a day.  vary the time of day when you check, between before the 3 meals, and at bedtime.  also check if you have symptoms of your blood sugar being too high or too low.  please keep a record of the readings and bring it to your next appointment here (or you can bring the meter itself).  You can write it on any piece of paper.  please call us sooner if your blood sugar goes below 70, or if you have a lot of readings over 200.   I have sent a prescription to your pharmacy, to start [place].   Please call or message [place] in 1-2 weeks, to tell us how the blood sugar is doing.  We will probably need to increase the Ozempic.   The cheapest option is to take insulin from [place].  We can do this if necessary.   Please come back for a follow-up appointment in 2 months.

## 2018-06-28 NOTE — Progress Notes (Signed)
Instructed patient on Ozempic.  Discussed how to use the pen.  Placement of injection, frequency, purpose and side effects.  Patient to review how to inject on [email or web address].   Patient to call for concerns or questions.  Antonieta Iba, RD, LDN, CDE

## 2018-06-28 NOTE — Patient Instructions (Addendum)
good diet and exercise significantly improve the control of your diabetes.  please let me know if you wish to be referred to a dietician.  high blood sugar is very risky to your health.  you should see an eye doctor and dentist every year.  It is very important to get all recommended vaccinations.  Controlling your blood pressure and cholesterol drastically reduces the damage diabetes does to your body.  Those who smoke should quit.  Please discuss these with your doctor.  check your blood sugar once a day.  vary the time of day when you check, between before the 3 meals, and at bedtime.  also check if you have symptoms of your blood sugar being too high or too low.  please keep a record of the readings and bring it to your next appointment here (or you can bring the meter itself).  You can write it on any piece of paper.  please call us sooner if your blood sugar goes below 70, or if you have a lot of readings over 200.   I have sent a prescription to your pharmacy, to start [place].   Please call or message [place] in 1-2 weeks, to tell us how the blood sugar is doing.  We will probably need to increase the Ozempic.   The cheapest option is to take insulin from [place].  We can do this if necessary.   Please come back for a follow-up appointment in 2 months.

## 2018-06-29 ENCOUNTER — Telehealth: Payer: Self-pay | Admitting: Endocrinology

## 2018-06-29 NOTE — Telephone Encounter (Signed)
Patient  Does NOT want to continue with the Ozempic after this month. Please notify patient of a different medication that will be substituted (insulin). Patient's ph# [phone]

## 2018-07-02 ENCOUNTER — Other Ambulatory Visit: Payer: Self-pay

## 2018-07-02 ENCOUNTER — Ambulatory Visit (HOSPITAL_COMMUNITY): Payer: Medicare Other | Attending: Cardiovascular Disease

## 2018-07-02 ENCOUNTER — Encounter: Payer: Self-pay | Admitting: Cardiovascular Disease

## 2018-07-02 ENCOUNTER — Ambulatory Visit (INDEPENDENT_AMBULATORY_CARE_PROVIDER_SITE_OTHER): Payer: Medicare Other | Admitting: Cardiovascular Disease

## 2018-07-02 VITALS — BP 140/70 | HR 67 | Ht 69.0 in | Wt 212.1 lb

## 2018-07-02 DIAGNOSIS — I6523 Occlusion and stenosis of bilateral carotid arteries: Secondary | ICD-10-CM | POA: Diagnosis not present

## 2018-07-02 DIAGNOSIS — E7800 Pure hypercholesterolemia, unspecified: Secondary | ICD-10-CM

## 2018-07-02 DIAGNOSIS — E119 Type 2 diabetes mellitus without complications: Secondary | ICD-10-CM | POA: Diagnosis not present

## 2018-07-02 DIAGNOSIS — I10 Essential (primary) hypertension: Secondary | ICD-10-CM | POA: Diagnosis not present

## 2018-07-02 DIAGNOSIS — E782 Mixed hyperlipidemia: Secondary | ICD-10-CM | POA: Diagnosis not present

## 2018-07-02 DIAGNOSIS — Z951 Presence of aortocoronary bypass graft: Secondary | ICD-10-CM | POA: Insufficient documentation

## 2018-07-02 DIAGNOSIS — I071 Rheumatic tricuspid insufficiency: Secondary | ICD-10-CM | POA: Insufficient documentation

## 2018-07-02 DIAGNOSIS — I252 Old myocardial infarction: Secondary | ICD-10-CM | POA: Insufficient documentation

## 2018-07-02 DIAGNOSIS — I350 Nonrheumatic aortic (valve) stenosis: Secondary | ICD-10-CM

## 2018-07-02 DIAGNOSIS — I2510 Atherosclerotic heart disease of native coronary artery without angina pectoris: Secondary | ICD-10-CM | POA: Diagnosis not present

## 2018-07-02 NOTE — Progress Notes (Signed)
Cardiology Office Note:    Date:  07/02/2018   ID:  [name] [name], DOB [birth year], MRN [id number]  PCP:  Leighton Ruff, MD  Cardiologist:  Sherren Mocha, MD  Electrophysiologist:  None   Referring MD: Leighton Ruff, MD   Chief Complaint  Patient presents with  . Follow-up    CAD    History of Present Illness:    [name] [name] is a 72 y.o. male with a hx of coronary artery disease status post CABG in 2009 and history of stenting of a saphenous vein graft distal anastomotic site soon after surgery when he presented with non-STEMI.  Cardiac catheterization in 2012 demonstrated patency of the stented segment as well as the remaining bypass grafts.  In 2017 he underwent repeat catheterization for recurrent dyspnea and was found to have severe restenosis at the site of previous stenting in the saphenous vein graft, treated at that time with a drug-eluting stent platform.  The patient is here alone today.  He feels well.  He continues to work and has no exertional symptoms with his job.  He denies chest pain, chest pressure, or shortness of breath.  He had no stroke or TIA symptoms.  He has been diagnosed with greater than 70% carotid stenosis after a carotid ultrasound was done for evaluation of an asymptomatic carotid bruit.  This demonstrated right ICA peak systolic velocity of 601 cm/s.  He had less than 50% stenosis of the left ICA.  He has been referred to see Dr. Carlis Abbott with vascular surgery.  Incidentally he was also noted to have an enlarged supraclavicular lymph node on his carotid duplex referred to ENT surgery at [facility].  Past Medical History:  Diagnosis Date  . Arrhythmia    Premature beats  . Atherosclerosis of native arteries of the extremities with intermittent claudication   . Coronary atherosclerosis of artery bypass graft    CABG 2009, LHC 07/2016 restenosis of SVG to OM treated with DES  . Diabetes mellitus ([place])   . Essential hypertension, benign   .  Pure hypercholesterolemia     Past Surgical History:  Procedure Laterality Date  . ADENOIDECTOMY    . CARDIAC CATHETERIZATION N/A 07/18/2016   Procedure: Left Heart Cath and Cors/Grafts Angiography;  Surgeon: Burnell Blanks, MD;  Location: [facility];  Service: Cardiovascular;  Laterality: N/A;  . CARDIAC CATHETERIZATION N/A 07/18/2016   Procedure: Coronary Stent Intervention;  Surgeon: Burnell Blanks, MD;  Location: [facility];  Service: Cardiovascular;  Laterality: N/A;  . CHOLECYSTECTOMY    . CORONARY ANGIOPLASTY WITH STENT PLACEMENT    . CORONARY ARTERY BYPASS GRAFT    . INGUINAL HERNIA REPAIR Right   . TONSILLECTOMY      Current Medications: Current Meds  Medication Sig  . amLODipine (NORVASC) 10 MG tablet Take 1 tablet (10 mg total) by mouth daily.  . benazepril (LOTENSIN) 40 MG tablet Take 40 mg by mouth daily.   Marland Kitchen BRILINTA 90 MG TABS tablet TAKE 1 TABLET (90 MG TOTAL) BY MOUTH 2 (TWO) TIMES DAILY.  Marland Kitchen Cyanocobalamin (VITAMIN B-12 PO) Take 1 tablet by mouth daily.  Marland Kitchen glimepiride (AMARYL) 2 MG tablet Take 2 mg by mouth daily with breakfast.  . hydrochlorothiazide (HYDRODIURIL) 25 MG tablet TAKE 1 TABLET BY MOUTH DAILY  . isosorbide mononitrate (IMDUR) 30 MG 24 hr tablet TAKE 1 TABLET BY MOUTH DAILY  . metFORMIN (GLUCOPHAGE) 500 MG tablet Take 1,000 mg by mouth 2 (two) times  daily.   . metoprolol tartrate (LOPRESSOR) 50 MG tablet TAKE 1.5 TABLETS (75 MG TOTAL) BY MOUTH 2 (TWO) TIMES DAILY.  . nitroGLYCERIN (NITROSTAT) 0.4 MG SL tablet Place 0.4 mg under the tongue every 5 (five) minutes as needed for chest pain.  . rosuvastatin (CRESTOR) 20 MG tablet TAKE 1 TABLET (20 MG TOTAL) BY MOUTH DAILY.  . [DISCONTINUED] aspirin EC 81 MG tablet Take 81 mg by mouth daily.     Allergies:   Patient has no known allergies.   Social History   Socioeconomic History  . Marital status: Married    Spouse name: Not on file  . Number of children: Not on file  .  Years of education: Not on file  . Highest education level: Not on file  Occupational History  . Not on file  Social Needs  . Financial resource strain: Not on file  . Food insecurity:    Worry: Not on file    Inability: Not on file  . Transportation needs:    Medical: Not on file    Non-medical: Not on file  Tobacco Use  . Smoking status: Never Smoker  . Smokeless tobacco: Never Used  Substance and Sexual Activity  . Alcohol use: No  . Drug use: No  . Sexual activity: Not on file  Lifestyle  . Physical activity:    Days per week: Not on file    Minutes per session: Not on file  . Stress: Not on file  Relationships  . Social connections:    Talks on phone: Not on file    Gets together: Not on file    Attends religious service: Not on file    Active member of club or organization: Not on file    Attends meetings of clubs or organizations: Not on file    Relationship status: Not on file  Other Topics Concern  . Not on file  Social History Narrative  . Not on file     Family History: The patient's family history includes Heart disease in his mother. There is no history of Diabetes.  ROS:   Please see the history of present illness.    All other systems reviewed and are negative.  EKGs/Labs/Other Studies Reviewed:    The following studies were reviewed today: Today's echo study is personally reviewed.  There is mild calcification and restriction of the aortic valve.  LV systolic function appears normal.  There does not appear to be more than mild aortic stenosis based on gradient measurements.  The formal interpretation is currently pending.  EKG:  EKG is not ordered today.    Recent Labs: No results found for requested labs within last 8760 hours.  Recent Lipid Panel    Component Value Date/Time   CHOL 109 07/18/2016 0037   TRIG 220 (H) 07/18/2016 0037   HDL 25 (L) 07/18/2016 0037   CHOLHDL 4.4 07/18/2016 0037   VLDL 44 (H) 07/18/2016 0037   LDLCALC 40  07/18/2016 0037    Physical Exam:    VS:  BP 140/70   Pulse 67   Ht 5' 9"  (1.753 m)   Wt 212 lb 1.9 oz (96.2 kg)   SpO2 98%   BMI 31.32 kg/m     Wt Readings from Last 3 Encounters:  07/02/18 212 lb 1.9 oz (96.2 kg)  06/28/18 212 lb 6.4 oz (96.3 kg)  12/25/17 214 lb (97.1 kg)     GEN: Well nourished, well developed in no acute distress HEENT: Normal  NECK: No JVD; bilateral carotid bruits LYMPHATICS: No lymphadenopathy CARDIAC: RRR, 3/6 harsh mid peaking systolic murmur at the right upper sternal border, A2 is present RESPIRATORY:  Clear to auscultation without rales, wheezing or rhonchi  ABDOMEN: Soft, non-tender, non-distended MUSCULOSKELETAL:  No edema; No deformity  SKIN: Warm and dry NEUROLOGIC:  Alert and oriented x 3 PSYCHIATRIC:  Normal affect   ASSESSMENT:    1. Aortic valve stenosis, etiology of cardiac valve disease unspecified   2. Coronary artery disease involving native coronary artery of native heart without angina pectoris   3. Type 2 diabetes mellitus without complication, without long-term current use of insulin ([place])   4. Mixed hyperlipidemia   5. Essential hypertension   6. Bilateral carotid artery stenosis    PLAN:    In order of problems listed above:  1. The patient is echo study done today is personally reviewed.  There is mild aortic stenosis and I will plan to follow him about every other year with an echocardiogram.  I will see him back in 1 year for follow-up evaluation.  We discussed the natural history of aortic stenosis and potential symptoms related to this problem today. 2. The patient is having no anginal symptoms.  He is now out greater than 12 months from his most recent PCI procedure and I have asked him to discontinue aspirin.  He will remain on ticagrelor 90 mg twice daily. 3. We discussed lifestyle modification today.  He is followed by endocrinology. 4. Most recent lipids reviewed and LDL cholesterol is at goal of less than 70  mg/dL.  Triglycerides are elevated at 247 and this is likely reflective of poor glycemic control. 5. Blood pressure is well controlled on current medical program. 6. I reviewed the patient's carotid ultrasound study.  I do not see any report of diastolic velocities.  He appears to be asymptomatic.  He is on antiplatelet therapy as well as a lipid-lowering drug.  Upcoming evaluation by Dr. Carlis Abbott with [facility] noted.  If he requires carotid endarterectomy or TECAR, he would be at acceptable cardiac risk and could proceed without further testing.   Medication Adjustments/Labs and Tests Ordered: Current medicines are reviewed at length with the patient today.  Concerns regarding medicines are outlined above.  No orders of the defined types were placed in this encounter.  No orders of the defined types were placed in this encounter.   Patient Instructions  Medication Instructions:  1) STOP ASPIRIN   Labwork: None  Testing/Procedures: None  Follow-Up: Your provider wants you to follow-up in: 1 year with Dr. [name]. You will receive a reminder letter in the mail two months in advance. If you don't receive a letter, please call our office to schedule the follow-up appointment.    Any Other Special Instructions Will Be Listed Below (If Applicable).     If you need a refill on your cardiac medications before your next appointment, please call your pharmacy.      Signed, Sherren Mocha, MD  07/02/2018 5:07 PM    [place]

## 2018-07-02 NOTE — Telephone Encounter (Signed)
Please advise 

## 2018-07-02 NOTE — Patient Instructions (Addendum)
Medication Instructions:  1) STOP ASPIRIN   Labwork: None  Testing/Procedures: None  Follow-Up: Your provider wants you to follow-up in: 1 year with Dr. [name]. You will receive a reminder letter in the mail two months in advance. If you don't receive a letter, please call our office to schedule the follow-up appointment.    Any Other Special Instructions Will Be Listed Below (If Applicable).     If you need a refill on your cardiac medications before your next appointment, please call your pharmacy.

## 2018-07-05 ENCOUNTER — Other Ambulatory Visit: Payer: Self-pay | Admitting: Physician Assistant

## 2018-07-09 DIAGNOSIS — R599 Enlarged lymph nodes, unspecified: Secondary | ICD-10-CM | POA: Diagnosis not present

## 2018-07-10 ENCOUNTER — Ambulatory Visit (INDEPENDENT_AMBULATORY_CARE_PROVIDER_SITE_OTHER): Payer: Medicare Other | Admitting: Vascular Surgery

## 2018-07-10 ENCOUNTER — Ambulatory Visit (HOSPITAL_COMMUNITY)
Admission: RE | Admit: 2018-07-10 | Discharge: 2018-07-10 | Disposition: A | Discharge status: Home / Self Care | Payer: Medicare Other | Source: Ambulatory Visit | Attending: Vascular Surgery | Admitting: Vascular Surgery

## 2018-07-10 ENCOUNTER — Other Ambulatory Visit: Payer: Self-pay

## 2018-07-10 ENCOUNTER — Other Ambulatory Visit: Payer: Self-pay | Admitting: Internal Medicine

## 2018-07-10 ENCOUNTER — Encounter: Payer: Self-pay | Admitting: Vascular Surgery

## 2018-07-10 ENCOUNTER — Other Ambulatory Visit: Payer: Self-pay | Admitting: Cardiology

## 2018-07-10 DIAGNOSIS — I2510 Atherosclerotic heart disease of native coronary artery without angina pectoris: Secondary | ICD-10-CM | POA: Diagnosis not present

## 2018-07-10 DIAGNOSIS — I6523 Occlusion and stenosis of bilateral carotid arteries: Secondary | ICD-10-CM

## 2018-07-10 DIAGNOSIS — E785 Hyperlipidemia, unspecified: Secondary | ICD-10-CM | POA: Diagnosis not present

## 2018-07-10 DIAGNOSIS — I10 Essential (primary) hypertension: Secondary | ICD-10-CM | POA: Diagnosis not present

## 2018-07-10 DIAGNOSIS — I6521 Occlusion and stenosis of right carotid artery: Secondary | ICD-10-CM

## 2018-07-10 DIAGNOSIS — I6529 Occlusion and stenosis of unspecified carotid artery: Secondary | ICD-10-CM | POA: Insufficient documentation

## 2018-07-10 NOTE — Progress Notes (Signed)
Vitals:   07/10/18 0837 07/10/18 0841 07/10/18 0842  BP: (!) 154/82 (!) 152/81 129/79  Pulse: 80 79 79  Resp: 18    Temp: 98 F (36.7 C)    TempSrc: Oral    SpO2: 97%    Weight: 212 lb (96.2 kg)    Height: 5' 9"  (1.753 m)

## 2018-07-10 NOTE — Progress Notes (Signed)
Patient name: [name] [name] MRN: [id number] DOB: 1946/03/06 Sex: male  REASON FOR CONSULT: 70% right carotid stenosis on screening ultrasound  HPI: [name] [name] is a 72 y.o. male with history of coronary artery disease status post CABG in 2009, diabetes, hypertension, history of NSTEMI most recent echo showed EF of 55 to 60% that presents for evaluation of moderate right carotid stenosis.  Patient states he was being seen by his PCP and noted a bruit on the right side of his neck.  He ultimately had an ultrasound at [facility] radiology that was concerning for 70% right ICA stenosis.  His right ICA velocity was 253 with a ICA to CCA ratio of 2.3.  He denies any focal neurologic signs.  Denies any previous TIA or stroke.  He denies any vision loss.  He is undergoing evaluation by ENT for a nodule along his right clavicle carotid.  No previous head and neck surgery.  Past Medical History:  Diagnosis Date  . Arrhythmia    Premature beats  . Atherosclerosis of native arteries of the extremities with intermittent claudication   . Coronary atherosclerosis of artery bypass graft    CABG 2009, LHC 07/2016 restenosis of SVG to OM treated with DES  . Diabetes mellitus ([place])   . Essential hypertension, benign   . Pure hypercholesterolemia     Past Surgical History:  Procedure Laterality Date  . ADENOIDECTOMY    . CARDIAC CATHETERIZATION N/A 07/18/2016   Procedure: Left Heart Cath and Cors/Grafts Angiography;  Surgeon: Burnell Blanks, MD;  Location: [facility];  Service: Cardiovascular;  Laterality: N/A;  . CARDIAC CATHETERIZATION N/A 07/18/2016   Procedure: Coronary Stent Intervention;  Surgeon: Burnell Blanks, MD;  Location: [facility];  Service: Cardiovascular;  Laterality: N/A;  . CHOLECYSTECTOMY    . CORONARY ANGIOPLASTY WITH STENT PLACEMENT    . CORONARY ARTERY BYPASS GRAFT    . INGUINAL HERNIA REPAIR Right   . TONSILLECTOMY      Family History  Problem  Relation Age of Onset  . Heart disease Mother        No details  . Diabetes Neg Hx     SOCIAL HISTORY: Social History   Socioeconomic History  . Marital status: Married    Spouse name: Not on file  . Number of children: Not on file  . Years of education: Not on file  . Highest education level: Not on file  Occupational History  . Not on file  Social Needs  . Financial resource strain: Not on file  . Food insecurity:    Worry: Not on file    Inability: Not on file  . Transportation needs:    Medical: Not on file    Non-medical: Not on file  Tobacco Use  . Smoking status: Never Smoker  . Smokeless tobacco: Never Used  Substance and Sexual Activity  . Alcohol use: No  . Drug use: No  . Sexual activity: Not on file  Lifestyle  . Physical activity:    Days per week: Not on file    Minutes per session: Not on file  . Stress: Not on file  Relationships  . Social connections:    Talks on phone: Not on file    Gets together: Not on file    Attends religious service: Not on file    Active member of club or organization: Not on file    Attends meetings of clubs or organizations: Not on  file    Relationship status: Not on file  . Intimate partner violence:    Fear of current or ex partner: Not on file    Emotionally abused: Not on file    Physically abused: Not on file    Forced sexual activity: Not on file  Other Topics Concern  . Not on file  Social History Narrative  . Not on file    No Known Allergies  Current Outpatient Medications  Medication Sig Dispense Refill  . amLODipine (NORVASC) 10 MG tablet TAKE 1 TABLET BY MOUTH EVERY DAY 90 tablet 3  . amLODipine (NORVASC) 10 MG tablet TAKE 1 TABLET BY MOUTH EVERY DAY    . benazepril (LOTENSIN) 40 MG tablet Take 40 mg by mouth daily.     Marland Kitchen BRILINTA 90 MG TABS tablet TAKE 1 TABLET (90 MG TOTAL) BY MOUTH 2 (TWO) TIMES DAILY. 60 tablet 10  . Cyanocobalamin (VITAMIN B-12 PO) Take 1 tablet by mouth daily.    Marland Kitchen  glimepiride (AMARYL) 2 MG tablet Take 2 mg by mouth daily with breakfast.    . glimepiride (AMARYL) 2 MG tablet 1 tablet every morning.    . hydrochlorothiazide (HYDRODIURIL) 25 MG tablet TAKE 1 TABLET BY MOUTH DAILY 30 tablet 10  . isosorbide mononitrate (IMDUR) 30 MG 24 hr tablet TAKE 1 TABLET BY MOUTH DAILY 30 tablet 9  . metFORMIN (GLUCOPHAGE) 500 MG tablet Take 1,000 mg by mouth 2 (two) times daily.     . metoprolol tartrate (LOPRESSOR) 50 MG tablet TAKE 1.5 TABLETS (75 MG TOTAL) BY MOUTH 2 (TWO) TIMES DAILY. 270 tablet 2  . nitroGLYCERIN (NITROSTAT) 0.4 MG SL tablet Place 0.4 mg under the tongue every 5 (five) minutes as needed for chest pain.    . rosuvastatin (CRESTOR) 20 MG tablet TAKE 1 TABLET (20 MG TOTAL) BY MOUTH DAILY. 90 tablet 1  . Semaglutide,0.25 or 0.5MG/DOS, (OZEMPIC, 0.25 OR 0.5 MG/DOSE,) 2 MG/1.5ML SOPN INJECT 0.25 MG INTO THE SKIN ONCE A WEEK.     No current facility-administered medications for this visit.     REVIEW OF SYSTEMS:  [X]  denotes positive finding, [ ]  denotes negative finding Cardiac  Comments:  Chest pain or chest pressure:    Shortness of breath upon exertion:    Short of breath when lying flat:    Irregular heart rhythm:        Vascular    Pain in calf, thigh, or hip brought on by ambulation:    Pain in feet at night that wakes you up from your sleep:     Blood clot in your veins:    Leg swelling:         Pulmonary    Oxygen at home:    Productive cough:     Wheezing:         Neurologic    Sudden weakness in arms or legs:     Sudden numbness in arms or legs:     Sudden onset of difficulty speaking or slurred speech:    Temporary loss of vision in one eye:     Problems with dizziness:         Gastrointestinal    Blood in stool:     Vomited blood:         Genitourinary    Burning when urinating:     Blood in urine:        Psychiatric    Major depression:         Hematologic  Bleeding problems:    Problems with blood clotting  too easily:        Skin    Rashes or ulcers:        Constitutional    Fever or chills:      PHYSICAL EXAM: Vitals:   07/10/18 0837 07/10/18 0841 07/10/18 0842  BP: (!) 154/82 (!) 143/81 129/79  Pulse: 80 79 79  Resp: 18    Temp: 98 F (36.7 C)    TempSrc: Oral    SpO2: 97%    Weight: 96.2 kg    Height: 5' 9"  (1.753 m)      GENERAL: The patient is a well-nourished male, in no acute distress. The vital signs are documented above. CARDIAC: There is a regular rate and rhythm.  VASCULAR:  2+ radial pulse bilateral upper extremities. 2+ bilateral femoral pulse palpable groins 2+ palpable DP/PT BLE PULMONARY: There is good air exchange bilaterally without wheezing or rales. ABDOMEN: Soft and non-tender with normal pitched bowel sounds.  MUSCULOSKELETAL: There are no major deformities or cyanosis. NEUROLOGIC: No focal weakness or paresthesias are detected.  CN II-XII grossly intact. SKIN: There are no ulcers or rashes noted. PSYCHIATRIC: The patient has a normal affect.  DATA:   I independently reviewed his duplex from [facility] radiology that shows a right ICA velocity of 253 and ratio 2.3  We repeated a duplex here given no notable diastolic velocity on outside imaging and his velocities on the right ICA were 152/20 with around 50% stenosis.  I did also review his CT soft tissue neck and he has fair amount of calcific disease at his carotid bifurcation that may have underestimated his velocities here in our clinic.  Assessment/Plan:  72 year old male the presents with asymptomatic moderate grade right ICA stenosis.  Discussed with Mr. Vert in detail that given his carotid disease is asymptomatic we typically offer carotid intervention at greater than 80% stenosis.  I reviewed his his duplex from [facility] as well as his duplex here and a recent CT soft tissue of his neck.  His velocities on the duplex here in clinic are probable under estimated given degree of  calcification.  I think he has a moderate grade stenosis that would warrant surveillance for the time being but not a high grade stenosis.  I recommended follow-up with me in 6 months with a repeat carotid duplex at that time.  Discussed with him that if he develops any symptoms in the interim consistent with a TIA or stroke he should let ourr office know because we typically offer carotid intervention for greater than 50% stenosis in the setting of symptomatic disease.  He is on brilinta and crestor for now for medical management.   Marty Heck, MD [facility] Office: [phone] Pager: [place]

## 2018-07-11 ENCOUNTER — Other Ambulatory Visit (HOSPITAL_COMMUNITY): Payer: Self-pay | Admitting: Otolaryngology

## 2018-07-11 DIAGNOSIS — R599 Enlarged lymph nodes, unspecified: Secondary | ICD-10-CM

## 2018-07-18 MED ORDER — INSULIN NPH (HUMAN) (ISOPHANE) 100 UNIT/ML ~~LOC~~ SUSP: 10.0000 [IU] | SUBCUTANEOUS | 11 refills | Status: DC

## 2018-07-18 NOTE — Telephone Encounter (Signed)
Pt stated  that his wife is on Lantus and that he would be able to afford that if sent to [facility] in his chart but if not then a vial and it sent to the [facility] in his chart. He just wants the most inexpensive insulin that's going to work for him

## 2018-07-18 NOTE — Telephone Encounter (Signed)
OK, I have sent a prescription to [facility] Please continue the same other diabetes meds. Please come back for a follow-up appointment in 1-2 weeks

## 2018-07-18 NOTE — Telephone Encounter (Signed)
Ok, do you want to take name brand insulin, which comes in a pen, or cheaper [facility] insulin (which you would need to draw from a bottle)?

## 2018-07-18 NOTE — Telephone Encounter (Signed)
Pt informed

## 2018-07-18 NOTE — Telephone Encounter (Signed)
Patient requesting a call back about note below

## 2018-07-18 NOTE — Addendum Note (Signed)
Addended by: Renato Shin on: 07/18/2018 04:30 PM   Modules accepted: Orders

## 2018-07-19 ENCOUNTER — Other Ambulatory Visit: Payer: Self-pay | Admitting: Radiology

## 2018-07-20 ENCOUNTER — Other Ambulatory Visit: Payer: Self-pay | Admitting: Student

## 2018-07-23 ENCOUNTER — Ambulatory Visit (HOSPITAL_COMMUNITY)
Admission: RE | Admit: 2018-07-23 | Discharge: 2018-07-23 | Disposition: A | Discharge status: Home / Self Care | Payer: Medicare Other | Source: Ambulatory Visit | Attending: Otolaryngology | Admitting: Otolaryngology

## 2018-07-23 DIAGNOSIS — E7800 Pure hypercholesterolemia, unspecified: Secondary | ICD-10-CM | POA: Diagnosis not present

## 2018-07-23 DIAGNOSIS — Z79899 Other long term (current) drug therapy: Secondary | ICD-10-CM | POA: Insufficient documentation

## 2018-07-23 DIAGNOSIS — E119 Type 2 diabetes mellitus without complications: Secondary | ICD-10-CM | POA: Insufficient documentation

## 2018-07-23 DIAGNOSIS — Z951 Presence of aortocoronary bypass graft: Secondary | ICD-10-CM | POA: Diagnosis not present

## 2018-07-23 DIAGNOSIS — I70203 Unspecified atherosclerosis of native arteries of extremities, bilateral legs: Secondary | ICD-10-CM | POA: Diagnosis not present

## 2018-07-23 DIAGNOSIS — Z8249 Family history of ischemic heart disease and other diseases of the circulatory system: Secondary | ICD-10-CM | POA: Insufficient documentation

## 2018-07-23 DIAGNOSIS — I2510 Atherosclerotic heart disease of native coronary artery without angina pectoris: Secondary | ICD-10-CM | POA: Insufficient documentation

## 2018-07-23 DIAGNOSIS — Z794 Long term (current) use of insulin: Secondary | ICD-10-CM | POA: Insufficient documentation

## 2018-07-23 DIAGNOSIS — R591 Generalized enlarged lymph nodes: Secondary | ICD-10-CM | POA: Diagnosis present

## 2018-07-23 DIAGNOSIS — R590 Localized enlarged lymph nodes: Secondary | ICD-10-CM | POA: Insufficient documentation

## 2018-07-23 DIAGNOSIS — Z955 Presence of coronary angioplasty implant and graft: Secondary | ICD-10-CM | POA: Diagnosis not present

## 2018-07-23 DIAGNOSIS — I10 Essential (primary) hypertension: Secondary | ICD-10-CM | POA: Diagnosis not present

## 2018-07-23 DIAGNOSIS — R599 Enlarged lymph nodes, unspecified: Secondary | ICD-10-CM

## 2018-07-23 LAB — CBC
HEMATOCRIT: 44.7 % (ref 39.0–52.0)
Hemoglobin: 14.3 g/dL (ref 13.0–17.0)
MCH: 27.3 pg (ref 26.0–34.0)
MCHC: 32 g/dL (ref 30.0–36.0)
MCV: 85.5 fL (ref 78.0–100.0)
PLATELETS: 298 10*3/uL (ref 150–400)
RBC: 5.23 MIL/uL (ref 4.22–5.81)
RDW: 13.5 % (ref 11.5–15.5)
WBC: 9.5 10*3/uL (ref 4.0–10.5)

## 2018-07-23 LAB — GLUCOSE, CAPILLARY: GLUCOSE-CAPILLARY: 207 mg/dL — AB (ref 70–99)

## 2018-07-23 LAB — PROTIME-INR
INR: 1.04
Prothrombin Time: 13.5 seconds (ref 11.4–15.2)

## 2018-07-23 MED ORDER — LIDOCAINE HCL (PF) 1 % IJ SOLN
INTRAMUSCULAR | Status: AC
  Filled 2018-07-23: qty 30

## 2018-07-23 MED ORDER — SODIUM CHLORIDE 0.9 % IV SOLN: INTRAVENOUS | Status: DC

## 2018-07-23 MED ORDER — MIDAZOLAM HCL 2 MG/2ML IJ SOLN
INTRAMUSCULAR | Status: AC | PRN
  Administered 2018-07-23 (×2): 1 mg via INTRAVENOUS

## 2018-07-23 MED ORDER — FENTANYL CITRATE (PF) 100 MCG/2ML IJ SOLN
INTRAMUSCULAR | Status: AC | PRN
  Administered 2018-07-23 (×2): 50 ug via INTRAVENOUS

## 2018-07-23 MED ORDER — FENTANYL CITRATE (PF) 100 MCG/2ML IJ SOLN
INTRAMUSCULAR | Status: AC
  Filled 2018-07-23: qty 2

## 2018-07-23 MED ORDER — MIDAZOLAM HCL 2 MG/2ML IJ SOLN
INTRAMUSCULAR | Status: AC
  Filled 2018-07-23: qty 2

## 2018-07-23 NOTE — Procedures (Signed)
R supraclavicular LN core biopsy 18 g times three EBL 0 Comp 0

## 2018-07-23 NOTE — H&P (Signed)
Chief Complaint: Patient was seen in consultation today for lymphadenopathy  Referring Physician(s): Shoemaker,David  Supervising Physician: Marybelle Killings  Patient Status: [facility] - Out-pt  History of Present Illness: [name] [name] is a 71 y.o. male with past medical history of arrhythmia, CAD, CABG in 2009, cardiac cath in 2017 on Brilinta, DM, HTN who presents to radiology today for recent finding of supraclavicular lymphadenopathy.  Patient states this was found incidentally on recent US for carotid bruit.   CT Soft Tissue Neck 06/22/18 showed: 1. The finding on the recent ultrasound corresponds to an enlarged right supraclavicular lymph node that measures up to 3.4 cm. There is no necrosis or abnormal density. There are no other enlarged lymph nodes. Finding remains of uncertain etiology. 2. Bilateral carotid bifurcation atherosclerotic calcification, as quantified on recent ultrasound.  Patient presents in his usual state of health today.  Denies fever, chills, nausea, vomiting, abdominal pain, dysuria.  He does take Brilinta, last dose was 10/1. He has been NPO today.  Past Medical History:  Diagnosis Date  . Arrhythmia    Premature beats  . Atherosclerosis of native arteries of the extremities with intermittent claudication   . Coronary atherosclerosis of artery bypass graft    CABG 2009, LHC 07/2016 restenosis of SVG to OM treated with DES  . Diabetes mellitus ([place])   . Essential hypertension, benign   . Pure hypercholesterolemia     Past Surgical History:  Procedure Laterality Date  . ADENOIDECTOMY    . CARDIAC CATHETERIZATION N/A 07/18/2016   Procedure: Left Heart Cath and Cors/Grafts Angiography;  Surgeon: Burnell Blanks, MD;  Location: [facility];  Service: Cardiovascular;  Laterality: N/A;  . CARDIAC CATHETERIZATION N/A 07/18/2016   Procedure: Coronary Stent Intervention;  Surgeon: Burnell Blanks, MD;  Location: [place] [facility];   Service: Cardiovascular;  Laterality: N/A;  . CHOLECYSTECTOMY    . CORONARY ANGIOPLASTY WITH STENT PLACEMENT    . CORONARY ARTERY BYPASS GRAFT    . INGUINAL HERNIA REPAIR Right   . TONSILLECTOMY      Allergies: Patient has no known allergies.  Medications: Prior to Admission medications   Medication Sig Start Date End Date Taking? Authorizing Provider  amLODipine (NORVASC) 10 MG tablet TAKE 1 TABLET BY MOUTH EVERY DAY Patient taking differently: Take 10 mg by mouth daily.  07/05/18  Yes Eileen Stanford, PA-C  benazepril (LOTENSIN) 40 MG tablet Take 40 mg by mouth daily.  04/30/12  Yes [provider]  BRILINTA 90 MG TABS tablet TAKE 1 TABLET (90 MG TOTAL) BY MOUTH 2 (TWO) TIMES DAILY. 07/31/17  Yes Sherren Mocha, MD  glimepiride (AMARYL) 2 MG tablet Take 3 mg by mouth daily with breakfast.    Yes [provider]  hydrochlorothiazide (HYDRODIURIL) 25 MG tablet TAKE 1 TABLET BY MOUTH EVERY DAY Patient taking differently: Take 25 mg by mouth daily.  07/11/18  Yes Sherren Mocha, MD  isosorbide mononitrate (IMDUR) 30 MG 24 hr tablet TAKE 1 TABLET BY MOUTH DAILY Patient taking differently: Take 30 mg by mouth daily.  07/11/18  Yes Lyda Jester M, PA-C  metFORMIN (GLUCOPHAGE) 500 MG tablet Take 1,000 mg by mouth 2 (two) times daily.  04/23/13  Yes [provider]  metoprolol tartrate (LOPRESSOR) 50 MG tablet TAKE 1.5 TABLETS (75 MG TOTAL) BY MOUTH 2 (TWO) TIMES DAILY. 05/15/18  Yes Sherren Mocha, MD  nitroGLYCERIN (NITROSTAT) 0.4 MG SL tablet Place 0.4 mg under the tongue every 5 (five) minutes as  needed for chest pain.   Yes [provider]  omeprazole (PRILOSEC OTC) 20 MG tablet Take 20 mg by mouth daily as needed (for acid reflux).   Yes [provider]  rosuvastatin (CRESTOR) 20 MG tablet TAKE 1 TABLET (20 MG TOTAL) BY MOUTH DAILY. 05/21/18  Yes Sherren Mocha, MD  insulin NPH Human (NOVOLIN N) 100 UNIT/ML injection Inject 0.1 mLs (10  Units total) into the skin every morning. And syringes 1/day 07/18/18   Renato Shin, MD     Family History  Problem Relation Age of Onset  . Heart disease Mother        No details  . Diabetes Neg Hx     Social History   Socioeconomic History  . Marital status: Married    Spouse name: Not on file  . Number of children: Not on file  . Years of education: Not on file  . Highest education level: Not on file  Occupational History  . Not on file  Social Needs  . Financial resource strain: Not on file  . Food insecurity:    Worry: Not on file    Inability: Not on file  . Transportation needs:    Medical: Not on file    Non-medical: Not on file  Tobacco Use  . Smoking status: Never Smoker  . Smokeless tobacco: Never Used  Substance and Sexual Activity  . Alcohol use: No  . Drug use: No  . Sexual activity: Not on file  Lifestyle  . Physical activity:    Days per week: Not on file    Minutes per session: Not on file  . Stress: Not on file  Relationships  . Social connections:    Talks on phone: Not on file    Gets together: Not on file    Attends religious service: Not on file    Active member of club or organization: Not on file    Attends meetings of clubs or organizations: Not on file    Relationship status: Not on file  Other Topics Concern  . Not on file  Social History Narrative  . Not on file    Review of Systems: A 12 point ROS discussed and pertinent positives are indicated in the HPI above.  All other systems are negative.  Review of Systems  Constitutional: Negative for fatigue and fever.  Respiratory: Negative for cough and shortness of breath.   Cardiovascular: Negative for chest pain.  Gastrointestinal: Negative for abdominal pain.  Musculoskeletal: Negative for back pain.  Psychiatric/Behavioral: Negative for behavioral problems and confusion.    Vital Signs: BP (!) 155/80   Pulse 86   Resp 16   SpO2 97%   Physical Exam  Constitutional: He  appears well-developed. No distress.  Neck: Normal range of motion. Neck supple.  No palpable lymph nodes in right neck, chest  Cardiovascular: Normal rate, regular rhythm and normal heart sounds. Exam reveals no gallop and no friction rub.  No murmur heard. Pulmonary/Chest: Effort normal and breath sounds normal. No respiratory distress.  Lymphadenopathy:    He has no cervical adenopathy.  Skin: He is not diaphoretic.  Nursing note and vitals reviewed.    MD Evaluation Airway: WNL Heart: WNL Abdomen: WNL Chest/ Lungs: WNL ASA  Classification: 2 Mallampati/Airway Score: One   Imaging: No results found.  Labs:  CBC: No results for input(s): WBC, HGB, HCT, PLT in the last 8760 hours.  COAGS: No results for input(s): INR, APTT in the last 8760 hours.  BMP: No results for input(s): NA, K, CL, CO2, GLUCOSE, BUN, CALCIUM, CREATININE, GFRNONAA, GFRAA in the last 8760 hours.  Invalid input(s): CMP  LIVER FUNCTION TESTS: No results for input(s): BILITOT, AST, ALT, ALKPHOS, PROT, ALBUMIN in the last 8760 hours.  TUMOR MARKERS: No results for input(s): AFPTM, CEA, CA199, CHROMGRNA in the last 8760 hours.  Assessment and Plan: Patient with past medical history of CAD, s/p CABG presents with complaint of lymphadenopathy found by recent imaging.  IR consulted for lymph node biopsy at the request of Dr. Wilburn Cornelia. Case reviewed by Dr. Earleen Newport who approves patient for procedure.  Patient presents today in their usual state of health.  He has been NPO and is not currently on blood thinners.  Risks and benefits discussed with the patient including, but not limited to bleeding, infection, damage to adjacent structures or low yield requiring additional tests.  All of the patient's questions were answered, patient is agreeable to proceed. Consent signed and in chart.  Thank you for this interesting consult.  I greatly enjoyed meeting [name] [name] and look forward to participating in  their care.  A copy of this report was sent to the requesting provider on this date.  Electronically Signed: Docia Barrier, PA 07/23/2018, 12:09 PM   I spent a total of  30 Minutes   in face to face in clinical consultation, greater than 50% of which was counseling/coordinating care for lymphadenopathy.

## 2018-07-23 NOTE — Discharge Instructions (Addendum)
Biopsy Discharge Instructions  The procedure you just had is called a biopsy.  You may feel some discomfort after the local anesthetic wears off.  Your discomfort should improve over the next several days.  AFTER YOUR BIOPSY  Rest for the remainder of the day.  Avoid heavy lifting (more than 10 lb/4.5 kg).  If you have been given a general anesthetic or other medications to help you relax, you should not operate machinery, drive or make legal decisions for 24 hours after your procedure.  Additionally, someone must be available to drive you home.  Only take over-the-counter or prescription medicines for pain, discomfort, or fever as directed by your caregiver.  This can make bleeding worse.  You may resume your usual diet after the procedure.  Avoid alcoholic beverages for 24 hours after your procedure.  Keep the skin around your biopsy site clean and dry.  You may shower after 24 hours.  Cleanse and dry the biopsy site completely after you shower.  Avoid baths and swimming for 72 hours.  Complications are very uncommon after this procedure.  Go to the nearest Emergency Department or contact your caregiver if you develop any of the following symptoms:  Worsening pain  Bleeding  Swelling at the biopsy site  Light headedness or dizziness  Shortness of Breath  Fever or chills Redness or increased pain or swelling at the biopsy site   Moderate Conscious Sedation, Adult, Care After These instructions provide you with information about caring for yourself after your procedure. Your health care provider may also give you more specific instructions. Your treatment has been planned according to current medical practices, but problems sometimes occur. Call your health care provider if you have any problems or questions after your procedure. What can I expect after the procedure? After your procedure, it is common:  To feel sleepy for several hours.  To feel clumsy and have poor balance  for several hours.  To have poor judgment for several hours.  To vomit if you eat too soon.  Follow these instructions at home: For at least 24 hours after the procedure:   Do not: ? Participate in activities where you could fall or become injured. ? Drive. ? Use heavy machinery. ? Drink alcohol. ? Take sleeping pills or medicines that cause drowsiness. ? Make important decisions or sign legal documents. ? Take care of children on your own.  Rest. Eating and drinking  Follow the diet recommended by your health care provider.  If you vomit: ? Drink water, juice, or soup when you can drink without vomiting. ? Make sure you have little or no nausea before eating solid foods. General instructions  Have a responsible adult stay with you until you are awake and alert.  Take over-the-counter and prescription medicines only as told by your health care provider.  If you smoke, do not smoke without supervision.  Keep all follow-up visits as told by your health care provider. This is important. Contact a health care provider if:  You keep feeling nauseous or you keep vomiting.  You feel light-headed.  You develop a rash.  You have a fever. Get help right away if:  You have trouble breathing. This information is not intended to replace advice given to you by your health care provider. Make sure you discuss any questions you have with your health care provider. Document Released: 07/24/2013 Document Revised: 03/07/2016 Document Reviewed: 01/23/2016 Elsevier Interactive Patient Education  [employer].

## 2018-08-02 ENCOUNTER — Other Ambulatory Visit: Payer: Self-pay | Admitting: Cardiovascular Disease

## 2018-08-28 ENCOUNTER — Encounter: Payer: Self-pay | Admitting: Endocrinology

## 2018-08-28 ENCOUNTER — Ambulatory Visit (INDEPENDENT_AMBULATORY_CARE_PROVIDER_SITE_OTHER): Payer: Medicare Other | Admitting: Endocrinology

## 2018-08-28 VITALS — BP 150/70 | HR 79 | Ht 69.0 in | Wt 215.6 lb

## 2018-08-28 DIAGNOSIS — E119 Type 2 diabetes mellitus without complications: Secondary | ICD-10-CM | POA: Diagnosis not present

## 2018-08-28 DIAGNOSIS — I6523 Occlusion and stenosis of bilateral carotid arteries: Secondary | ICD-10-CM | POA: Diagnosis not present

## 2018-08-28 LAB — POCT GLYCOSYLATED HEMOGLOBIN (HGB A1C): Hemoglobin A1C: 9.7 % — AB (ref 4.0–5.6)

## 2018-08-28 MED ORDER — INSULIN NPH (HUMAN) (ISOPHANE) 100 UNIT/ML ~~LOC~~ SUSP: 20.0000 [IU] | SUBCUTANEOUS | 11 refills | Status: DC

## 2018-08-28 NOTE — Patient Instructions (Addendum)
check your blood sugar once a day.  vary the time of day when you check, between before the 3 meals, and at bedtime.  also check if you have symptoms of your blood sugar being too high or too low.  please keep a record of the readings and bring it to your next appointment here (or you can bring the meter itself).  You can write it on any piece of paper.  please call us sooner if your blood sugar goes below 70, or if you have a lot of readings over 200.   I have sent a prescription to your pharmacy, to increase the insulin to 20 units each morning. Please continue the same other diabetes medications for now.  On this type of insulin schedule, you should eat meals on a regular schedule.  If a meal is missed or significantly delayed, your blood sugar could go low. Please call or message [place] next week, to tell us how the blood sugar is doing.  Our goals are to improved the sugar overall, and to transition the glimepiride to the insulin.   Please come back for a follow-up appointment in 2 months.

## 2018-08-28 NOTE — Progress Notes (Signed)
Subjective:    Patient ID: [name] [name], male    DOB: 10-11-1946, 72 y.o.   MRN: [id number]  HPI Pt returns for f/u of diabetes mellitus: DM type: Insulin-requiring type 2 Dx'ed: 5361 Complications: CAD Therapy: insulin since 2019 DKA: never Severe hypoglycemia: never Pancreatitis: never Pancreatic imaging: never Other: he declines multiple daily injections; he declines non-insulin rx, and insulin analogs, due to cost.  Interval history: He chose to start insulin, due to cost.  no cbg record, but states fasting cbg's vary from 96-200's.  It is lowest in the afternoon.  pt states he feels well in general.   Past Medical History:  Diagnosis Date  . Arrhythmia    Premature beats  . Atherosclerosis of native arteries of the extremities with intermittent claudication   . Coronary atherosclerosis of artery bypass graft    CABG 2009, LHC 07/2016 restenosis of SVG to OM treated with DES  . Diabetes mellitus ([place])   . Essential hypertension, benign   . Pure hypercholesterolemia     Past Surgical History:  Procedure Laterality Date  . ADENOIDECTOMY    . CARDIAC CATHETERIZATION N/A 07/18/2016   Procedure: Left Heart Cath and Cors/Grafts Angiography;  Surgeon: Burnell Blanks, MD;  Location: [facility];  Service: Cardiovascular;  Laterality: N/A;  . CARDIAC CATHETERIZATION N/A 07/18/2016   Procedure: Coronary Stent Intervention;  Surgeon: Burnell Blanks, MD;  Location: [facility];  Service: Cardiovascular;  Laterality: N/A;  . CHOLECYSTECTOMY    . CORONARY ANGIOPLASTY WITH STENT PLACEMENT    . CORONARY ARTERY BYPASS GRAFT    . INGUINAL HERNIA REPAIR Right   . TONSILLECTOMY      Social History   Socioeconomic History  . Marital status: Married    Spouse name: Not on file  . Number of children: Not on file  . Years of education: Not on file  . Highest education level: Not on file  Occupational History  . Not on file  Social Needs  . Financial  resource strain: Not on file  . Food insecurity:    Worry: Not on file    Inability: Not on file  . Transportation needs:    Medical: Not on file    Non-medical: Not on file  Tobacco Use  . Smoking status: Never Smoker  . Smokeless tobacco: Never Used  Substance and Sexual Activity  . Alcohol use: No  . Drug use: No  . Sexual activity: Not on file  Lifestyle  . Physical activity:    Days per week: Not on file    Minutes per session: Not on file  . Stress: Not on file  Relationships  . Social connections:    Talks on phone: Not on file    Gets together: Not on file    Attends religious service: Not on file    Active member of club or organization: Not on file    Attends meetings of clubs or organizations: Not on file    Relationship status: Not on file  . Intimate partner violence:    Fear of current or ex partner: Not on file    Emotionally abused: Not on file    Physically abused: Not on file    Forced sexual activity: Not on file  Other Topics Concern  . Not on file  Social History Narrative  . Not on file    Current Outpatient Medications on File Prior to Visit  Medication Sig Dispense Refill  . amLODipine (  NORVASC) 10 MG tablet TAKE 1 TABLET BY MOUTH EVERY DAY (Patient taking differently: Take 10 mg by mouth daily. ) 90 tablet 3  . benazepril (LOTENSIN) 40 MG tablet Take 40 mg by mouth daily.     Marland Kitchen BRILINTA 90 MG TABS tablet TAKE 1 TABLET (90 MG TOTAL) BY MOUTH 2 (TWO) TIMES DAILY. 60 tablet 11  . glimepiride (AMARYL) 2 MG tablet Take 3 mg by mouth daily with breakfast.     . hydrochlorothiazide (HYDRODIURIL) 25 MG tablet TAKE 1 TABLET BY MOUTH EVERY DAY (Patient taking differently: Take 25 mg by mouth daily. ) 30 tablet 10  . isosorbide mononitrate (IMDUR) 30 MG 24 hr tablet TAKE 1 TABLET BY MOUTH DAILY (Patient taking differently: Take 30 mg by mouth daily. ) 30 tablet 11  . metFORMIN (GLUCOPHAGE) 500 MG tablet Take 1,000 mg by mouth 2 (two) times daily.     .  metoprolol tartrate (LOPRESSOR) 50 MG tablet TAKE 1.5 TABLETS (75 MG TOTAL) BY MOUTH 2 (TWO) TIMES DAILY. 270 tablet 2  . nitroGLYCERIN (NITROSTAT) 0.4 MG SL tablet Place 0.4 mg under the tongue every 5 (five) minutes as needed for chest pain.    Marland Kitchen omeprazole (PRILOSEC OTC) 20 MG tablet Take 20 mg by mouth daily as needed (for acid reflux).    . rosuvastatin (CRESTOR) 20 MG tablet TAKE 1 TABLET (20 MG TOTAL) BY MOUTH DAILY. 90 tablet 1   No current facility-administered medications on file prior to visit.     No Known Allergies  Family History  Problem Relation Age of Onset  . Heart disease Mother        No details  . Diabetes Neg Hx     BP (!) 150/70 (BP Location: Left Arm, Patient Position: Sitting, Cuff Size: Large)   Pulse 79   Ht 5' 9"  (1.753 m)   Wt 215 lb 9.6 oz (97.8 kg)   SpO2 95%   BMI 31.84 kg/m   Review of Systems He denies hypoglycemia.    Objective:   Physical Exam VITAL SIGNS:  See vs page GENERAL: no distress Pulses: dorsalis pedis intact bilat.   MSK: no deformity of the feet CV: no leg edema Skin:  no ulcer on the feet.  normal color and temp on the feet. Neuro: sensation is intact to touch on the feet.    Lab Results  Component Value Date   HGBA1C 9.7 (A) 08/28/2018   Lab Results  Component Value Date   CREATININE 0.84 07/19/2016   BUN 6 07/19/2016   NA 135 07/19/2016   K 3.7 07/19/2016   CL 106 07/19/2016   CO2 22 07/19/2016      Assessment & Plan:  Insulin-requiring type 2 DM: he needs increased rx  Patient Instructions  check your blood sugar once a day.  vary the time of day when you check, between before the 3 meals, and at bedtime.  also check if you have symptoms of your blood sugar being too high or too low.  please keep a record of the readings and bring it to your next appointment here (or you can bring the meter itself).  You can write it on any piece of paper.  please call us sooner if your blood sugar goes below 70, or if you  have a lot of readings over 200.   I have sent a prescription to your pharmacy, to increase the insulin to 20 units each morning. Please continue the same other diabetes medications for  now.  On this type of insulin schedule, you should eat meals on a regular schedule.  If a meal is missed or significantly delayed, your blood sugar could go low. Please call or message [place] next week, to tell us how the blood sugar is doing.  Our goals are to improved the sugar overall, and to transition the glimepiride to the insulin.   Please come back for a follow-up appointment in 2 months.

## 2018-09-28 DIAGNOSIS — H26491 Other secondary cataract, right eye: Secondary | ICD-10-CM | POA: Diagnosis not present

## 2018-10-04 ENCOUNTER — Other Ambulatory Visit (HOSPITAL_COMMUNITY): Payer: Self-pay | Admitting: Otolaryngology

## 2018-10-05 ENCOUNTER — Other Ambulatory Visit (HOSPITAL_COMMUNITY): Payer: Self-pay | Admitting: Otolaryngology

## 2018-10-05 ENCOUNTER — Other Ambulatory Visit: Payer: Self-pay | Admitting: Otolaryngology

## 2018-10-05 DIAGNOSIS — R221 Localized swelling, mass and lump, neck: Secondary | ICD-10-CM

## 2018-10-25 DIAGNOSIS — Z6831 Body mass index (BMI) 31.0-31.9, adult: Secondary | ICD-10-CM | POA: Diagnosis not present

## 2018-10-25 DIAGNOSIS — E119 Type 2 diabetes mellitus without complications: Secondary | ICD-10-CM | POA: Diagnosis not present

## 2018-10-25 DIAGNOSIS — E669 Obesity, unspecified: Secondary | ICD-10-CM | POA: Diagnosis not present

## 2018-10-25 DIAGNOSIS — R635 Abnormal weight gain: Secondary | ICD-10-CM | POA: Diagnosis not present

## 2018-10-25 DIAGNOSIS — R948 Abnormal results of function studies of other organs and systems: Secondary | ICD-10-CM | POA: Diagnosis not present

## 2018-10-26 ENCOUNTER — Ambulatory Visit (HOSPITAL_COMMUNITY)
Admission: RE | Admit: 2018-10-26 | Discharge: 2018-10-26 | Disposition: A | Discharge status: Home / Self Care | Payer: Medicare Other | Source: Ambulatory Visit | Attending: Otolaryngology | Admitting: Otolaryngology

## 2018-10-26 DIAGNOSIS — R221 Localized swelling, mass and lump, neck: Secondary | ICD-10-CM | POA: Diagnosis not present

## 2018-11-06 DIAGNOSIS — I25810 Atherosclerosis of coronary artery bypass graft(s) without angina pectoris: Secondary | ICD-10-CM | POA: Diagnosis not present

## 2018-11-06 DIAGNOSIS — I739 Peripheral vascular disease, unspecified: Secondary | ICD-10-CM | POA: Diagnosis not present

## 2018-11-06 DIAGNOSIS — I10 Essential (primary) hypertension: Secondary | ICD-10-CM | POA: Diagnosis not present

## 2018-11-06 DIAGNOSIS — E669 Obesity, unspecified: Secondary | ICD-10-CM | POA: Diagnosis not present

## 2018-11-06 DIAGNOSIS — E119 Type 2 diabetes mellitus without complications: Secondary | ICD-10-CM | POA: Diagnosis not present

## 2018-11-06 DIAGNOSIS — Z6831 Body mass index (BMI) 31.0-31.9, adult: Secondary | ICD-10-CM | POA: Diagnosis not present

## 2018-11-06 DIAGNOSIS — Z794 Long term (current) use of insulin: Secondary | ICD-10-CM | POA: Diagnosis not present

## 2018-11-09 DIAGNOSIS — H26492 Other secondary cataract, left eye: Secondary | ICD-10-CM | POA: Diagnosis not present

## 2018-11-10 ENCOUNTER — Other Ambulatory Visit: Payer: Self-pay | Admitting: Cardiovascular Disease

## 2018-11-16 DIAGNOSIS — Z6830 Body mass index (BMI) 30.0-30.9, adult: Secondary | ICD-10-CM | POA: Diagnosis not present

## 2018-11-16 DIAGNOSIS — E119 Type 2 diabetes mellitus without complications: Secondary | ICD-10-CM | POA: Diagnosis not present

## 2018-11-16 DIAGNOSIS — I10 Essential (primary) hypertension: Secondary | ICD-10-CM | POA: Diagnosis not present

## 2018-11-16 DIAGNOSIS — E669 Obesity, unspecified: Secondary | ICD-10-CM | POA: Diagnosis not present

## 2018-11-16 DIAGNOSIS — Z794 Long term (current) use of insulin: Secondary | ICD-10-CM | POA: Diagnosis not present

## 2018-11-23 DIAGNOSIS — E669 Obesity, unspecified: Secondary | ICD-10-CM | POA: Diagnosis not present

## 2018-11-23 DIAGNOSIS — Z6830 Body mass index (BMI) 30.0-30.9, adult: Secondary | ICD-10-CM | POA: Diagnosis not present

## 2018-11-29 DIAGNOSIS — I350 Nonrheumatic aortic (valve) stenosis: Secondary | ICD-10-CM | POA: Diagnosis not present

## 2018-11-29 DIAGNOSIS — I10 Essential (primary) hypertension: Secondary | ICD-10-CM | POA: Diagnosis not present

## 2018-11-29 DIAGNOSIS — E1165 Type 2 diabetes mellitus with hyperglycemia: Secondary | ICD-10-CM | POA: Diagnosis not present

## 2018-11-29 DIAGNOSIS — E7800 Pure hypercholesterolemia, unspecified: Secondary | ICD-10-CM | POA: Diagnosis not present

## 2018-11-29 DIAGNOSIS — I2510 Atherosclerotic heart disease of native coronary artery without angina pectoris: Secondary | ICD-10-CM | POA: Diagnosis not present

## 2018-11-29 DIAGNOSIS — Z794 Long term (current) use of insulin: Secondary | ICD-10-CM | POA: Diagnosis not present

## 2018-11-29 DIAGNOSIS — Z955 Presence of coronary angioplasty implant and graft: Secondary | ICD-10-CM | POA: Diagnosis not present

## 2018-11-29 DIAGNOSIS — I779 Disorder of arteries and arterioles, unspecified: Secondary | ICD-10-CM | POA: Diagnosis not present

## 2018-11-30 DIAGNOSIS — E669 Obesity, unspecified: Secondary | ICD-10-CM | POA: Diagnosis not present

## 2018-11-30 DIAGNOSIS — Z6831 Body mass index (BMI) 31.0-31.9, adult: Secondary | ICD-10-CM | POA: Diagnosis not present

## 2018-12-04 ENCOUNTER — Telehealth: Payer: Self-pay

## 2018-12-04 NOTE — Telephone Encounter (Signed)
Received lab results from [facility]. Per Dr. Loanne Drilling, pt will require appt. Message routed to scheduling coordinator for scheduling purposes. Documents placed in referrals file at nurses desk for future reference and completion.

## 2018-12-05 NOTE — Telephone Encounter (Signed)
As requested patient is scheduled for an appointment on 12/12/18 at 3:15 p.m.

## 2018-12-07 DIAGNOSIS — E669 Obesity, unspecified: Secondary | ICD-10-CM | POA: Diagnosis not present

## 2018-12-07 DIAGNOSIS — E119 Type 2 diabetes mellitus without complications: Secondary | ICD-10-CM | POA: Diagnosis not present

## 2018-12-07 DIAGNOSIS — Z6831 Body mass index (BMI) 31.0-31.9, adult: Secondary | ICD-10-CM | POA: Diagnosis not present

## 2018-12-07 DIAGNOSIS — I10 Essential (primary) hypertension: Secondary | ICD-10-CM | POA: Diagnosis not present

## 2018-12-11 DIAGNOSIS — H35371 Puckering of macula, right eye: Secondary | ICD-10-CM | POA: Diagnosis not present

## 2018-12-12 ENCOUNTER — Other Ambulatory Visit: Payer: Self-pay

## 2018-12-12 ENCOUNTER — Ambulatory Visit (INDEPENDENT_AMBULATORY_CARE_PROVIDER_SITE_OTHER): Payer: Medicare Other | Admitting: Endocrinology

## 2018-12-12 ENCOUNTER — Encounter: Payer: Self-pay | Admitting: Endocrinology

## 2018-12-12 VITALS — BP 124/70 | HR 76 | Ht 69.0 in | Wt 199.8 lb

## 2018-12-12 DIAGNOSIS — E119 Type 2 diabetes mellitus without complications: Secondary | ICD-10-CM

## 2018-12-12 LAB — POCT GLYCOSYLATED HEMOGLOBIN (HGB A1C): HEMOGLOBIN A1C: 7.4 % — AB (ref 4.0–5.6)

## 2018-12-12 MED ORDER — GLIMEPIRIDE 1 MG PO TABS: 1.0000 mg | ORAL_TABLET | Freq: Every day | ORAL | 11 refills | Status: DC

## 2018-12-12 NOTE — Patient Instructions (Signed)
Please continue the same insulin, and: Reduce the glimepiride to 1 mg each morning Please call or message [place] next week, to tell us how the blood sugar is doing. check your blood sugar twice a day.  vary the time of day when you check, between before the 3 meals, and at bedtime.  also check if you have symptoms of your blood sugar being too high or too low.  please keep a record of the readings and bring it to your next appointment here (or you can bring the meter itself).  You can write it on any piece of paper.  please call us sooner if your blood sugar goes below 70, or if you have a lot of readings over 200. Please come back for a follow-up appointment in 2 months.

## 2018-12-12 NOTE — Progress Notes (Signed)
Subjective:    Patient ID: [name] [name], male    DOB: 1946/09/06, 73 y.o.   MRN: [id number]  HPI Pt returns for f/u of diabetes mellitus: DM type: Insulin-requiring type 2 Dx'ed: 0240 Complications: CAD Therapy: insulin since 2019 DKA: never Severe hypoglycemia: never Pancreatitis: never Pancreatic imaging: never Other: he declines multiple daily injections; he declines non-insulin rx, and insulin analogs, due to cost; he chose to start insulin, due to cost, rather than continue multiple oral rxs. Interval history:  no cbg record, but states fasting cbg's vary from 70-200's.  It is lowest in the afternoon.  pt states he feels well in general.  He has lost weight, due to his efforts.   Past Medical History:  Diagnosis Date  . Arrhythmia    Premature beats  . Atherosclerosis of native arteries of the extremities with intermittent claudication   . Coronary atherosclerosis of artery bypass graft    CABG 2009, LHC 07/2016 restenosis of SVG to OM treated with DES  . Diabetes mellitus ([place])   . Essential hypertension, benign   . Pure hypercholesterolemia     Past Surgical History:  Procedure Laterality Date  . ADENOIDECTOMY    . CARDIAC CATHETERIZATION N/A 07/18/2016   Procedure: Left Heart Cath and Cors/Grafts Angiography;  Surgeon: Burnell Blanks, MD;  Location: [facility];  Service: Cardiovascular;  Laterality: N/A;  . CARDIAC CATHETERIZATION N/A 07/18/2016   Procedure: Coronary Stent Intervention;  Surgeon: Burnell Blanks, MD;  Location: [facility];  Service: Cardiovascular;  Laterality: N/A;  . CHOLECYSTECTOMY    . CORONARY ANGIOPLASTY WITH STENT PLACEMENT    . CORONARY ARTERY BYPASS GRAFT    . INGUINAL HERNIA REPAIR Right   . TONSILLECTOMY      Social History   Socioeconomic History  . Marital status: Married    Spouse name: Not on file  . Number of children: Not on file  . Years of education: Not on file  . Highest education level: Not  on file  Occupational History  . Not on file  Social Needs  . Financial resource strain: Not on file  . Food insecurity:    Worry: Not on file    Inability: Not on file  . Transportation needs:    Medical: Not on file    Non-medical: Not on file  Tobacco Use  . Smoking status: Never Smoker  . Smokeless tobacco: Never Used  Substance and Sexual Activity  . Alcohol use: No  . Drug use: No  . Sexual activity: Not on file  Lifestyle  . Physical activity:    Days per week: Not on file    Minutes per session: Not on file  . Stress: Not on file  Relationships  . Social connections:    Talks on phone: Not on file    Gets together: Not on file    Attends religious service: Not on file    Active member of club or organization: Not on file    Attends meetings of clubs or organizations: Not on file    Relationship status: Not on file  . Intimate partner violence:    Fear of current or ex partner: Not on file    Emotionally abused: Not on file    Physically abused: Not on file    Forced sexual activity: Not on file  Other Topics Concern  . Not on file  Social History Narrative  . Not on file    Current Outpatient  Medications on File Prior to Visit  Medication Sig Dispense Refill  . amLODipine (NORVASC) 10 MG tablet TAKE 1 TABLET BY MOUTH EVERY DAY (Patient taking differently: Take 10 mg by mouth daily. ) 90 tablet 3  . benazepril (LOTENSIN) 40 MG tablet Take 40 mg by mouth daily.     Marland Kitchen BRILINTA 90 MG TABS tablet TAKE 1 TABLET (90 MG TOTAL) BY MOUTH 2 (TWO) TIMES DAILY. 60 tablet 11  . hydrochlorothiazide (HYDRODIURIL) 25 MG tablet TAKE 1 TABLET BY MOUTH EVERY DAY (Patient taking differently: Take 25 mg by mouth daily. ) 30 tablet 10  . insulin NPH Human (NOVOLIN N) 100 UNIT/ML injection Inject 0.2 mLs (20 Units total) into the skin every morning. And syringes 1/day (Patient taking differently: Inject 20 Units into the skin every morning. ) 10 mL 11  . isosorbide mononitrate  (IMDUR) 30 MG 24 hr tablet TAKE 1 TABLET BY MOUTH DAILY (Patient taking differently: Take 30 mg by mouth daily. ) 30 tablet 11  . metFORMIN (GLUCOPHAGE) 500 MG tablet Take 1,000 mg by mouth 2 (two) times daily.     . metoprolol tartrate (LOPRESSOR) 50 MG tablet TAKE 1.5 TABLETS (75 MG TOTAL) BY MOUTH 2 (TWO) TIMES DAILY. 270 tablet 2  . nitroGLYCERIN (NITROSTAT) 0.4 MG SL tablet Place 0.4 mg under the tongue every 5 (five) minutes as needed for chest pain.    Marland Kitchen omeprazole (PRILOSEC OTC) 20 MG tablet Take 20 mg by mouth daily as needed (for acid reflux).    . rosuvastatin (CRESTOR) 20 MG tablet TAKE 1 TABLET (20 MG TOTAL) BY MOUTH DAILY. 90 tablet 2   No current facility-administered medications on file prior to visit.     No Known Allergies  Family History  Problem Relation Age of Onset  . Heart disease Mother        No details  . Diabetes Neg Hx     BP 124/70 (BP Location: Right Arm, Patient Position: Sitting, Cuff Size: Normal)   Pulse 76   Ht 5' 9"  (1.753 m)   Wt 199 lb 12.8 oz (90.6 kg)   SpO2 92%   BMI 29.51 kg/m    Review of Systems He denies hypoglycemia    Objective:   Physical Exam VITAL SIGNS:  See vs page GENERAL: no distress Pulses: dorsalis pedis intact bilat.   MSK: no deformity of the feet CV: no leg edema Skin:  no ulcer on the feet.  normal color and temp on the feet. Neuro: sensation is intact to touch on the feet  outside test results are reviewed: Lab Results  Component Value Date   HGBA1C 7.4 (A) 12/12/2018    Lab Results  Component Value Date   CREATININE 0.84 07/19/2016   BUN 6 07/19/2016   NA 135 07/19/2016   K 3.7 07/19/2016   CL 106 07/19/2016   CO2 22 07/19/2016      Assessment & Plan:  Insulin-requiring type 2 DM: with CAD: this is the best control this pt should aim for, given this regimen, which does match insulin to his changing needs throughout the day.  Elderly state: goal is to taper off glimepiride.    Patient  Instructions  Please continue the same insulin, and: Reduce the glimepiride to 1 mg each morning Please call or message [place] next week, to tell us how the blood sugar is doing. check your blood sugar twice a day.  vary the time of day when you check, between before the 3 meals,  and at bedtime.  also check if you have symptoms of your blood sugar being too high or too low.  please keep a record of the readings and bring it to your next appointment here (or you can bring the meter itself).  You can write it on any piece of paper.  please call us sooner if your blood sugar goes below 70, or if you have a lot of readings over 200. Please come back for a follow-up appointment in 2 months.

## 2018-12-21 DIAGNOSIS — E669 Obesity, unspecified: Secondary | ICD-10-CM | POA: Diagnosis not present

## 2018-12-21 DIAGNOSIS — Z6831 Body mass index (BMI) 31.0-31.9, adult: Secondary | ICD-10-CM | POA: Diagnosis not present

## 2018-12-31 DIAGNOSIS — Z6831 Body mass index (BMI) 31.0-31.9, adult: Secondary | ICD-10-CM | POA: Diagnosis not present

## 2018-12-31 DIAGNOSIS — Z794 Long term (current) use of insulin: Secondary | ICD-10-CM | POA: Diagnosis not present

## 2018-12-31 DIAGNOSIS — E119 Type 2 diabetes mellitus without complications: Secondary | ICD-10-CM | POA: Diagnosis not present

## 2018-12-31 DIAGNOSIS — E669 Obesity, unspecified: Secondary | ICD-10-CM | POA: Diagnosis not present

## 2019-01-07 ENCOUNTER — Telehealth: Payer: Self-pay | Admitting: Cardiovascular Disease

## 2019-01-07 MED ORDER — NITROGLYCERIN 0.4 MG SL SUBL: 0.4000 mg | SUBLINGUAL_TABLET | SUBLINGUAL | 3 refills | Status: DC | PRN

## 2019-01-07 NOTE — Telephone Encounter (Signed)
Spoke with pt, c/o exertional chest pain 3/10 lasting 2 minutes. Resolves either with slowing down or using SL nitro. Pt states he gets up at night out of bed to urinate and gets back in bed to experience cp 3/10 describing it as chest heaviness that goes into left arm/ aching . SX come and go, SX started 1 week ago.  I informed pt I will call him back with further advise. Pt is not currently having cp.

## 2019-01-07 NOTE — Telephone Encounter (Signed)
Scheduled patient this Friday for LHC with Dr. [name]. Reviewed instructions and letter released to [place]. He understands to call with any questions or concerns.

## 2019-01-07 NOTE — Telephone Encounter (Signed)
  Pt c/o of Chest Pain: STAT if CP now or developed within 24 hours  1. Are you having CP right now? Not right now, it is exercise induced  2. Are you experiencing any other symptoms (ex. SOB, nausea, vomiting, sweating)? no  3. How long have you been experiencing CP? About a week  4. Is your CP continuous or coming and going? Comes and goes on exertion  5. Have you taken Nitroglycerin? Yes, may need a refill  Patient has hx of bypass surgery and two stents ?

## 2019-01-07 NOTE — Telephone Encounter (Signed)
I called the patient and had a lengthy telephone discussion with him.  Normally I would bring him in for an office visit, but with the current Covid-19 situation, we were able to determine a care plan over the telephone.  The patient has coronary artery disease with previous CABG and has undergone multiple PCI procedures to treat severe saphenous vein graft stenoses.  His last PCI procedure was in 2017.  I have seen him routinely in follow-up every 6 to 12 months and he has done well without symptoms of angina.  However, he reports over the last few weeks developing recurrent symptoms of exertional angina.  His symptoms are highly typical and he is experienced substernal chest discomfort with both low-level activity and sometimes occurring at nighttime when he is getting up from sleep.  Symptoms have been self-limited and resolve with rest.  He has taken nitroglycerin once and and his chest pain resolved after taking it.  He has had no severe unrelenting pain.  The patient has been managed with aggressive medical therapy.  He is on 3 antianginal drugs and has been continued on long-term ticagrelor.  Considering his highly typical symptoms of unstable angina on a background of aggressive antianginal therapy, I have recommended that we proceed with cardiac catheterization and possible PCI.  I offered the patient an urgent cath this week, but he prefers to wait until I am in the Cath Lab.  We will tentatively schedule him for this Friday which is my next available time in the cardiac catheterization lab.  He understands that if symptoms progress or he develops worsening chest pain, he needs to call 911 and emergently seek medical attention.  In the meantime, I have recommended that he start aspirin 81 mg daily in addition to his ticagrelor.  I have also recommended that he increase isosorbide to 60 mg daily.  The patient understands that he will be contacted for precatheterization instructions.  I will do an  H&P when he arrives to the short stay.  All of his questions are answered.

## 2019-01-08 ENCOUNTER — Encounter (HOSPITAL_COMMUNITY): Payer: Medicare Other

## 2019-01-08 ENCOUNTER — Ambulatory Visit: Payer: Medicare Other | Admitting: Vascular Surgery

## 2019-01-10 ENCOUNTER — Telehealth: Payer: Self-pay | Admitting: *Deleted

## 2019-01-10 NOTE — Telephone Encounter (Addendum)
Pt contacted pre-catheterization scheduled at [facility] for: Friday January 11, 2019 9 AM Verified arrival time and place: [facility] at: 7AM  No solid food after midnight prior to cath, clear liquids until 5 AM day of procedure. Hold: Metformin -day of procedure and 48 hours post procedure. Glimepiride-AM of procedure. Insulin-AM of procedure. HCTZ-AM of procedure.  Except hold medications AM meds can be  taken pre-cath with sip of water including: ASA 81 mg Brilinta 68m  Confirmed patient has responsible person to drive home post procedure and observe 24 hours after arriving home: yes  Per current [place] visitor restrictions: [facility] aware [facility] his ride home will be contacted with time to pick him up.       Cardiac Questionnaire:  ____________   [id number]-Screening Questions:  . Do you currently have a fever? No . Have you recently travelled on a cruise, internationally, or to [place] No . Have you been in contact with someone that is currently pending confirmation of Covid19 testing or has been confirmed to have the [place]?  No . Are you currently experiencing fatigue or cough? No . Are you currently experiencing new or worsening shortness of breath at rest or with minimal activity? Shortness of breath reason for cardiac cath per pt. . Have you been in contact with someone that was recently sick with fever/cough/fatigue?No

## 2019-01-11 ENCOUNTER — Encounter (HOSPITAL_COMMUNITY)
Admission: RE | Disposition: A | Discharge status: Home / Self Care | Payer: Self-pay | Source: Home / Self Care | Attending: Cardiovascular Disease

## 2019-01-11 ENCOUNTER — Ambulatory Visit (HOSPITAL_COMMUNITY)
Admission: RE | Admit: 2019-01-11 | Discharge: 2019-01-11 | Disposition: A | Discharge status: Home / Self Care | Payer: Medicare Other | Attending: Cardiovascular Disease | Admitting: Cardiovascular Disease

## 2019-01-11 ENCOUNTER — Other Ambulatory Visit: Payer: Self-pay

## 2019-01-11 ENCOUNTER — Telehealth: Payer: Self-pay

## 2019-01-11 DIAGNOSIS — Z955 Presence of coronary angioplasty implant and graft: Secondary | ICD-10-CM | POA: Diagnosis not present

## 2019-01-11 DIAGNOSIS — Z79899 Other long term (current) drug therapy: Secondary | ICD-10-CM | POA: Diagnosis not present

## 2019-01-11 DIAGNOSIS — I25110 Atherosclerotic heart disease of native coronary artery with unstable angina pectoris: Secondary | ICD-10-CM | POA: Diagnosis not present

## 2019-01-11 DIAGNOSIS — I2582 Chronic total occlusion of coronary artery: Secondary | ICD-10-CM | POA: Insufficient documentation

## 2019-01-11 DIAGNOSIS — I10 Essential (primary) hypertension: Secondary | ICD-10-CM | POA: Diagnosis not present

## 2019-01-11 DIAGNOSIS — I25710 Atherosclerosis of autologous vein coronary artery bypass graft(s) with unstable angina pectoris: Secondary | ICD-10-CM

## 2019-01-11 DIAGNOSIS — E7800 Pure hypercholesterolemia, unspecified: Secondary | ICD-10-CM | POA: Diagnosis not present

## 2019-01-11 DIAGNOSIS — Z7984 Long term (current) use of oral hypoglycemic drugs: Secondary | ICD-10-CM | POA: Insufficient documentation

## 2019-01-11 DIAGNOSIS — E119 Type 2 diabetes mellitus without complications: Secondary | ICD-10-CM | POA: Diagnosis not present

## 2019-01-11 DIAGNOSIS — I200 Unstable angina: Secondary | ICD-10-CM | POA: Diagnosis present

## 2019-01-11 DIAGNOSIS — Z951 Presence of aortocoronary bypass graft: Secondary | ICD-10-CM | POA: Insufficient documentation

## 2019-01-11 DIAGNOSIS — Z7982 Long term (current) use of aspirin: Secondary | ICD-10-CM | POA: Insufficient documentation

## 2019-01-11 HISTORY — PX: CORONARY STENT INTERVENTION: CATH118234

## 2019-01-11 HISTORY — PX: LEFT HEART CATH AND CORS/GRAFTS ANGIOGRAPHY: CATH118250

## 2019-01-11 LAB — BASIC METABOLIC PANEL
Anion gap: 11 (ref 5–15)
BUN: 15 mg/dL (ref 8–23)
CHLORIDE: 103 mmol/L (ref 98–111)
CO2: 23 mmol/L (ref 22–32)
Calcium: 9.5 mg/dL (ref 8.9–10.3)
Creatinine, Ser: 1.02 mg/dL (ref 0.61–1.24)
Glucose, Bld: 182 mg/dL — ABNORMAL HIGH (ref 70–99)
POTASSIUM: 4 mmol/L (ref 3.5–5.1)
SODIUM: 137 mmol/L (ref 135–145)

## 2019-01-11 LAB — CBC
HCT: 41.9 % (ref 39.0–52.0)
Hemoglobin: 13.5 g/dL (ref 13.0–17.0)
MCH: 28.1 pg (ref 26.0–34.0)
MCHC: 32.2 g/dL (ref 30.0–36.0)
MCV: 87.1 fL (ref 80.0–100.0)
PLATELETS: 297 10*3/uL (ref 150–400)
RBC: 4.81 MIL/uL (ref 4.22–5.81)
RDW: 14 % (ref 11.5–15.5)
WBC: 11.6 10*3/uL — ABNORMAL HIGH (ref 4.0–10.5)
nRBC: 0 % (ref 0.0–0.2)

## 2019-01-11 LAB — GLUCOSE, CAPILLARY
GLUCOSE-CAPILLARY: 169 mg/dL — AB (ref 70–99)
Glucose-Capillary: 121 mg/dL — ABNORMAL HIGH (ref 70–99)

## 2019-01-11 LAB — POCT ACTIVATED CLOTTING TIME: ACTIVATED CLOTTING TIME: 312 s

## 2019-01-11 SURGERY — LEFT HEART CATH AND CORS/GRAFTS ANGIOGRAPHY
Anesthesia: LOCAL

## 2019-01-11 MED ORDER — HEPARIN (PORCINE) IN NACL 1000-0.9 UT/500ML-% IV SOLN
INTRAVENOUS | Status: AC
  Filled 2019-01-11: qty 1000

## 2019-01-11 MED ORDER — SODIUM CHLORIDE 0.9% FLUSH: 3.0000 mL | INTRAVENOUS | Status: DC | PRN

## 2019-01-11 MED ORDER — IOHEXOL 350 MG/ML SOLN
INTRAVENOUS | Status: DC | PRN
  Administered 2019-01-11: 165 mL via INTRAVENOUS

## 2019-01-11 MED ORDER — HYDRALAZINE HCL 20 MG/ML IJ SOLN: 10.0000 mg | INTRAMUSCULAR | Status: DC | PRN

## 2019-01-11 MED ORDER — VERAPAMIL HCL 2.5 MG/ML IV SOLN
INTRAVENOUS | Status: DC | PRN
  Administered 2019-01-11: 11:00:00 via INTRA_ARTERIAL

## 2019-01-11 MED ORDER — ASPIRIN 81 MG PO CHEW: 81.0000 mg | CHEWABLE_TABLET | ORAL | Status: DC

## 2019-01-11 MED ORDER — SODIUM CHLORIDE 0.9 % WEIGHT BASED INFUSION
3.0000 mL/kg/h | INTRAVENOUS | Status: AC
  Administered 2019-01-11: 3 mL/kg/h via INTRAVENOUS

## 2019-01-11 MED ORDER — SODIUM CHLORIDE 0.9 % IV SOLN: 250.0000 mL | INTRAVENOUS | Status: DC | PRN

## 2019-01-11 MED ORDER — SODIUM CHLORIDE 0.9 % WEIGHT BASED INFUSION: 1.0000 mL/kg/h | INTRAVENOUS | Status: DC

## 2019-01-11 MED ORDER — SODIUM CHLORIDE 0.9% FLUSH: 3.0000 mL | Freq: Two times a day (BID) | INTRAVENOUS | Status: DC

## 2019-01-11 MED ORDER — NITROGLYCERIN 1 MG/10 ML FOR IR/CATH LAB
INTRA_ARTERIAL | Status: DC | PRN
  Administered 2019-01-11: 200 ug
  Administered 2019-01-11: 200 ug via INTRACORONARY

## 2019-01-11 MED ORDER — NITROGLYCERIN 1 MG/10 ML FOR IR/CATH LAB
INTRA_ARTERIAL | Status: AC
  Filled 2019-01-11: qty 10

## 2019-01-11 MED ORDER — LIDOCAINE HCL (PF) 1 % IJ SOLN
INTRAMUSCULAR | Status: AC
  Filled 2019-01-11: qty 30

## 2019-01-11 MED ORDER — ASPIRIN EC 81 MG PO TBEC: 81.0000 mg | DELAYED_RELEASE_TABLET | Freq: Every day | ORAL | 2 refills | Status: AC

## 2019-01-11 MED ORDER — HEPARIN SODIUM (PORCINE) 1000 UNIT/ML IJ SOLN
INTRAMUSCULAR | Status: AC
  Filled 2019-01-11: qty 1

## 2019-01-11 MED ORDER — HEPARIN SODIUM (PORCINE) 1000 UNIT/ML IJ SOLN
INTRAMUSCULAR | Status: DC | PRN
  Administered 2019-01-11 (×2): 5000 [IU] via INTRAVENOUS

## 2019-01-11 MED ORDER — LABETALOL HCL 5 MG/ML IV SOLN: 10.0000 mg | INTRAVENOUS | Status: DC | PRN

## 2019-01-11 MED ORDER — FENTANYL CITRATE (PF) 100 MCG/2ML IJ SOLN
INTRAMUSCULAR | Status: DC | PRN
  Administered 2019-01-11: 25 ug via INTRAVENOUS

## 2019-01-11 MED ORDER — VERAPAMIL HCL 2.5 MG/ML IV SOLN
INTRAVENOUS | Status: AC
  Filled 2019-01-11: qty 2

## 2019-01-11 MED ORDER — ONDANSETRON HCL 4 MG/2ML IJ SOLN: 4.0000 mg | Freq: Four times a day (QID) | INTRAMUSCULAR | Status: DC | PRN

## 2019-01-11 MED ORDER — ISOSORBIDE MONONITRATE ER 60 MG PO TB24: 60.0000 mg | ORAL_TABLET | Freq: Every day | ORAL | 3 refills | Status: DC

## 2019-01-11 MED ORDER — FENTANYL CITRATE (PF) 100 MCG/2ML IJ SOLN
INTRAMUSCULAR | Status: AC
  Filled 2019-01-11: qty 2

## 2019-01-11 MED ORDER — TICAGRELOR 90 MG PO TABS: 90.0000 mg | ORAL_TABLET | ORAL | Status: DC

## 2019-01-11 MED ORDER — HEPARIN (PORCINE) IN NACL 1000-0.9 UT/500ML-% IV SOLN
INTRAVENOUS | Status: DC | PRN
  Administered 2019-01-11 (×3): 500 mL

## 2019-01-11 MED ORDER — MIDAZOLAM HCL 2 MG/2ML IJ SOLN
INTRAMUSCULAR | Status: DC | PRN
  Administered 2019-01-11: 2 mg via INTRAVENOUS

## 2019-01-11 MED ORDER — LIDOCAINE HCL (PF) 1 % IJ SOLN
INTRAMUSCULAR | Status: DC | PRN
  Administered 2019-01-11: 2 mL

## 2019-01-11 MED ORDER — MIDAZOLAM HCL 2 MG/2ML IJ SOLN
INTRAMUSCULAR | Status: AC
  Filled 2019-01-11: qty 2

## 2019-01-11 MED ORDER — ACETAMINOPHEN 325 MG PO TABS: 650.0000 mg | ORAL_TABLET | ORAL | Status: DC | PRN

## 2019-01-11 SURGICAL SUPPLY — 20 items
BALLN EMERGE MR 2.0X12 (BALLOONS) ×2
BALLN ~~LOC~~ EMERGE MR 2.5X12 (BALLOONS) ×2
BALLOON EMERGE MR 2.0X12 (BALLOONS) ×1 IMPLANT
BALLOON ~~LOC~~ EMERGE MR 2.5X12 (BALLOONS) ×1 IMPLANT
CATH EXPO 5F MPA-1 (CATHETERS) ×2 IMPLANT
CATH INFINITI 5 FR IM (CATHETERS) ×2 IMPLANT
CATH INFINITI 5FR MULTPACK ANG (CATHETERS) ×2 IMPLANT
CATH VISTA GUIDE 6FR MPA1 (CATHETERS) ×2 IMPLANT
DEVICE RAD COMP TR BAND LRG (VASCULAR PRODUCTS) ×2 IMPLANT
GLIDESHEATH SLEND SS 6F .021 (SHEATH) ×2 IMPLANT
GUIDEWIRE INQWIRE 1.5J.035X260 (WIRE) ×1 IMPLANT
INQWIRE 1.5J .035X260CM (WIRE) ×2
KIT ENCORE 26 ADVANTAGE (KITS) ×2 IMPLANT
KIT HEART LEFT (KITS) ×2 IMPLANT
KIT HEMO VALVE WATCHDOG (MISCELLANEOUS) ×2 IMPLANT
PACK CARDIAC CATHETERIZATION (CUSTOM PROCEDURE TRAY) ×2 IMPLANT
STENT SYNERGY DES 2.25X16 (Permanent Stent) ×2 IMPLANT
TRANSDUCER W/STOPCOCK (MISCELLANEOUS) ×2 IMPLANT
TUBING CIL FLEX 10 FLL-RA (TUBING) ×2 IMPLANT
WIRE COUGAR XT STRL 190CM (WIRE) ×2 IMPLANT

## 2019-01-11 NOTE — Telephone Encounter (Signed)
Per Dr. [name], new Rx for Imdur 60 mg qd called in.  In one month, the patient will need to STOP BRILINTA and START PLAVIX 75 mg daily with 300 mg loading dose.   Will call the patient in a couple weeks to review.

## 2019-01-11 NOTE — Progress Notes (Signed)
[id number] Pt has been on brilinta and familiar with how to take it and the importance. Reviewed NTG use, ex ed and gave heart healthy and diabetic diets. [facility] and has been seeing a dietitian along with other staff. Has gotten to his goal.. Pt has attended CRP 2 before. Referred to [place] CRP 2. Pt doubtful he will attend again. Graylon Good RN BSN 01/11/2019 3:05 PM

## 2019-01-11 NOTE — Discharge Instructions (Signed)
Drink plenty of fluids over next 48 hours and keep left wrist elevated at heart level for 24 hours  Radial Site Care  This sheet gives you information about how to care for yourself after your procedure. Your health care provider may also give you more specific instructions. If you have problems or questions, contact your health care provider. What can I expect after the procedure? After the procedure, it is common to have:  Bruising and tenderness at the catheter insertion area. Follow these instructions at home: Medicines  Take over-the-counter and prescription medicines only as told by your health care provider. Insertion site care  Follow instructions from your health care provider about how to take care of your insertion site. Make sure you: ? Wash your hands with soap and water before you change your bandage (dressing). If soap and water are not available, use hand sanitizer. ? Remove your dressing as told by your health care provider. In 24-48 hours  Check your insertion site every day for signs of infection. Check for: ? Redness, swelling, or pain. ? Fluid or blood. ? Pus or a bad smell. ? Warmth.  Do not take baths, swim, or use a hot tub until your health care provider approves.  You may shower 24-48 hours after the procedure, or as directed by your health care provider. ? Remove the dressing and gently wash the site with plain soap and water. ? Pat the area dry with a clean towel. ? Do not rub the site. That could cause bleeding.  Do not apply powder or lotion to the site. Activity   For 24 hours after the procedure, or as directed by your health care provider: ? Do not flex or bend the affected arm. ? Do not push or pull heavy objects with the affected arm. ? Do not drive yourself home from the hospital or clinic. You may drive 24 hours after the procedure unless your health care provider tells you not to. ? Do not operate machinery or power tools.  Do not lift  anything that is heavier than 5 lb, or the limit that you are told, until your health care provider says that it is safe. For 5 days  Ask your health care provider when it is okay to: ? Return to work or school. ? Resume usual physical activities or sports. ? Resume sexual activity. General instructions  If the catheter site starts to bleed, raise your arm and put firm pressure on the site. If the bleeding does not stop, get help right away. This is a medical emergency.  If you went home on the same day as your procedure, a responsible adult should be with you for the first 24 hours after you arrive home.  Keep all follow-up visits as told by your health care provider. This is important. Contact a health care provider if:  You have a fever.  You have redness, swelling, or yellow drainage around your insertion site. Get help right away if:  You have unusual pain at the radial site.  The catheter insertion area swells very fast.  The insertion area is bleeding, and the bleeding does not stop when you hold steady pressure on the area.  Your arm or hand becomes pale, cool, tingly, or numb. These symptoms may represent a serious problem that is an emergency. Do not wait to see if the symptoms will go away. Get medical help right away. Call your local emergency services (911 in the [place] Do not drive yourself  to the hospital. Summary  After the procedure, it is common to have bruising and tenderness at the site.  Follow instructions from your health care provider about how to take care of your radial site wound. Check the wound every day for signs of infection.  Do not lift anything that is heavier than 10 lb (4.5 kg), or the limit that you are told, until your health care provider says that it is safe. This information is not intended to replace advice given to you by your health care provider. Make sure you discuss any questions you have with your health care provider. Document  Released: 11/05/2010 Document Revised: 11/08/2017 Document Reviewed: 11/08/2017 Elsevier Interactive Patient Education  2019 [facility] TEAM HAS ARRANGED FOR AN E-VISIT FOR YOUR APPOINTMENT - PLEASE REVIEW IMPORTANT INFORMATION BELOW SEVERAL DAYS PRIOR TO YOUR APPOINTMENT  Due to the recent COVID-19 pandemic, we are transitioning in-person office visits to tele-medicine visits in an effort to decrease unnecessary exposure to our patients and staff. Medicare and most insurances are covering these visits without a copay needed. You will need a smartphone if possible. For patients that do not have these items, we can still complete the visit using a telephone but do prefer a smartphone to enable video when possible. You may have a close family member that can help. If possible, we also ask that you have a blood pressure cuff and scale at home to measure your blood pressure, heart rate and weight prior to your scheduled appointment. Patients with clinical needs that need an in-person evaluation and testing will still be able to come to the office if absolutely necessary. If you have any questions, feel free to call our office.     DOWNLOADING [place], go to App Store and type in WebEx in the search bar. [place] [employer], the blue/green circle. The app is free but as with any other app download, your phone may require you to verify saved payment information or Apple password. You do NOT have to create a WebEx account.  - If Android, go to [employer] and type in [employer] in the search bar. [place] [employer], the blue/green circle. The app is free but as with any other app download, your phone may require you to verify saved payment information or Android password. You do NOT have to create a WebEx account.  It is very helpful to have this downloaded before your visit.    2-3 DAYS BEFORE YOUR APPOINTMENT  You will  receive a telephone call from one of our [facility] team members - your caller ID may say "Unknown caller." If this is a video visit, we will confirm that you have been able to download the WebEx app. We will remind you check your blood pressure, heart rate and weight prior to your scheduled appointment. If you have an Apple Watch or Kardia, please upload any pertinent ECG strips the day before or morning of your appointment to [facility]. Our staff will also make sure you have reviewed the consent and agree to move forward with your scheduled tele-health visit.     THE DAY OF YOUR APPOINTMENT  Approximately 15 minutes prior to your scheduled appointment, you will receive a telephone call from one of [facility] team - your caller ID may say "Unknown caller."  Our staff will confirm medications, vital signs for the day and any symptoms you may be experiencing. Please have this  information available prior to the time of visit start. It may also be helpful for you to have a pad of paper and pen handy for any instructions given during your visit. They will also walk you through joining the WebEx smartphone meeting if this is a video visit.    CONSENT FOR TELE-HEALTH VISIT - PLEASE RVIEW  I hereby voluntarily request, consent and authorize [facility] and its employed or contracted physicians, physician assistants, nurse practitioners or other licensed health care professionals (the Practitioner), to provide me with telemedicine health care services (the Services") as deemed necessary by the treating Practitioner. I acknowledge and consent to receive the Services by the Practitioner via telemedicine. I understand that the telemedicine visit will involve communicating with the Practitioner through live audiovisual communication technology and the disclosure of certain medical information by electronic transmission. I acknowledge that I have been given the opportunity to request an in-person assessment or other  available alternative prior to the telemedicine visit and am voluntarily participating in the telemedicine visit.  I understand that I have the right to withhold or withdraw my consent to the use of telemedicine in the course of my care at any time, without affecting my right to future care or treatment, and that the Practitioner or I may terminate the telemedicine visit at any time. I understand that I have the right to inspect all information obtained and/or recorded in the course of the telemedicine visit and may receive copies of available information for a reasonable fee.  I understand that some of the potential risks of receiving the Services via telemedicine include:   Delay or interruption in medical evaluation due to technological equipment failure or disruption;  Information transmitted may not be sufficient (e.g. poor resolution of images) to allow for appropriate medical decision making by the Practitioner; and/or   In rare instances, security protocols could fail, causing a breach of personal health information.  Furthermore, I acknowledge that it is my responsibility to provide information about my medical history, conditions and care that is complete and accurate to the best of my ability. I acknowledge that Practitioner's advice, recommendations, and/or decision may be based on factors not within their control, such as incomplete or inaccurate data provided by me or distortions of diagnostic images or specimens that may result from electronic transmissions. I understand that the practice of medicine is not an exact science and that Practitioner makes no warranties or guarantees regarding treatment outcomes. I acknowledge that I will receive a copy of this consent concurrently upon execution via email to the email address I last provided but may also request a printed copy by calling the office of [facility].    I understand that my insurance will be billed for this visit.   I have  read or had this consent read to me.  I understand the contents of this consent, which adequately explains the benefits and risks of the Services being provided via telemedicine.   I have been provided ample opportunity to ask questions regarding this consent and the Services and have had my questions answered to my satisfaction.  I give my informed consent for the services to be provided through the use of telemedicine in my medical care  By participating in this telemedicine visit I agree to the above.

## 2019-01-11 NOTE — Interval H&P Note (Signed)
Cath Lab Visit (complete for each Cath Lab visit)  Clinical Evaluation Leading to the Procedure:   ACS: No.  Non-ACS:    Anginal Classification: CCS III  Anti-ischemic medical therapy: Maximal Therapy (2 or more classes of medications)  Non-Invasive Test Results: No non-invasive testing performed  Prior CABG: Previous CABG      History and Physical Interval Note:  01/11/2019 11:08 AM  [name] [name]  has presented today for surgery, with the diagnosis of chest pain.  The various methods of treatment have been discussed with the patient and family. After consideration of risks, benefits and other options for treatment, the patient has consented to  Procedure(s): LEFT HEART CATH AND CORS/GRAFTS ANGIOGRAPHY (N/A) as a surgical intervention.  The patient's history has been reviewed, patient examined, no change in status, stable for surgery.  I have reviewed the patient's chart and labs.  Questions were answered to the patient's satisfaction.     Sherren Mocha

## 2019-01-11 NOTE — H&P (Signed)
Cardiology Admission History and Physical:   Patient ID: [name] [name] MRN: [id number]; DOB: 1946/08/25   Admission date: 01/11/2019  Primary Care Provider: Leighton Ruff, MD Primary Cardiologist: Sherren Mocha, MD  Primary Electrophysiologist:  None   Chief Complaint:  Chest pain  Patient Profile:   [name] [name] is a 73 y.o. male with history of CAD, previous CABG, and saphenous vein graft PCI.  He called in this week with worsening anginal symptoms and is referred for cardiac catheterization and possible PCI.  History of Present Illness:   [name] [name] underwent multivessel CABG in 2009.  He had early graft failure involving the distal anastomotic lesion of the saphenous vein graft to OM and underwent PCI remotely.  He had a repeat PCI for treatment of restenosis in the same vein graft in 2017 with implantation of a drug-eluting stent.  The patient is treated with chronic antianginal therapy and he has been stable for the past few years.  However, he called in earlier this week with worsening symptoms of angina both on exertion and sometimes at rest.  He describes substernal chest pressure, nonradiating.  There is mild shortness of breath associated with this.  No diaphoresis, lightheadedness, nausea, or vomiting.  Because of his worsening symptoms, now with frequent nocturnal angina, he is referred for cardiac catheterization and possible PCI.  He has been treated with ticagrelor long-term.  He was advised to start back on low-dose aspirin this week.   Past Medical History:  Diagnosis Date  . Arrhythmia    Premature beats  . Atherosclerosis of native arteries of the extremities with intermittent claudication   . Coronary atherosclerosis of artery bypass graft    CABG 2009, LHC 07/2016 restenosis of SVG to OM treated with DES  . Diabetes mellitus ([place])   . Essential hypertension, benign   . Pure hypercholesterolemia     Past Surgical History:  Procedure Laterality Date  .  ADENOIDECTOMY    . CARDIAC CATHETERIZATION N/A 07/18/2016   Procedure: Left Heart Cath and Cors/Grafts Angiography;  Surgeon: Burnell Blanks, MD;  Location: [facility];  Service: Cardiovascular;  Laterality: N/A;  . CARDIAC CATHETERIZATION N/A 07/18/2016   Procedure: Coronary Stent Intervention;  Surgeon: Burnell Blanks, MD;  Location: [facility];  Service: Cardiovascular;  Laterality: N/A;  . CHOLECYSTECTOMY    . CORONARY ANGIOPLASTY WITH STENT PLACEMENT    . CORONARY ARTERY BYPASS GRAFT    . INGUINAL HERNIA REPAIR Right   . TONSILLECTOMY       Medications Prior to Admission: Prior to Admission medications   Medication Sig Start Date End Date Taking? Authorizing Provider  amLODipine (NORVASC) 10 MG tablet TAKE 1 TABLET BY MOUTH EVERY DAY Patient taking differently: Take 10 mg by mouth daily.  07/05/18  Yes Eileen Stanford, PA-C  benazepril (LOTENSIN) 40 MG tablet Take 40 mg by mouth daily.  04/30/12  Yes [provider]  BRILINTA 90 MG TABS tablet TAKE 1 TABLET (90 MG TOTAL) BY MOUTH 2 (TWO) TIMES DAILY. 08/02/18  Yes Sherren Mocha, MD  glimepiride (AMARYL) 2 MG tablet Take 3 mg by mouth daily.   Yes [provider]  hydrochlorothiazide (HYDRODIURIL) 25 MG tablet TAKE 1 TABLET BY MOUTH EVERY DAY Patient taking differently: Take 25 mg by mouth daily.  07/11/18  Yes Sherren Mocha, MD  insulin NPH Human (NOVOLIN N) 100 UNIT/ML injection Inject 0.2 mLs (20 Units total) into the skin every morning. And syringes 1/day Patient taking  differently: Inject 5 Units into the skin every morning.  08/28/18  Yes Renato Shin, MD  isosorbide mononitrate (IMDUR) 30 MG 24 hr tablet TAKE 1 TABLET BY MOUTH DAILY Patient taking differently: Take 60 mg by mouth daily.  07/11/18  Yes Lyda Jester M, PA-C  metFORMIN (GLUCOPHAGE) 500 MG tablet Take 1,000 mg by mouth 2 (two) times daily.  04/23/13  Yes [provider]  metoprolol tartrate  (LOPRESSOR) 50 MG tablet TAKE 1.5 TABLETS (75 MG TOTAL) BY MOUTH 2 (TWO) TIMES DAILY. 05/15/18  Yes Sherren Mocha, MD  Multiple Vitamin (MULTIVITAMIN WITH MINERALS) TABS tablet Take 1 tablet by mouth daily.   Yes [provider]  nitroGLYCERIN (NITROSTAT) 0.4 MG SL tablet Place 1 tablet (0.4 mg total) under the tongue every 5 (five) minutes as needed for chest pain. 01/07/19  Yes Sherren Mocha, MD  rosuvastatin (CRESTOR) 20 MG tablet TAKE 1 TABLET (20 MG TOTAL) BY MOUTH DAILY. 11/12/18  Yes Sherren Mocha, MD  glimepiride (AMARYL) 1 MG tablet Take 1 tablet (1 mg total) by mouth daily with breakfast. Patient not taking: Reported on 01/07/2019 12/12/18   Renato Shin, MD     Allergies:   No Known Allergies  Social History:   Social History   Socioeconomic History  . Marital status: Married    Spouse name: Not on file  . Number of children: Not on file  . Years of education: Not on file  . Highest education level: Not on file  Occupational History  . Not on file  Social Needs  . Financial resource strain: Not on file  . Food insecurity:    Worry: Not on file    Inability: Not on file  . Transportation needs:    Medical: Not on file    Non-medical: Not on file  Tobacco Use  . Smoking status: Never Smoker  . Smokeless tobacco: Never Used  Substance and Sexual Activity  . Alcohol use: No  . Drug use: No  . Sexual activity: Not on file  Lifestyle  . Physical activity:    Days per week: Not on file    Minutes per session: Not on file  . Stress: Not on file  Relationships  . Social connections:    Talks on phone: Not on file    Gets together: Not on file    Attends religious service: Not on file    Active member of club or organization: Not on file    Attends meetings of clubs or organizations: Not on file    Relationship status: Not on file  . Intimate partner violence:    Fear of current or ex partner: Not on file    Emotionally abused: Not on file    Physically  abused: Not on file    Forced sexual activity: Not on file  Other Topics Concern  . Not on file  Social History Narrative  . Not on file    Family History:   The patient's family history includes Heart disease in his mother. There is no history of Diabetes.    ROS:  Please see the history of present illness.  All other ROS reviewed and negative.     Physical Exam/Data:   Vitals:   01/11/19 0700  BP: 138/74  Pulse: 67  Temp: 97.7 F (36.5 C)  SpO2: 99%  Weight: 88.2 kg  Height: 5' 9"  (1.753 m)   No intake or output data in the 24 hours ending 01/11/19 1054 Last 3 Weights 01/11/2019  12/12/2018 08/28/2018  Weight (lbs) 194 lb 6 oz 199 lb 12.8 oz 215 lb 9.6 oz  Weight (kg) 88.168 kg 90.629 kg 97.796 kg     Body mass index is 28.7 kg/m.  General:  Well nourished, well developed, in no acute distress HEENT: normal Lymph: no adenopathy Neck: no JVD Endocrine:  No thryomegaly Vascular: Bilateral carotid bruits Cardiac:  normal S1, S2; RRR; 3/6 harsh mid peaking systolic murmur at the right upper sternal border Lungs:  clear to auscultation bilaterally, no wheezing, rhonchi or rales  Abd: soft, nontender, no hepatomegaly  Ext: no edema Musculoskeletal:  No deformities, BUE and BLE strength normal and equal Skin: warm and dry  Neuro:  CNs 2-12 intact, no focal abnormalities noted Psych:  Normal affect    EKG:  The ECG that was done today was personally reviewed and demonstrates normal sinus rhythm 68 bpm, within normal limits.  Relevant CV Studies: 2D echocardiogram 07/02/2018: Study Conclusions  - Left ventricle: The cavity size was normal. Wall thickness was   increased in a pattern of moderate LVH. Systolic function was   normal. The estimated ejection fraction was in the range of 55%   to 60%. Wall motion was normal; there were no regional wall   motion abnormalities. The study is indeterminate for   quantification of LV diastolic function. - Aortic valve: Valve  mobility was moderately restricted. There was   mild stenosis. Mean gradient (S): 14 mm Hg. Peak gradient (S): 27   mm Hg. Valve area (VTI): 1.59 cm^2. - Mitral valve: There was no significant regurgitation. - Right ventricle: Systolic function was normal. - Tricuspid valve: There was trivial regurgitation. - Pulmonic valve: There was no significant regurgitation. - Pulmonary arteries: Systolic pressure was within the normal   range.  Impressions:  - Technically difficult study. Normal LV EF. Calcified and   thickened aortic valve, with mild stenosis.  Cardiac catheterization 07/18/2016: Conclusion     Prox RCA to Mid RCA lesion, 30 %stenosed.  Mid RCA to Dist RCA lesion, 60 %stenosed.  SVG graft was visualized by angiography and is normal in caliber and anatomically normal.  Dist RCA lesion, 40 %stenosed.  Ost Cx to Prox Cx lesion, 99 %stenosed.  SVG graft was visualized by angiography and is normal in caliber.  Dist Graft to Insertion lesion, 99 %stenosed.  Ost LAD to Prox LAD lesion, 100 %stenosed.  SVG graft was visualized by angiography and is normal in caliber and anatomically normal.  LIMA graft was visualized by angiography and is normal in caliber and anatomically normal.  Dist LAD lesion, 70 %stenosed.  LV end diastolic pressure is mildly elevated.   1. 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  2. The LAD is occluded proximally. The LIMA graft is patent to the mid LAD and fills the mid and distal vessel. The patent vein graft fills the diagonal branch.  3. The Circumflex is occluded proximally. The OM branch fills from a patent vein graft. The vein graft has severe restenosis in the stented segment at the anastomosis of the vein graft to the OM.  4. The RCA has moderate disease throughout. There is competitive flow distally from the patent vein graft to the distal RCA.  5. Successful PTCA/DES x 1 distal body of SVG to OM.    Recommendations: Continue DAPT with ASA and Brilinta (He will be enrolled in the TWILIGHT study protocol). Continue statin and beta blocker.    Coronary Diagrams  Diagnostic  Dominance: Right    Laboratory Data:  Chemistry Recent Labs  Lab 01/11/19 0720  NA 137  K 4.0  CL 103  CO2 23  GLUCOSE 182*  BUN 15  CREATININE 1.02  CALCIUM 9.5  GFRNONAA >60  GFRAA >60  ANIONGAP 11    No results for input(s): PROT, ALBUMIN, AST, ALT, ALKPHOS, BILITOT in the last 168 hours. Hematology Recent Labs  Lab 01/11/19 0720  WBC 11.6*  RBC 4.81  HGB 13.5  HCT 41.9  MCV 87.1  MCH 28.1  MCHC 32.2  RDW 14.0  PLT 297   Cardiac EnzymesNo results for input(s): TROPONINI in the last 168 hours. No results for input(s): TROPIPOC in the last 168 hours.  BNPNo results for input(s): BNP, PROBNP in the last 168 hours.  DDimer No results for input(s): DDIMER in the last 168 hours.  Radiology/Studies:  No results found.  Assessment and Plan:   1. Crescendo angina: The patient is on aggressive medical therapy with isosorbide, metoprolol, and amlodipine.  He is treated on long-term antiplatelet therapy with ticagrelor.  Considering his worsening symptoms, now CCS class III/IV, I have recommended repeat cardiac catheterization with an eye toward PCI. I have reviewed the risks, indications, and alternatives to cardiac catheterization, possible angioplasty, and stenting with the patient. Risks include but are not limited to bleeding, infection, vascular injury, stroke, myocardial infection, arrhythmia, kidney injury, radiation-related injury in the case of prolonged fluoroscopy use, emergency cardiac surgery, and death. The patient understands the risks of serious complication is 1-2 in 3154 with diagnostic cardiac cath and 1-2% or less with angioplasty/stenting.  Plan a left radial approach.  He has been started back on aspirin in addition to ticagrelor.  His isosorbide has been increased to 60 mg  daily. 2. Aortic stenosis, mild: Exam suggestive of at least moderate aortic stenosis.  Echo recently done in September 2019 reviewed.  Will perform invasive hemodynamic assessment of aortic stenosis severity.  For questions or updates, please contact [facility] Please consult [email or web address] for contact info under   Signed, Sherren Mocha, MD  01/11/2019 10:54 AM

## 2019-01-11 NOTE — Discharge Summary (Signed)
Discharge Summary/SAME DAY PCI    Patient ID: [name] [name],  MRN: [id number], DOB/AGE: 1946-07-21 73 y.o.  Admit date: 01/11/2019 Discharge date: 01/11/2019  Primary Care Provider: Leighton Ruff Primary Cardiologist: Dr. [name]  Discharge Diagnoses    Active Problems:   Crescendo angina [facility])   Allergies No Known Allergies  Diagnostic Studies/Procedures    Cath: 01/11/2019   Ost LAD to Prox LAD lesion is 100% stenosed.  Dist LAD lesion is 70% stenosed.  Ost Cx to Prox Cx lesion is 99% stenosed.  Prox RCA to Mid RCA lesion is 100% stenosed.  Dist RCA lesion is 40% stenosed.  SVG and is normal in caliber.  SVG and is normal in caliber.  SVG and is normal in caliber.  LIMA and is normal in caliber.  Previously placed Dist Graft to Insertion stent (unknown type) is widely patent.  Ost RPDA to RPDA lesion is 95% stenosed.  A drug-eluting stent was successfully placed using a STENT SYNERGY DES [id number].  Post intervention, there is a 0% residual stenosis.   1.  Severe native vessel coronary artery disease with known total occlusion of the LAD, left circumflex, and RCA 2.  Status post aortocoronary bypass surgery with continued patency of the LIMA to LAD, continued patency of the saphenous vein graft to diagonal, continued patency of the saphenous vein graft and stented segment into OM 2, and continued patency of the saphenous vein graft to distal RCA with severe downstream stenosis involving the PDA origin 3.  Successful PCI of the right PDA using high-pressure balloon angioplasty followed by stenting with a 2.25 x 16 mm Synergy DES postdilated to high pressure with a 2.5 mm noncompliant balloon  Recommend: Same day discharge per protocol, continue aspirin and ticagrelor long-term  _____________   History of Present Illness    73 yo male with PMH of CAD, previous CABG and SVG PCI who called into the office with worsening anginal symptoms.    [name] [name]  underwent multivessel CABG in 2009.  He had early graft failure involving the distal anastomotic lesion of the saphenous vein graft to OM and underwent PCI remotely.  He had a repeat PCI for treatment of restenosis in the same vein graft in 2017 with implantation of a drug-eluting stent.  The patient is treated with chronic antianginal therapy and he has been stable for the past few years.  However, he called in earlier the week of cath with worsening symptoms of angina both on exertion and sometimes at rest.  He described substernal chest pressure, nonradiating.  There was mild shortness of breath associated with this.  No diaphoresis, lightheadedness, nausea, or vomiting.  Because of his worsening symptoms, with frequent nocturnal angina, he was referred for cardiac catheterization and possible PCI.  He has been treated with ticagrelor long-term.  He was advised to start back on low-dose aspirin the week of cath.    Hospital Course     Underwent cardiac cath noted above with known severe native disease with patent LIMA-LAD, SVG-Diag, SVG-(DES)OM2, and SVG-RCA with stenosis to the PDA. Successful PCI to the rPDA. Plan for DAPT with ASA/Brilinta long term. Seen by cardiac rehab while in short stay. No complications post cath. Radial site stable. Instructions/precautions regarding cath site care given prior to discharge.    [name] [name] was seen by Dr. [name] and determined stable for discharge home. Follow up in the office has been arranged. Medications are listed below.   _____________  Discharge  Vitals Blood pressure 129/69, pulse 67, temperature 97.9 F (36.6 C), temperature source Oral, height 5' 9"  (1.753 m), weight 88.2 kg, SpO2 98 %.  Filed Weights   01/11/19 0700  Weight: 88.2 kg    Labs & Radiologic Studies    CBC Recent Labs    01/11/19 0720  WBC 11.6*  HGB 13.5  HCT 41.9  MCV 87.1  PLT 563   Basic Metabolic Panel Recent Labs    01/11/19 0720  NA 137  K 4.0  CL  103  CO2 23  GLUCOSE 182*  BUN 15  CREATININE 1.02  CALCIUM 9.5   Liver Function Tests No results for input(s): AST, ALT, ALKPHOS, BILITOT, PROT, ALBUMIN in the last 72 hours. No results for input(s): LIPASE, AMYLASE in the last 72 hours. Cardiac Enzymes No results for input(s): CKTOTAL, CKMB, CKMBINDEX, TROPONINI in the last 72 hours. BNP Invalid input(s): POCBNP D-Dimer No results for input(s): DDIMER in the last 72 hours. Hemoglobin A1C No results for input(s): HGBA1C in the last 72 hours. Fasting Lipid Panel No results for input(s): CHOL, HDL, LDLCALC, TRIG, CHOLHDL, LDLDIRECT in the last 72 hours. Thyroid Function Tests No results for input(s): TSH, T4TOTAL, T3FREE, THYROIDAB in the last 72 hours.  Invalid input(s): FREET3 _____________  No results found. Disposition   Pt is being discharged home today in good condition.  Follow-up Plans & Appointments    Follow-up Information    Daune Perch, NP Follow up on 01/18/2019.   Specialty:  Cardiology Why:  at 3:30pm for your follow up appt (this will be a telehealth visit) Contact information: [address] [place] [address] [phone]          Discharge Instructions    Amb Referral to Cardiac Rehabilitation   Complete by:  As directed    Diagnosis:  Coronary Stents       Discharge Medications     Medication List    TAKE these medications   amLODipine 10 MG tablet Commonly known as:  NORVASC TAKE 1 TABLET BY MOUTH EVERY DAY   aspirin EC 81 MG tablet Take 1 tablet (81 mg total) by mouth daily.   benazepril 40 MG tablet Commonly known as:  LOTENSIN Take 40 mg by mouth daily.   Brilinta 90 MG Tabs tablet Generic drug:  ticagrelor TAKE 1 TABLET (90 MG TOTAL) BY MOUTH 2 (TWO) TIMES DAILY.   glimepiride 2 MG tablet Commonly known as:  AMARYL Take 3 mg by mouth daily. What changed:  Another medication with the same name was removed. Continue taking this medication, and follow  the directions you see here.   hydrochlorothiazide 25 MG tablet Commonly known as:  HYDRODIURIL TAKE 1 TABLET BY MOUTH EVERY DAY   insulin NPH Human 100 UNIT/ML injection Commonly known as:  NovoLIN N Inject 0.2 mLs (20 Units total) into the skin every morning. And syringes 1/day What changed:    how much to take  additional instructions   isosorbide mononitrate 30 MG 24 hr tablet Commonly known as:  IMDUR TAKE 1 TABLET BY MOUTH DAILY What changed:  how much to take   metFORMIN 500 MG tablet Commonly known as:  GLUCOPHAGE Take 1,000 mg by mouth 2 (two) times daily.   metoprolol tartrate 50 MG tablet Commonly known as:  LOPRESSOR TAKE 1.5 TABLETS (75 MG TOTAL) BY MOUTH 2 (TWO) TIMES DAILY.   multivitamin with minerals Tabs tablet Take 1 tablet by mouth daily.   nitroGLYCERIN 0.4 MG  SL tablet Commonly known as:  NITROSTAT Place 1 tablet (0.4 mg total) under the tongue every 5 (five) minutes as needed for chest pain.   rosuvastatin 20 MG tablet Commonly known as:  CRESTOR TAKE 1 TABLET (20 MG TOTAL) BY MOUTH DAILY.       Acute coronary syndrome (MI, NSTEMI, STEMI, etc) this admission?: No.     Outstanding Labs/Studies   N/a   Duration of Discharge Encounter   Greater than 30 minutes including physician time.  Signed, Reino Bellis NP-C 01/11/2019, 3:17 PM

## 2019-01-14 ENCOUNTER — Encounter (HOSPITAL_COMMUNITY): Payer: Self-pay | Admitting: Cardiovascular Disease

## 2019-01-16 ENCOUNTER — Telehealth (HOSPITAL_COMMUNITY): Payer: Self-pay

## 2019-01-16 NOTE — Telephone Encounter (Signed)
Called and spoke with pt in regards to CR, pt stated he does not wish to participate at this time. He is doing some walking around his home.  Closed referral

## 2019-01-17 ENCOUNTER — Encounter: Payer: Self-pay | Admitting: Cardiology

## 2019-01-17 NOTE — Patient Instructions (Addendum)
Medication Instructions:  Stop Brilinta after the 30 day prescription ends. On the next day start Plavix. Take 300 mg (4 X 75 mg pills) on the first day then take one 75 mg pill daily. Continue aspirin 81 mg daily.    If you need a refill on your cardiac medications before your next appointment, please call your pharmacy.   Lab work: None If you have labs (blood work) drawn today and your tests are completely normal, you will receive your results only by:  [place] (if you have MyChart) OR  A paper copy in the mail If you have any lab test that is abnormal or we need to change your treatment, we will call you to review the results.  Testing/Procedures: None  Follow-Up: At [facility], you and your health needs are our priority.  As part of our continuing mission to provide you with exceptional heart care, we have created designated Provider Care Teams.  These Care Teams include your primary Cardiologist (physician) and Advanced Practice Providers (APPs -  Physician Assistants and Nurse Practitioners) who all work together to provide you with the care you need, when you need it. You will need a follow up appointment in:  6 months.  Please call our office 2 months in advance to schedule this appointment.  You may see Sherren Mocha, MD or one of the following Advanced Practice Providers on your designated Care Team: Richardson Dopp, PA-C [place], [place]  Daune Perch, NP  Any Other Special Instructions Will Be Listed Below (If Applicable).  Cardiovascular risk modification: -Recommend heart healthy/Mediterranean diet, with whole grains, fruits, vegetable, fish, lean meats, nuts, and olive oil. Limit salt. -Recommend moderate walking, 3-5 times/week for 30-50 minutes each session. Aim for at least 150 minutes.week. Goal should be pace of 3 miles/hours, or walking 1.5 miles in 30 minutes -Recommend avoidance of tobacco products. Avoid excess  alcohol.     __________________________________________________________________________________________________________  Coronary Artery Disease, Male  Coronary artery disease (CAD) is a condition in which the arteries that lead to the heart (coronary arteries) become narrow or blocked. The narrowing or blockage can lead to decreased blood flow to the heart. Prolonged reduced blood flow can cause a heart attack (myocardial infarction or MI). This condition may also be called coronary heart disease. Because CAD is the leading cause of death in men, it is important to understand what causes this condition and how it is treated. What are the causes? CAD is most often caused by atherosclerosis. This is the buildup of fat and cholesterol (plaque) on the inside of the arteries. Over time, the plaque may narrow or block the artery, reducing blood flow to the heart. Plaque can also become weak and break off within a coronary artery and cause a sudden blockage. Other less common causes of CAD include:  An embolism or blood clot in a coronary artery.  A tearing of the artery (spontaneous coronary artery dissection).  An aneurysm.  Inflammation (vasculitis) in the artery wall. What increases the risk? The following factors may make you more likely to develop this condition:  Age. Men over age 64 are at a greater risk of CAD.  Family history of CAD.  Gender. Men often develop CAD earlier in life than women.  High blood pressure (hypertension).  Diabetes.  High cholesterol levels.  Tobacco use.  Excessive alcohol use.  Lack of exercise.  A diet high in saturated and trans fats, such as fried food and processed meat. Other possible risk  factors include:  High stress levels.  Depression.  Obesity.  Sleep apnea. What are the signs or symptoms? Many people do not have any symptoms during the early stages of CAD. As the condition progresses, symptoms may include:  Chest pain  (angina). The pain can: ? Feel like a crushing or squeezing, or a tightness, pressure, fullness, or heaviness in the chest. ? Last more than a few minutes or can stop and recur. The pain tends to get worse with exercise or stress and to fade with rest.  Pain in the arms, neck, jaw, or back.  Unexplained heartburn or indigestion.  Shortness of breath.  Nausea or vomiting.  Sudden light-headedness.  Sudden cold sweats.  Fluttering or fast heartbeat (palpitations). How is this diagnosed? This condition is diagnosed based on:  Your family and medical history.  A physical exam.  Tests, including: ? A test to check the electrical signals in your heart (electrocardiogram). ? Exercise stress test. This looks for signs of blockage when the heart is stressed with exercise, such as running on a treadmill. ? Pharmacologic stress test. This test looks for signs of blockage when the heart is being stressed with a medicine. ? Blood tests. ? Coronary angiogram. This is a procedure to look at the coronary arteries to see if there is any blockage. During this test, a dye is injected into your arteries so they appear on an X-ray. ? A test that uses sound waves to take a picture of your heart (echocardiogram). ? Chest X-ray. How is this treated? This condition may be treated by:  Healthy lifestyle changes to reduce risk factors.  Medicines such as: ? Antiplatelet medicines and blood-thinning medicines, such as aspirin. These help to prevent blood clots. ? Nitroglycerin. ? Blood pressure medicines. ? Cholesterol-lowering medicine.  Coronary angioplasty and stenting. During this procedure, a thin, flexible tube is inserted through a blood vessel and into a blocked artery. A balloon or similar device on the end of the tube is inflated to open up the artery. In some cases, a small, mesh tube (stent) is inserted into the artery to keep it open.  Coronary artery bypass surgery. During this  surgery, veins or arteries from other parts of the body are used to create a bypass around the blockage and allow blood to reach your heart. Follow these instructions at home: Medicines  Take over-the-counter and prescription medicines only as told by your health care provider.  Do not take the following medicines unless your health care provider approves: ? NSAIDs, such as ibuprofen, naproxen, or celecoxib. ? Vitamin supplements that contain vitamin A, vitamin E, or both. Lifestyle  Follow an exercise program approved by your health care provider. Aim for 150 minutes of moderate exercise or 75 minutes of vigorous exercise each week.  Maintain a healthy weight or lose weight as approved by your health care provider.  Rest when you are tired.  Learn to manage stress or try to limit your stress. Ask your health care provider for suggestions if you need help.  Get screened for depression and seek treatment, if needed.  Do not use any products that contain nicotine or tobacco, such as cigarettes and e-cigarettes. If you need help quitting, ask your health care provider.  Do not use illegal drugs. Eating and drinking  Follow a heart-healthy diet. A dietitian can help educate you about healthy food options and changes. In general, eat plenty of fruits and vegetables, lean meats, and whole grains.  Avoid foods high  in: ? Sugar. ? Salt (sodium). ? Saturated fat, such as processed or fatty meat. ? Trans fat, such as fried foods.  Use healthy cooking methods such as roasting, grilling, broiling, baking, poaching, steaming, or stir-frying.  If you drink alcohol, and your health care provider approves, limit your alcohol intake to no more than 2 drinks per day. One drink equals 12 ounces of beer, 5 ounces of wine, or 1 ounces of hard liquor. General instructions  Manage any other health conditions, such as hypertension and diabetes. These conditions affect your heart.  Your health care  provider may ask you to monitor your blood pressure. Ideally, your blood pressure should be below 130/80.  Keep all follow-up visits as told by your health care provider. This is important. Get help right away if:  You have pain in your chest, neck, arm, jaw, stomach, or back that: ? Lasts more than a few minutes. ? Is recurring. ? Is not relieved by taking medicine under your tongue (sublingualnitroglycerin).  You have too much (profuse) sweating without cause.  You have unexplained: ? Heartburn or indigestion. ? Shortness of breath or difficulty breathing. ? Fluttering or fast heartbeat (palpitations). ? Nausea or vomiting. ? Fatigue. ? Feelings of nervousness or anxiety. ? Weakness. ? Diarrhea.  You have sudden light-headedness or dizziness.  You faint.  You feel like hurting yourself or think about taking your own life. These symptoms may represent a serious problem that is an emergency. Do not wait to see if the symptoms will go away. Get medical help right away. Call your local emergency services (911 in the [place] Do not drive yourself to the hospital. Summary  Coronary artery disease (CAD) is a process in which the arteries that lead to the heart (coronary arteries) become narrow or blocked. The narrowing or blockage can lead to a heart attack.  Many people do not have any symptoms during the early stages of CAD. This is called "silent CAD."  CAD can be treated with lifestyle changes, medicines, surgery, or a combination of these treatments. This information is not intended to replace advice given to you by your health care provider. Make sure you discuss any questions you have with your health care provider. Document Released: 04/30/2014 Document Revised: 09/23/2016 Document Reviewed: 09/23/2016 Elsevier Interactive Patient Education  2019 [employer].

## 2019-01-17 NOTE — Progress Notes (Signed)
Virtual Visit via Video Note    Evaluation Performed:  Follow-up visit  This visit type was conducted due to national recommendations for restrictions regarding the COVID-19 Pandemic (e.g. social distancing).  This format is felt to be most appropriate for this patient at this time.  All issues noted in this document were discussed and addressed.  No physical exam was performed (except for noted visual exam findings with Video Visits).  Please refer to the patient's chart (MyChart message for video visits and phone note for telephone visits) for the patient's consent to telehealth for [facility].  Date:  01/18/2019   ID:  [name] [name], DOB March 29, 1946, MRN [id number]  Patient Location:  Home  Provider location:   Home  PCP:  Leighton Ruff, MD  Cardiologist:  Sherren Mocha, MD  Electrophysiologist:  None   Chief Complaint:  Follow up same day PCI  History of Present Illness:    [name] [name] is a 73 y.o. male who presents via video conferencing for a telehealth visit today.    The patient does not have symptoms concerning for COVID-19 infection (fever, chills, cough, or new shortness of breath).   [name] [name] has a past medical history significant for CAD, previous CABG and SVG PCI. He called our office with complaints of worsening anginal symptoms. [name] multivessel CABG in 2009. He had early graft failure involving the distal anastomotic lesion of the saphenous vein graft to OM and underwent PCI remotely. He had a repeat PCI for treatment of restenosis in the same vein graft in 2017 with implantation of a drug-eluting stent.  The patient is treated with chronic antianginal therapy and he has been stable for the past few years. Per phone call on 01/07/2019, pt with worsening symptoms he was arranged for cardiac cath that was done on 01/11/2019 with finding of  known severe native disease with patent LIMA-LAD, SVG-Diag, SVG-(DES)OM2, and SVG-RCA with stenosis  to the PDA. He underwent successful PCI to the rPDA. Plan for DAPT with ASA and Brilinta.   Pt is feeling better since stent. The chest pressure that he was feeling in the night or with activity has resolved. He walked outside yesterday with no exertional discomfort. No shortness of breath. No lightheadedness, dizziness, orthopnea, PND or palpitations.   Home BPs running 120's/60's with 69 HR.   Radial cath site still has some resolving bruising. No hematoma or drainage.    Prior CV studies:   The following studies were reviewed today:  Cath: 01/11/2019  Ost LAD to Prox LAD lesion is 100% stenosed.  Dist LAD lesion is 70% stenosed.  Ost Cx to Prox Cx lesion is 99% stenosed.  Prox RCA to Mid RCA lesion is 100% stenosed.  Dist RCA lesion is 40% stenosed.  SVG and is normal in caliber.  SVG and is normal in caliber.  SVG and is normal in caliber.  LIMA and is normal in caliber.  Previously placed Dist Graft to Insertion stent (unknown type) is widely patent.  Ost RPDA to RPDA lesion is 95% stenosed.  A drug-eluting stent was successfully placed using a STENT SYNERGY DES [id number].  Post intervention, there is a 0% residual stenosis.  1. Severe native vessel coronary artery disease with known total occlusion of the LAD, left circumflex, and RCA 2. Status post aortocoronary bypass surgery with continued patency of the LIMA to LAD, continued patency of the saphenous vein graft to diagonal, continued patency of the saphenous vein graft and stented segment  into OM 2, and continued patency of the saphenous vein graft to distal RCA with severe downstream stenosis involving the PDA origin 3. Successful PCI of the right PDA using high-pressure balloon angioplasty followed by stenting with a 2.25 x 16 mm Synergy DES postdilated to high pressure with a 2.5 mm noncompliant balloon  Recommend: Same day discharge per protocol, continue aspirin and ticagrelor long-term  Past Medical  History:  Diagnosis Date  . Arrhythmia    Premature beats  . Atherosclerosis of native arteries of the extremities with intermittent claudication   . Coronary atherosclerosis of artery bypass graft    CABG 2009, LHC 07/2016 restenosis of SVG to OM treated with DES  . Diabetes mellitus ([place])   . Essential hypertension, benign   . Pure hypercholesterolemia    Past Surgical History:  Procedure Laterality Date  . ADENOIDECTOMY    . CARDIAC CATHETERIZATION N/A 07/18/2016   Procedure: Left Heart Cath and Cors/Grafts Angiography;  Surgeon: Burnell Blanks, MD;  Location: [place] [facility];  Service: Cardiovascular;  Laterality: N/A;  . CARDIAC CATHETERIZATION N/A 07/18/2016   Procedure: Coronary Stent Intervention;  Surgeon: Burnell Blanks, MD;  Location: [place] [facility];  Service: Cardiovascular;  Laterality: N/A;  . CHOLECYSTECTOMY    . CORONARY ANGIOPLASTY WITH STENT PLACEMENT    . CORONARY ARTERY BYPASS GRAFT    . CORONARY STENT INTERVENTION N/A 01/11/2019   Procedure: CORONARY STENT INTERVENTION;  Surgeon: Sherren Mocha, MD;  Location: [place] [facility];  Service: Cardiovascular;  Laterality: N/A;  . INGUINAL HERNIA REPAIR Right   . LEFT HEART CATH AND CORS/GRAFTS ANGIOGRAPHY N/A 01/11/2019   Procedure: LEFT HEART CATH AND CORS/GRAFTS ANGIOGRAPHY;  Surgeon: Sherren Mocha, MD;  Location: [place] [facility];  Service: Cardiovascular;  Laterality: N/A;  . TONSILLECTOMY       Current Meds  Medication Sig  . amLODipine (NORVASC) 10 MG tablet TAKE 1 TABLET BY MOUTH EVERY DAY (Patient taking differently: Take 10 mg by mouth daily. )  . aspirin EC 81 MG tablet Take 1 tablet (81 mg total) by mouth daily.  . benazepril (LOTENSIN) 40 MG tablet Take 40 mg by mouth daily.   Marland Kitchen BRILINTA 90 MG TABS tablet TAKE 1 TABLET (90 MG TOTAL) BY MOUTH 2 (TWO) TIMES DAILY.  Marland Kitchen glimepiride (AMARYL) 2 MG tablet Take 3 mg by mouth daily.  . hydrochlorothiazide (HYDRODIURIL) 25 MG tablet TAKE 1  TABLET BY MOUTH EVERY DAY (Patient taking differently: Take 25 mg by mouth daily. )  . insulin NPH Human (NOVOLIN N) 100 UNIT/ML injection Inject 0.2 mLs (20 Units total) into the skin every morning. And syringes 1/day (Patient taking differently: Inject 10 Units into the skin every morning. )  . isosorbide mononitrate (IMDUR) 60 MG 24 hr tablet Take 1 tablet (60 mg total) by mouth daily.  . metFORMIN (GLUCOPHAGE) 500 MG tablet Take 1,000 mg by mouth 2 (two) times daily.   . metoprolol tartrate (LOPRESSOR) 50 MG tablet TAKE 1.5 TABLETS (75 MG TOTAL) BY MOUTH 2 (TWO) TIMES DAILY.  . Multiple Vitamin (MULTIVITAMIN WITH MINERALS) TABS tablet Take 1 tablet by mouth daily.  . nitroGLYCERIN (NITROSTAT) 0.4 MG SL tablet Place 1 tablet (0.4 mg total) under the tongue every 5 (five) minutes as needed for chest pain.  . rosuvastatin (CRESTOR) 20 MG tablet TAKE 1 TABLET (20 MG TOTAL) BY MOUTH DAILY.     Allergies:   Patient has no known allergies.   Social History   Tobacco  Use  . Smoking status: Never Smoker  . Smokeless tobacco: Never Used  Substance Use Topics  . Alcohol use: No  . Drug use: No     Family Hx: The patient's family history includes Heart disease in his mother. There is no history of Diabetes.  ROS:   Please see the history of present illness.     All other systems reviewed and are negative.   Labs/Other Tests and Data Reviewed:    Recent Labs: 01/11/2019: BUN 15; Creatinine, Ser 1.02; Hemoglobin 13.5; Platelets 297; Potassium 4.0; Sodium 137   Recent Lipid Panel Lab Results  Component Value Date/Time   CHOL 109 07/18/2016 12:37 AM   TRIG 220 (H) 07/18/2016 12:37 AM   HDL 25 (L) 07/18/2016 12:37 AM   CHOLHDL 4.4 07/18/2016 12:37 AM   LDLCALC 40 07/18/2016 12:37 AM    Wt Readings from Last 3 Encounters:  01/18/19 193 lb (87.5 kg)  01/11/19 194 lb 6 oz (88.2 kg)  12/12/18 199 lb 12.8 oz (90.6 kg)     Objective:    Vital Signs:  BP (!) 149/64   Pulse 71   Ht  5' 9"  (1.753 m)   Wt 193 lb (87.5 kg)   BMI 28.50 kg/m    Well nourished, well developed male in no acute distress. Breathing appears normal, pt able to speak in complete sentences. No audible cough or wheezing.   ASSESSMENT & PLAN:    1. CAD s/p DES to rPDA 01/11/2019 -Past hx CABG 2009 and OM graft stenosis with DES on 2 occasions.  -Pt continues on DAPT with ASA/Brilinta to be continued long term. Per Dr. [name], after 1 month of Brilinta pt will switch to Plavix starting with a 300 mg loading dose followed by 75 mg daily. I have gone over these instructions with the patient and he understands.  I will send in Rx.  - Anginal therapy includes isosorbide, metoprolol and amlodipine.  -Exertional chest pain has resolved after stent placement.  -Pt advised on risk factor modification with diet and exercise.   2. Hypertension -On amlodipine 10 mg daily, benazapril 40 mg, HCTZ 25 mg daily -SBP slightly elevated today but home BPs have been well controlled.  Will continue current therapy and conitnue to monitor.   3. Hyperlipidemia -Labs per [facility] 11/29/18: TC 105, LDL 34, HDL 37, Trig 169.  -LDL is at goal of <70 on rosuvastatin 20 mg daily. Continue current therapy.   4. Type 2 DM -A1c 7.4 on 12/12/2018. Acceptable for his age.    COVID-19 Education: The signs and symptoms of COVID-19 were discussed with the patient and how to seek care for testing (follow up with PCP or arrange E-visit).  The importance of social distancing was discussed today.  Patient Risk:   After full review of this patient's clinical status, I feel that they are at least moderate risk at this time.  Time:   Today, I have spent 25 minutes with the patient with telehealth technology discussing CAD, medications, risk factor modificaiton.     Medication Adjustments/Labs and Tests Ordered: Current medicines are reviewed at length with the patient today.  Concerns regarding medicines are outlined above.  Tests  Ordered: No orders of the defined types were placed in this encounter.  Medication Changes: No orders of the defined types were placed in this encounter.   Disposition:  Follow up in 6 month(s)  Signed, Daune Perch, NP  01/18/2019 2:18 PM    [facility] [place]  [facility]

## 2019-01-18 ENCOUNTER — Other Ambulatory Visit: Payer: Self-pay

## 2019-01-18 ENCOUNTER — Telehealth (INDEPENDENT_AMBULATORY_CARE_PROVIDER_SITE_OTHER): Payer: Medicare Other | Admitting: Cardiology

## 2019-01-18 VITALS — BP 149/64 | HR 71 | Ht 69.0 in | Wt 193.0 lb

## 2019-01-18 DIAGNOSIS — I25708 Atherosclerosis of coronary artery bypass graft(s), unspecified, with other forms of angina pectoris: Secondary | ICD-10-CM | POA: Diagnosis not present

## 2019-01-18 DIAGNOSIS — E7800 Pure hypercholesterolemia, unspecified: Secondary | ICD-10-CM | POA: Diagnosis not present

## 2019-01-18 DIAGNOSIS — I2510 Atherosclerotic heart disease of native coronary artery without angina pectoris: Secondary | ICD-10-CM | POA: Insufficient documentation

## 2019-01-18 DIAGNOSIS — E119 Type 2 diabetes mellitus without complications: Secondary | ICD-10-CM

## 2019-01-18 DIAGNOSIS — I10 Essential (primary) hypertension: Secondary | ICD-10-CM

## 2019-01-18 MED ORDER — CLOPIDOGREL BISULFATE 75 MG PO TABS: 75.0000 mg | ORAL_TABLET | Freq: Every day | ORAL | 3 refills | Status: DC

## 2019-01-18 NOTE — Addendum Note (Signed)
Addended by: Mendel Ryder on: 01/18/2019 02:56 PM   Modules accepted: Orders

## 2019-01-20 IMAGING — CT CT NECK W/ CM
4 of 6 series · 13 of 33 positions shown, 15 images · IV contrast (iopamidol)
Comparison: Neck ultrasound 06/08/2018

CLINICAL DATA: Mass near right parotid artery seen on ultrasound.

EXAM:
CT NECK WITH CONTRAST
TECHNIQUE: Multidetector CT imaging of the neck was performed using the
standard protocol following the bolus administration of intravenous
contrast.
CONTRAST:  75mL ZVRTC8-7DD IOPAMIDOL (ZVRTC8-7DD) INJECTION 61%

[Series 3: neck 2.00 br40 s3 ax st · axial · 0.40mm/px · z∈[-737,-589]mm · 3 of 149 slices shown, 4 images]
[im 38/149  soft-tissue]
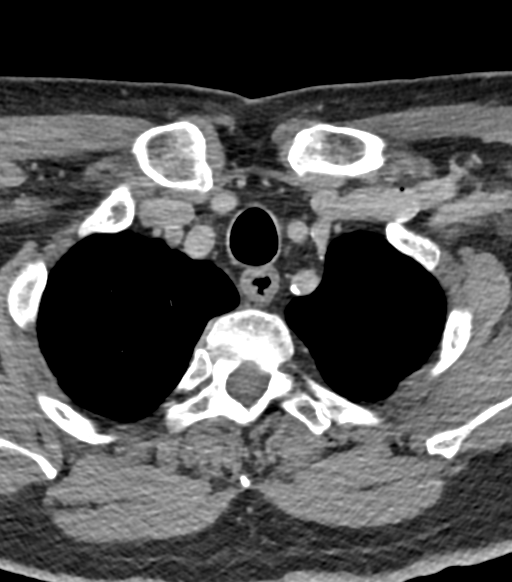
[im 38/149  bone]
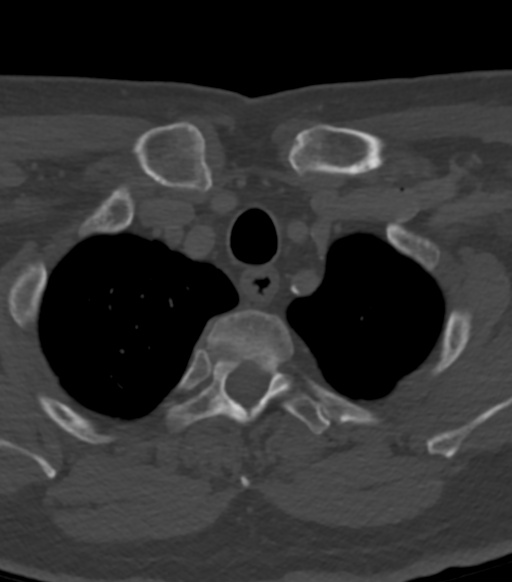
[im 75/149  bone]
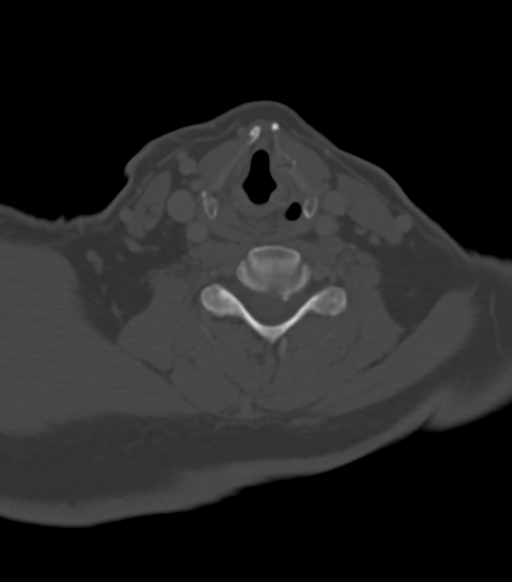
[im 112/149  bone]
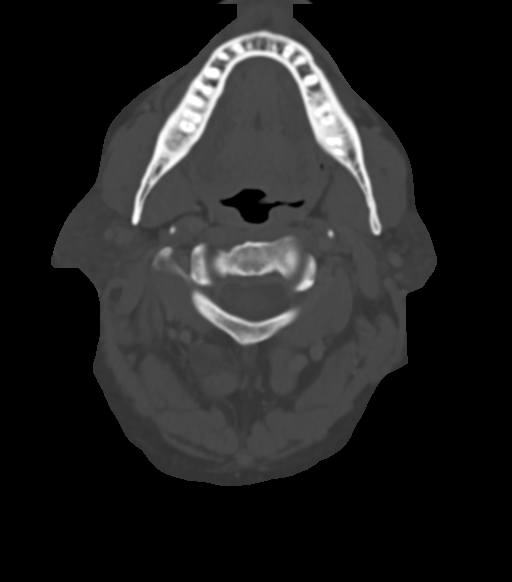

[Series 5: neck 2.00 br36 s3 ax hyoid · axial · 0.40mm/px · z∈[-743,-652]mm · 2 of 141 slices shown]
[im 47/141  bone]
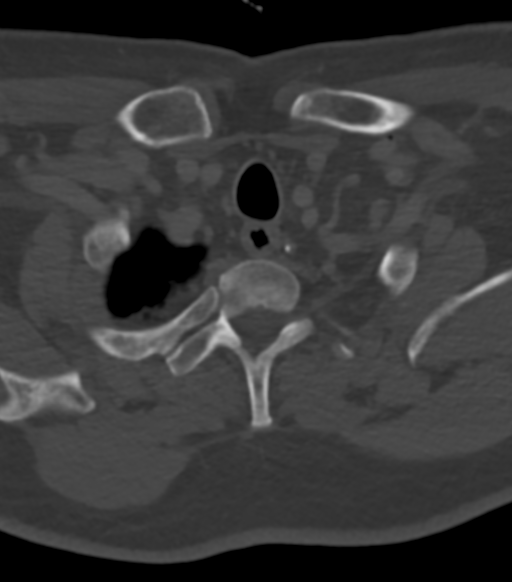
[im 94/141  bone]
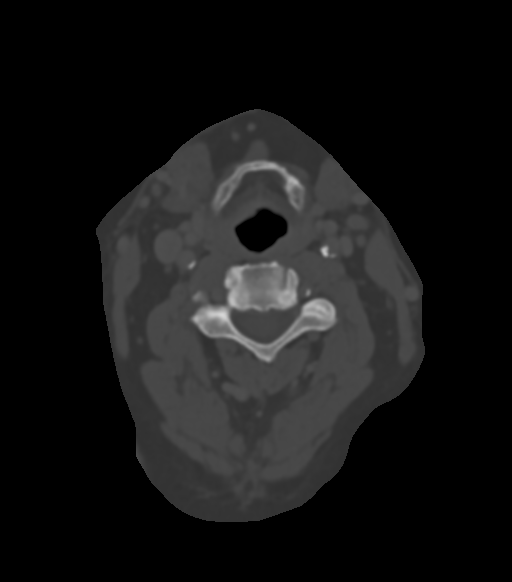

[Series 11: neck 2.00 br40 s3 (person_name) · coronal · 0.39mm/px · 3 of 104 slices shown]
[im 21/104  bone]
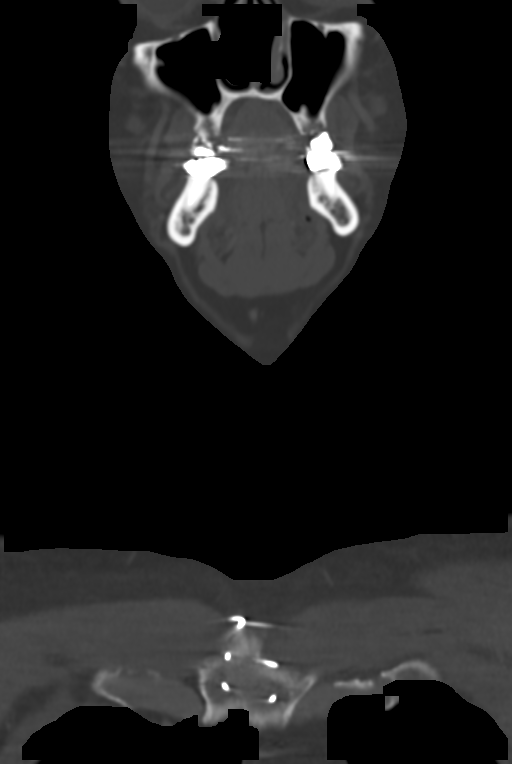
[im 42/104  bone]
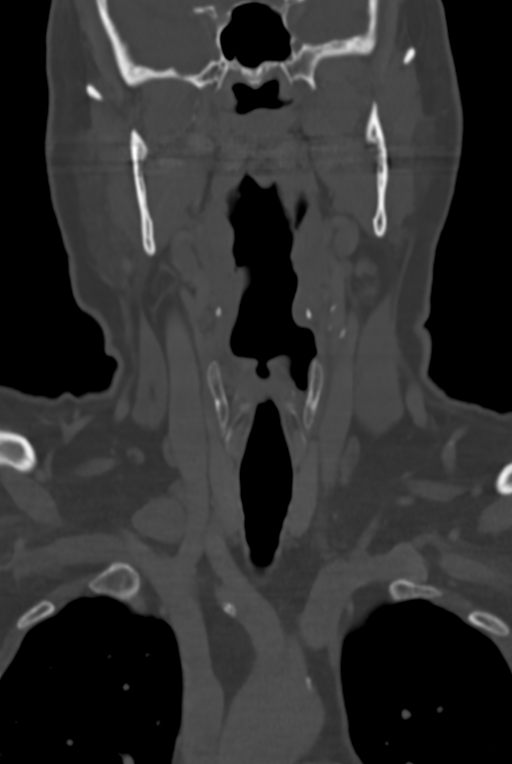
[im 62/104  bone]
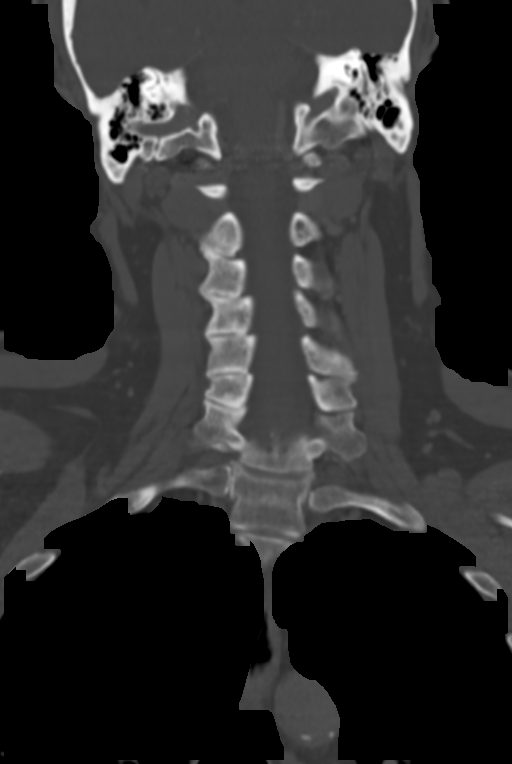

[Series 13: neck 2.00 br40 s3 sag st · sagittal · 0.45mm/px · 5 of 102 slices shown, 6 images]
[im 34/102  bone]
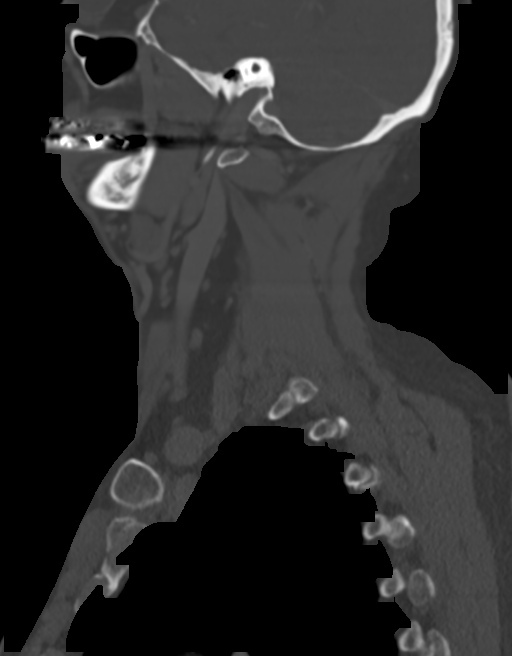
[im 43/102  bone]
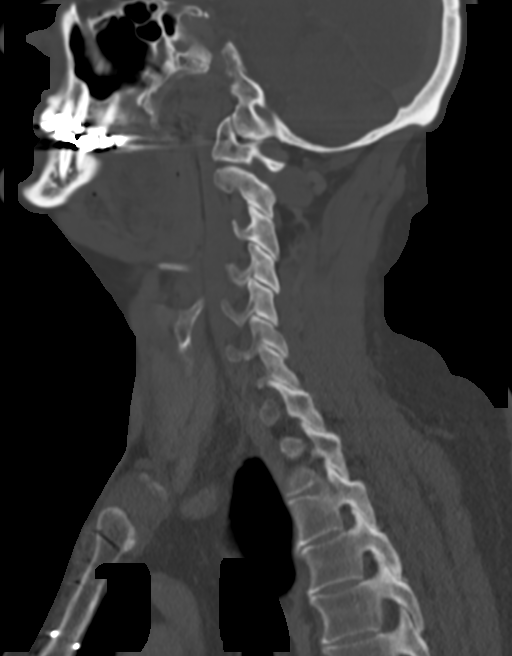
[im 51/102  soft-tissue]
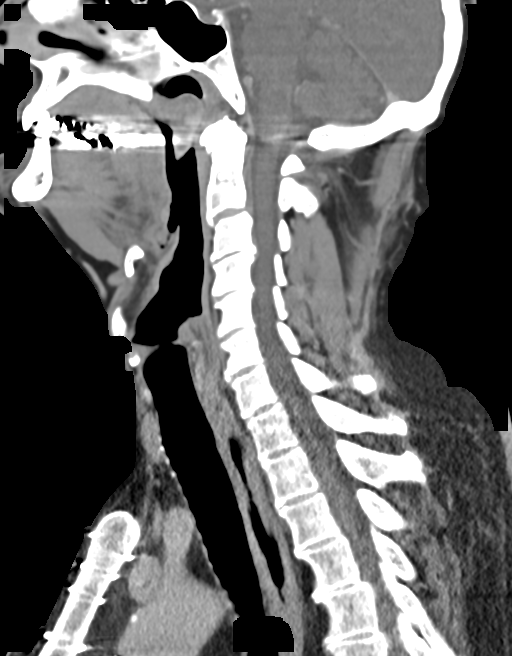
[im 51/102  bone]
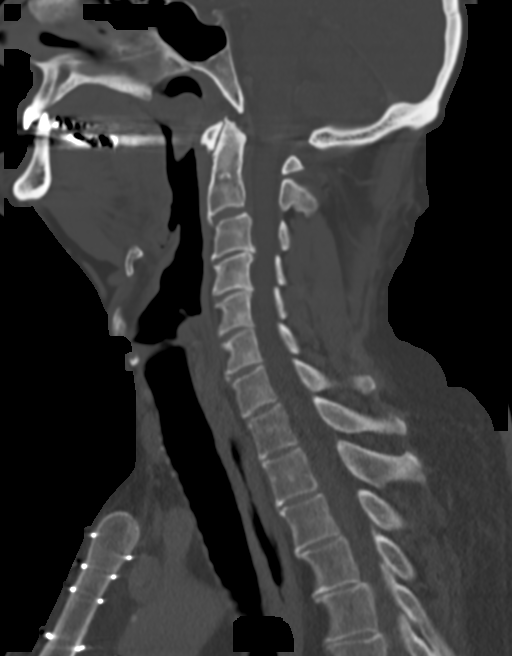
[im 59/102  bone]
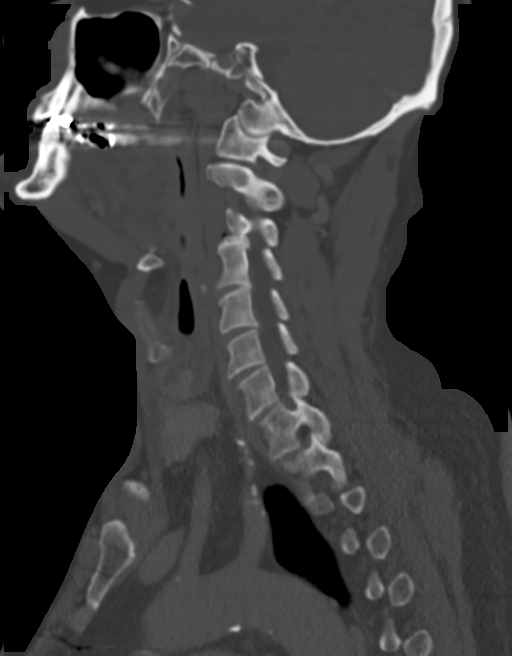
[im 68/102  bone]
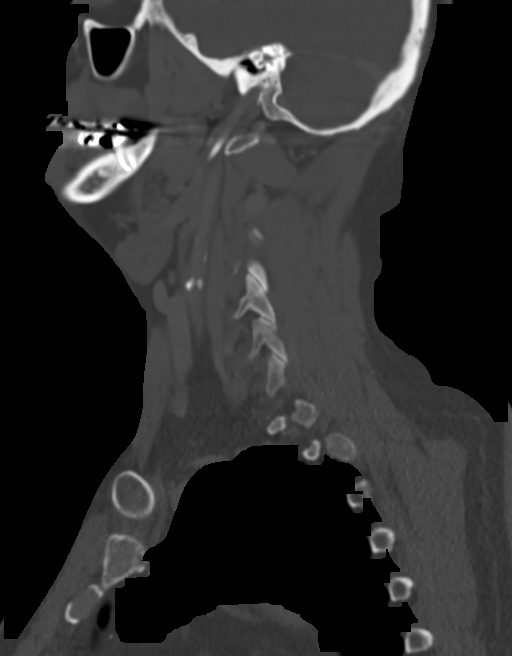

[13 of 33 positions shown; findings below may reference images not displayed]

FINDINGS: PHARYNX AND LARYNX:

--Nasopharynx: Fossae of Raje[place] are clear. Normal adenoid
tonsils for age.

--Oral cavity and oropharynx: The palatine and lingual tonsils are
normal. The visible oral cavity and floor of mouth are normal.

--Hypopharynx: Normal vallecula and pyriform sinuses.

--Larynx: Normal epiglottis and pre-epiglottic space. Normal
aryepiglottic and vocal folds.

--Retropharyngeal space: No abscess, effusion or lymphadenopathy.

SALIVARY GLANDS:

--Parotid: No mass lesion or inflammation. No sialolithiasis or
ductal dilatation.

--Submandibular: Symmetric without inflammation. No sialolithiasis
or ductal dilatation.

--Sublingual: Normal. No ranula or other visible lesion of the base
of tongue and floor of mouth.

THYROID: Normal.

LYMPH NODES: Right supraclavicular mass, corresponding to the lesion
demonstrated on ultrasound, measures 3.4 x 1.6 x 1.7 cm. No abnormal
density within the lesion, which is likely an enlarged
supraclavicular node.

VASCULAR: There is calcification at both carotid bifurcations.

LIMITED INTRACRANIAL: Normal.

VISUALIZED ORBITS: Normal.

MASTOIDS AND VISUALIZED PARANASAL SINUSES: No fluid levels or
advanced mucosal thickening. No mastoid effusion.

SKELETON: No bony spinal canal stenosis. No lytic or blastic
lesions.

UPPER CHEST: Clear.

OTHER: None.
IMPRESSION: 1. The finding on the recent ultrasound corresponds to an enlarged
right supraclavicular lymph node that measures up to 3.4 cm. There
is no necrosis or abnormal density. There are no other enlarged
lymph nodes. Finding remains of uncertain etiology.
2. Bilateral carotid bifurcation atherosclerotic calcification, as
quantified on recent ultrasound.

## 2019-01-31 NOTE — Telephone Encounter (Signed)
Per Lewayne Bunting 4/3 televisit note,  "1. CAD s/p DES to rPDA 01/11/2019 -Past hx CABG 2009 and OM graft stenosis with DES on 2 occasions.  -Pt continues on DAPT with ASA/Brilinta to be continued long term. Per Dr. [name], after 1 month of Brilinta pt will switch to Plavix starting with a 300 mg loading dose followed by 75 mg daily. I have gone over these instructions with the patient and he understands.  I will send in Rx. "

## 2019-02-01 DIAGNOSIS — Z6831 Body mass index (BMI) 31.0-31.9, adult: Secondary | ICD-10-CM | POA: Diagnosis not present

## 2019-02-01 DIAGNOSIS — E669 Obesity, unspecified: Secondary | ICD-10-CM | POA: Diagnosis not present

## 2019-02-02 ENCOUNTER — Other Ambulatory Visit: Payer: Self-pay | Admitting: Cardiovascular Disease

## 2019-02-07 DIAGNOSIS — I10 Essential (primary) hypertension: Secondary | ICD-10-CM | POA: Diagnosis not present

## 2019-02-07 DIAGNOSIS — E11649 Type 2 diabetes mellitus with hypoglycemia without coma: Secondary | ICD-10-CM | POA: Diagnosis not present

## 2019-02-07 DIAGNOSIS — E669 Obesity, unspecified: Secondary | ICD-10-CM | POA: Diagnosis not present

## 2019-02-07 DIAGNOSIS — Z794 Long term (current) use of insulin: Secondary | ICD-10-CM | POA: Diagnosis not present

## 2019-02-07 DIAGNOSIS — Z6831 Body mass index (BMI) 31.0-31.9, adult: Secondary | ICD-10-CM | POA: Diagnosis not present

## 2019-02-12 DIAGNOSIS — I10 Essential (primary) hypertension: Secondary | ICD-10-CM | POA: Diagnosis not present

## 2019-02-12 DIAGNOSIS — E11649 Type 2 diabetes mellitus with hypoglycemia without coma: Secondary | ICD-10-CM | POA: Diagnosis not present

## 2019-02-12 DIAGNOSIS — Z6831 Body mass index (BMI) 31.0-31.9, adult: Secondary | ICD-10-CM | POA: Diagnosis not present

## 2019-02-22 DIAGNOSIS — E669 Obesity, unspecified: Secondary | ICD-10-CM | POA: Diagnosis not present

## 2019-02-22 DIAGNOSIS — Z6831 Body mass index (BMI) 31.0-31.9, adult: Secondary | ICD-10-CM | POA: Diagnosis not present

## 2019-03-08 DIAGNOSIS — Z6831 Body mass index (BMI) 31.0-31.9, adult: Secondary | ICD-10-CM | POA: Diagnosis not present

## 2019-03-08 DIAGNOSIS — E669 Obesity, unspecified: Secondary | ICD-10-CM | POA: Diagnosis not present

## 2019-03-08 DIAGNOSIS — Z713 Dietary counseling and surveillance: Secondary | ICD-10-CM | POA: Diagnosis not present

## 2019-03-15 DIAGNOSIS — E7800 Pure hypercholesterolemia, unspecified: Secondary | ICD-10-CM | POA: Diagnosis not present

## 2019-03-15 DIAGNOSIS — I2510 Atherosclerotic heart disease of native coronary artery without angina pectoris: Secondary | ICD-10-CM | POA: Diagnosis not present

## 2019-03-15 DIAGNOSIS — E1165 Type 2 diabetes mellitus with hyperglycemia: Secondary | ICD-10-CM | POA: Diagnosis not present

## 2019-03-15 DIAGNOSIS — N400 Enlarged prostate without lower urinary tract symptoms: Secondary | ICD-10-CM | POA: Diagnosis not present

## 2019-03-15 DIAGNOSIS — I10 Essential (primary) hypertension: Secondary | ICD-10-CM | POA: Diagnosis not present

## 2019-03-18 DIAGNOSIS — E119 Type 2 diabetes mellitus without complications: Secondary | ICD-10-CM | POA: Diagnosis not present

## 2019-03-18 DIAGNOSIS — Z6831 Body mass index (BMI) 31.0-31.9, adult: Secondary | ICD-10-CM | POA: Diagnosis not present

## 2019-03-18 DIAGNOSIS — E669 Obesity, unspecified: Secondary | ICD-10-CM | POA: Diagnosis not present

## 2019-03-28 DIAGNOSIS — E669 Obesity, unspecified: Secondary | ICD-10-CM | POA: Diagnosis not present

## 2019-03-28 DIAGNOSIS — Z6831 Body mass index (BMI) 31.0-31.9, adult: Secondary | ICD-10-CM | POA: Diagnosis not present

## 2019-03-29 ENCOUNTER — Other Ambulatory Visit: Payer: Self-pay

## 2019-03-29 DIAGNOSIS — I6523 Occlusion and stenosis of bilateral carotid arteries: Secondary | ICD-10-CM

## 2019-04-02 ENCOUNTER — Ambulatory Visit (INDEPENDENT_AMBULATORY_CARE_PROVIDER_SITE_OTHER): Payer: Medicare Other | Admitting: Vascular Surgery

## 2019-04-02 ENCOUNTER — Ambulatory Visit (HOSPITAL_COMMUNITY)
Admission: RE | Admit: 2019-04-02 | Discharge: 2019-04-02 | Disposition: A | Discharge status: Home / Self Care | Payer: Medicare Other | Source: Ambulatory Visit | Attending: Vascular Surgery | Admitting: Vascular Surgery

## 2019-04-02 ENCOUNTER — Other Ambulatory Visit: Payer: Self-pay

## 2019-04-02 ENCOUNTER — Encounter: Payer: Self-pay | Admitting: Vascular Surgery

## 2019-04-02 VITALS — BP 113/58 | HR 60 | Temp 98.7°F | Resp 18 | Ht 69.0 in | Wt 193.0 lb

## 2019-04-02 DIAGNOSIS — I6521 Occlusion and stenosis of right carotid artery: Secondary | ICD-10-CM

## 2019-04-02 DIAGNOSIS — I6523 Occlusion and stenosis of bilateral carotid arteries: Secondary | ICD-10-CM | POA: Insufficient documentation

## 2019-04-02 NOTE — Progress Notes (Signed)
Patient name: [name] [name] MRN: [id number] DOB: [birth year] Sex: male  REASON FOR CONSULT: 6 month follow-up for carotid surveillance  HPI: [name] [name] is a 73 y.o. male with history of coronary artery disease status post CABG in 2009, diabetes, hypertension, history of NSTEMI most recent echo showed EF of 55 to 60% that was initially referred last year for right carotid stenosis.   Patient states he was being seen by his PCP and noted a bruit on the right side of his neck.  He ultimately had an ultrasound at [facility] radiology that was concerning for 70% right ICA stenosis.  A repeat ultrasound here suggested stenosis less than 50%. He presents today for six-month follow-up.  He has had no new neurologic issues in the last 6 months including numbness weakness tingling vision loss etc.  States he is has some chronic numbness in his left pinky finger that has been ongoing for years since his sternotomy and open heart surgery 10 years ago but this is actually improved over the last couple years.  Past Medical History:  Diagnosis Date  . Arrhythmia    Premature beats  . Atherosclerosis of native arteries of the extremities with intermittent claudication   . Coronary atherosclerosis of artery bypass graft    CABG 2009, LHC 07/2016 restenosis of SVG to OM treated with DES  . Diabetes mellitus ([place])   . Essential hypertension, benign   . Pure hypercholesterolemia     Past Surgical History:  Procedure Laterality Date  . ADENOIDECTOMY    . CARDIAC CATHETERIZATION N/A 07/18/2016   Procedure: Left Heart Cath and Cors/Grafts Angiography;  Surgeon: Burnell Blanks, MD;  Location: [place] [facility];  Service: Cardiovascular;  Laterality: N/A;  . CARDIAC CATHETERIZATION N/A 07/18/2016   Procedure: Coronary Stent Intervention;  Surgeon: Burnell Blanks, MD;  Location: [place] [facility];  Service: Cardiovascular;  Laterality: N/A;  . CHOLECYSTECTOMY    . CORONARY ANGIOPLASTY WITH  STENT PLACEMENT    . CORONARY ARTERY BYPASS GRAFT    . CORONARY STENT INTERVENTION N/A 01/11/2019   Procedure: CORONARY STENT INTERVENTION;  Surgeon: Sherren Mocha, MD;  Location: [place] [facility];  Service: Cardiovascular;  Laterality: N/A;  . INGUINAL HERNIA REPAIR Right   . LEFT HEART CATH AND CORS/GRAFTS ANGIOGRAPHY N/A 01/11/2019   Procedure: LEFT HEART CATH AND CORS/GRAFTS ANGIOGRAPHY;  Surgeon: Sherren Mocha, MD;  Location: [place] [facility];  Service: Cardiovascular;  Laterality: N/A;  . TONSILLECTOMY      Family History  Problem Relation Age of Onset  . Heart disease Mother        No details  . Diabetes Neg Hx     SOCIAL HISTORY: Social History   Socioeconomic History  . Marital status: Married    Spouse name: Not on file  . Number of children: Not on file  . Years of education: Not on file  . Highest education level: Not on file  Occupational History  . Not on file  Social Needs  . Financial resource strain: Not on file  . Food insecurity    Worry: Not on file    Inability: Not on file  . Transportation needs    Medical: Not on file    Non-medical: Not on file  Tobacco Use  . Smoking status: Never Smoker  . Smokeless tobacco: Never Used  Substance and Sexual Activity  . Alcohol use: No  . Drug use: No  . Sexual activity: Not on file  Lifestyle  . Physical activity    Days per week: Not on file    Minutes per session: Not on file  . Stress: Not on file  Relationships  . Social Herbalist on phone: Not on file    Gets together: Not on file    Attends religious service: Not on file    Active member of club or organization: Not on file    Attends meetings of clubs or organizations: Not on file    Relationship status: Not on file  . Intimate partner violence    Fear of current or ex partner: Not on file    Emotionally abused: Not on file    Physically abused: Not on file    Forced sexual activity: Not on file  Other Topics Concern  .  Not on file  Social History Narrative  . Not on file    No Known Allergies  Current Outpatient Medications  Medication Sig Dispense Refill  . amLODipine (NORVASC) 10 MG tablet TAKE 1 TABLET BY MOUTH EVERY DAY (Patient taking differently: Take 10 mg by mouth daily. ) 90 tablet 3  . aspirin EC 81 MG tablet Take 1 tablet (81 mg total) by mouth daily. 150 tablet 2  . benazepril (LOTENSIN) 40 MG tablet Take 40 mg by mouth daily.     Marland Kitchen BRILINTA 90 MG TABS tablet TAKE 1 TABLET (90 MG TOTAL) BY MOUTH 2 (TWO) TIMES DAILY. 60 tablet 11  . clopidogrel (PLAVIX) 75 MG tablet Take 1 tablet (75 mg total) by mouth daily. 94 tablet 3  . hydrochlorothiazide (HYDRODIURIL) 25 MG tablet TAKE 1 TABLET BY MOUTH EVERY DAY (Patient taking differently: Take 25 mg by mouth daily. ) 30 tablet 10  . isosorbide mononitrate (IMDUR) 60 MG 24 hr tablet Take 1 tablet (60 mg total) by mouth daily. 90 tablet 3  . metFORMIN (GLUCOPHAGE) 500 MG tablet Take 1,000 mg by mouth 2 (two) times daily.     . metoprolol tartrate (LOPRESSOR) 50 MG tablet TAKE 1.5 TABLETS (75 MG TOTAL) BY MOUTH 2 (TWO) TIMES DAILY. 270 tablet 1  . Multiple Vitamin (MULTIVITAMIN WITH MINERALS) TABS tablet Take 1 tablet by mouth daily.    . nitroGLYCERIN (NITROSTAT) 0.4 MG SL tablet Place 1 tablet (0.4 mg total) under the tongue every 5 (five) minutes as needed for chest pain. 25 tablet 3  . rosuvastatin (CRESTOR) 20 MG tablet TAKE 1 TABLET (20 MG TOTAL) BY MOUTH DAILY. 90 tablet 2  . glimepiride (AMARYL) 2 MG tablet Take 3 mg by mouth daily.    . insulin NPH Human (NOVOLIN N) 100 UNIT/ML injection Inject 0.2 mLs (20 Units total) into the skin every morning. And syringes 1/day (Patient not taking: Reported on 04/02/2019) 10 mL 11   No current facility-administered medications for this visit.     REVIEW OF SYSTEMS:  [X]  denotes positive finding, [ ]  denotes negative finding Cardiac  Comments:  Chest pain or chest pressure:    Shortness of breath upon  exertion:    Short of breath when lying flat:    Irregular heart rhythm:        Vascular    Pain in calf, thigh, or hip brought on by ambulation:    Pain in feet at night that wakes you up from your sleep:     Blood clot in your veins:    Leg swelling:         Pulmonary    Oxygen at home:  Productive cough:     Wheezing:         Neurologic    Sudden weakness in arms or legs:     Sudden numbness in arms or legs:     Sudden onset of difficulty speaking or slurred speech:    Temporary loss of vision in one eye:     Problems with dizziness:         Gastrointestinal    Blood in stool:     Vomited blood:         Genitourinary    Burning when urinating:     Blood in urine:        Psychiatric    Major depression:         Hematologic    Bleeding problems:    Problems with blood clotting too easily:        Skin    Rashes or ulcers:        Constitutional    Fever or chills:      PHYSICAL EXAM: Vitals:   04/02/19 1502 04/02/19 1506  BP: 129/70 (!) 113/58  Pulse: 60 60  Resp: 18   Temp: 98.7 F (37.1 C)   TempSrc: Temporal   SpO2: 98%   Weight: 193 lb (87.5 kg)   Height: 5' 9"  (1.753 m)     GENERAL: The patient is a well-nourished male, in no acute distress. The vital signs are documented above. CARDIAC: There is a regular rate and rhythm.  VASCULAR:  2+ radial pulse bilateral upper extremities. 2+ bilateral femoral pulse palpable groins 2+ palpable DP/PT BLE PULMONARY: There is good air exchange bilaterally without wheezing or rales. ABDOMEN: Soft and non-tender with normal pitched bowel sounds.  MUSCULOSKELETAL: There are no major deformities or cyanosis. NEUROLOGIC: No focal weakness or paresthesias are detected.  CN II-XII grossly intact.  DATA:   Carotid duplex: Right ICA velocity 197/23 consistent with a borderline 40 to 59% stenosis  Assessment/Plan:  73 year old male that presents with asymptomatic right ICA stenosis likely in the 40 to 59%  range.  We offered a six-month follow-up today given the discrepancy between the study done at [facility] radiology and the study done in our clinic last year.  Again on repeat duplex today his velocities are not significantly elevated to suggest a high-grade stenosis.  He has remained asymptomatic from his carotid disease.  Discussed we recommend surgery only for >80% asymptomatic carotid disease.  We will plan to see him again in one year for ongoing surveillance.  Marty Heck, MD [facility] Office: [phone] Pager: [phone]

## 2019-04-12 DIAGNOSIS — E669 Obesity, unspecified: Secondary | ICD-10-CM | POA: Diagnosis not present

## 2019-04-12 DIAGNOSIS — Z6831 Body mass index (BMI) 31.0-31.9, adult: Secondary | ICD-10-CM | POA: Diagnosis not present

## 2019-04-12 DIAGNOSIS — I10 Essential (primary) hypertension: Secondary | ICD-10-CM | POA: Diagnosis not present

## 2019-04-18 ENCOUNTER — Other Ambulatory Visit: Payer: Self-pay

## 2019-04-23 ENCOUNTER — Other Ambulatory Visit: Payer: Self-pay

## 2019-04-23 ENCOUNTER — Ambulatory Visit (INDEPENDENT_AMBULATORY_CARE_PROVIDER_SITE_OTHER): Payer: Medicare Other | Admitting: Endocrinology

## 2019-04-23 ENCOUNTER — Encounter: Payer: Self-pay | Admitting: Endocrinology

## 2019-04-23 VITALS — BP 142/80 | HR 67 | Ht 69.0 in | Wt 193.4 lb

## 2019-04-23 DIAGNOSIS — E119 Type 2 diabetes mellitus without complications: Secondary | ICD-10-CM | POA: Diagnosis not present

## 2019-04-23 DIAGNOSIS — I6523 Occlusion and stenosis of bilateral carotid arteries: Secondary | ICD-10-CM

## 2019-04-23 DIAGNOSIS — I10 Essential (primary) hypertension: Secondary | ICD-10-CM

## 2019-04-23 DIAGNOSIS — E1159 Type 2 diabetes mellitus with other circulatory complications: Secondary | ICD-10-CM | POA: Diagnosis not present

## 2019-04-23 LAB — POCT GLYCOSYLATED HEMOGLOBIN (HGB A1C): Hemoglobin A1C: 6.9 % — AB (ref 4.0–5.6)

## 2019-04-23 NOTE — Progress Notes (Signed)
Subjective:    Patient ID: [name] [name], male    DOB: 10-10-1946, 73 y.o.   MRN: [id number]  HPI Pt returns for f/u of diabetes mellitus:  DM type: Insulin-requiring type 2.  Dx'ed: 5188 Complications: CAD Therapy: insulin since 2019.   DKA: never Severe hypoglycemia: never Pancreatitis: never Pancreatic imaging: never Other: he declines multiple daily injections; he declines non-insulin rx, and insulin analogs, due to cost; he took insulin in 2019-2020, but removed the need by losing weight.   Interval history:  no cbg record, but states fasting cbg's vary from 114-152.  It is still lowest in the afternoon. pt states he feels well in general.  He has lost more weight, due to his efforts.  He takes metformin only.     Past Medical History:  Diagnosis Date  . Arrhythmia    Premature beats  . Atherosclerosis of native arteries of the extremities with intermittent claudication   . Coronary atherosclerosis of artery bypass graft    CABG 2009, LHC 07/2016 restenosis of SVG to OM treated with DES  . Diabetes mellitus ([place])   . Essential hypertension, benign   . Pure hypercholesterolemia     Past Surgical History:  Procedure Laterality Date  . ADENOIDECTOMY    . CARDIAC CATHETERIZATION N/A 07/18/2016   Procedure: Left Heart Cath and Cors/Grafts Angiography;  Surgeon: Burnell Blanks, MD;  Location: [facility];  Service: Cardiovascular;  Laterality: N/A;  . CARDIAC CATHETERIZATION N/A 07/18/2016   Procedure: Coronary Stent Intervention;  Surgeon: Burnell Blanks, MD;  Location: [facility];  Service: Cardiovascular;  Laterality: N/A;  . CHOLECYSTECTOMY    . CORONARY ANGIOPLASTY WITH STENT PLACEMENT    . CORONARY ARTERY BYPASS GRAFT    . CORONARY STENT INTERVENTION N/A 01/11/2019   Procedure: CORONARY STENT INTERVENTION;  Surgeon: Sherren Mocha, MD;  Location: [facility];  Service: Cardiovascular;  Laterality: N/A;  . INGUINAL HERNIA REPAIR Right    . LEFT HEART CATH AND CORS/GRAFTS ANGIOGRAPHY N/A 01/11/2019   Procedure: LEFT HEART CATH AND CORS/GRAFTS ANGIOGRAPHY;  Surgeon: Sherren Mocha, MD;  Location: [facility];  Service: Cardiovascular;  Laterality: N/A;  . TONSILLECTOMY      Social History   Socioeconomic History  . Marital status: Married    Spouse name: Not on file  . Number of children: Not on file  . Years of education: Not on file  . Highest education level: Not on file  Occupational History  . Not on file  Social Needs  . Financial resource strain: Not on file  . Food insecurity    Worry: Not on file    Inability: Not on file  . Transportation needs    Medical: Not on file    Non-medical: Not on file  Tobacco Use  . Smoking status: Never Smoker  . Smokeless tobacco: Never Used  Substance and Sexual Activity  . Alcohol use: No  . Drug use: No  . Sexual activity: Not on file  Lifestyle  . Physical activity    Days per week: Not on file    Minutes per session: Not on file  . Stress: Not on file  Relationships  . Social Herbalist on phone: Not on file    Gets together: Not on file    Attends religious service: Not on file    Active member of club or organization: Not on file    Attends meetings of clubs  or organizations: Not on file    Relationship status: Not on file  . Intimate partner violence    Fear of current or ex partner: Not on file    Emotionally abused: Not on file    Physically abused: Not on file    Forced sexual activity: Not on file  Other Topics Concern  . Not on file  Social History Narrative  . Not on file    Current Outpatient Medications on File Prior to Visit  Medication Sig Dispense Refill  . amLODipine (NORVASC) 10 MG tablet TAKE 1 TABLET BY MOUTH EVERY DAY (Patient taking differently: Take 10 mg by mouth daily. ) 90 tablet 3  . aspirin EC 81 MG tablet Take 1 tablet (81 mg total) by mouth daily. 150 tablet 2  . benazepril (LOTENSIN) 40 MG tablet Take  40 mg by mouth daily.     Marland Kitchen BRILINTA 90 MG TABS tablet TAKE 1 TABLET (90 MG TOTAL) BY MOUTH 2 (TWO) TIMES DAILY. 60 tablet 11  . clopidogrel (PLAVIX) 75 MG tablet Take 1 tablet (75 mg total) by mouth daily. 94 tablet 3  . hydrochlorothiazide (HYDRODIURIL) 25 MG tablet TAKE 1 TABLET BY MOUTH EVERY DAY (Patient taking differently: Take 25 mg by mouth daily. ) 30 tablet 10  . isosorbide mononitrate (IMDUR) 60 MG 24 hr tablet Take 1 tablet (60 mg total) by mouth daily. 90 tablet 3  . metFORMIN (GLUCOPHAGE) 500 MG tablet Take 1,000 mg by mouth 2 (two) times daily.     . metoprolol tartrate (LOPRESSOR) 50 MG tablet TAKE 1.5 TABLETS (75 MG TOTAL) BY MOUTH 2 (TWO) TIMES DAILY. 270 tablet 1  . Multiple Vitamin (MULTIVITAMIN WITH MINERALS) TABS tablet Take 1 tablet by mouth daily.    . nitroGLYCERIN (NITROSTAT) 0.4 MG SL tablet Place 1 tablet (0.4 mg total) under the tongue every 5 (five) minutes as needed for chest pain. 25 tablet 3  . rosuvastatin (CRESTOR) 20 MG tablet TAKE 1 TABLET (20 MG TOTAL) BY MOUTH DAILY. 90 tablet 2   No current facility-administered medications on file prior to visit.     No Known Allergies  Family History  Problem Relation Age of Onset  . Heart disease Mother        No details  . Diabetes Neg Hx     BP (!) 142/80 (BP Location: Left Arm, Patient Position: Sitting, Cuff Size: Normal)   Pulse 67   Ht 5' 9"  (1.753 m)   Wt 193 lb 6.4 oz (87.7 kg)   SpO2 93%   BMI 28.56 kg/m   Review of Systems He has lost 6 more lbs, since last ov.      Objective:   Physical Exam VITAL SIGNS:  See vs page GENERAL: no distress Pulses: dorsalis pedis intact bilat.   MSK: no deformity of the feet CV: no leg edema Skin:  no ulcer on the feet.  normal color and temp on the feet. Neuro: sensation is intact to touch on the feet  Lab Results  Component Value Date   HGBA1C 6.9 (A) 04/23/2019   Lab Results  Component Value Date   CREATININE 1.02 01/11/2019   BUN 15 01/11/2019    NA 137 01/11/2019   K 4.0 01/11/2019   CL 103 01/11/2019   CO2 23 01/11/2019        Assessment & Plan:  Type 2 DM, with CAD: well-controlled HTN: is noted today.   Weight loss, intentional: this is helping glycemic control.  Patient Instructions  Your blood pressure is high today.  Please see your primary care provider soon, to have it rechecked Please continue your weight loss efforts Please continue the same metformin.   check your blood sugar twice a day.  vary the time of day when you check, between before the 3 meals, and at bedtime.  also check if you have symptoms of your blood sugar being too high or too low.  please keep a record of the readings and bring it to your next appointment here (or you can bring the meter itself).  You can write it on any piece of paper.  please call us sooner if your blood sugar goes below 70, or if you have a lot of readings over 200. Please come back for a follow-up appointment in 3 months.

## 2019-04-23 NOTE — Patient Instructions (Addendum)
Your blood pressure is high today.  Please see your primary care provider soon, to have it rechecked Please continue your weight loss efforts Please continue the same metformin.   check your blood sugar twice a day.  vary the time of day when you check, between before the 3 meals, and at bedtime.  also check if you have symptoms of your blood sugar being too high or too low.  please keep a record of the readings and bring it to your next appointment here (or you can bring the meter itself).  You can write it on any piece of paper.  please call us sooner if your blood sugar goes below 70, or if you have a lot of readings over 200. Please come back for a follow-up appointment in 3 months.

## 2019-04-26 DIAGNOSIS — E669 Obesity, unspecified: Secondary | ICD-10-CM | POA: Diagnosis not present

## 2019-04-26 DIAGNOSIS — E119 Type 2 diabetes mellitus without complications: Secondary | ICD-10-CM | POA: Diagnosis not present

## 2019-04-26 DIAGNOSIS — Z6831 Body mass index (BMI) 31.0-31.9, adult: Secondary | ICD-10-CM | POA: Diagnosis not present

## 2019-04-26 DIAGNOSIS — I10 Essential (primary) hypertension: Secondary | ICD-10-CM | POA: Diagnosis not present

## 2019-05-10 DIAGNOSIS — Z6831 Body mass index (BMI) 31.0-31.9, adult: Secondary | ICD-10-CM | POA: Diagnosis not present

## 2019-05-10 DIAGNOSIS — E669 Obesity, unspecified: Secondary | ICD-10-CM | POA: Diagnosis not present

## 2019-05-10 DIAGNOSIS — Z7182 Exercise counseling: Secondary | ICD-10-CM | POA: Diagnosis not present

## 2019-05-24 DIAGNOSIS — E669 Obesity, unspecified: Secondary | ICD-10-CM | POA: Diagnosis not present

## 2019-05-24 DIAGNOSIS — Z6831 Body mass index (BMI) 31.0-31.9, adult: Secondary | ICD-10-CM | POA: Diagnosis not present

## 2019-06-07 DIAGNOSIS — Z0000 Encounter for general adult medical examination without abnormal findings: Secondary | ICD-10-CM | POA: Diagnosis not present

## 2019-06-10 DIAGNOSIS — E1165 Type 2 diabetes mellitus with hyperglycemia: Secondary | ICD-10-CM | POA: Diagnosis not present

## 2019-06-10 DIAGNOSIS — Z955 Presence of coronary angioplasty implant and graft: Secondary | ICD-10-CM | POA: Diagnosis not present

## 2019-06-10 DIAGNOSIS — I10 Essential (primary) hypertension: Secondary | ICD-10-CM | POA: Diagnosis not present

## 2019-06-10 DIAGNOSIS — I2510 Atherosclerotic heart disease of native coronary artery without angina pectoris: Secondary | ICD-10-CM | POA: Diagnosis not present

## 2019-06-10 DIAGNOSIS — I779 Disorder of arteries and arterioles, unspecified: Secondary | ICD-10-CM | POA: Diagnosis not present

## 2019-06-10 DIAGNOSIS — E7800 Pure hypercholesterolemia, unspecified: Secondary | ICD-10-CM | POA: Diagnosis not present

## 2019-06-16 ENCOUNTER — Other Ambulatory Visit: Payer: Self-pay | Admitting: Cardiovascular Disease

## 2019-06-21 ENCOUNTER — Other Ambulatory Visit: Payer: Self-pay | Admitting: Physician Assistant

## 2019-06-21 DIAGNOSIS — Z713 Dietary counseling and surveillance: Secondary | ICD-10-CM | POA: Diagnosis not present

## 2019-06-21 DIAGNOSIS — Z6831 Body mass index (BMI) 31.0-31.9, adult: Secondary | ICD-10-CM | POA: Diagnosis not present

## 2019-06-21 DIAGNOSIS — E669 Obesity, unspecified: Secondary | ICD-10-CM | POA: Diagnosis not present

## 2019-07-08 NOTE — Progress Notes (Signed)
Cardiology Office Note:    Date:  07/09/2019   ID:  [name] [name], [name] [name] 09-Mar-1946, MRN [id number]  PCP:  Leighton Ruff, MD  Cardiologist:  Sherren Mocha, MD  Electrophysiologist:  None  Vascular surgeon: Dr. Carlis Abbott  Referring MD: Leighton Ruff, MD   Chief Complaint  Patient presents with  . Follow-up    CAD    History of Present Illness:    [name] [name] is a 73 y.o. male with:  Coronary artery disease   S/p CABG in 2009  s/p NSTEMI soon after surgery in 2009: PCI to distal SVG  Cath in 2012: patent bypass grafts  Unstable angina 10/17 >> PCI: DES to Dist S-OM2; all other grafts patent  Cath 12/2018: patent grafts; oRPDA 95 >> PCI: DES to RPDA   Aortic stenosis  Echocardiogram 8/17: mean 11 mmHg  Hypertension   Hyperlipidemia   Diabetes mellitus   PAD  Carotid Artery Disease   Mr. Ruddy was admitted with crescendo angina in 12/2018.  Cardiac catheterization demonstrated patent bypass grafts and high grade RPDA stenosis beyond the graft which was tx with a DES.  He was last seen by Daune Perch, NP in 01/2019 for follow up.    He returns for follow up.  He is here alone.  Since last seen, he has done well.  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  He has not had chest pain, shortness of breath, syncope, paroxysmal nocturnal dyspnea, leg swelling.    Prior CV studies:   The following studies were reviewed today:  Carotid US 04/02/2019 Summary: Right Carotid: Velocities in the right ICA are consistent with a 1-39% stenosis.  Left Carotid: Velocities in the left ICA are consistent with a 1-39% stenosis.   Cardiac catheterization 01/11/2019 LAD ost 100 (CTO), dist 70 LCx ost 99 RCA prox 100, dist 40; RPDA ost 95 S-RCA patent S-OM2 stent patent S-D1 patent L-LAD patent PCI:  2.25 x 16 mm Synergy DES to ost RPDA      Carotid US 06/08/18 IMPRESSION: Extensive atherosclerotic disease in the  bilateral carotid arteries, particularly at the right carotid bulb and proximal right internal carotid artery.  Estimated degree of stenosis in the proximal right internal carotid artery is greater than 70%. There is also stenosis at the right carotid bulb.  Estimated degree of stenosis in left internal carotid artery is less than 50%.  **An incidental finding of potential clinical significance has been found. Hypoechoic nodule lateral to the right carotid vessels measures up to 2.6 cm and appears to represent an enlarged lymph node. Findings are nonspecific but consider further characterization with contrast enhanced CT of the neck.**   LHC 07/18/16 LAD ostial 100, distal 70 LCx ostial 99 RCA proximal 30, mid 60, distal 40 SVG-RCA normal SVG-OM2 distal 99 ISR SVG-D1 normal LIMA-LAD normal PCI: 2.5 x 12 mm Promus Premier DES to distal SVG-OM2 1. 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  2. The LAD is occluded proximally. The LIMA graft is patent to the mid LAD and fills the mid and distal vessel. The patent vein graft fills the diagonal branch.  3. The Circumflex is occluded proximally. The OM branch fills from a patent vein graft. The vein graft has severe restenosis in the stented segment at the anastomosis of the vein graft to the OM.  4. The RCA has moderate disease throughout. There is competitive flow distally from the patent vein  graft to the distal RCA.  5. Successful PTCA/DES x 1 distal body of SVG to OM.   Echo 05/30/16 - Left ventricle: The cavity size was normal. Wall thickness was increased in a pattern of mild LVH. Systolic function was normal. The estimated ejection fraction was in the range of 55% to 60%. Wall motion was normal; there were no regional wall motion abnormalities. Doppler parameters are consistent with abnormal left ventricular relaxation (grade 1 diastolic dysfunction). - Aortic valve: There was mild  stenosis.  Mean 11 mmHg, peak 21 mmHg  Myoview 10/13 There is no sign of scar or ischemia. LV Ejection Fraction: 66%. LV Wall Motion: Normal.  Past Medical History:  Diagnosis Date  . Arrhythmia    Premature beats  . Atherosclerosis of native arteries of the extremities with intermittent claudication   . Coronary atherosclerosis of artery bypass graft    CABG 2009, LHC 07/2016 restenosis of SVG to OM treated with DES  . Diabetes mellitus ([place])   . Essential hypertension, benign   . Pure hypercholesterolemia    Surgical Hx: The patient  has a past surgical history that includes Coronary artery bypass graft; Cholecystectomy; Tonsillectomy; Adenoidectomy; Coronary angioplasty with stent; Inguinal hernia repair (Right); Cardiac catheterization (N/A, 07/18/2016); Cardiac catheterization (N/A, 07/18/2016); LEFT HEART CATH AND CORS/GRAFTS ANGIOGRAPHY (N/A, 01/11/2019); and CORONARY STENT INTERVENTION (N/A, 01/11/2019).   Current Medications: Current Meds  Medication Sig  . amLODipine (NORVASC) 10 MG tablet TAKE 1 TABLET BY MOUTH EVERY DAY  . aspirin EC 81 MG tablet Take 1 tablet (81 mg total) by mouth daily.  . benazepril (LOTENSIN) 40 MG tablet Take 40 mg by mouth daily.   . clopidogrel (PLAVIX) 75 MG tablet Take 1 tablet (75 mg total) by mouth daily.  . hydrochlorothiazide (HYDRODIURIL) 25 MG tablet TAKE 1 TABLET BY MOUTH EVERY DAY  . isosorbide mononitrate (IMDUR) 60 MG 24 hr tablet Take 1 tablet (60 mg total) by mouth daily.  . metFORMIN (GLUCOPHAGE) 1000 MG tablet Take 1,000 mg by mouth 2 (two) times daily with a meal.  . metoprolol tartrate (LOPRESSOR) 50 MG tablet TAKE 1.5 TABLETS (75 MG TOTAL) BY MOUTH 2 (TWO) TIMES DAILY.  . Multiple Vitamin (MULTIVITAMIN WITH MINERALS) TABS tablet Take 1 tablet by mouth daily.  . nitroGLYCERIN (NITROSTAT) 0.4 MG SL tablet Place 1 tablet (0.4 mg total) under the tongue every 5 (five) minutes as needed for chest pain.  . rosuvastatin (CRESTOR) 20 MG  tablet TAKE 1 TABLET (20 MG TOTAL) BY MOUTH DAILY.     Allergies:   Patient has no known allergies.   Social History   Tobacco Use  . Smoking status: Never Smoker  . Smokeless tobacco: Never Used  Substance Use Topics  . Alcohol use: No  . Drug use: No     Family Hx: The patient's family history includes Heart disease in his mother. There is no history of Diabetes.  ROS:   Please see the history of present illness.    Review of Systems  Constitution: Negative for fever.  Gastrointestinal: Negative for hematochezia.   All other systems reviewed and are negative.   EKGs/Labs/Other Test Reviewed:    EKG:  EKG is  ordered today.  The ekg ordered today demonstrates normal sinus rhythm, heart rate 70, normal axis, no ST-T wave changes, QTC 423, no change from prior tracing  Recent Labs: 01/11/2019: BUN 15; Creatinine, Ser 1.02; Hemoglobin 13.5; Platelets 297; Potassium 4.0; Sodium 137   Recent Lipid Panel Lab Results  Component Value Date/Time   CHOL 109 07/18/2016 12:37 AM   TRIG 220 (H) 07/18/2016 12:37 AM   HDL 25 (L) 07/18/2016 12:37 AM   CHOLHDL 4.4 07/18/2016 12:37 AM   LDLCALC 40 07/18/2016 12:37 AM     Physical Exam:    VS:  BP 134/72   Pulse 70   Ht 5' 9"  (1.753 m)   Wt 197 lb (89.4 kg)   BMI 29.09 kg/m     Wt Readings from Last 3 Encounters:  07/09/19 197 lb (89.4 kg)  04/23/19 193 lb 6.4 oz (87.7 kg)  04/02/19 193 lb (87.5 kg)     Physical Exam  Constitutional: He is oriented to person, place, and time. He appears well-developed and well-nourished. No distress.  HENT:  Head: Normocephalic and atraumatic.  Eyes: No scleral icterus.  Neck: No JVD present. No thyromegaly present.  Cardiovascular: Normal rate and regular rhythm.  Murmur heard.  Crescendo-decrescendo systolic murmur is present with a grade of 3/6 at the upper right sternal border. Pulmonary/Chest: Effort normal and breath sounds normal. He has no rales.  Abdominal: Soft. There is no  hepatomegaly.  Musculoskeletal:        General: No edema.  Lymphadenopathy:    He has no cervical adenopathy.  Neurological: He is alert and oriented to person, place, and time.  Skin: Skin is warm and dry.  Psychiatric: He has a normal mood and affect.    ASSESSMENT & PLAN:    1. Coronary artery disease of bypass graft of native heart with stable angina pectoris ([place]) Hx of CABG in 2009 and NSTEMI soon after surgery tx with PCI to one of the SVGs.  He is s/p DES to S-OM2 in 2017 and most recently underwent PCI with DES to the [place] in 12/2018.  He is currently without anginal symptoms on his current medical regimen which includes Amlodipine, ASA, Plavix, Isosorbide, Metoprolol Tartrate, Rosuvastatin.  Continue current medical Rx.  Follow up in 6 mos with Dr. [name].   2. Nonrheumatic aortic valve stenosis Mild by Echocardiogram in 2017.  Mean gradient then was 11.  I will arrange a follow up echocardiogram prior to his next appt in 6 mos.   3. Essential hypertension The patient's blood pressure is controlled on his current regimen.  Continue current therapy.    4. Hyperlipidemia, unspecified hyperlipidemia type LDL optimal on most recent lab work.  Continue current Rx.    5. Type 2 diabetes mellitus without complication, without long-term current use of insulin ([facility]) Most recent A1c 6.9.  6. Bilateral carotid artery disease, unspecified type ([place]) Followed by vascular surgery.  Continue antiplatelet therapy and statin therapy.   Dispo:  Return in about 6 months (around 01/06/2020) for Routine Follow Up, w/ Dr. [name], (virtual or in-person).   Medication Adjustments/Labs and Tests Ordered: Current medicines are reviewed at length with the patient today.  Concerns regarding medicines are outlined above.  Tests Ordered: No orders of the defined types were placed in this encounter.  Medication Changes: No orders of the defined types were placed in this encounter.   Signed, Richardson Dopp, PA-C  07/09/2019 11:42 AM    [facility] [address], [place], [place]  [address] Phone: [phone]; Fax: [phone]

## 2019-07-09 ENCOUNTER — Other Ambulatory Visit: Payer: Self-pay

## 2019-07-09 ENCOUNTER — Ambulatory Visit (INDEPENDENT_AMBULATORY_CARE_PROVIDER_SITE_OTHER): Payer: Medicare Other | Admitting: Physician Assistant

## 2019-07-09 ENCOUNTER — Encounter: Payer: Self-pay | Admitting: Physician Assistant

## 2019-07-09 VITALS — BP 134/72 | HR 70 | Ht 69.0 in | Wt 197.0 lb

## 2019-07-09 DIAGNOSIS — E785 Hyperlipidemia, unspecified: Secondary | ICD-10-CM | POA: Diagnosis not present

## 2019-07-09 DIAGNOSIS — I739 Peripheral vascular disease, unspecified: Secondary | ICD-10-CM

## 2019-07-09 DIAGNOSIS — I350 Nonrheumatic aortic (valve) stenosis: Secondary | ICD-10-CM

## 2019-07-09 DIAGNOSIS — I25708 Atherosclerosis of coronary artery bypass graft(s), unspecified, with other forms of angina pectoris: Secondary | ICD-10-CM | POA: Diagnosis not present

## 2019-07-09 DIAGNOSIS — I6523 Occlusion and stenosis of bilateral carotid arteries: Secondary | ICD-10-CM | POA: Diagnosis not present

## 2019-07-09 DIAGNOSIS — I10 Essential (primary) hypertension: Secondary | ICD-10-CM

## 2019-07-09 DIAGNOSIS — E119 Type 2 diabetes mellitus without complications: Secondary | ICD-10-CM

## 2019-07-09 DIAGNOSIS — I779 Disorder of arteries and arterioles, unspecified: Secondary | ICD-10-CM

## 2019-07-09 NOTE — Patient Instructions (Signed)
Medication Instructions:   Your physician recommends that you continue on your current medications as directed. Please refer to the Current Medication list given to you today.  If you need a refill on your cardiac medications before your next appointment, please call your pharmacy.   Lab work:  None ordered today  Testing/Procedures:  Your physician has requested that you have an echocardiogram. Echocardiography is a painless test that uses sound waves to create images of your heart. It provides your doctor with information about the size and shape of your heart and how well your heart's chambers and valves are working. This procedure takes approximately one hour. There are no restrictions for this procedure.  Follow-Up: At [facility], you and your health needs are our priority.  As part of our continuing mission to provide you with exceptional heart care, we have created designated Provider Care Teams.  These Care Teams include your primary Cardiologist (physician) and Advanced Practice Providers (APPs -  Physician Assistants and Nurse Practitioners) who all work together to provide you with the care you need, when you need it. You will need a follow up appointment in:  6 months.  Please call our office 2 months in advance to schedule this appointment.  You may see Sherren Mocha, MD or one of the following Advanced Practice Providers on your designated Care Team: Richardson Dopp, PA-C [place], [place] . Daune Perch, NP

## 2019-07-16 DIAGNOSIS — I10 Essential (primary) hypertension: Secondary | ICD-10-CM | POA: Diagnosis not present

## 2019-07-16 DIAGNOSIS — E119 Type 2 diabetes mellitus without complications: Secondary | ICD-10-CM | POA: Diagnosis not present

## 2019-07-16 DIAGNOSIS — E669 Obesity, unspecified: Secondary | ICD-10-CM | POA: Diagnosis not present

## 2019-07-16 DIAGNOSIS — Z6831 Body mass index (BMI) 31.0-31.9, adult: Secondary | ICD-10-CM | POA: Diagnosis not present

## 2019-07-24 ENCOUNTER — Ambulatory Visit: Payer: Medicare Other | Admitting: Endocrinology

## 2019-08-01 ENCOUNTER — Other Ambulatory Visit: Payer: Self-pay | Admitting: Cardiovascular Disease

## 2019-08-06 DIAGNOSIS — I10 Essential (primary) hypertension: Secondary | ICD-10-CM | POA: Diagnosis not present

## 2019-08-06 DIAGNOSIS — Z6831 Body mass index (BMI) 31.0-31.9, adult: Secondary | ICD-10-CM | POA: Diagnosis not present

## 2019-08-06 DIAGNOSIS — E669 Obesity, unspecified: Secondary | ICD-10-CM | POA: Diagnosis not present

## 2019-08-16 ENCOUNTER — Other Ambulatory Visit: Payer: Self-pay | Admitting: Cardiovascular Disease

## 2019-09-20 DIAGNOSIS — Z6830 Body mass index (BMI) 30.0-30.9, adult: Secondary | ICD-10-CM | POA: Diagnosis not present

## 2019-09-20 DIAGNOSIS — E669 Obesity, unspecified: Secondary | ICD-10-CM | POA: Diagnosis not present

## 2019-09-20 DIAGNOSIS — Z713 Dietary counseling and surveillance: Secondary | ICD-10-CM | POA: Diagnosis not present

## 2019-09-25 DIAGNOSIS — Z961 Presence of intraocular lens: Secondary | ICD-10-CM | POA: Diagnosis not present

## 2019-09-25 DIAGNOSIS — Z9842 Cataract extraction status, left eye: Secondary | ICD-10-CM | POA: Diagnosis not present

## 2019-09-25 DIAGNOSIS — H35371 Puckering of macula, right eye: Secondary | ICD-10-CM | POA: Diagnosis not present

## 2019-09-25 DIAGNOSIS — Z9841 Cataract extraction status, right eye: Secondary | ICD-10-CM | POA: Diagnosis not present

## 2019-09-25 DIAGNOSIS — E119 Type 2 diabetes mellitus without complications: Secondary | ICD-10-CM | POA: Diagnosis not present

## 2019-10-16 ENCOUNTER — Other Ambulatory Visit: Payer: Self-pay | Admitting: Cardiovascular Disease

## 2019-10-21 DIAGNOSIS — E669 Obesity, unspecified: Secondary | ICD-10-CM | POA: Diagnosis not present

## 2019-10-21 DIAGNOSIS — Z6831 Body mass index (BMI) 31.0-31.9, adult: Secondary | ICD-10-CM | POA: Diagnosis not present

## 2019-11-15 DIAGNOSIS — Z6831 Body mass index (BMI) 31.0-31.9, adult: Secondary | ICD-10-CM | POA: Diagnosis not present

## 2019-11-15 DIAGNOSIS — N400 Enlarged prostate without lower urinary tract symptoms: Secondary | ICD-10-CM | POA: Diagnosis not present

## 2019-11-15 DIAGNOSIS — I2510 Atherosclerotic heart disease of native coronary artery without angina pectoris: Secondary | ICD-10-CM | POA: Diagnosis not present

## 2019-11-15 DIAGNOSIS — E669 Obesity, unspecified: Secondary | ICD-10-CM | POA: Diagnosis not present

## 2019-11-15 DIAGNOSIS — E1165 Type 2 diabetes mellitus with hyperglycemia: Secondary | ICD-10-CM | POA: Diagnosis not present

## 2019-11-15 DIAGNOSIS — I10 Essential (primary) hypertension: Secondary | ICD-10-CM | POA: Diagnosis not present

## 2019-11-15 DIAGNOSIS — E119 Type 2 diabetes mellitus without complications: Secondary | ICD-10-CM | POA: Diagnosis not present

## 2019-11-15 DIAGNOSIS — E7800 Pure hypercholesterolemia, unspecified: Secondary | ICD-10-CM | POA: Diagnosis not present

## 2019-11-18 DIAGNOSIS — R972 Elevated prostate specific antigen [PSA]: Secondary | ICD-10-CM | POA: Diagnosis not present

## 2019-11-18 DIAGNOSIS — N400 Enlarged prostate without lower urinary tract symptoms: Secondary | ICD-10-CM | POA: Diagnosis not present

## 2020-01-03 ENCOUNTER — Other Ambulatory Visit: Payer: Self-pay | Admitting: Cardiovascular Disease

## 2020-01-03 DIAGNOSIS — E119 Type 2 diabetes mellitus without complications: Secondary | ICD-10-CM | POA: Diagnosis not present

## 2020-01-03 DIAGNOSIS — Z961 Presence of intraocular lens: Secondary | ICD-10-CM | POA: Diagnosis not present

## 2020-01-03 DIAGNOSIS — H35371 Puckering of macula, right eye: Secondary | ICD-10-CM | POA: Diagnosis not present

## 2020-01-06 NOTE — Progress Notes (Addendum)
Cardiology Office Note  Date: 01/07/2020   ID: [name], [name] 03-11-1946, MRN [id number]  PCP:  Leighton Ruff, MD  Cardiologist:  Sherren Mocha, MD Electrophysiologist:  None   Chief Complaint: F/U CAD, HTN, HLD, PAD, Carotid artery disease  History of Present Illness: [name] [name] is a 74 y.o. male with a history of CAD, AS, HTN, HLD, PAD, DM type II, carotid artery disease.  Last seen OV by [name] [name] on 07/09/2019 for follow-up CAD.  S/P CABG x 4  2009; s/p NSTEMI after CABG surgery 2009, PCI to distal SVG, CATH 2012 patent bypass graft, USAP October 2017> PCI; DES to distal S-OM2; all other grafts patent. Catheterization 2/2 angina 3/ 2020: Patent grafts, ostial RPDA 95>> PCI: DES to RPDA   Patient states he has been doing well from a cardiac standpoint.  States over the weekend he has been emptying his work shed with a fair amount of significant exertional activity such as lifting plywood sheets and other exertional activity without any progressive anginal or exertional symptoms, orthostatic symptoms, CVA or TIA-like symptoms.  Denies any claudication-like symptoms, DVT or PE-like symptoms, or lower extremity edema.  States he had one episode about 3 months ago while walking of brief/transient chest pain that quickly resolved.  He had no associated radiation or symptoms such as nausea or diaphoresis, dizziness.  He has had no further episodes of this nature since that time.  He will walks a lot during his job without problems. He is a Corporate treasurer.   Past Medical History:  Diagnosis Date  . Arrhythmia    Premature beats  . Atherosclerosis of native arteries of the extremities with intermittent claudication   . Coronary atherosclerosis of artery bypass graft    CABG 2009, LHC 07/2016 restenosis of SVG to OM treated with DES  . Diabetes mellitus ([place])   . Essential hypertension, benign   . Pure hypercholesterolemia     Past Surgical History:  Procedure Laterality  Date  . ADENOIDECTOMY    . CARDIAC CATHETERIZATION N/A 07/18/2016   Procedure: Left Heart Cath and Cors/Grafts Angiography;  Surgeon: Burnell Blanks, MD;  Location: [facility];  Service: Cardiovascular;  Laterality: N/A;  . CARDIAC CATHETERIZATION N/A 07/18/2016   Procedure: Coronary Stent Intervention;  Surgeon: Burnell Blanks, MD;  Location: [facility];  Service: Cardiovascular;  Laterality: N/A;  . CHOLECYSTECTOMY    . CORONARY ANGIOPLASTY WITH STENT PLACEMENT    . CORONARY ARTERY BYPASS GRAFT    . CORONARY STENT INTERVENTION N/A 01/11/2019   Procedure: CORONARY STENT INTERVENTION;  Surgeon: Sherren Mocha, MD;  Location: [place] [facility];  Service: Cardiovascular;  Laterality: N/A;  . INGUINAL HERNIA REPAIR Right   . LEFT HEART CATH AND CORS/GRAFTS ANGIOGRAPHY N/A 01/11/2019   Procedure: LEFT HEART CATH AND CORS/GRAFTS ANGIOGRAPHY;  Surgeon: Sherren Mocha, MD;  Location: [facility];  Service: Cardiovascular;  Laterality: N/A;  . TONSILLECTOMY      Current Outpatient Medications  Medication Sig Dispense Refill  . amLODipine (NORVASC) 10 MG tablet TAKE 1 TABLET BY MOUTH EVERY DAY 90 tablet 2  . aspirin EC 81 MG tablet Take 1 tablet (81 mg total) by mouth daily. 150 tablet 2  . benazepril (LOTENSIN) 40 MG tablet Take 40 mg by mouth daily.     . clopidogrel (PLAVIX) 75 MG tablet Take 1 tablet (75 mg total) by mouth daily. 94 tablet 3  . hydrochlorothiazide (HYDRODIURIL) 25 MG tablet  TAKE 1 TABLET BY MOUTH EVERY DAY 90 tablet 2  . isosorbide mononitrate (IMDUR) 60 MG 24 hr tablet TAKE 1 TABLET BY MOUTH EVERY DAY 90 tablet 1  . metFORMIN (GLUCOPHAGE) 1000 MG tablet Take 1,000 mg by mouth 2 (two) times daily with a meal.    . metoprolol tartrate (LOPRESSOR) 50 MG tablet TAKE 1.5 TABLETS (75 MG TOTAL) BY MOUTH 2 (TWO) TIMES DAILY. 270 tablet 1  . Multiple Vitamin (MULTIVITAMIN WITH MINERALS) TABS tablet Take 1 tablet by mouth daily.    . nitroGLYCERIN  (NITROSTAT) 0.4 MG SL tablet Place 1 tablet (0.4 mg total) under the tongue every 5 (five) minutes as needed for chest pain. 25 tablet 3  . rosuvastatin (CRESTOR) 20 MG tablet TAKE 1 TABLET BY MOUTH EVERY DAY 90 tablet 2   No current facility-administered medications for this visit.   Allergies:  Patient has no known allergies.   Social History: The patient  reports that he has never smoked. He has never used smokeless tobacco. He reports that he does not drink alcohol or use drugs.   Family History: The patient's family history includes Heart disease in his mother.   ROS:  Please see the history of present illness. Otherwise, complete review of systems is positive for none.  All other systems are reviewed and negative.   Physical Exam: VS:  BP 138/62   Pulse 63   Ht 5' 9"  (1.753 m)   Wt 206 lb (93.4 kg)   SpO2 97%   BMI 30.42 kg/m , BMI Body mass index is 30.42 kg/m.  Wt Readings from Last 3 Encounters:  01/07/20 206 lb (93.4 kg)  07/09/19 197 lb (89.4 kg)  04/23/19 193 lb 6.4 oz (87.7 kg)    General: Patient appears comfortable at rest. Neck: Supple, no elevated JVP bilateral carotid bruit, no thyromegaly. Lungs: Clear to auscultation, nonlabored breathing at rest. Cardiac: Regular rate and rhythm, no S3 has a 3/6 systolic ejection murmur best heard at right upper sternal border but heard throughout all auscultation points., no pericardial rub. Extremities: No pitting edema, distal pulses 2+. Skin: Warm and dry. Musculoskeletal: No kyphosis. Neuropsychiatric: Alert and oriented x3, affect grossly appropriate.  ECG:  An ECG dated 01/06/2020 was personally reviewed today and demonstrated:  Sinus rhythm rate of 63.  No acute ST or T wave abnormalities, normal axis.  No voltage criteria for LVH  Recent Labwork: 01/11/2019: BUN 15; Creatinine, Ser 1.02; Hemoglobin 13.5; Platelets 297; Potassium 4.0; Sodium 137     Component Value Date/Time   CHOL 109 07/18/2016 0037   TRIG 220  (H) 07/18/2016 0037   HDL 25 (L) 07/18/2016 0037   CHOLHDL 4.4 07/18/2016 0037   VLDL 44 (H) 07/18/2016 0037   LDLCALC 40 07/18/2016 0037    Other Studies Reviewed Today:  Carotid US 04/02/2019 Summary: Right Carotid: Velocities in the right ICA are consistent with a 1-39% stenosis.  Left Carotid: Velocities in the left ICA are consistent with a 1-39% stenosis.   Cardiac catheterization 01/11/2019 LAD ost 100 (CTO), dist 70 LCx ost 99 RCA prox 100, dist 40; RPDA ost 95 S-RCA patent S-OM2 stent patent S-D1 patent L-LAD patent PCI:  2.25 x 16 mm Synergy DES to ost RPDA      Carotid US 06/08/18 IMPRESSION: Extensive atherosclerotic disease in the bilateral carotid arteries, particularly at the right carotid bulb and proximal right internal carotid artery.  Estimated degree of stenosis in the proximal right internal carotid artery is greater than  70%. There is also stenosis at the right carotid bulb.  Estimated degree of stenosis in left internal carotid artery is less than 50%.  An incidental finding of potential clinical significance has been found. Hypoechoic nodule lateral to the right carotid vessels measures up to 2.6 cm and appears to represent an enlarged lymph node. Findings are nonspecific but consider further characterization with contrast enhanced CT of the neck (Patient states he had a biopsy of this area and it was benign)   LHC 07/18/16 LAD ostial 100, distal 70 LCx ostial 99 RCA proximal 30, mid 60, distal 40 SVG-RCA normal SVG-OM2 distal 99 ISR SVG-D1 normal LIMA-LAD normal PCI: 2.5 x 12 mm Promus Premier DES to distal SVG-OM2 1. 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  2. The LAD is occluded proximally. The LIMA graft is patent to the mid LAD and fills the mid and distal vessel. The patent vein graft fills the diagonal branch.  3. The Circumflex is occluded proximally. The OM branch fills from a patent vein  graft. The vein graft has severe restenosis in the stented segment at the anastomosis of the vein graft to the OM.  4. The RCA has moderate disease throughout. There is competitive flow distally from the patent vein graft to the distal RCA.  5. Successful PTCA/DES x 1 distal body of SVG to OM.  Echo 05/30/16 - Left ventricle: The cavity size was normal. Wall thickness was increased in a pattern of mild LVH. Systolic function was normal. The estimated ejection fraction was in the range of 55% to 60%. Wall motion was normal; there were no regional wall motion abnormalities. Doppler parameters are consistent with abnormal left ventricular relaxation (grade 1 diastolic dysfunction). - Aortic valve: There was mild stenosis.Mean 11 mmHg, peak 21 mmHg  Myoview 10/13 There is no sign of scar or ischemia. LV Ejection Fraction: 66%. LV Wall Motion: Normal.   Assessment and Plan:  1. Coronary artery disease of bypass graft of native heart with stable angina pectoris ([place])   2. Nonrheumatic aortic valve stenosis   3. Essential hypertension   4. Hyperlipidemia, unspecified hyperlipidemia type   5. Type 2 diabetes mellitus without complication, without long-term current use of insulin ([place])   6. Bilateral carotid artery stenosis    1. Coronary artery disease of bypass graft of native heart with stable angina pectoris [facility]) Patient states he has been working over the weekend NP in his shed at home lifting plywood with a fair amount of significant exertional activity without any progressive anginal or exertional symptoms.  He states about 3 months ago he had one episode of chest tightness while walking which was brief/transient without radiation or associated symptoms.  He had no other episodes after that.  Continue aspirin 81 mg, Imdur 60 mg, metoprolol 75 mg p.o. twice daily.  Nitroglycerin sublingual 0.4 mg as needed .  2. Nonrheumatic aortic valve stenosis There was an  echocardiogram ordered on 07/09/2019.  Patient states he was never notified that he needed an echocardiogram.  Please schedule echocardiogram today to assess aortic stenosis.  He does have a 3/6 systolic ejection murmur heard best at right upper sternal border but audible throughout all auscultation points.  He denies any anginal symptoms, syncopal episode or dyspnea.  3. Essential hypertension Blood pressure today is 138/62.  Patient states he periodically checks his blood pressure at home and usually this is the range his blood pressure stays when he checks it at home.  Continue HCTZ 25 mg daily.  4. Hyperlipidemia, unspecified hyperlipidemia type Recheck fasting lipid profile and LFTs when patient comes for echocardiogram.  Continue Crestor 20 mg p.o. daily.  5. Type 2 diabetes mellitus without complication, without long-term current use of insulin ([place]) Patient states his morning blood sugar today was 199.  His last hemoglobin A1c was 6.9%.  Advised him to follow-up with PCP who manages his diabetes.  6. Bilateral carotid artery stenosis Mild bilateral ICA stenosis 1-39% 03/2019. Is followed by vascular.  Advised patient vascular provider will likely order another carotid study at his next follow-up.  Medication Adjustments/Labs and Tests Ordered: Current medicines are reviewed at length with the patient today.  Concerns regarding medicines are outlined above.   Disposition: Follow-up with Dr. [name] or APP 6 months.  Signed, Levell July, NP 01/07/2020 11:59 AM    [place]

## 2020-01-07 ENCOUNTER — Other Ambulatory Visit: Payer: Self-pay | Admitting: Cardiology

## 2020-01-07 ENCOUNTER — Encounter: Payer: Self-pay | Admitting: Physician Assistant

## 2020-01-07 ENCOUNTER — Ambulatory Visit (INDEPENDENT_AMBULATORY_CARE_PROVIDER_SITE_OTHER): Payer: Medicare Other | Admitting: Family Medicine

## 2020-01-07 ENCOUNTER — Other Ambulatory Visit: Payer: Self-pay

## 2020-01-07 VITALS — BP 138/62 | HR 63 | Ht 69.0 in | Wt 206.0 lb

## 2020-01-07 DIAGNOSIS — E785 Hyperlipidemia, unspecified: Secondary | ICD-10-CM | POA: Diagnosis not present

## 2020-01-07 DIAGNOSIS — E119 Type 2 diabetes mellitus without complications: Secondary | ICD-10-CM | POA: Diagnosis not present

## 2020-01-07 DIAGNOSIS — I10 Essential (primary) hypertension: Secondary | ICD-10-CM

## 2020-01-07 DIAGNOSIS — I350 Nonrheumatic aortic (valve) stenosis: Secondary | ICD-10-CM

## 2020-01-07 DIAGNOSIS — I6523 Occlusion and stenosis of bilateral carotid arteries: Secondary | ICD-10-CM

## 2020-01-07 DIAGNOSIS — I25708 Atherosclerosis of coronary artery bypass graft(s), unspecified, with other forms of angina pectoris: Secondary | ICD-10-CM | POA: Diagnosis not present

## 2020-01-07 NOTE — Patient Instructions (Addendum)
Medication Instructions:   Your physician recommends that you continue on your current medications as directed. Please refer to the Current Medication list given to you today.  *If you need a refill on your cardiac medications before your next appointment, please call your pharmacy*  Lab Work:  Your physician recommends that you return for lab work on the same day as your echocardiogram for fasting lipid panel.  If you have labs (blood work) drawn today and your tests are completely normal, you will receive your results only by: Marland Kitchen MyChart Message (if you have MyChart) OR . A paper copy in the mail If you have any lab test that is abnormal or we need to change your treatment, we will call you to review the results.  Testing/Procedures:  Your physician has requested that you have an echocardiogram. Echocardiography is a painless test that uses sound waves to create images of your heart. It provides your doctor with information about the size and shape of your heart and how well your heart's chambers and valves are working. This procedure takes approximately one hour. There are no restrictions for this procedure.  Follow-Up: At [facility], you and your health needs are our priority.  As part of our continuing mission to provide you with exceptional heart care, we have created designated Provider Care Teams.  These Care Teams include your primary Cardiologist (physician) and Advanced Practice Providers (APPs -  Physician Assistants and Nurse Practitioners) who all work together to provide you with the care you need, when you need it.  We recommend signing up for the patient portal called "MyChart".  Sign up information is provided on this After Visit Summary.  MyChart is used to connect with patients for Virtual Visits (Telemedicine).  Patients are able to view lab/test results, encounter notes, upcoming appointments, etc.  Non-urgent messages can be sent to your provider as well.   To learn  more about what you can do with MyChart, go to [email or web address].    Your next appointment:   6 month(s)  The format for your next appointment:   In Person  Provider:   Sherren Mocha, MD

## 2020-01-23 ENCOUNTER — Other Ambulatory Visit: Payer: Medicare Other | Admitting: *Deleted

## 2020-01-23 ENCOUNTER — Other Ambulatory Visit: Payer: Self-pay

## 2020-01-23 ENCOUNTER — Ambulatory Visit (HOSPITAL_COMMUNITY): Payer: Medicare Other | Attending: Cardiology

## 2020-01-23 DIAGNOSIS — I6523 Occlusion and stenosis of bilateral carotid arteries: Secondary | ICD-10-CM | POA: Diagnosis not present

## 2020-01-23 DIAGNOSIS — E785 Hyperlipidemia, unspecified: Secondary | ICD-10-CM

## 2020-01-23 DIAGNOSIS — I350 Nonrheumatic aortic (valve) stenosis: Secondary | ICD-10-CM | POA: Diagnosis not present

## 2020-01-23 DIAGNOSIS — I25708 Atherosclerosis of coronary artery bypass graft(s), unspecified, with other forms of angina pectoris: Secondary | ICD-10-CM | POA: Diagnosis not present

## 2020-01-23 LAB — HEPATIC FUNCTION PANEL
ALT: 21 IU/L (ref 0–44)
AST: 24 IU/L (ref 0–40)
Albumin: 4.6 g/dL (ref 3.7–4.7)
Alkaline Phosphatase: 74 IU/L (ref 39–117)
Bilirubin Total: 0.4 mg/dL (ref 0.0–1.2)
Bilirubin, Direct: 0.12 mg/dL (ref 0.00–0.40)
Total Protein: 7.3 g/dL (ref 6.0–8.5)

## 2020-01-23 LAB — LIPID PANEL
Chol/HDL Ratio: 4.2 ratio (ref 0.0–5.0)
Cholesterol, Total: 174 mg/dL (ref 100–199)
HDL: 41 mg/dL (ref >39)
LDL Chol Calc (NIH): 96 mg/dL (ref 0–99)
Triglycerides: 214 mg/dL — ABNORMAL HIGH (ref 0–149)
VLDL Cholesterol Cal: 37 mg/dL (ref 5–40)

## 2020-01-24 ENCOUNTER — Telehealth: Payer: Self-pay

## 2020-01-24 DIAGNOSIS — E785 Hyperlipidemia, unspecified: Secondary | ICD-10-CM

## 2020-01-24 MED ORDER — EZETIMIBE 10 MG PO TABS: 10.0000 mg | ORAL_TABLET | Freq: Every day | ORAL | 3 refills | Status: DC

## 2020-01-24 NOTE — Telephone Encounter (Signed)
-----   Message from Verta Ellen., NP sent at 01/23/2020  9:06 PM EDT ----- Please call the patient and tell him his liver function test looked good. His LDL is still not at goal and his triglycerides are elevated. Ask him if he would be willing to try Zetia. If he agrees please start him on Zetia 10 mg po daily. Tell him to watch the saturated fats in his diet and decrease the amount of intake. This may help the Triglyceride number decrease. Thank You

## 2020-01-24 NOTE — Telephone Encounter (Signed)
Reviewed results with patient who verbalized understanding.   Instructed patient to START ZETIA 10 mg daily. Instructed him to decrease fats and carbohydrates. FLP, LFTs scheduled in July. He was grateful for call and agrees with treatment plan.

## 2020-01-25 ENCOUNTER — Ambulatory Visit: Payer: Medicare Other | Attending: Internal Medicine

## 2020-01-25 ENCOUNTER — Other Ambulatory Visit: Payer: Self-pay

## 2020-01-25 DIAGNOSIS — Z23 Encounter for immunization: Secondary | ICD-10-CM

## 2020-01-25 NOTE — Progress Notes (Signed)
   Covid-19 Vaccination Clinic  Name:  ABIEL ANTRIM    MRN: [id number] DOB: 20-Nov-1945  01/25/2020  Mr. Lama was observed post Covid-19 immunization for 15 minutes without incident. He was provided with Vaccine Information Sheet and instruction to access the V-Safe system.   Mr. Franks was instructed to call 911 with any severe reactions post vaccine: Marland Kitchen Difficulty breathing  . Swelling of face and throat  . A fast heartbeat  . A bad rash all over body  . Dizziness and weakness   Immunizations Administered    Name Date Dose VIS Date Route   Pfizer COVID-19 Vaccine 01/25/2020 12:39 PM 0.3 mL 09/27/2019 Intramuscular   Manufacturer: [place]   Lot: [id number]   [place]: [id number]

## 2020-01-26 ENCOUNTER — Other Ambulatory Visit: Payer: Self-pay | Admitting: Cardiovascular Disease

## 2020-02-14 DIAGNOSIS — Z7984 Long term (current) use of oral hypoglycemic drugs: Secondary | ICD-10-CM | POA: Diagnosis not present

## 2020-02-14 DIAGNOSIS — E119 Type 2 diabetes mellitus without complications: Secondary | ICD-10-CM | POA: Diagnosis not present

## 2020-02-14 DIAGNOSIS — E663 Overweight: Secondary | ICD-10-CM | POA: Diagnosis not present

## 2020-02-14 DIAGNOSIS — Z6829 Body mass index (BMI) 29.0-29.9, adult: Secondary | ICD-10-CM | POA: Diagnosis not present

## 2020-02-19 ENCOUNTER — Ambulatory Visit: Payer: Medicare Other | Attending: Internal Medicine

## 2020-02-19 DIAGNOSIS — Z23 Encounter for immunization: Secondary | ICD-10-CM

## 2020-02-19 NOTE — Progress Notes (Signed)
   Covid-19 Vaccination Clinic  Name:  [name] [name]    MRN: [id number] DOB: 1946-06-05  02/19/2020  [name] was observed post Covid-19 immunization for 15 minutes without incident. He was provided with Vaccine Information Sheet and instruction to access the V-Safe system.   [name] was instructed to call 911 with any severe reactions post vaccine: Marland Kitchen Difficulty breathing  . Swelling of face and throat  . A fast heartbeat  . A bad rash all over body  . Dizziness and weakness   Immunizations Administered    Name Date Dose VIS Date [place] COVID-19 Vaccine 02/19/2020 11:54 AM 0.3 mL 12/11/2018 Intramuscular   Manufacturer: [place]   Lot: [id number]   [place]: [id number]

## 2020-03-13 DIAGNOSIS — E1165 Type 2 diabetes mellitus with hyperglycemia: Secondary | ICD-10-CM | POA: Diagnosis not present

## 2020-03-13 DIAGNOSIS — I10 Essential (primary) hypertension: Secondary | ICD-10-CM | POA: Diagnosis not present

## 2020-03-13 DIAGNOSIS — E7800 Pure hypercholesterolemia, unspecified: Secondary | ICD-10-CM | POA: Diagnosis not present

## 2020-03-13 DIAGNOSIS — N400 Enlarged prostate without lower urinary tract symptoms: Secondary | ICD-10-CM | POA: Diagnosis not present

## 2020-03-13 DIAGNOSIS — I2510 Atherosclerotic heart disease of native coronary artery without angina pectoris: Secondary | ICD-10-CM | POA: Diagnosis not present

## 2020-03-15 ENCOUNTER — Other Ambulatory Visit: Payer: Self-pay | Admitting: Cardiovascular Disease

## 2020-03-30 ENCOUNTER — Other Ambulatory Visit: Payer: Self-pay | Admitting: Cardiovascular Disease

## 2020-04-03 ENCOUNTER — Other Ambulatory Visit: Payer: Self-pay | Admitting: *Deleted

## 2020-04-03 DIAGNOSIS — I6523 Occlusion and stenosis of bilateral carotid arteries: Secondary | ICD-10-CM

## 2020-04-14 ENCOUNTER — Encounter (HOSPITAL_COMMUNITY): Payer: Medicare Other

## 2020-04-14 ENCOUNTER — Ambulatory Visit: Payer: Medicare Other | Admitting: Vascular Surgery

## 2020-04-22 ENCOUNTER — Other Ambulatory Visit: Payer: Self-pay | Admitting: Cardiovascular Disease

## 2020-04-27 ENCOUNTER — Other Ambulatory Visit: Payer: Medicare Other | Admitting: *Deleted

## 2020-04-27 ENCOUNTER — Other Ambulatory Visit: Payer: Self-pay

## 2020-04-27 DIAGNOSIS — E785 Hyperlipidemia, unspecified: Secondary | ICD-10-CM

## 2020-04-27 LAB — LIPID PANEL
Chol/HDL Ratio: 3.3 ratio (ref 0.0–5.0)
Cholesterol, Total: 129 mg/dL (ref 100–199)
HDL: 39 mg/dL — ABNORMAL LOW (ref >39)
LDL Chol Calc (NIH): 63 mg/dL (ref 0–99)
Triglycerides: 156 mg/dL — ABNORMAL HIGH (ref 0–149)
VLDL Cholesterol Cal: 27 mg/dL (ref 5–40)

## 2020-04-27 LAB — HEPATIC FUNCTION PANEL
ALT: 21 IU/L (ref 0–44)
AST: 25 IU/L (ref 0–40)
Albumin: 4.3 g/dL (ref 3.7–4.7)
Alkaline Phosphatase: 63 IU/L (ref 48–121)
Bilirubin Total: 0.3 mg/dL (ref 0.0–1.2)
Bilirubin, Direct: 0.12 mg/dL (ref 0.00–0.40)
Total Protein: 6.9 g/dL (ref 6.0–8.5)

## 2020-04-28 DIAGNOSIS — M7022 Olecranon bursitis, left elbow: Secondary | ICD-10-CM | POA: Diagnosis not present

## 2020-05-20 DIAGNOSIS — M25522 Pain in left elbow: Secondary | ICD-10-CM | POA: Diagnosis not present

## 2020-05-26 ENCOUNTER — Ambulatory Visit (HOSPITAL_COMMUNITY)
Admission: RE | Admit: 2020-05-26 | Discharge: 2020-05-26 | Disposition: A | Discharge status: Home / Self Care | Payer: Medicare Other | Source: Ambulatory Visit | Attending: Vascular Surgery | Admitting: Vascular Surgery

## 2020-05-26 ENCOUNTER — Other Ambulatory Visit: Payer: Self-pay

## 2020-05-26 ENCOUNTER — Encounter: Payer: Self-pay | Admitting: Vascular Surgery

## 2020-05-26 ENCOUNTER — Ambulatory Visit (INDEPENDENT_AMBULATORY_CARE_PROVIDER_SITE_OTHER): Payer: Medicare Other | Admitting: Vascular Surgery

## 2020-05-26 VITALS — BP 103/69 | HR 57 | Temp 97.9°F | Resp 20 | Ht 69.0 in | Wt 200.0 lb

## 2020-05-26 DIAGNOSIS — I6523 Occlusion and stenosis of bilateral carotid arteries: Secondary | ICD-10-CM | POA: Diagnosis not present

## 2020-05-26 NOTE — Progress Notes (Signed)
Patient name: [name] [name] MRN: [id number] DOB: 07-02-1946 Sex: male  REASON FOR CONSULT: 1 year follow-up for surveillance of carotid artery disease  HPI: ANASTASIOS [name] is a 74 y.o. male with history of coronary artery disease status post CABG in 2009, diabetes, hypertension, history of NSTEMI most recent echo showed EF of 55 to 60% that was initially referred for right carotid stenosis.   Patient states he was being seen by his PCP and noted a bruit on the right side of his neck.  He ultimately had an ultrasound at [facility] radiology that was concerning for 70% right ICA stenosis.  A repeat ultrasound here suggested stenosis less than 50% and we have been following his carotid disease with surveillance ultrasound. He presents today for 1 year follow-up.  He reports no new neurologic events including weakness numbness tingling vision loss etc. over the past year.  Otherwise stayed healthy.  Got his Covid vaccine.  No new concerns today.  Past Medical History:  Diagnosis Date  . Arrhythmia    Premature beats  . Atherosclerosis of native arteries of the extremities with intermittent claudication   . Coronary atherosclerosis of artery bypass graft    CABG 2009, LHC 07/2016 restenosis of SVG to OM treated with DES  . Diabetes mellitus ([place])   . Essential hypertension, benign   . Pure hypercholesterolemia     Past Surgical History:  Procedure Laterality Date  . ADENOIDECTOMY    . CARDIAC CATHETERIZATION N/A 07/18/2016   Procedure: Left Heart Cath and Cors/Grafts Angiography;  Surgeon: Burnell Blanks, MD;  Location: [place] [facility];  Service: Cardiovascular;  Laterality: N/A;  . CARDIAC CATHETERIZATION N/A 07/18/2016   Procedure: Coronary Stent Intervention;  Surgeon: Burnell Blanks, MD;  Location: [place] [facility];  Service: Cardiovascular;  Laterality: N/A;  . CHOLECYSTECTOMY    . CORONARY ANGIOPLASTY WITH STENT PLACEMENT    . CORONARY ARTERY BYPASS GRAFT    .  CORONARY STENT INTERVENTION N/A 01/11/2019   Procedure: CORONARY STENT INTERVENTION;  Surgeon: Sherren Mocha, MD;  Location: [place] [facility];  Service: Cardiovascular;  Laterality: N/A;  . INGUINAL HERNIA REPAIR Right   . LEFT HEART CATH AND CORS/GRAFTS ANGIOGRAPHY N/A 01/11/2019   Procedure: LEFT HEART CATH AND CORS/GRAFTS ANGIOGRAPHY;  Surgeon: Sherren Mocha, MD;  Location: [place] [facility];  Service: Cardiovascular;  Laterality: N/A;  . TONSILLECTOMY      Family History  Problem Relation Age of Onset  . Heart disease Mother        No details  . Diabetes Neg Hx     SOCIAL HISTORY: Social History   Socioeconomic History  . Marital status: Married    Spouse name: Not on file  . Number of children: Not on file  . Years of education: Not on file  . Highest education level: Not on file  Occupational History  . Not on file  Tobacco Use  . Smoking status: Never Smoker  . Smokeless tobacco: Never Used  Vaping Use  . Vaping Use: Never used  Substance and Sexual Activity  . Alcohol use: No  . Drug use: No  . Sexual activity: Not on file  Other Topics Concern  . Not on file  Social History Narrative  . Not on file   Social Determinants of Health   Financial Resource Strain:   . Difficulty of Paying Living Expenses:   Food Insecurity:   . Worried About Charity fundraiser in the  Last Year:   . [place] in the Last Year:   Transportation Needs:   . Film/video editor (Medical):   Marland Kitchen Lack of Transportation (Non-Medical):   Physical Activity:   . Days of Exercise per Week:   . Minutes of Exercise per Session:   Stress:   . Feeling of Stress :   Social Connections:   . Frequency of Communication with Friends and Family:   . Frequency of Social Gatherings with Friends and Family:   . Attends Religious Services:   . Active Member of Clubs or Organizations:   . Attends Archivist Meetings:   Marland Kitchen Marital Status:   Intimate Partner Violence:   .  Fear of Current or Ex-Partner:   . Emotionally Abused:   Marland Kitchen Physically Abused:   . Sexually Abused:     No Known Allergies  Current Outpatient Medications  Medication Sig Dispense Refill  . amLODipine (NORVASC) 10 MG tablet TAKE 1 TABLET BY MOUTH EVERY DAY 90 tablet 2  . aspirin EC 81 MG tablet SMARTSIG:1 Tablet(s) By Mouth Daily    . benazepril (LOTENSIN) 40 MG tablet Take 40 mg by mouth daily.     . clopidogrel (PLAVIX) 75 MG tablet Take 1 tablet (75 mg total) by mouth daily. 90 tablet 3  . ezetimibe (ZETIA) 10 MG tablet Take 1 tablet (10 mg total) by mouth daily. 90 tablet 3  . hydrochlorothiazide (HYDRODIURIL) 25 MG tablet TAKE 1 TABLET BY MOUTH EVERY DAY 90 tablet 2  . isosorbide mononitrate (IMDUR) 60 MG 24 hr tablet TAKE 1 TABLET BY MOUTH EVERY DAY 90 tablet 1  . metFORMIN (GLUCOPHAGE) 1000 MG tablet Take 1,000 mg by mouth 2 (two) times daily with a meal.    . metoprolol tartrate (LOPRESSOR) 50 MG tablet TAKE 1.5 TABLETS (75 MG TOTAL) BY MOUTH 2 (TWO) TIMES DAILY. 270 tablet 3  . Multiple Vitamin (MULTIVITAMIN WITH MINERALS) TABS tablet Take 1 tablet by mouth daily.    . nitroGLYCERIN (NITROSTAT) 0.4 MG SL tablet PLACE 1 TABLET (0.4 MG TOTAL) UNDER THE TONGUE EVERY 5 (FIVE) MINUTES AS NEEDED FOR CHEST PAIN. 25 tablet 3  . rosuvastatin (CRESTOR) 20 MG tablet TAKE 1 TABLET BY MOUTH EVERY DAY 90 tablet 2   No current facility-administered medications for this visit.    REVIEW OF SYSTEMS:  [X]  denotes positive finding, [ ]  denotes negative finding Cardiac  Comments:  Chest pain or chest pressure:    Shortness of breath upon exertion:    Short of breath when lying flat:    Irregular heart rhythm:        Vascular    Pain in calf, thigh, or hip brought on by ambulation:    Pain in feet at night that wakes you up from your sleep:     Blood clot in your veins:    Leg swelling:         Pulmonary    Oxygen at home:    Productive cough:     Wheezing:         Neurologic      Sudden weakness in arms or legs:     Sudden numbness in arms or legs:     Sudden onset of difficulty speaking or slurred speech:    Temporary loss of vision in one eye:     Problems with dizziness:         Gastrointestinal    Blood in stool:     Vomited blood:  Genitourinary    Burning when urinating:     Blood in urine:        Psychiatric    Major depression:         Hematologic    Bleeding problems:    Problems with blood clotting too easily:        Skin    Rashes or ulcers:        Constitutional    Fever or chills:      PHYSICAL EXAM: Vitals:   05/26/20 1128 05/26/20 1131  BP: 113/69 103/69  Pulse: (!) 57   Resp: 20   Temp: 97.9 F (36.6 C)   SpO2: 97%   Weight: 200 lb (90.7 kg)   Height: 5' 9"  (1.753 m)     GENERAL: The patient is a well-nourished male, in no acute distress. The vital signs are documented above. CARDIAC: There is a regular rate and rhythm.  VASCULAR:  2+ bilateral femoral pulse palpable groins 2+ palpable DP/PT BLE PULMONARY: There is good air exchange bilaterally without wheezing or rales. ABDOMEN: Soft and non-tender with normal pitched bowel sounds.  MUSCULOSKELETAL: There are no major deformities or cyanosis. NEUROLOGIC: No focal weakness or paresthesias are detected.  CN II-XII grossly intact.  DATA:   Carotid duplex: Right ICA velocity essentially the same 202/34 increased from 197/23 6 months ago consistent with a borderline 40 to 59% stenosis  Assessment/Plan:  74 year old male that presents with asymptomatic right ICA stenosis likely on the low end of 40 to 59% range.  Duplex today suggests 1 to 39% stenosis bilaterally but likely on the low end of the 40 to 59% range given previous duplex ultrasound studies.  Either way he remains asymptomatic from a carotid artery artery disease standpoint.  Discussed current guidelines would be surgery for greater than 80% stenosis.  We will continue medical management for now with  ongoing surveillance and will repeat a duplex in 1 year in our office.  He knows to call with questions or concerns.    Marty Heck, MD [facility] Office: [place]

## 2020-06-03 DIAGNOSIS — M25522 Pain in left elbow: Secondary | ICD-10-CM | POA: Diagnosis not present

## 2020-06-04 ENCOUNTER — Other Ambulatory Visit: Payer: Self-pay | Admitting: Cardiovascular Disease

## 2020-06-06 ENCOUNTER — Other Ambulatory Visit: Payer: Self-pay | Admitting: Cardiovascular Disease

## 2020-06-19 DIAGNOSIS — Z6829 Body mass index (BMI) 29.0-29.9, adult: Secondary | ICD-10-CM | POA: Diagnosis not present

## 2020-06-19 DIAGNOSIS — E119 Type 2 diabetes mellitus without complications: Secondary | ICD-10-CM | POA: Diagnosis not present

## 2020-06-19 DIAGNOSIS — E669 Obesity, unspecified: Secondary | ICD-10-CM | POA: Diagnosis not present

## 2020-06-22 DIAGNOSIS — Z23 Encounter for immunization: Secondary | ICD-10-CM | POA: Diagnosis not present

## 2020-06-24 DIAGNOSIS — Z6831 Body mass index (BMI) 31.0-31.9, adult: Secondary | ICD-10-CM | POA: Diagnosis not present

## 2020-06-24 DIAGNOSIS — I10 Essential (primary) hypertension: Secondary | ICD-10-CM | POA: Diagnosis not present

## 2020-06-24 DIAGNOSIS — E119 Type 2 diabetes mellitus without complications: Secondary | ICD-10-CM | POA: Diagnosis not present

## 2020-07-20 DIAGNOSIS — Z7984 Long term (current) use of oral hypoglycemic drugs: Secondary | ICD-10-CM | POA: Diagnosis not present

## 2020-07-20 DIAGNOSIS — E1159 Type 2 diabetes mellitus with other circulatory complications: Secondary | ICD-10-CM | POA: Diagnosis not present

## 2020-07-20 DIAGNOSIS — I10 Essential (primary) hypertension: Secondary | ICD-10-CM | POA: Diagnosis not present

## 2020-07-20 DIAGNOSIS — E782 Mixed hyperlipidemia: Secondary | ICD-10-CM | POA: Diagnosis not present

## 2020-07-20 DIAGNOSIS — I2510 Atherosclerotic heart disease of native coronary artery without angina pectoris: Secondary | ICD-10-CM | POA: Diagnosis not present

## 2020-07-31 DIAGNOSIS — Z6828 Body mass index (BMI) 28.0-28.9, adult: Secondary | ICD-10-CM | POA: Diagnosis not present

## 2020-07-31 DIAGNOSIS — E119 Type 2 diabetes mellitus without complications: Secondary | ICD-10-CM | POA: Diagnosis not present

## 2020-07-31 DIAGNOSIS — R635 Abnormal weight gain: Secondary | ICD-10-CM | POA: Diagnosis not present

## 2020-08-03 DIAGNOSIS — E1159 Type 2 diabetes mellitus with other circulatory complications: Secondary | ICD-10-CM | POA: Diagnosis not present

## 2020-08-03 DIAGNOSIS — I10 Essential (primary) hypertension: Secondary | ICD-10-CM | POA: Diagnosis not present

## 2020-08-03 DIAGNOSIS — Z794 Long term (current) use of insulin: Secondary | ICD-10-CM | POA: Diagnosis not present

## 2020-08-03 DIAGNOSIS — Z951 Presence of aortocoronary bypass graft: Secondary | ICD-10-CM | POA: Diagnosis not present

## 2020-08-03 DIAGNOSIS — E785 Hyperlipidemia, unspecified: Secondary | ICD-10-CM | POA: Diagnosis not present

## 2020-08-31 ENCOUNTER — Ambulatory Visit (INDEPENDENT_AMBULATORY_CARE_PROVIDER_SITE_OTHER): Payer: Medicare Other | Admitting: Cardiovascular Disease

## 2020-08-31 ENCOUNTER — Encounter: Payer: Self-pay | Admitting: Cardiovascular Disease

## 2020-08-31 ENCOUNTER — Other Ambulatory Visit: Payer: Self-pay

## 2020-08-31 VITALS — BP 138/74 | HR 70 | Ht 69.0 in | Wt 198.0 lb

## 2020-08-31 DIAGNOSIS — I350 Nonrheumatic aortic (valve) stenosis: Secondary | ICD-10-CM

## 2020-08-31 DIAGNOSIS — I10 Essential (primary) hypertension: Secondary | ICD-10-CM

## 2020-08-31 DIAGNOSIS — E782 Mixed hyperlipidemia: Secondary | ICD-10-CM

## 2020-08-31 DIAGNOSIS — I739 Peripheral vascular disease, unspecified: Secondary | ICD-10-CM

## 2020-08-31 DIAGNOSIS — I6523 Occlusion and stenosis of bilateral carotid arteries: Secondary | ICD-10-CM | POA: Diagnosis not present

## 2020-08-31 DIAGNOSIS — I25119 Atherosclerotic heart disease of native coronary artery with unspecified angina pectoris: Secondary | ICD-10-CM | POA: Diagnosis not present

## 2020-08-31 LAB — BASIC METABOLIC PANEL
BUN/Creatinine Ratio: 14 (ref 10–24)
BUN: 14 mg/dL (ref 8–27)
CO2: 19 mmol/L — ABNORMAL LOW (ref 20–29)
Calcium: 10 mg/dL (ref 8.6–10.2)
Chloride: 99 mmol/L (ref 96–106)
Creatinine, Ser: 1.02 mg/dL (ref 0.76–1.27)
GFR calc Af Amer: 82 mL/min/{1.73_m2} (ref >59)
GFR calc non Af Amer: 71 mL/min/{1.73_m2} (ref >59)
Glucose: 203 mg/dL — ABNORMAL HIGH (ref 65–99)
Potassium: 4.1 mmol/L (ref 3.5–5.2)
Sodium: 137 mmol/L (ref 134–144)

## 2020-08-31 NOTE — Patient Instructions (Addendum)
Medication Instructions:  Your provider recommends that you continue on your current medications as directed. Please refer to the Current Medication list given to you today.   *If you need a refill on your cardiac medications before your next appointment, please call your pharmacy*  Labs: TODAY! BMET  Testing/Procedures: Your physician has requested that you have an ankle brachial index (ABI). During this test an ultrasound and blood pressure cuff are used to evaluate the arteries that supply the arms and legs with blood. Allow thirty minutes for this exam. There are no restrictions or special instructions.  Follow-Up: You will be called to arrange your 6 month echo and office visit with Dr. [name] when his schedule is available.

## 2020-08-31 NOTE — Progress Notes (Signed)
Cardiology Office Note:    Date:  08/31/2020   ID:  [name] [name], [name] [name] [birth year], MRN [id number]  PCP:  Leighton Ruff, MD  [facility] Cardiologist:  Sherren Mocha, MD  [place] Electrophysiologist:  None   Referring MD: Leighton Ruff, MD   Chief Complaint  Patient presents with  . Coronary Artery Disease    History of Present Illness:    [name] [name] is a 74 y.o. male with a hx of:  Coronary artery disease  ? S/p CABG in 2009 ? s/p NSTEMI soon after surgery in 2009: PCI to distal SVG ? Cath in 2012: patent bypass grafts ? Unstable angina 10/17 >> PCI: DES to Dist S-OM2; all other grafts patent ? Cath 12/2018: patent grafts; oRPDA 95 >> PCI: DES to RPDA   Aortic stenosis ? Echocardiogram 8/17: mean 11 mmHg  Hypertension   Hyperlipidemia   Diabetes mellitus   PAD  Carotid Artery Disease  The patient is here alone today.  He reports stable symptoms of exertional angina that occurs when he gets in a hurry.  He is otherwise able to do walking without any significant limitation.  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  He denies orthopnea, PND, or leg swelling.  He denies heart palpitations, lightheadedness, or syncope.  Other complaints include generalized fatigue.  He has had exertional tiredness/aching in the right calf for some time.  More recently he had some pain in the upper leg where he felt like he had cramping when he was walking.  He has not had any left leg symptoms.  Denies ulceration or rest pain.  He did have some worsening of his angina several months ago but this is resolved now and he is back to his previous stable symptoms.  He has had no changes in his medication since he was seen last.  Past Medical History:  Diagnosis Date  . Arrhythmia    Premature beats  . Atherosclerosis of native arteries of the extremities with intermittent claudication   . Coronary atherosclerosis of artery bypass graft    CABG 2009, LHC 07/2016  restenosis of SVG to OM treated with DES  . Diabetes mellitus ([place])   . Essential hypertension, benign   . Pure hypercholesterolemia     Past Surgical History:  Procedure Laterality Date  . ADENOIDECTOMY    . CARDIAC CATHETERIZATION N/A 07/18/2016   Procedure: Left Heart Cath and Cors/Grafts Angiography;  Surgeon: Burnell Blanks, MD;  Location: [place] [facility];  Service: Cardiovascular;  Laterality: N/A;  . CARDIAC CATHETERIZATION N/A 07/18/2016   Procedure: Coronary Stent Intervention;  Surgeon: Burnell Blanks, MD;  Location: [place] [facility];  Service: Cardiovascular;  Laterality: N/A;  . CHOLECYSTECTOMY    . CORONARY ANGIOPLASTY WITH STENT PLACEMENT    . CORONARY ARTERY BYPASS GRAFT    . CORONARY STENT INTERVENTION N/A 01/11/2019   Procedure: CORONARY STENT INTERVENTION;  Surgeon: Sherren Mocha, MD;  Location: [place] [facility];  Service: Cardiovascular;  Laterality: N/A;  . INGUINAL HERNIA REPAIR Right   . LEFT HEART CATH AND CORS/GRAFTS ANGIOGRAPHY N/A 01/11/2019   Procedure: LEFT HEART CATH AND CORS/GRAFTS ANGIOGRAPHY;  Surgeon: Sherren Mocha, MD;  Location: [place] [facility];  Service: Cardiovascular;  Laterality: N/A;  . TONSILLECTOMY      Current Medications: Current Meds  Medication Sig  . amLODipine (NORVASC) 10 MG tablet TAKE 1 TABLET BY MOUTH EVERY DAY  . aspirin 81 MG EC tablet TAKE 1 TABLET  BY MOUTH EVERY DAY  . benazepril (LOTENSIN) 40 MG tablet Take 40 mg by mouth daily.   . clopidogrel (PLAVIX) 75 MG tablet Take 1 tablet (75 mg total) by mouth daily.  Marland Kitchen ezetimibe (ZETIA) 10 MG tablet Take 1 tablet (10 mg total) by mouth daily.  Marland Kitchen glucose blood (PRECISION QID TEST) test strip 1 each by Other route as directed.  . hydrochlorothiazide (HYDRODIURIL) 25 MG tablet TAKE 1 TABLET BY MOUTH EVERY DAY  . Insulin NPH, Human,, Isophane, (HUMULIN N) 100 UNIT/ML Kiwkpen Inject 15 Units into the skin in the morning.  . Insulin Syringe-Needle U-100 (BD  INSULIN SYRINGE U/F) 31G X 5/16" 1 ML MISC 1 each by Other route as directed.  . isosorbide mononitrate (IMDUR) 60 MG 24 hr tablet TAKE 1 TABLET BY MOUTH EVERY DAY  . metFORMIN (GLUCOPHAGE) 1000 MG tablet Take 1,000 mg by mouth 2 (two) times daily with a meal.  . metoprolol tartrate (LOPRESSOR) 50 MG tablet TAKE 1.5 TABLETS (75 MG TOTAL) BY MOUTH 2 (TWO) TIMES DAILY.  . Multiple Vitamin (MULTIVITAMIN WITH MINERALS) TABS tablet Take 1 tablet by mouth daily.  . nitroGLYCERIN (NITROSTAT) 0.4 MG SL tablet PLACE 1 TABLET (0.4 MG TOTAL) UNDER THE TONGUE EVERY 5 (FIVE) MINUTES AS NEEDED FOR CHEST PAIN.  . rosuvastatin (CRESTOR) 20 MG tablet TAKE 1 TABLET BY MOUTH EVERY DAY     Allergies:   Patient has no known allergies.   Social History   Socioeconomic History  . Marital status: Married    Spouse name: Not on file  . Number of children: Not on file  . Years of education: Not on file  . Highest education level: Not on file  Occupational History  . Not on file  Tobacco Use  . Smoking status: Never Smoker  . Smokeless tobacco: Never Used  Vaping Use  . Vaping Use: Never used  Substance and Sexual Activity  . Alcohol use: No  . Drug use: No  . Sexual activity: Not on file  Other Topics Concern  . Not on file  Social History Narrative  . Not on file   Social Determinants of Health   Financial Resource Strain:   . Difficulty of Paying Living Expenses: Not on file  Food Insecurity:   . Worried About Charity fundraiser in the Last Year: Not on file  . Ran Out of Food in the Last Year: Not on file  Transportation Needs:   . Lack of Transportation (Medical): Not on file  . Lack of Transportation (Non-Medical): Not on file  Physical Activity:   . Days of Exercise per Week: Not on file  . Minutes of Exercise per Session: Not on file  Stress:   . Feeling of Stress : Not on file  Social Connections:   . Frequency of Communication with Friends and Family: Not on file  . Frequency of  Social Gatherings with Friends and Family: Not on file  . Attends Religious Services: Not on file  . Active Member of Clubs or Organizations: Not on file  . Attends Archivist Meetings: Not on file  . Marital Status: Not on file     Family History: The patient's family history includes Heart disease in his mother. There is no history of Diabetes.  ROS:   Please see the history of present illness.    All other systems reviewed and are negative.  EKGs/Labs/Other Studies Reviewed:    The following studies were reviewed today: Echo 01/23/20: IMPRESSIONS  1. Left ventricular ejection fraction, by estimation, is 55 to 60%. The  left ventricle has normal function. The left ventricle has no regional  wall motion abnormalities. Left ventricular diastolic parameters are  consistent with Grade I diastolic  dysfunction (impaired relaxation).  2. Right ventricular systolic function is normal. The right ventricular  size is normal. There is normal pulmonary artery systolic pressure. The  estimated right ventricular systolic pressure is 56.2 mmHg.  3. The mitral valve is normal in structure. No evidence of mitral valve  regurgitation. No evidence of mitral stenosis.  4. The aortic valve is tricuspid. Aortic valve regurgitation is not  visualized. Moderate aortic valve stenosis. Aortic valve area, by VTI  measures 1.19 cm. Aortic valve mean gradient measures 22.0 mmHg.  5. The inferior vena cava is normal in size with greater than 50%  respiratory variability, suggesting right atrial pressure of 3 mmHg.   Carotid US 05-26-20: Summary:  Right Carotid: Velocities in the right ICA are consistent with a 1-39%  stenosis.         Bifurcation velocity 202/34 cm/s.   Left Carotid: Velocities in the left ICA are consistent with a 1-39%  stenosis.   Vertebrals: Bilateral vertebral arteries demonstrate antegrade flow.  Subclavians: Normal flow hemodynamics were seen in  bilateral subclavian        arteries.   EKG:  EKG is not ordered today.    Recent Labs: 04/27/2020: ALT 21  Recent Lipid Panel    Component Value Date/Time   CHOL 129 04/27/2020 0824   TRIG 156 (H) 04/27/2020 0824   HDL 39 (L) 04/27/2020 0824   CHOLHDL 3.3 04/27/2020 0824   CHOLHDL 4.4 07/18/2016 0037   VLDL 44 (H) 07/18/2016 0037   LDLCALC 63 04/27/2020 0824     Risk Assessment/Calculations:       Physical Exam:    VS:  BP 138/74   Pulse 70   Ht 5' 9"  (1.753 m)   Wt 198 lb (89.8 kg)   SpO2 98%   BMI 29.24 kg/m     Wt Readings from Last 3 Encounters:  08/31/20 198 lb (89.8 kg)  05/26/20 200 lb (90.7 kg)  01/07/20 206 lb (93.4 kg)     GEN:  Well nourished, well developed in no acute distress HEENT: Normal NECK: No JVD; No carotid bruits LYMPHATICS: No lymphadenopathy CARDIAC: RRR, 3/6 harsh mid peaking crescendo decrescendo murmur at the right upper sternal border RESPIRATORY:  Clear to auscultation without rales, wheezing or rhonchi  ABDOMEN: Soft, non-tender, non-distended MUSCULOSKELETAL:  No edema; No deformity  SKIN: Warm and dry NEUROLOGIC:  Alert and oriented x 3 PSYCHIATRIC:  Normal affect   ASSESSMENT:    1. Coronary artery disease involving native coronary artery of native heart with angina pectoris ([place])   2. Essential hypertension   3. Mixed hyperlipidemia   4. Nonrheumatic aortic valve stenosis   5. Right leg claudication ([facility])    PLAN:    In order of problems listed above:  1. The patient appears clinically stable with CCS functional class II symptoms of chronic exertional angina.  He will remain on dual antiplatelet therapy with aspirin and clopidogrel.  He remains on a combination of amlodipine, isosorbide, and metoprolol for antianginal therapy.  He is treated with a high intensity statin drug.  We will continue current management.  I reviewed his last heart catheterization films during his visit today.  At the time of his last  cath he underwent PCI of the native  PDA through saphenous vein graft conduit.  He was treated with a 2.25 mm drug-eluting stent. 2. Blood pressure is well controlled on current medical therapy which includes an ACE inhibitor, beta-blocker, calcium channel blocker, and thiazide diuretic.  Most recent labs are reviewed with a creatinine of 1.02 in 2020.  He is due for an updated metabolic panel which will be arranged. 3. Lipids at goal with LDL cholesterol 63 mg/dL.  Continue current therapy. 4. Aortic stenosis is moderate by exam and echo.  He will be due for annual echo surveillance next year.  Stable functional class II symptoms of exertional dyspnea and angina noted. Typical symptoms of claudication.  Will arrange vascular ultrasound study of his right lower extremity to assess for arterial disease.  He continues on medical management as above.  Shared Decision Making/Informed Consent        Medication Adjustments/Labs and Tests Ordered: Current medicines are reviewed at length with the patient today.  Concerns regarding medicines are outlined above.  Orders Placed This Encounter  Procedures  . Basic metabolic panel  . ECHOCARDIOGRAM COMPLETE  . VAS [place] LOWER EXTREMITY ARTERIAL DUPLEX  . VAS [place] ABI WITH/WO TBI   No orders of the defined types were placed in this encounter.   Patient Instructions  Medication Instructions:  Your provider recommends that you continue on your current medications as directed. Please refer to the Current Medication list given to you today.   *If you need a refill on your cardiac medications before your next appointment, please call your pharmacy*  Labs: TODAY! BMET  Testing/Procedures: Your physician has requested that you have an ankle brachial index (ABI). During this test an ultrasound and blood pressure cuff are used to evaluate the arteries that supply the arms and legs with blood. Allow thirty minutes for this exam. There are no restrictions or  special instructions.  Follow-Up: You will be called to arrange your 6 month echo and office visit with Dr. [name] when his schedule is available.     Signed, Sherren Mocha, MD  08/31/2020 1:20 PM    [facility]

## 2020-09-04 DIAGNOSIS — Z23 Encounter for immunization: Secondary | ICD-10-CM | POA: Diagnosis not present

## 2020-09-14 ENCOUNTER — Ambulatory Visit (HOSPITAL_COMMUNITY)
Admission: RE | Admit: 2020-09-14 | Discharge: 2020-09-14 | Disposition: A | Discharge status: Home / Self Care | Payer: Medicare Other | Source: Ambulatory Visit | Attending: Cardiology | Admitting: Cardiology

## 2020-09-14 ENCOUNTER — Other Ambulatory Visit: Payer: Self-pay

## 2020-09-14 DIAGNOSIS — I739 Peripheral vascular disease, unspecified: Secondary | ICD-10-CM | POA: Insufficient documentation

## 2020-09-15 ENCOUNTER — Encounter: Payer: Self-pay | Admitting: Cardiovascular Disease

## 2020-09-15 ENCOUNTER — Ambulatory Visit (INDEPENDENT_AMBULATORY_CARE_PROVIDER_SITE_OTHER): Payer: Medicare Other | Admitting: Cardiovascular Disease

## 2020-09-15 VITALS — BP 138/72 | HR 56 | Ht 69.0 in | Wt 196.0 lb

## 2020-09-15 DIAGNOSIS — I2510 Atherosclerotic heart disease of native coronary artery without angina pectoris: Secondary | ICD-10-CM

## 2020-09-15 DIAGNOSIS — I10 Essential (primary) hypertension: Secondary | ICD-10-CM | POA: Diagnosis not present

## 2020-09-15 DIAGNOSIS — E785 Hyperlipidemia, unspecified: Secondary | ICD-10-CM | POA: Diagnosis not present

## 2020-09-15 DIAGNOSIS — I6523 Occlusion and stenosis of bilateral carotid arteries: Secondary | ICD-10-CM | POA: Diagnosis not present

## 2020-09-15 DIAGNOSIS — I739 Peripheral vascular disease, unspecified: Secondary | ICD-10-CM | POA: Diagnosis not present

## 2020-09-15 NOTE — Patient Instructions (Signed)
Medication Instructions:  NO CHANGES  *If you need a refill on your cardiac medications before your next appointment, please call your pharmacy*   Lab Work: NOT NEEDED    Testing/Procedures: NOT NEEDED   Follow-Up: At [facility], you and your health needs are our priority.  As part of our continuing mission to provide you with exceptional heart care, we have created designated Provider Care Teams.  These Care Teams include your primary Cardiologist (physician) and Advanced Practice Providers (APPs -  Physician Assistants and Nurse Practitioners) who all work together to provide you with the care you need, when you need it.     Your next appointment:   3 month(s)  The format for your next appointment:   In Person  Provider:   [name], MD

## 2020-09-15 NOTE — Progress Notes (Signed)
Cardiology Office Note   Date:  09/15/2020   ID:  Raeqwon, Lux May 23, 1946, MRN [id number]  PCP:  Leighton Ruff, MD  Cardiologist: Dr. [name]  No chief complaint on file.     History of Present Illness: [name] [name] is a 74 y.o. male who was referred by Dr. [name] for evaluation and management of peripheral arterial disease. He has known history of coronary artery disease status post CABG and subsequent PCI, aortic stenosis, essential hypertension, hyperlipidemia, diabetes mellitus, carotid disease and peripheral arterial disease.  He works as a Corporate treasurer. Due to recent right leg claudication, he underwent Doppler studies yesterday which showed an ABI of 0.85 in the right and 1.21 on the left.  Duplex showed severe calcified stenosis in the distal right common femoral artery with peak velocity close to 500.  No other obstructive disease.  There was moderate stenosis affecting the left common femoral artery as well as a protruding plaque from the posterior wall into the lumen in the distal portion of the artery. He reports that he had right leg pain 13 years ago after CABG that subsequently improved.  However, recently, he started noticing right leg fatigue as well as occasional cramping in the right side with overexertion.  No rest pain or lower extremity ulceration.  He is still able to do most activities of daily living.  He has moderate aortic stenosis.  He is not a smoker.   Past Medical History:  Diagnosis Date  . Arrhythmia    Premature beats  . Atherosclerosis of native arteries of the extremities with intermittent claudication   . Coronary atherosclerosis of artery bypass graft    CABG 2009, LHC 07/2016 restenosis of SVG to OM treated with DES  . Diabetes mellitus ([place])   . Essential hypertension, benign   . Pure hypercholesterolemia     Past Surgical History:  Procedure Laterality Date  . ADENOIDECTOMY    . CARDIAC CATHETERIZATION N/A 07/18/2016    Procedure: Left Heart Cath and Cors/Grafts Angiography;  Surgeon: Burnell Blanks, MD;  Location: [place] [facility];  Service: Cardiovascular;  Laterality: N/A;  . CARDIAC CATHETERIZATION N/A 07/18/2016   Procedure: Coronary Stent Intervention;  Surgeon: Burnell Blanks, MD;  Location: [place] [facility];  Service: Cardiovascular;  Laterality: N/A;  . CHOLECYSTECTOMY    . CORONARY ANGIOPLASTY WITH STENT PLACEMENT    . CORONARY ARTERY BYPASS GRAFT    . CORONARY STENT INTERVENTION N/A 01/11/2019   Procedure: CORONARY STENT INTERVENTION;  Surgeon: Sherren Mocha, MD;  Location: [place] [facility];  Service: Cardiovascular;  Laterality: N/A;  . INGUINAL HERNIA REPAIR Right   . LEFT HEART CATH AND CORS/GRAFTS ANGIOGRAPHY N/A 01/11/2019   Procedure: LEFT HEART CATH AND CORS/GRAFTS ANGIOGRAPHY;  Surgeon: Sherren Mocha, MD;  Location: [place] [facility];  Service: Cardiovascular;  Laterality: N/A;  . TONSILLECTOMY       Current Outpatient Medications  Medication Sig Dispense Refill  . amLODipine (NORVASC) 10 MG tablet TAKE 1 TABLET BY MOUTH EVERY DAY 90 tablet 2  . aspirin 81 MG EC tablet TAKE 1 TABLET BY MOUTH EVERY DAY 90 tablet 2  . benazepril (LOTENSIN) 40 MG tablet Take 40 mg by mouth daily.     . clopidogrel (PLAVIX) 75 MG tablet Take 1 tablet (75 mg total) by mouth daily. 90 tablet 3  . ezetimibe (ZETIA) 10 MG tablet Take 1 tablet (10 mg total) by mouth daily. 90 tablet 3  . glucose  blood (PRECISION QID TEST) test strip 1 each by Other route as directed.    . hydrochlorothiazide (HYDRODIURIL) 25 MG tablet TAKE 1 TABLET BY MOUTH EVERY DAY 90 tablet 2  . Insulin NPH, Human,, Isophane, (HUMULIN N) 100 UNIT/ML Kiwkpen Inject 15 Units into the skin in the morning.    . isosorbide mononitrate (IMDUR) 60 MG 24 hr tablet TAKE 1 TABLET BY MOUTH EVERY DAY 90 tablet 2  . metFORMIN (GLUCOPHAGE) 1000 MG tablet Take 1,000 mg by mouth 2 (two) times daily with a meal.    . metoprolol  tartrate (LOPRESSOR) 50 MG tablet TAKE 1.5 TABLETS (75 MG TOTAL) BY MOUTH 2 (TWO) TIMES DAILY. 270 tablet 3  . Multiple Vitamin (MULTIVITAMIN WITH MINERALS) TABS tablet Take 1 tablet by mouth daily.    . nitroGLYCERIN (NITROSTAT) 0.4 MG SL tablet PLACE 1 TABLET (0.4 MG TOTAL) UNDER THE TONGUE EVERY 5 (FIVE) MINUTES AS NEEDED FOR CHEST PAIN. 25 tablet 3  . rosuvastatin (CRESTOR) 20 MG tablet TAKE 1 TABLET BY MOUTH EVERY DAY 90 tablet 3   No current facility-administered medications for this visit.    Allergies:   Patient has no known allergies.    Social History:  The patient  reports that he has never smoked. He has never used smokeless tobacco. He reports that he does not drink alcohol and does not use drugs.   Family History:  The patient's family history includes Heart disease in his mother.    ROS:  Please see the history of present illness.   Otherwise, review of systems are positive for none.   All other systems are reviewed and negative.    PHYSICAL EXAM: VS:  BP 138/72   Pulse (!) 56   Ht 5' 9"  (1.753 m)   Wt 196 lb (88.9 kg)   SpO2 97%   BMI 28.94 kg/m  , BMI Body mass index is 28.94 kg/m. GEN: Well nourished, well developed, in no acute distress  HEENT: normal  Neck: no JVD, carotid bruits, or masses Cardiac: RRR; no  rubs, or gallops,no edema .  3 out of 6 systolic murmur in the aortic area which is mid peaking Respiratory:  clear to auscultation bilaterally, normal work of breathing GI: soft, nontender, nondistended, + BS MS: no deformity or atrophy  Skin: warm and dry, no rash Neuro:  Strength and sensation are intact Psych: euthymic mood, full affect Vascular: Femoral pulses +1 on the right and +2 on the left.  Distal pulses are +1 on the right and +2 on the left   EKG:  EKG is ordered today. The ekg ordered today demonstrates sinus bradycardia with no significant ST or T wave changes.   Recent Labs: 04/27/2020: ALT 21 08/31/2020: BUN 14; Creatinine, Ser  1.02; Potassium 4.1; Sodium 137    Lipid Panel    Component Value Date/Time   CHOL 129 04/27/2020 0824   TRIG 156 (H) 04/27/2020 0824   HDL 39 (L) 04/27/2020 0824   CHOLHDL 3.3 04/27/2020 0824   CHOLHDL 4.4 07/18/2016 0037   VLDL 44 (H) 07/18/2016 0037   LDLCALC 63 04/27/2020 0824      Wt Readings from Last 3 Encounters:  09/15/20 196 lb (88.9 kg)  08/31/20 198 lb (89.8 kg)  05/26/20 200 lb (90.7 kg)       No flowsheet data found.    ASSESSMENT AND PLAN:  1.  Peripheral arterial disease: Moderate to severe right leg claudication due to severe stenosis in the right common femoral  artery.  I discussed with him the natural history and management of claudication.  At the present time, he does not feel that his symptoms are lifestyle limiting.  He is going to monitor his symptoms and try to increase his walking and follow-up with me in 3 months to reevaluate.  He is already on good medical therapy. I explained to him that given that most likely he will require TAVR in the future, it would be best to address the subtotal occlusion in the right common femoral artery in order to preserve vascular access.  2.  Coronary artery disease involving native coronary arteries: Currently with no anginal symptoms.  He is on dual antiplatelet therapy.  3.  Hyperlipidemia: Currently on rosuvastatin 20 mg daily and Zetia.  Most recent lipid profile showed an LDL of 63.  4.  Essential hypertension: Blood pressure is controlled on current medications.    Disposition:   FU with me in 3 months  Signed,  [name], MD  09/15/2020 12:58 PM    [place]

## 2020-09-18 DIAGNOSIS — Z0000 Encounter for general adult medical examination without abnormal findings: Secondary | ICD-10-CM | POA: Diagnosis not present

## 2020-09-18 DIAGNOSIS — Z1389 Encounter for screening for other disorder: Secondary | ICD-10-CM | POA: Diagnosis not present

## 2020-09-21 DIAGNOSIS — E1165 Type 2 diabetes mellitus with hyperglycemia: Secondary | ICD-10-CM | POA: Diagnosis not present

## 2020-09-21 DIAGNOSIS — N400 Enlarged prostate without lower urinary tract symptoms: Secondary | ICD-10-CM | POA: Diagnosis not present

## 2020-09-21 DIAGNOSIS — I2510 Atherosclerotic heart disease of native coronary artery without angina pectoris: Secondary | ICD-10-CM | POA: Diagnosis not present

## 2020-09-21 DIAGNOSIS — I10 Essential (primary) hypertension: Secondary | ICD-10-CM | POA: Diagnosis not present

## 2020-09-21 DIAGNOSIS — E7800 Pure hypercholesterolemia, unspecified: Secondary | ICD-10-CM | POA: Diagnosis not present

## 2020-09-28 ENCOUNTER — Other Ambulatory Visit: Payer: Self-pay | Admitting: Cardiovascular Disease

## 2020-09-28 DIAGNOSIS — E119 Type 2 diabetes mellitus without complications: Secondary | ICD-10-CM | POA: Diagnosis not present

## 2020-09-28 DIAGNOSIS — E782 Mixed hyperlipidemia: Secondary | ICD-10-CM | POA: Diagnosis not present

## 2020-10-02 DIAGNOSIS — Z794 Long term (current) use of insulin: Secondary | ICD-10-CM | POA: Diagnosis not present

## 2020-10-02 DIAGNOSIS — I10 Essential (primary) hypertension: Secondary | ICD-10-CM | POA: Diagnosis not present

## 2020-10-02 DIAGNOSIS — E785 Hyperlipidemia, unspecified: Secondary | ICD-10-CM | POA: Diagnosis not present

## 2020-10-02 DIAGNOSIS — E1159 Type 2 diabetes mellitus with other circulatory complications: Secondary | ICD-10-CM | POA: Diagnosis not present

## 2020-10-23 DIAGNOSIS — I10 Essential (primary) hypertension: Secondary | ICD-10-CM | POA: Diagnosis not present

## 2020-10-23 DIAGNOSIS — E669 Obesity, unspecified: Secondary | ICD-10-CM | POA: Diagnosis not present

## 2020-10-23 DIAGNOSIS — Z6829 Body mass index (BMI) 29.0-29.9, adult: Secondary | ICD-10-CM | POA: Diagnosis not present

## 2020-10-23 DIAGNOSIS — E119 Type 2 diabetes mellitus without complications: Secondary | ICD-10-CM | POA: Diagnosis not present

## 2020-11-04 DIAGNOSIS — E1151 Type 2 diabetes mellitus with diabetic peripheral angiopathy without gangrene: Secondary | ICD-10-CM | POA: Diagnosis not present

## 2020-11-04 DIAGNOSIS — Z6830 Body mass index (BMI) 30.0-30.9, adult: Secondary | ICD-10-CM | POA: Diagnosis not present

## 2020-11-04 DIAGNOSIS — E1169 Type 2 diabetes mellitus with other specified complication: Secondary | ICD-10-CM | POA: Diagnosis not present

## 2020-11-04 DIAGNOSIS — I10 Essential (primary) hypertension: Secondary | ICD-10-CM | POA: Diagnosis not present

## 2020-11-04 DIAGNOSIS — E785 Hyperlipidemia, unspecified: Secondary | ICD-10-CM | POA: Diagnosis not present

## 2020-11-04 DIAGNOSIS — Z794 Long term (current) use of insulin: Secondary | ICD-10-CM | POA: Diagnosis not present

## 2020-11-04 DIAGNOSIS — I2510 Atherosclerotic heart disease of native coronary artery without angina pectoris: Secondary | ICD-10-CM | POA: Diagnosis not present

## 2020-11-04 DIAGNOSIS — E669 Obesity, unspecified: Secondary | ICD-10-CM | POA: Diagnosis not present

## 2020-11-20 DIAGNOSIS — L820 Inflamed seborrheic keratosis: Secondary | ICD-10-CM | POA: Diagnosis not present

## 2020-11-27 DIAGNOSIS — R972 Elevated prostate specific antigen [PSA]: Secondary | ICD-10-CM | POA: Diagnosis not present

## 2020-12-04 DIAGNOSIS — N401 Enlarged prostate with lower urinary tract symptoms: Secondary | ICD-10-CM | POA: Diagnosis not present

## 2020-12-04 DIAGNOSIS — R351 Nocturia: Secondary | ICD-10-CM | POA: Diagnosis not present

## 2020-12-04 DIAGNOSIS — R972 Elevated prostate specific antigen [PSA]: Secondary | ICD-10-CM | POA: Diagnosis not present

## 2020-12-10 DIAGNOSIS — I2510 Atherosclerotic heart disease of native coronary artery without angina pectoris: Secondary | ICD-10-CM | POA: Diagnosis not present

## 2020-12-10 DIAGNOSIS — N400 Enlarged prostate without lower urinary tract symptoms: Secondary | ICD-10-CM | POA: Diagnosis not present

## 2020-12-10 DIAGNOSIS — E7800 Pure hypercholesterolemia, unspecified: Secondary | ICD-10-CM | POA: Diagnosis not present

## 2020-12-10 DIAGNOSIS — I10 Essential (primary) hypertension: Secondary | ICD-10-CM | POA: Diagnosis not present

## 2020-12-10 DIAGNOSIS — E1165 Type 2 diabetes mellitus with hyperglycemia: Secondary | ICD-10-CM | POA: Diagnosis not present

## 2020-12-15 ENCOUNTER — Other Ambulatory Visit: Payer: Self-pay

## 2020-12-15 ENCOUNTER — Encounter: Payer: Self-pay | Admitting: Cardiovascular Disease

## 2020-12-15 ENCOUNTER — Ambulatory Visit (INDEPENDENT_AMBULATORY_CARE_PROVIDER_SITE_OTHER): Payer: Medicare Other | Admitting: Cardiovascular Disease

## 2020-12-15 VITALS — BP 120/56 | HR 58 | Ht 69.0 in | Wt 201.0 lb

## 2020-12-15 DIAGNOSIS — I739 Peripheral vascular disease, unspecified: Secondary | ICD-10-CM | POA: Diagnosis not present

## 2020-12-15 DIAGNOSIS — I359 Nonrheumatic aortic valve disorder, unspecified: Secondary | ICD-10-CM

## 2020-12-15 DIAGNOSIS — I10 Essential (primary) hypertension: Secondary | ICD-10-CM

## 2020-12-15 DIAGNOSIS — I25118 Atherosclerotic heart disease of native coronary artery with other forms of angina pectoris: Secondary | ICD-10-CM | POA: Diagnosis not present

## 2020-12-15 DIAGNOSIS — E785 Hyperlipidemia, unspecified: Secondary | ICD-10-CM | POA: Diagnosis not present

## 2020-12-15 NOTE — H&P (View-Only) (Signed)
Cardiology Office Note   Date:  12/15/2020   ID:  [name] [name], [name] [name] [birth year], MRN [id number]  PCP:  [name] Ruff, MD  Cardiologist: Dr. Burt [name]  No chief complaint on file.     History of Present Illness: [name] [name] is a 75 y.o. male who is here today for follow-up visit regarding peripheral arterial disease.   He has known history of coronary artery disease status post CABG and subsequent PCI, aortic stenosis, essential hypertension, hyperlipidemia, diabetes mellitus, carotid disease and peripheral arterial disease.  He works as a Corporate treasurer.  He was seen few months ago for right leg claudication.  Doppler studies showed an ABI of 0.85 in the right and 1.21 on the left.  Duplex showed severe calcified stenosis in the distal right common femoral artery with peak velocity close to 500.  No other obstructive disease.  There was moderate stenosis affecting the left common femoral artery as well as a protruding plaque from the posterior wall into the lumen in the distal portion of the artery. He continues to have severe right leg claudication.  In addition, he usually experiences some chest discomfort at the same time.  He has known stable angina.  No lower extremity ulceration.   Past Medical History:  Diagnosis Date  . Arrhythmia    Premature beats  . Atherosclerosis of native arteries of the extremities with intermittent claudication   . Coronary atherosclerosis of artery bypass graft    CABG 2009, LHC 07/2016 restenosis of SVG to OM treated with DES  . Diabetes mellitus ([place])   . Essential hypertension, benign   . Pure hypercholesterolemia     Past Surgical History:  Procedure Laterality Date  . ADENOIDECTOMY    . CARDIAC CATHETERIZATION N/A 07/18/2016   Procedure: Left Heart Cath and Cors/Grafts Angiography;  Surgeon: Burnell Blanks, MD;  Location: [facility];  Service: Cardiovascular;  Laterality: N/A;  . CARDIAC CATHETERIZATION N/A 07/18/2016    Procedure: Coronary Stent Intervention;  Surgeon: Burnell Blanks, MD;  Location: [place] [facility];  Service: Cardiovascular;  Laterality: N/A;  . CHOLECYSTECTOMY    . CORONARY ANGIOPLASTY WITH STENT PLACEMENT    . CORONARY ARTERY BYPASS GRAFT    . CORONARY STENT INTERVENTION N/A 01/11/2019   Procedure: CORONARY STENT INTERVENTION;  Surgeon: Sherren Mocha, MD;  Location: [place];  Service: Cardiovascular;  Laterality: N/A;  . INGUINAL HERNIA REPAIR Right   . LEFT HEART CATH AND CORS/GRAFTS ANGIOGRAPHY N/A 01/11/2019   Procedure: LEFT HEART CATH AND CORS/GRAFTS ANGIOGRAPHY;  Surgeon: Sherren Mocha, MD;  Location: [facility];  Service: Cardiovascular;  Laterality: N/A;  . TONSILLECTOMY       Current Outpatient Medications  Medication Sig Dispense Refill  . amLODipine (NORVASC) 10 MG tablet TAKE 1 TABLET BY MOUTH EVERY DAY 90 tablet 2  . aspirin 81 MG EC tablet TAKE 1 TABLET BY MOUTH EVERY DAY 90 tablet 2  . benazepril (LOTENSIN) 40 MG tablet Take 40 mg by mouth daily.     . clopidogrel (PLAVIX) 75 MG tablet Take 1 tablet (75 mg total) by mouth daily. 90 tablet 3  . ezetimibe (ZETIA) 10 MG tablet Take 1 tablet (10 mg total) by mouth daily. 90 tablet 3  . glucose blood (PRECISION QID TEST) test strip 1 each by Other route as directed.    . hydrochlorothiazide (HYDRODIURIL) 25 MG tablet TAKE 1 TABLET BY MOUTH EVERY DAY 90 tablet 3  . Insulin  NPH, Human,, Isophane, (HUMULIN N) 100 UNIT/ML Kiwkpen Inject 15 Units into the skin in the morning.    . isosorbide mononitrate (IMDUR) 60 MG 24 hr tablet TAKE 1 TABLET BY MOUTH EVERY DAY 90 tablet 2  . metFORMIN (GLUCOPHAGE) 1000 MG tablet Take 1,000 mg by mouth 2 (two) times daily with a meal.    . metoprolol tartrate (LOPRESSOR) 50 MG tablet TAKE 1.5 TABLETS (75 MG TOTAL) BY MOUTH 2 (TWO) TIMES DAILY. 270 tablet 3  . Multiple Vitamin (MULTIVITAMIN WITH MINERALS) TABS tablet Take 1 tablet by mouth daily.    .  nitroGLYCERIN (NITROSTAT) 0.4 MG SL tablet PLACE 1 TABLET (0.4 MG TOTAL) UNDER THE TONGUE EVERY 5 (FIVE) MINUTES AS NEEDED FOR CHEST PAIN. 25 tablet 3  . rosuvastatin (CRESTOR) 20 MG tablet TAKE 1 TABLET BY MOUTH EVERY DAY 90 tablet 3   No current facility-administered medications for this visit.    Allergies:   Patient has no known allergies.    Social History:  The patient  reports that he has never smoked. He has never used smokeless tobacco. He reports that he does not drink alcohol and does not use drugs.   Family History:  The patient's family history includes Heart disease in his mother.    ROS:  Please see the history of present illness.   Otherwise, review of systems are positive for none.   All other systems are reviewed and negative.    PHYSICAL EXAM: VS:  BP (!) 120/56   Pulse (!) 58   Ht 5' 9"  (1.753 m)   Wt 201 lb (91.2 kg)   SpO2 98%   BMI 29.68 kg/m  , BMI Body mass index is 29.68 kg/m. GEN: Well nourished, well developed, in no acute distress  HEENT: normal  Neck: no JVD, carotid bruits, or masses Cardiac: RRR; no  rubs, or gallops,no edema .  3 out of 6 systolic murmur in the aortic area which is mid peaking Respiratory:  clear to auscultation bilaterally, normal work of breathing GI: soft, nontender, nondistended, + BS MS: no deformity or atrophy  Skin: warm and dry, no rash Neuro:  Strength and sensation are intact Psych: euthymic mood, full affect Vascular: Femoral pulses +1 on the right and +2 on the left.  Distal pulses are +2 on the left and not palpable on the right   EKG:  EKG is not ordered today.    Recent Labs: 04/27/2020: ALT 21 08/31/2020: BUN 14; Creatinine, Ser 1.02; Potassium 4.1; Sodium 137    Lipid Panel    Component Value Date/Time   CHOL 129 04/27/2020 0824   TRIG 156 (H) 04/27/2020 0824   HDL 39 (L) 04/27/2020 0824   CHOLHDL 3.3 04/27/2020 0824   CHOLHDL 4.4 07/18/2016 0037   VLDL 44 (H) 07/18/2016 0037   LDLCALC 63  04/27/2020 0824      Wt Readings from Last 3 Encounters:  12/15/20 201 lb (91.2 kg)  09/15/20 196 lb (88.9 kg)  08/31/20 198 lb (89.8 kg)       No flowsheet data found.    ASSESSMENT AND PLAN:  1.  Peripheral arterial disease: Severe right leg claudication due to common femoral artery disease.  The patient has been trying to walk more but there has been no improvement in his symptoms.  Due to that, I recommend proceeding with abdominal aortogram with lower extremity runoff and possible endovascular intervention.  I suspect that he will require right femoral artery endarterectomy.  Planned access is  via the left common femoral artery.  2.  Coronary artery disease involving native coronary arteries: He has stable class II angina.  3.  Hyperlipidemia: Currently on rosuvastatin 20 mg daily and Zetia.  Most recent lipid profile showed an LDL of 63.  4.  Essential hypertension: Blood pressure is controlled on current medications.  5.  Moderate aortic stenosis: Suspect that he will require TAVR in the next few years.    Disposition:   FU with me in 1 months  Signed,  [name], MD  12/15/2020 1:29 PM    [facility]

## 2020-12-15 NOTE — Progress Notes (Signed)
Cardiology Office Note   Date:  12/15/2020   ID:  [name], [name] July 15, 1946, MRN [id number]  PCP:  Leighton Ruff, MD  Cardiologist: Dr. [name]  No chief complaint on file.     History of Present Illness: [name] [name] is a 75 y.o. male who is here today for follow-up visit regarding peripheral arterial disease.   He has known history of coronary artery disease status post CABG and subsequent PCI, aortic stenosis, essential hypertension, hyperlipidemia, diabetes mellitus, carotid disease and peripheral arterial disease.  He works as a Corporate treasurer.  He was seen few months ago for right leg claudication.  Doppler studies showed an ABI of 0.85 in the right and 1.21 on the left.  Duplex showed severe calcified stenosis in the distal right common femoral artery with peak velocity close to 500.  No other obstructive disease.  There was moderate stenosis affecting the left common femoral artery as well as a protruding plaque from the posterior wall into the lumen in the distal portion of the artery. He continues to have severe right leg claudication.  In addition, he usually experiences some chest discomfort at the same time.  He has known stable angina.  No lower extremity ulceration.   Past Medical History:  Diagnosis Date  . Arrhythmia    Premature beats  . Atherosclerosis of native arteries of the extremities with intermittent claudication   . Coronary atherosclerosis of artery bypass graft    CABG 2009, LHC 07/2016 restenosis of SVG to OM treated with DES  . Diabetes mellitus ([place])   . Essential hypertension, benign   . Pure hypercholesterolemia     Past Surgical History:  Procedure Laterality Date  . ADENOIDECTOMY    . CARDIAC CATHETERIZATION N/A 07/18/2016   Procedure: Left Heart Cath and Cors/Grafts Angiography;  Surgeon: Burnell Blanks, MD;  Location: [facility];  Service: Cardiovascular;  Laterality: N/A;  . CARDIAC CATHETERIZATION N/A 07/18/2016    Procedure: Coronary Stent Intervention;  Surgeon: Burnell Blanks, MD;  Location: [facility];  Service: Cardiovascular;  Laterality: N/A;  . CHOLECYSTECTOMY    . CORONARY ANGIOPLASTY WITH STENT PLACEMENT    . CORONARY ARTERY BYPASS GRAFT    . CORONARY STENT INTERVENTION N/A 01/11/2019   Procedure: CORONARY STENT INTERVENTION;  Surgeon: Sherren Mocha, MD;  Location: [facility];  Service: Cardiovascular;  Laterality: N/A;  . INGUINAL HERNIA REPAIR Right   . LEFT HEART CATH AND CORS/GRAFTS ANGIOGRAPHY N/A 01/11/2019   Procedure: LEFT HEART CATH AND CORS/GRAFTS ANGIOGRAPHY;  Surgeon: Sherren Mocha, MD;  Location: [facility];  Service: Cardiovascular;  Laterality: N/A;  . TONSILLECTOMY       Current Outpatient Medications  Medication Sig Dispense Refill  . amLODipine (NORVASC) 10 MG tablet TAKE 1 TABLET BY MOUTH EVERY DAY 90 tablet 2  . aspirin 81 MG EC tablet TAKE 1 TABLET BY MOUTH EVERY DAY 90 tablet 2  . benazepril (LOTENSIN) 40 MG tablet Take 40 mg by mouth daily.     . clopidogrel (PLAVIX) 75 MG tablet Take 1 tablet (75 mg total) by mouth daily. 90 tablet 3  . ezetimibe (ZETIA) 10 MG tablet Take 1 tablet (10 mg total) by mouth daily. 90 tablet 3  . glucose blood (PRECISION QID TEST) test strip 1 each by Other route as directed.    . hydrochlorothiazide (HYDRODIURIL) 25 MG tablet TAKE 1 TABLET BY MOUTH EVERY DAY 90 tablet 3  . Insulin  NPH, Human,, Isophane, (HUMULIN N) 100 UNIT/ML Kiwkpen Inject 15 Units into the skin in the morning.    . isosorbide mononitrate (IMDUR) 60 MG 24 hr tablet TAKE 1 TABLET BY MOUTH EVERY DAY 90 tablet 2  . metFORMIN (GLUCOPHAGE) 1000 MG tablet Take 1,000 mg by mouth 2 (two) times daily with a meal.    . metoprolol tartrate (LOPRESSOR) 50 MG tablet TAKE 1.5 TABLETS (75 MG TOTAL) BY MOUTH 2 (TWO) TIMES DAILY. 270 tablet 3  . Multiple Vitamin (MULTIVITAMIN WITH MINERALS) TABS tablet Take 1 tablet by mouth daily.    .  nitroGLYCERIN (NITROSTAT) 0.4 MG SL tablet PLACE 1 TABLET (0.4 MG TOTAL) UNDER THE TONGUE EVERY 5 (FIVE) MINUTES AS NEEDED FOR CHEST PAIN. 25 tablet 3  . rosuvastatin (CRESTOR) 20 MG tablet TAKE 1 TABLET BY MOUTH EVERY DAY 90 tablet 3   No current facility-administered medications for this visit.    Allergies:   Patient has no known allergies.    Social History:  The patient  reports that he has never smoked. He has never used smokeless tobacco. He reports that he does not drink alcohol and does not use drugs.   Family History:  The patient's family history includes Heart disease in his mother.    ROS:  Please see the history of present illness.   Otherwise, review of systems are positive for none.   All other systems are reviewed and negative.    PHYSICAL EXAM: VS:  BP (!) 120/56   Pulse (!) 58   Ht 5' 9"  (1.753 m)   Wt 201 lb (91.2 kg)   SpO2 98%   BMI 29.68 kg/m  , BMI Body mass index is 29.68 kg/m. GEN: Well nourished, well developed, in no acute distress  HEENT: normal  Neck: no JVD, carotid bruits, or masses Cardiac: RRR; no  rubs, or gallops,no edema .  3 out of 6 systolic murmur in the aortic area which is mid peaking Respiratory:  clear to auscultation bilaterally, normal work of breathing GI: soft, nontender, nondistended, + BS MS: no deformity or atrophy  Skin: warm and dry, no rash Neuro:  Strength and sensation are intact Psych: euthymic mood, full affect Vascular: Femoral pulses +1 on the right and +2 on the left.  Distal pulses are +2 on the left and not palpable on the right   EKG:  EKG is not ordered today.    Recent Labs: 04/27/2020: ALT 21 08/31/2020: BUN 14; Creatinine, Ser 1.02; Potassium 4.1; Sodium 137    Lipid Panel    Component Value Date/Time   CHOL 129 04/27/2020 0824   TRIG 156 (H) 04/27/2020 0824   HDL 39 (L) 04/27/2020 0824   CHOLHDL 3.3 04/27/2020 0824   CHOLHDL 4.4 07/18/2016 0037   VLDL 44 (H) 07/18/2016 0037   LDLCALC 63  04/27/2020 0824      Wt Readings from Last 3 Encounters:  12/15/20 201 lb (91.2 kg)  09/15/20 196 lb (88.9 kg)  08/31/20 198 lb (89.8 kg)       No flowsheet data found.    ASSESSMENT AND PLAN:  1.  Peripheral arterial disease: Severe right leg claudication due to common femoral artery disease.  The patient has been trying to walk more but there has been no improvement in his symptoms.  Due to that, I recommend proceeding with abdominal aortogram with lower extremity runoff and possible endovascular intervention.  I suspect that he will require right femoral artery endarterectomy.  Planned access is  via the left common femoral artery.  2.  Coronary artery disease involving native coronary arteries: He has stable class II angina.  3.  Hyperlipidemia: Currently on rosuvastatin 20 mg daily and Zetia.  Most recent lipid profile showed an LDL of 63.  4.  Essential hypertension: Blood pressure is controlled on current medications.  5.  Moderate aortic stenosis: Suspect that he will require TAVR in the next few years.    Disposition:   FU with me in 1 months  Signed,  [name], MD  12/15/2020 1:29 PM    [facility]

## 2020-12-15 NOTE — Patient Instructions (Signed)
Medication Instructions:  No changes *If you need a refill on your cardiac medications before your next appointment, please call your pharmacy*   Testing/Procedures: Your physician has requested that you have a peripheral vascular angiogram. This exam is performed at the hospital. During this exam IV contrast is used to look at arterial blood flow. Please review the information sheet given for details.   Follow-Up: At [facility], you and your health needs are our priority.  As part of our continuing mission to provide you with exceptional heart care, we have created designated Provider Care Teams.  These Care Teams include your primary Cardiologist (physician) and Advanced Practice Providers (APPs -  Physician Assistants and Nurse Practitioners) who all work together to provide you with the care you need, when you need it.  We recommend signing up for the patient portal called "MyChart".  Sign up information is provided on this After Visit Summary.  MyChart is used to connect with patients for Virtual Visits (Telemedicine).  Patients are able to view lab/test results, encounter notes, upcoming appointments, etc.  Non-urgent messages can be sent to your provider as well.   To learn more about what you can do with MyChart, go to [email or web address].    Your next appointment:   Follow up on 02/02/21 at 10:20 with Dr. Fletcher Anon  Other Instructions    [facility] CARDIOVASCULAR DIVISION [facility] [place] [address] Dept: [phone] Loc: [place]  12/15/2020  You are scheduled for a Peripheral Angiogram on Wednesday, March 16 with Dr. [name].  1. Please arrive at the [facility] (Main Entrance A) at [facility]: [address] [place], [place] [address] at 6:30 AM (This time is two hours before your procedure to ensure your preparation). Free valet parking service is available.    Special note: Every effort is made to have your procedure done on time. Please understand that emergencies sometimes delay scheduled procedures.  2. Diet: Do not eat solid foods after midnight.  The patient may have clear liquids until 5am upon the day of the procedure.  3. Labs: You will need to have blood drawn today 12/15/20.  You will need to have the coronavirus test completed prior to your procedure. An appointment has been made at 11:50 am on 12/26/20. This is a Drive Up Visit at [address], [place], [place] [address]. Please tell them that you are there for procedure testing. Stay in your car and someone will be with you shortly. Please make sure to have all other labs completed before this test because you will need to stay quarantined until your procedure.   4. Medication instructions in preparation for your procedure: Hold all diabetic medication the morning of the procedure Hold the Metformin the morning of the procedure and then 48 hours after. Take half the dose of the Insulin NPH the night before the procedure Hold the Hydrochlorothiazide the morning of the procedure  On the morning of your procedure, take your Aspirin and Plavix/Clopidogrel and any morning medicines NOT listed above.  You may use sips of water.  5. Plan for one night stay--bring personal belongings. 6. Bring a current list of your medications and current insurance cards. 7. You MUST have a responsible person to drive you home. 8. Someone MUST be with you the first 24 hours after you arrive home or your discharge will be delayed. 9. Please wear clothes that are easy to  get on and off and wear slip-on shoes.  Thank you for allowing [place] to care for you!   -- [facility]

## 2020-12-22 DIAGNOSIS — L820 Inflamed seborrheic keratosis: Secondary | ICD-10-CM | POA: Diagnosis not present

## 2020-12-25 ENCOUNTER — Other Ambulatory Visit: Payer: Self-pay | Admitting: Cardiovascular Disease

## 2020-12-25 DIAGNOSIS — I10 Essential (primary) hypertension: Secondary | ICD-10-CM | POA: Diagnosis not present

## 2020-12-25 DIAGNOSIS — I739 Peripheral vascular disease, unspecified: Secondary | ICD-10-CM | POA: Diagnosis not present

## 2020-12-25 LAB — CBC
Hematocrit: 46.5 % (ref 37.5–51.0)
Hemoglobin: 14.8 g/dL (ref 13.0–17.7)
MCH: 28.4 pg (ref 26.6–33.0)
MCHC: 31.8 g/dL (ref 31.5–35.7)
MCV: 89 fL (ref 79–97)
Platelets: 299 10*3/uL (ref 150–450)
RBC: 5.22 x10E6/uL (ref 4.14–5.80)
RDW: 13.3 % (ref 11.6–15.4)
WBC: 8.1 10*3/uL (ref 3.4–10.8)

## 2020-12-25 LAB — BASIC METABOLIC PANEL
BUN/Creatinine Ratio: 20 (ref 10–24)
BUN: 21 mg/dL (ref 8–27)
CO2: 19 mmol/L — ABNORMAL LOW (ref 20–29)
Calcium: 10.5 mg/dL — ABNORMAL HIGH (ref 8.6–10.2)
Chloride: 100 mmol/L (ref 96–106)
Creatinine, Ser: 1.04 mg/dL (ref 0.76–1.27)
Glucose: 147 mg/dL — ABNORMAL HIGH (ref 65–99)
Potassium: 4.4 mmol/L (ref 3.5–5.2)
Sodium: 139 mmol/L (ref 134–144)
eGFR: 74 mL/min/{1.73_m2} (ref >59)

## 2020-12-26 ENCOUNTER — Inpatient Hospital Stay (HOSPITAL_COMMUNITY): Admission: RE | Admit: 2020-12-26 | Payer: Medicare Other | Source: Ambulatory Visit

## 2020-12-26 LAB — CBC
Hematocrit: 44.5 % (ref 37.5–51.0)
Hemoglobin: 14.7 g/dL (ref 13.0–17.7)
MCH: 28.5 pg (ref 26.6–33.0)
MCHC: 33 g/dL (ref 31.5–35.7)
MCV: 86 fL (ref 79–97)
Platelets: 293 10*3/uL (ref 150–450)
RBC: 5.15 x10E6/uL (ref 4.14–5.80)
RDW: 13.1 % (ref 11.6–15.4)
WBC: 7.4 10*3/uL (ref 3.4–10.8)

## 2020-12-26 LAB — BASIC METABOLIC PANEL
BUN/Creatinine Ratio: 17 (ref 10–24)
BUN: 18 mg/dL (ref 8–27)
CO2: 21 mmol/L (ref 20–29)
Calcium: 10.1 mg/dL (ref 8.6–10.2)
Chloride: 99 mmol/L (ref 96–106)
Creatinine, Ser: 1.07 mg/dL (ref 0.76–1.27)
Glucose: 201 mg/dL — ABNORMAL HIGH (ref 65–99)
Potassium: 4.4 mmol/L (ref 3.5–5.2)
Sodium: 138 mmol/L (ref 134–144)
eGFR: 71 mL/min/{1.73_m2} (ref >59)

## 2020-12-28 ENCOUNTER — Other Ambulatory Visit (HOSPITAL_COMMUNITY)
Admission: RE | Admit: 2020-12-28 | Discharge: 2020-12-28 | Disposition: A | Discharge status: Home / Self Care | Payer: Medicare Other | Source: Ambulatory Visit | Attending: Cardiovascular Disease | Admitting: Cardiovascular Disease

## 2020-12-28 DIAGNOSIS — Z20822 Contact with and (suspected) exposure to covid-19: Secondary | ICD-10-CM | POA: Insufficient documentation

## 2020-12-28 DIAGNOSIS — Z01812 Encounter for preprocedural laboratory examination: Secondary | ICD-10-CM | POA: Diagnosis not present

## 2020-12-28 LAB — SARS CORONAVIRUS 2 (TAT 6-24 HRS): SARS Coronavirus 2: NEGATIVE

## 2020-12-29 ENCOUNTER — Telehealth: Payer: Self-pay | Admitting: *Deleted

## 2020-12-29 NOTE — Telephone Encounter (Signed)
Pt contacted pre-abdominal aortogram scheduled at [facility] for: Wednesday December 30, 2020 8:30 AM Verified arrival time and place: [place] [facility]) at: 6:30 AM   No solid food after midnight prior to cath, clear liquids until 5 AM day of procedure.  Hold: Insulin-AM of procedure/1/2 Insulin HS prior to procedure  Metformin-day of procedure and 48 hours post procedure HCTZ-AM of procedure  Except hold medications AM meds can be  taken pre-cath with sips of water including: ASA 81 mg Plavix 75 mg   Confirmed patient has responsible adult to drive home post procedure and be with patient first 24 hours after arriving home: yes  You are allowed ONE visitor in the waiting room during the time you are at the hospital for your procedure. Both you and your visitor must wear a mask once you enter the hospital.    Reviewed procedure/mask/visitor instructions with patient.

## 2020-12-30 ENCOUNTER — Encounter: Payer: Self-pay | Admitting: Vascular Surgery

## 2020-12-30 ENCOUNTER — Ambulatory Visit (HOSPITAL_COMMUNITY)
Admission: RE | Admit: 2020-12-30 | Discharge: 2020-12-30 | Disposition: A | Discharge status: Home / Self Care | Payer: Medicare Other | Attending: Cardiovascular Disease | Admitting: Cardiovascular Disease

## 2020-12-30 ENCOUNTER — Other Ambulatory Visit: Payer: Self-pay

## 2020-12-30 ENCOUNTER — Encounter (HOSPITAL_COMMUNITY): Payer: Self-pay | Admitting: Cardiovascular Disease

## 2020-12-30 ENCOUNTER — Encounter (HOSPITAL_COMMUNITY)
Admission: RE | Disposition: A | Discharge status: Home / Self Care | Payer: Self-pay | Source: Home / Self Care | Attending: Cardiovascular Disease

## 2020-12-30 DIAGNOSIS — Z7984 Long term (current) use of oral hypoglycemic drugs: Secondary | ICD-10-CM | POA: Diagnosis not present

## 2020-12-30 DIAGNOSIS — Z951 Presence of aortocoronary bypass graft: Secondary | ICD-10-CM | POA: Diagnosis not present

## 2020-12-30 DIAGNOSIS — Z8249 Family history of ischemic heart disease and other diseases of the circulatory system: Secondary | ICD-10-CM | POA: Insufficient documentation

## 2020-12-30 DIAGNOSIS — Z7902 Long term (current) use of antithrombotics/antiplatelets: Secondary | ICD-10-CM | POA: Insufficient documentation

## 2020-12-30 DIAGNOSIS — Z794 Long term (current) use of insulin: Secondary | ICD-10-CM | POA: Diagnosis not present

## 2020-12-30 DIAGNOSIS — I70211 Atherosclerosis of native arteries of extremities with intermittent claudication, right leg: Secondary | ICD-10-CM

## 2020-12-30 DIAGNOSIS — Z7982 Long term (current) use of aspirin: Secondary | ICD-10-CM | POA: Insufficient documentation

## 2020-12-30 DIAGNOSIS — I350 Nonrheumatic aortic (valve) stenosis: Secondary | ICD-10-CM | POA: Diagnosis not present

## 2020-12-30 DIAGNOSIS — E1151 Type 2 diabetes mellitus with diabetic peripheral angiopathy without gangrene: Secondary | ICD-10-CM | POA: Insufficient documentation

## 2020-12-30 DIAGNOSIS — I10 Essential (primary) hypertension: Secondary | ICD-10-CM | POA: Insufficient documentation

## 2020-12-30 DIAGNOSIS — E785 Hyperlipidemia, unspecified: Secondary | ICD-10-CM | POA: Diagnosis not present

## 2020-12-30 DIAGNOSIS — Z79899 Other long term (current) drug therapy: Secondary | ICD-10-CM | POA: Insufficient documentation

## 2020-12-30 DIAGNOSIS — I25118 Atherosclerotic heart disease of native coronary artery with other forms of angina pectoris: Secondary | ICD-10-CM | POA: Diagnosis not present

## 2020-12-30 DIAGNOSIS — I739 Peripheral vascular disease, unspecified: Secondary | ICD-10-CM

## 2020-12-30 HISTORY — PX: ABDOMINAL AORTOGRAM W/LOWER EXTREMITY: CATH118223

## 2020-12-30 LAB — GLUCOSE, CAPILLARY
Glucose-Capillary: 195 mg/dL — ABNORMAL HIGH (ref 70–99)
Glucose-Capillary: 191 mg/dL — ABNORMAL HIGH (ref 70–99)

## 2020-12-30 SURGERY — ABDOMINAL AORTOGRAM W/LOWER EXTREMITY
Anesthesia: LOCAL

## 2020-12-30 MED ORDER — SODIUM CHLORIDE 0.9% FLUSH: 3.0000 mL | Freq: Two times a day (BID) | INTRAVENOUS | Status: DC

## 2020-12-30 MED ORDER — SODIUM CHLORIDE 0.9 % IV SOLN: 250.0000 mL | INTRAVENOUS | Status: DC | PRN

## 2020-12-30 MED ORDER — HEPARIN (PORCINE) IN NACL 1000-0.9 UT/500ML-% IV SOLN
INTRAVENOUS | Status: DC | PRN
  Administered 2020-12-30 (×2): 500 mL

## 2020-12-30 MED ORDER — IODIXANOL 320 MG/ML IV SOLN
INTRAVENOUS | Status: DC | PRN
  Administered 2020-12-30: 75 mL

## 2020-12-30 MED ORDER — SODIUM CHLORIDE 0.9% FLUSH: 3.0000 mL | INTRAVENOUS | Status: DC | PRN

## 2020-12-30 MED ORDER — MIDAZOLAM HCL 2 MG/2ML IJ SOLN
INTRAMUSCULAR | Status: AC
  Filled 2020-12-30: qty 2

## 2020-12-30 MED ORDER — SODIUM CHLORIDE 0.9 % IV SOLN: INTRAVENOUS | Status: DC

## 2020-12-30 MED ORDER — HEPARIN (PORCINE) IN NACL 1000-0.9 UT/500ML-% IV SOLN
INTRAVENOUS | Status: AC
  Filled 2020-12-30: qty 1500

## 2020-12-30 MED ORDER — MIDAZOLAM HCL 2 MG/2ML IJ SOLN
INTRAMUSCULAR | Status: DC | PRN
  Administered 2020-12-30: 1 mg via INTRAVENOUS

## 2020-12-30 MED ORDER — FENTANYL CITRATE (PF) 100 MCG/2ML IJ SOLN
INTRAMUSCULAR | Status: DC | PRN
  Administered 2020-12-30: 25 ug via INTRAVENOUS

## 2020-12-30 MED ORDER — ACETAMINOPHEN 325 MG PO TABS: 650.0000 mg | ORAL_TABLET | ORAL | Status: DC | PRN

## 2020-12-30 MED ORDER — LIDOCAINE HCL (PF) 1 % IJ SOLN
INTRAMUSCULAR | Status: DC | PRN
  Administered 2020-12-30: 18 mL

## 2020-12-30 MED ORDER — FENTANYL CITRATE (PF) 100 MCG/2ML IJ SOLN
INTRAMUSCULAR | Status: AC
  Filled 2020-12-30: qty 2

## 2020-12-30 MED ORDER — LIDOCAINE HCL (PF) 1 % IJ SOLN
INTRAMUSCULAR | Status: AC
  Filled 2020-12-30: qty 30

## 2020-12-30 MED ORDER — ASPIRIN 81 MG PO CHEW: 81.0000 mg | CHEWABLE_TABLET | ORAL | Status: DC

## 2020-12-30 MED ORDER — LABETALOL HCL 5 MG/ML IV SOLN: 10.0000 mg | INTRAVENOUS | Status: DC | PRN

## 2020-12-30 MED ORDER — ONDANSETRON HCL 4 MG/2ML IJ SOLN: 4.0000 mg | Freq: Four times a day (QID) | INTRAMUSCULAR | Status: DC | PRN

## 2020-12-30 SURGICAL SUPPLY — 15 items
CATH ANGIO 5F PIGTAIL 65CM (CATHETERS) ×2 IMPLANT
CATH CROSS OVER TEMPO 5F (CATHETERS) ×2 IMPLANT
CATH STRAIGHT 5FR 65CM (CATHETERS) ×2 IMPLANT
DEVICE CLOSURE MYNXGRIP 5F (Vascular Products) ×2 IMPLANT
GLIDEWIRE ADV .035X260CM (WIRE) ×2 IMPLANT
KIT MICROPUNCTURE NIT STIFF (SHEATH) ×2 IMPLANT
KIT PV (KITS) ×2 IMPLANT
SHEATH PINNACLE 5F 10CM (SHEATH) ×2 IMPLANT
SHEATH PROBE COVER 6X72 (BAG) ×2 IMPLANT
STOPCOCK MORSE 400PSI 3WAY (MISCELLANEOUS) ×2 IMPLANT
SYR MEDRAD MARK 7 150ML (SYRINGE) ×2 IMPLANT
TRANSDUCER W/STOPCOCK (MISCELLANEOUS) ×2 IMPLANT
TRAY PV CATH (CUSTOM PROCEDURE TRAY) ×2 IMPLANT
TUBING CIL FLEX 10 FLL-RA (TUBING) ×2 IMPLANT
WIRE HITORQ VERSACORE ST 145CM (WIRE) ×2 IMPLANT

## 2020-12-30 NOTE — Progress Notes (Signed)
Up and walked and tolerated well; left groin stable, no bleeding or hematoma 

## 2020-12-30 NOTE — Interval H&P Note (Signed)
History and Physical Interval Note:  12/30/2020 8:40 AM  [name] [name]  has presented today for surgery, with the diagnosis of pad.  The various methods of treatment have been discussed with the patient and family. After consideration of risks, benefits and other options for treatment, the patient has consented to  Procedure(s): ABDOMINAL AORTOGRAM W/LOWER EXTREMITY (N/A) as a surgical intervention.  The patient's history has been reviewed, patient examined, no change in status, stable for surgery.  I have reviewed the patient's chart and labs.  Questions were answered to the patient's satisfaction.     [name]

## 2020-12-30 NOTE — Discharge Instructions (Signed)
NO METFORMIN/ GLUCOPHAGE FOR 2 DAYS     Femoral Site Care  This sheet gives you information about how to care for yourself after your procedure. Your health care provider may also give you more specific instructions. If you have problems or questions, contact your health care provider. What can I expect after the procedure? After the procedure, it is common to have:  Bruising that usually fades within 1-2 weeks.  Tenderness at the site. Follow these instructions at home: Wound care  Follow instructions from your health care provider about how to take care of your insertion site. Make sure you: ? Wash your hands with soap and water before you change your bandage (dressing). If soap and water are not available, use hand sanitizer. ? Change your dressing as told by your health care provider. ? Leave stitches (sutures), skin glue, or adhesive strips in place. These skin closures may need to stay in place for 2 weeks or longer. If adhesive strip edges start to loosen and curl up, you may trim the loose edges. Do not remove adhesive strips completely unless your health care provider tells you to do that.  Do not take baths, swim, or use a hot tub until your health care provider approves.  You may shower 24-48 hours after the procedure or as told by your health care provider. ? Gently wash the site with plain soap and water. ? Pat the area dry with a clean towel. ? Do not rub the site. This may cause bleeding.  Do not apply powder or lotion to the site. Keep the site clean and dry.  Check your femoral site every day for signs of infection. Check for: ? Redness, swelling, or pain. ? Fluid or blood. ? Warmth. ? Pus or a bad smell. Activity  For the first 2-3 days after your procedure, or as long as directed: ? Avoid climbing stairs as much as possible. ? Do not squat.  Do not lift anything that is heavier than 10 lb (4.5 kg), or the limit that you are told, until your health care  provider says that it is safe.  Rest as directed. ? Avoid sitting for a long time without moving. Get up to take short walks every 1-2 hours.  Do not drive for 24 hours if you were given a medicine to help you relax (sedative). General instructions  Take over-the-counter and prescription medicines only as told by your health care provider.  Keep all follow-up visits as told by your health care provider. This is important. Contact a health care provider if you have:  A fever or chills.  You have redness, swelling, or pain around your insertion site. Get help right away if:  The catheter insertion area swells very fast.  You pass out.  You suddenly start to sweat or your skin gets clammy.  The catheter insertion area is bleeding, and the bleeding does not stop when you hold steady pressure on the area.  The area near or just beyond the catheter insertion site becomes pale, cool, tingly, or numb. These symptoms may represent a serious problem that is an emergency. Do not wait to see if the symptoms will go away. Get medical help right away. Call your local emergency services (911 in the [place] Do not drive yourself to the hospital. Summary  After the procedure, it is common to have bruising that usually fades within 1-2 weeks.  Check your femoral site every day for signs of infection.  Do not lift  anything that is heavier than 10 lb (4.5 kg), or the limit that you are told, until your health care provider says that it is safe. This information is not intended to replace advice given to you by your health care provider. Make sure you discuss any questions you have with your health care provider. Document Revised: 06/05/2020 Document Reviewed: 06/05/2020 Elsevier Patient Education  [place].

## 2020-12-30 NOTE — H&P (View-Only) (Signed)
[facility] [place]  Patient name: [name] [name] MRN: [id number] DOB: [birth year] Sex: male    HPI: [name] [name] is a 75 y.o. male seen in consultation for limiting intermittent claudication.  He is in the post Cath Lab procedure holding area.  He is with his wife.  Is very pleasant active 71 year old gentleman with increasingly limiting claudication to his right leg.  He reports that he has noticed this for 12 years since his coronary bypass grafting but has become increasingly severe recently.  He remains quite active and reports total leg claudication on the right with walking.  He does not have any left leg symptoms.  He denies any tissue loss.  He has been stable from a cardiac standpoint.  He is diabetic and hypertensive.  He is not a cigarette smoke  Past Medical History:  Diagnosis Date  . Arrhythmia    Premature beats  . Atherosclerosis of native arteries of the extremities with intermittent claudication   . Coronary atherosclerosis of artery bypass graft    CABG 2009, LHC 07/2016 restenosis of SVG to OM treated with DES  . Diabetes mellitus ([place])   . Essential hypertension, benign   . Pure hypercholesterolemia     Family History  Problem Relation Age of Onset  . Heart disease Mother        No details  . Diabetes Neg Hx     SOCIAL HISTORY: Social History   Tobacco Use  . Smoking status: Never Smoker  . Smokeless tobacco: Never Used  Substance Use Topics  . Alcohol use: No    Allergies  Allergen Reactions  . Empagliflozin Other (See Comments)    Abdominal pain/dizziness    No current facility-administered medications for this encounter.   Current Outpatient Medications  Medication Sig Dispense Refill  . amLODipine (NORVASC) 10 MG tablet TAKE 1 TABLET BY MOUTH EVERY DAY (Patient taking differently: Take 10 mg by mouth daily.) 90 tablet 2  . aspirin 81 MG EC tablet TAKE 1 TABLET BY MOUTH EVERY DAY 90  tablet 2  . benazepril (LOTENSIN) 40 MG tablet Take 40 mg by mouth daily.     . clopidogrel (PLAVIX) 75 MG tablet Take 1 tablet (75 mg total) by mouth daily. 90 tablet 3  . ezetimibe (ZETIA) 10 MG tablet Take 1 tablet (10 mg total) by mouth daily. 90 tablet 3  . hydrochlorothiazide (HYDRODIURIL) 25 MG tablet TAKE 1 TABLET BY MOUTH EVERY DAY 90 tablet 3  . Insulin NPH, Human,, Isophane, (NOVOLIN N FLEXPEN RELION) 100 UNIT/ML Kiwkpen Inject 10 Units into the skin in the morning and at bedtime.    . isosorbide mononitrate (IMDUR) 60 MG 24 hr tablet TAKE 1 TABLET BY MOUTH EVERY DAY (Patient taking differently: Take 60 mg by mouth daily.) 90 tablet 2  . metFORMIN (GLUCOPHAGE) 1000 MG tablet Take 1,000 mg by mouth 2 (two) times daily with a meal.    . metoprolol tartrate (LOPRESSOR) 50 MG tablet TAKE 1.5 TABLETS (75 MG TOTAL) BY MOUTH 2 (TWO) TIMES DAILY. 270 tablet 3  . Multiple Vitamin (MULTIVITAMIN WITH MINERALS) TABS tablet Take 1 tablet by mouth daily.    . rosuvastatin (CRESTOR) 20 MG tablet TAKE 1 TABLET BY MOUTH EVERY DAY (Patient taking differently: Take 20 mg by mouth daily.) 90 tablet 3  . vitamin B-12 (CYANOCOBALAMIN) 500 MCG tablet Take 500 mcg by mouth daily.    . Zinc 50 MG TABS Take 50 mg by mouth 2 (  two) times a week.    Marland Kitchen glucose blood (PRECISION QID TEST) test strip 1 each by Other route as directed.    . nitroGLYCERIN (NITROSTAT) 0.4 MG SL tablet PLACE 1 TABLET (0.4 MG TOTAL) UNDER THE TONGUE EVERY 5 (FIVE) MINUTES AS NEEDED FOR CHEST PAIN. (Patient taking differently: Place 0.4 mg under the tongue every 5 (five) minutes x 3 doses as needed for chest pain.) 25 tablet 3    REVIEW OF SYSTEMS:  [X]  denotes positive finding, [ ]  denotes negative finding Cardiac  Comments:  Chest pain or chest pressure:    Shortness of breath upon exertion:    Short of breath when lying flat:    Irregular heart rhythm:        Vascular    Pain in calf, thigh, or hip brought on by ambulation: x    Pain in feet at night that wakes you up from your sleep:     Blood clot in your veins:    Leg swelling:           PHYSICAL EXAM: Vitals:   12/30/20 1024 12/30/20 1054 12/30/20 1124 12/30/20 1125  BP: 133/87 (!) 141/68 126/66   Pulse: 78 79 72 72  Resp:      Temp:      TempSrc:      SpO2: 98% 95% 96% 96%  Weight:      Height:        GENERAL: The patient is a well-nourished male, in no acute distress. The vital signs are documented above. CARDIOVASCULAR: He does have palpable femoral pulses.  He has a 2+ dorsalis pedis pulse and 1+ right dorsalis pedis pulse PULMONARY: There is good air exchange  MUSCULOSKELETAL: There are no major deformities or cyanosis. NEUROLOGIC: No focal weakness or paresthesias are detected. SKIN: There are no ulcers or rashes noted. PSYCHIATRIC: The patient has a normal affect.  DATA:  Arteriogram from today was reviewed.  This shows essentially normal aortoiliac segments bilaterally.  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  The superficial artery has mild disease in the mid portion.  Popliteal artery is normal on the right and he does have diffuse tibial disease on the right.  On the left he has mild irregular plaque in his left groin.  MEDICAL ISSUES: I had long discussion with the patient and his wife present.  I explained that this is not a limb threatening situation.  I did explain that he should correct his claudication with right femoral endarterectomy and patch angioplasty.  Explained the magnitude of the procedure and expected excellent long-term durability.  He reports that he is very uncomfortable with his walking ability and wished to proceed with surgery.  We will schedule this at his convenience as an outpatient    Rosetta Posner, MD FACS [facility] [place] Office Tel [phone]  Note: Portions of this report may have been transcribed  using voice recognition software.  Every effort has been made to ensure accuracy; however, inadvertent computerized transcription errors may still be present.

## 2020-12-30 NOTE — Consult Note (Signed)
[facility] [place]  Patient name: [name] [name] MRN: [id number] DOB: [birth year] Sex: male    HPI: [name] [name] is a 75 y.o. male seen in consultation for limiting intermittent claudication.  He is in the post Cath Lab procedure holding area.  He is with his wife.  Is very pleasant active 89 year old gentleman with increasingly limiting claudication to his right leg.  He reports that he has noticed this for 12 years since his coronary bypass grafting but has become increasingly severe recently.  He remains quite active and reports total leg claudication on the right with walking.  He does not have any left leg symptoms.  He denies any tissue loss.  He has been stable from a cardiac standpoint.  He is diabetic and hypertensive.  He is not a cigarette smoke  Past Medical History:  Diagnosis Date  . Arrhythmia    Premature beats  . Atherosclerosis of native arteries of the extremities with intermittent claudication   . Coronary atherosclerosis of artery bypass graft    CABG 2009, LHC 07/2016 restenosis of SVG to OM treated with DES  . Diabetes mellitus ([place])   . Essential hypertension, benign   . Pure hypercholesterolemia     Family History  Problem Relation Age of Onset  . Heart disease Mother        No details  . Diabetes Neg Hx     SOCIAL HISTORY: Social History   Tobacco Use  . Smoking status: Never Smoker  . Smokeless tobacco: Never Used  Substance Use Topics  . Alcohol use: No    Allergies  Allergen Reactions  . Empagliflozin Other (See Comments)    Abdominal pain/dizziness    No current facility-administered medications for this encounter.   Current Outpatient Medications  Medication Sig Dispense Refill  . amLODipine (NORVASC) 10 MG tablet TAKE 1 TABLET BY MOUTH EVERY DAY (Patient taking differently: Take 10 mg by mouth daily.) 90 tablet 2  . aspirin 81 MG EC tablet TAKE 1 TABLET BY MOUTH EVERY DAY 90  tablet 2  . benazepril (LOTENSIN) 40 MG tablet Take 40 mg by mouth daily.     . clopidogrel (PLAVIX) 75 MG tablet Take 1 tablet (75 mg total) by mouth daily. 90 tablet 3  . ezetimibe (ZETIA) 10 MG tablet Take 1 tablet (10 mg total) by mouth daily. 90 tablet 3  . hydrochlorothiazide (HYDRODIURIL) 25 MG tablet TAKE 1 TABLET BY MOUTH EVERY DAY 90 tablet 3  . Insulin NPH, Human,, Isophane, (NOVOLIN N FLEXPEN RELION) 100 UNIT/ML Kiwkpen Inject 10 Units into the skin in the morning and at bedtime.    . isosorbide mononitrate (IMDUR) 60 MG 24 hr tablet TAKE 1 TABLET BY MOUTH EVERY DAY (Patient taking differently: Take 60 mg by mouth daily.) 90 tablet 2  . metFORMIN (GLUCOPHAGE) 1000 MG tablet Take 1,000 mg by mouth 2 (two) times daily with a meal.    . metoprolol tartrate (LOPRESSOR) 50 MG tablet TAKE 1.5 TABLETS (75 MG TOTAL) BY MOUTH 2 (TWO) TIMES DAILY. 270 tablet 3  . Multiple Vitamin (MULTIVITAMIN WITH MINERALS) TABS tablet Take 1 tablet by mouth daily.    . rosuvastatin (CRESTOR) 20 MG tablet TAKE 1 TABLET BY MOUTH EVERY DAY (Patient taking differently: Take 20 mg by mouth daily.) 90 tablet 3  . vitamin B-12 (CYANOCOBALAMIN) 500 MCG tablet Take 500 mcg by mouth daily.    . Zinc 50 MG TABS Take 50 mg by mouth 2 (  two) times a week.    Marland Kitchen glucose blood (PRECISION QID TEST) test strip 1 each by Other route as directed.    . nitroGLYCERIN (NITROSTAT) 0.4 MG SL tablet PLACE 1 TABLET (0.4 MG TOTAL) UNDER THE TONGUE EVERY 5 (FIVE) MINUTES AS NEEDED FOR CHEST PAIN. (Patient taking differently: Place 0.4 mg under the tongue every 5 (five) minutes x 3 doses as needed for chest pain.) 25 tablet 3    REVIEW OF SYSTEMS:  [X]  denotes positive finding, [ ]  denotes negative finding Cardiac  Comments:  Chest pain or chest pressure:    Shortness of breath upon exertion:    Short of breath when lying flat:    Irregular heart rhythm:        Vascular    Pain in calf, thigh, or hip brought on by ambulation: x    Pain in feet at night that wakes you up from your sleep:     Blood clot in your veins:    Leg swelling:           PHYSICAL EXAM: Vitals:   12/30/20 1024 12/30/20 1054 12/30/20 1124 12/30/20 1125  BP: 133/87 (!) 141/68 126/66   Pulse: 78 79 72 72  Resp:      Temp:      TempSrc:      SpO2: 98% 95% 96% 96%  Weight:      Height:        GENERAL: The patient is a well-nourished male, in no acute distress. The vital signs are documented above. CARDIOVASCULAR: He does have palpable femoral pulses.  He has a 2+ dorsalis pedis pulse and 1+ right dorsalis pedis pulse PULMONARY: There is good air exchange  MUSCULOSKELETAL: There are no major deformities or cyanosis. NEUROLOGIC: No focal weakness or paresthesias are detected. SKIN: There are no ulcers or rashes noted. PSYCHIATRIC: The patient has a normal affect.  DATA:  Arteriogram from today was reviewed.  This shows essentially normal aortoiliac segments bilaterally.  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  The superficial artery has mild disease in the mid portion.  Popliteal artery is normal on the right and he does have diffuse tibial disease on the right.  On the left he has mild irregular plaque in his left groin.  MEDICAL ISSUES: I had long discussion with the patient and his wife present.  I explained that this is not a limb threatening situation.  I did explain that he should correct his claudication with right femoral endarterectomy and patch angioplasty.  Explained the magnitude of the procedure and expected excellent long-term durability.  He reports that he is very uncomfortable with his walking ability and wished to proceed with surgery.  We will schedule this at his convenience as an outpatient    Rosetta Posner, MD FACS [facility] [place] Office Tel [phone]  Note: Portions of this report may have been transcribed  using voice recognition software.  Every effort has been made to ensure accuracy; however, inadvertent computerized transcription errors may still be present.

## 2021-01-01 DIAGNOSIS — Z7984 Long term (current) use of oral hypoglycemic drugs: Secondary | ICD-10-CM | POA: Diagnosis not present

## 2021-01-01 DIAGNOSIS — E1159 Type 2 diabetes mellitus with other circulatory complications: Secondary | ICD-10-CM | POA: Diagnosis not present

## 2021-01-01 DIAGNOSIS — E785 Hyperlipidemia, unspecified: Secondary | ICD-10-CM | POA: Diagnosis not present

## 2021-01-01 DIAGNOSIS — I10 Essential (primary) hypertension: Secondary | ICD-10-CM | POA: Diagnosis not present

## 2021-01-01 DIAGNOSIS — E119 Type 2 diabetes mellitus without complications: Secondary | ICD-10-CM | POA: Diagnosis not present

## 2021-01-01 DIAGNOSIS — E1165 Type 2 diabetes mellitus with hyperglycemia: Secondary | ICD-10-CM | POA: Diagnosis not present

## 2021-01-08 NOTE — Progress Notes (Signed)
Surgical Instructions    Your procedure is scheduled on 01/13/21.  Report to [facility] Main Entrance "A" at 05:30 A.M., then check in with the Admitting office.  Call this number if you have problems the morning of surgery:  [phone]   If you have any questions prior to your surgery date call [phone]: Open Monday-Friday 8am-4pm    Remember:  Do not eat or drink after midnight the night before your surgery  You may drink clear liquids until 04:30am the morning of your surgery.   Clear liquids allowed are: Water, Non-Citrus Juices (without pulp), Carbonated Beverages, Clear Tea, Black Coffee Only, and Gatorade    Take these medicines the morning of surgery with A SIP OF WATER  amLODipine (NORVASC)  ezetimibe (ZETIA) isosorbide mononitrate (IMDUR)  metoprolol tartrate (LOPRESSOR) rosuvastatin (CRESTOR)    As of today, STOP taking any Aspirin (unless otherwise instructed by your surgeon) Aleve, Naproxen, Ibuprofen, Motrin, Advil, Goody's, BC's, all herbal medications, fish oil, and all vitamins. Please hold clopidogrel (PLAVIX) 5 days prior to surgery. Your last dose will be Thursday, 01/07/21.  WHAT DO I DO ABOUT MY DIABETES MEDICATION?   Marland Kitchen Do not take oral diabetes medicines (pills):  the morning of surgery.  . THE DAY BEFORE SURGERY, take 10 units of  Insulin NPH in the morning. Take 5 units of Insulin NPH the night before surgery. Take metFORMIN (GLUCOPHAGE) as prescribed.    . THE MORNING OF SURGERY, take 5 units Insulin NPH. Do not take metFORMIN (GLUCOPHAGE) the morning of surgery.   . The day of surgery, do not take other diabetes injectables, including Byetta (exenatide), Bydureon (exenatide ER), Victoza (liraglutide), or Trulicity (dulaglutide).   HOW TO MANAGE YOUR DIABETES BEFORE AND AFTER SURGERY  Why is it important to control my blood sugar before and after surgery? . Improving blood sugar levels before and after surgery helps healing and can limit  problems. . A way of improving blood sugar control is eating a healthy diet by: o  Eating less sugar and carbohydrates o  Increasing activity/exercise o  Talking with your doctor about reaching your blood sugar goals . High blood sugars (greater than 180 mg/dL) can raise your risk of infections and slow your recovery, so you will need to focus on controlling your diabetes during the weeks before surgery. . Make sure that the doctor who takes care of your diabetes knows about your planned surgery including the date and location.  How do I manage my blood sugar before surgery? . Check your blood sugar at least 4 times a day, starting 2 days before surgery, to make sure that the level is not too high or low. o Check your blood sugar the morning of your surgery when you wake up and every 2 hours until you get to the Short Stay unit. . If your blood sugar is less than 70 mg/dL, you will need to treat for low blood sugar: o Do not take insulin. o Treat a low blood sugar (less than 70 mg/dL) with  cup of clear juice (cranberry or apple), 4 glucose tablets, OR glucose gel. Recheck blood sugar in 15 minutes after treatment (to make sure it is greater than 70 mg/dL). If your blood sugar is not greater than 70 mg/dL on recheck, call [phone] o  for further instructions. . Report your blood sugar to the short stay nurse when you get to Short Stay.  . If you are admitted to the hospital after surgery: o Your blood  sugar will be checked by the staff and you will probably be given insulin after surgery (instead of oral diabetes medicines) to make sure you have good blood sugar levels. o The goal for blood sugar control after surgery is 80-180 mg/dL.                       Do not wear jewelry, make up, or nail polish            Do not wear lotions, powders, perfumes/colognes, or deodorant.            Men may shave face and neck.            Do not bring valuables to the hospital.            [facility] is not responsible for any belongings or valuables.  Do NOT Smoke (Tobacco/Vaping) or drink Alcohol 24 hours prior to your procedure If you use a CPAP at night, you may bring all equipment for your overnight stay.   Contacts, glasses, dentures or bridgework may not be worn into surgery, please bring cases for these belongings   For patients admitted to the hospital, discharge time will be determined by your treatment team.   Patients discharged the day of surgery will not be allowed to drive home, and someone needs to stay with them for 24 hours.    Special instructions:   [facility]- Preparing For Surgery  Before surgery, you can play an important role. Because skin is not sterile, your skin needs to be as free of germs as possible. You can reduce the number of germs on your skin by washing with CHG (chlorahexidine gluconate) Soap before surgery.  CHG is an antiseptic cleaner which kills germs and bonds with the skin to continue killing germs even after washing.    Oral Hygiene is also important to reduce your risk of infection.  Remember - BRUSH YOUR TEETH THE MORNING OF SURGERY WITH YOUR REGULAR TOOTHPASTE  Please do not use if you have an allergy to CHG or antibacterial soaps. If your skin becomes reddened/irritated stop using the CHG.  Do not shave (including legs and underarms) for at least 48 hours prior to first CHG shower. It is OK to shave your face.  Please follow these instructions carefully.   1. Shower the NIGHT BEFORE SURGERY and the MORNING OF SURGERY  2. If you chose to wash your hair, wash your hair first as usual with your normal shampoo.  3. After you shampoo, rinse your hair and body thoroughly to remove the shampoo.  4. Wash Face and genitals (private parts) with your normal soap.   5.  Shower the NIGHT BEFORE SURGERY and the MORNING OF SURGERY with CHG Soap.   6. Use CHG Soap as you would any other liquid soap. You can apply CHG directly to the skin  and wash gently with a scrungie or a clean washcloth.   7. Apply the CHG Soap to your body ONLY FROM THE NECK DOWN.  Do not use on open wounds or open sores. Avoid contact with your eyes, ears, mouth and genitals (private parts). Wash Face and genitals (private parts)  with your normal soap.   8. Wash thoroughly, paying special attention to the area where your surgery will be performed.  9. Thoroughly rinse your body with warm water from the neck down.  10. DO NOT shower/wash with your normal soap after using and rinsing off the  CHG Soap.  11. Pat yourself dry with a CLEAN TOWEL.  12. Wear CLEAN PAJAMAS to bed the night before surgery  13. Place CLEAN SHEETS on your bed the night before your surgery  14. DO NOT SLEEP WITH PETS.   Day of Surgery: Take a shower.  Wear Clean/Comfortable clothing the morning of surgery Do not apply any deodorants/lotions.   Remember to brush your teeth WITH YOUR REGULAR TOOTHPASTE.   Please read over the following fact sheets that you were given.

## 2021-01-11 ENCOUNTER — Encounter (HOSPITAL_COMMUNITY)
Admission: RE | Admit: 2021-01-11 | Discharge: 2021-01-11 | Disposition: A | Discharge status: Home / Self Care | Payer: Medicare Other | Source: Ambulatory Visit | Attending: Vascular Surgery | Admitting: Vascular Surgery

## 2021-01-11 ENCOUNTER — Other Ambulatory Visit: Payer: Self-pay

## 2021-01-11 ENCOUNTER — Telehealth: Payer: Self-pay

## 2021-01-11 ENCOUNTER — Encounter (HOSPITAL_COMMUNITY): Payer: Self-pay

## 2021-01-11 ENCOUNTER — Other Ambulatory Visit: Payer: Self-pay | Admitting: Cardiovascular Disease

## 2021-01-11 DIAGNOSIS — Z20822 Contact with and (suspected) exposure to covid-19: Secondary | ICD-10-CM | POA: Insufficient documentation

## 2021-01-11 DIAGNOSIS — I2510 Atherosclerotic heart disease of native coronary artery without angina pectoris: Secondary | ICD-10-CM | POA: Diagnosis not present

## 2021-01-11 DIAGNOSIS — Z7902 Long term (current) use of antithrombotics/antiplatelets: Secondary | ICD-10-CM | POA: Diagnosis not present

## 2021-01-11 DIAGNOSIS — N400 Enlarged prostate without lower urinary tract symptoms: Secondary | ICD-10-CM | POA: Diagnosis not present

## 2021-01-11 DIAGNOSIS — Z01812 Encounter for preprocedural laboratory examination: Secondary | ICD-10-CM | POA: Insufficient documentation

## 2021-01-11 DIAGNOSIS — Z79899 Other long term (current) drug therapy: Secondary | ICD-10-CM | POA: Diagnosis not present

## 2021-01-11 DIAGNOSIS — Z794 Long term (current) use of insulin: Secondary | ICD-10-CM | POA: Diagnosis not present

## 2021-01-11 DIAGNOSIS — I25118 Atherosclerotic heart disease of native coronary artery with other forms of angina pectoris: Secondary | ICD-10-CM | POA: Diagnosis not present

## 2021-01-11 DIAGNOSIS — Z7982 Long term (current) use of aspirin: Secondary | ICD-10-CM | POA: Diagnosis not present

## 2021-01-11 DIAGNOSIS — Z8249 Family history of ischemic heart disease and other diseases of the circulatory system: Secondary | ICD-10-CM | POA: Diagnosis not present

## 2021-01-11 DIAGNOSIS — Z888 Allergy status to other drugs, medicaments and biological substances status: Secondary | ICD-10-CM | POA: Diagnosis not present

## 2021-01-11 DIAGNOSIS — I10 Essential (primary) hypertension: Secondary | ICD-10-CM | POA: Diagnosis not present

## 2021-01-11 DIAGNOSIS — Z7984 Long term (current) use of oral hypoglycemic drugs: Secondary | ICD-10-CM | POA: Diagnosis not present

## 2021-01-11 DIAGNOSIS — I25810 Atherosclerosis of coronary artery bypass graft(s) without angina pectoris: Secondary | ICD-10-CM | POA: Diagnosis not present

## 2021-01-11 DIAGNOSIS — E1151 Type 2 diabetes mellitus with diabetic peripheral angiopathy without gangrene: Secondary | ICD-10-CM | POA: Diagnosis not present

## 2021-01-11 DIAGNOSIS — E1165 Type 2 diabetes mellitus with hyperglycemia: Secondary | ICD-10-CM | POA: Diagnosis not present

## 2021-01-11 DIAGNOSIS — I70211 Atherosclerosis of native arteries of extremities with intermittent claudication, right leg: Secondary | ICD-10-CM | POA: Diagnosis not present

## 2021-01-11 DIAGNOSIS — E7800 Pure hypercholesterolemia, unspecified: Secondary | ICD-10-CM | POA: Diagnosis not present

## 2021-01-11 HISTORY — DX: Personal history of urinary calculi: Z87.442

## 2021-01-11 HISTORY — DX: Fatty (change of) liver, not elsewhere classified: K76.0

## 2021-01-11 LAB — GLUCOSE, CAPILLARY: Glucose-Capillary: 130 mg/dL — ABNORMAL HIGH (ref 70–99)

## 2021-01-11 LAB — URINALYSIS, ROUTINE W REFLEX MICROSCOPIC
Bilirubin Urine: NEGATIVE
Glucose, UA: 150 mg/dL — AB
Hgb urine dipstick: NEGATIVE
Ketones, ur: NEGATIVE mg/dL
Leukocytes,Ua: NEGATIVE
Nitrite: NEGATIVE
Protein, ur: NEGATIVE mg/dL
Specific Gravity, Urine: 1.018 (ref 1.005–1.030)
pH: 5 (ref 5.0–8.0)

## 2021-01-11 LAB — COMPREHENSIVE METABOLIC PANEL
ALT: 24 U/L (ref 0–44)
AST: 27 U/L (ref 15–41)
Albumin: 4 g/dL (ref 3.5–5.0)
Alkaline Phosphatase: 52 U/L (ref 38–126)
Anion gap: 10 (ref 5–15)
BUN: 14 mg/dL (ref 8–23)
CO2: 23 mmol/L (ref 22–32)
Calcium: 9.7 mg/dL (ref 8.9–10.3)
Chloride: 105 mmol/L (ref 98–111)
Creatinine, Ser: 1.13 mg/dL (ref 0.61–1.24)
GFR, Estimated: >60 mL/min (ref >60)
Glucose, Bld: 140 mg/dL — ABNORMAL HIGH (ref 70–99)
Potassium: 3.7 mmol/L (ref 3.5–5.1)
Sodium: 138 mmol/L (ref 135–145)
Total Bilirubin: 0.7 mg/dL (ref 0.3–1.2)
Total Protein: 7.4 g/dL (ref 6.5–8.1)

## 2021-01-11 LAB — CBC
HCT: 43 % (ref 39.0–52.0)
Hemoglobin: 14.3 g/dL (ref 13.0–17.0)
MCH: 28.8 pg (ref 26.0–34.0)
MCHC: 33.3 g/dL (ref 30.0–36.0)
MCV: 86.5 fL (ref 80.0–100.0)
Platelets: 298 10*3/uL (ref 150–400)
RBC: 4.97 MIL/uL (ref 4.22–5.81)
RDW: 13.2 % (ref 11.5–15.5)
WBC: 7.4 10*3/uL (ref 4.0–10.5)
nRBC: 0 % (ref 0.0–0.2)

## 2021-01-11 LAB — SURGICAL PCR SCREEN
MRSA, PCR: NEGATIVE
Staphylococcus aureus: POSITIVE — AB

## 2021-01-11 LAB — TYPE AND SCREEN
ABO/RH(D): A POS
Antibody Screen: NEGATIVE

## 2021-01-11 LAB — SARS CORONAVIRUS 2 (TAT 6-24 HRS): SARS Coronavirus 2: NEGATIVE

## 2021-01-11 LAB — PROTIME-INR
INR: 1 (ref 0.8–1.2)
Prothrombin Time: 13 seconds (ref 11.4–15.2)

## 2021-01-11 LAB — APTT: aPTT: 30 seconds (ref 24–36)

## 2021-01-11 NOTE — Telephone Encounter (Signed)
Pt scheduled for a right femoral endarterectomy 01/13/21. Pt forgot to stop his Plavix as directed. He took a dose on 3/25-3/26, held yesterday and took dose today. Per Dr. Donnetta Hutching, ok to proceed with surgery reminding pt not to take anymore Plavix prior to surgery.   Patient and preadmission nurse Lilia Pro made aware of recommendations and verbalized understanding.

## 2021-01-11 NOTE — Progress Notes (Addendum)
PCP - Dr. Leighton Ruff (now retired), does not have a replacement at this time. Cardiologist - Dr. [name]  Chest x-ray - n/a EKG - 09/15/20 Stress Test - 07/30/12 ECHO - 01/23/20 Cardiac Cath -01/11/19   Sleep Study - denies CPAP - denies  Fasting Blood Sugar - 180's-190's Checks Blood Sugar 2 times a day CBG at PAT 130 A1C in CE- 7.9 on 01/01/21  Blood Thinner Instructions: Plavix was supposed to be held for 5 days prior to surgery. Pt states that he "forgot", took a dose this morning. Office called, unable to reach surgical scheduler, LVM.  Aspirin Instructions: continue per anticoagulation therapy protocol per [facility].  COVID TEST- 01/11/21; pt aware to quarantine after testing.    Anesthesia review: Yes, heart history.  Patient denies shortness of breath, fever, cough and chest pain at PAT appointment   All instructions explained to the patient, with a verbal understanding of the material. Patient agrees to go over the instructions while at home for a better understanding. Patient also instructed to self quarantine after being tested for COVID-19. The opportunity to ask questions was provided.   UPDATE: 55; spoke with Kia with [facility] with pt, Dr. Donnetta Hutching is aware and stated that procedure can proceed but it in imperative that he does not take anymore Plavix from now until after surgery. Pt verbalized understanding and is apologetic about the mistake.

## 2021-01-11 NOTE — Progress Notes (Signed)
Surgical Instructions    Your procedure is scheduled on 01/13/21.  Report to [facility] Main Entrance "A" at 05:30 A.M., then check in with the Admitting office.  Call this number if you have problems the morning of surgery:  [phone]   If you have any questions prior to your surgery date call [phone]: Open Monday-Friday 8am-4pm    Remember:  Do not eat or drink after midnight the night before your surgery    Take these medicines the morning of surgery with A SIP OF WATER  amLODipine (NORVASC)  ezetimibe (ZETIA) isosorbide mononitrate (IMDUR)  metoprolol tartrate (LOPRESSOR) rosuvastatin (CRESTOR)  Aspirin   As of today, STOP taking any Aleve, Naproxen, Ibuprofen, Motrin, Advil, Goody's, BC's, all herbal medications, fish oil, and all vitamins.  Please hold clopidogrel (PLAVIX) 5 days prior to surgery. Your last dose will be Thursday, 01/07/21.  WHAT DO I DO ABOUT MY DIABETES MEDICATION?   Marland Kitchen Do not take oral diabetes medicines (pills):  the morning of surgery.  . THE DAY BEFORE SURGERY, take 10 units of  Insulin NPH in the morning. Take 5 units of Insulin NPH the night before surgery. Take metFORMIN (GLUCOPHAGE) as prescribed.    . THE MORNING OF SURGERY, take 5 units Insulin NPH. Do not take metFORMIN (GLUCOPHAGE) the morning of surgery.   . The day of surgery, do not take other diabetes injectables, including Byetta (exenatide), Bydureon (exenatide ER), Victoza (liraglutide), or Trulicity (dulaglutide).   HOW TO MANAGE YOUR DIABETES BEFORE AND AFTER SURGERY  Why is it important to control my blood sugar before and after surgery? . Improving blood sugar levels before and after surgery helps healing and can limit problems. . A way of improving blood sugar control is eating a healthy diet by: o  Eating less sugar and carbohydrates o  Increasing activity/exercise o  Talking with your doctor about reaching your blood sugar goals . High blood sugars (greater than  180 mg/dL) can raise your risk of infections and slow your recovery, so you will need to focus on controlling your diabetes during the weeks before surgery. . Make sure that the doctor who takes care of your diabetes knows about your planned surgery including the date and location.  How do I manage my blood sugar before surgery? . Check your blood sugar at least 4 times a day, starting 2 days before surgery, to make sure that the level is not too high or low. o Check your blood sugar the morning of your surgery when you wake up and every 2 hours until you get to the Short Stay unit. . If your blood sugar is less than 70 mg/dL, you will need to treat for low blood sugar: o Do not take insulin. o Treat a low blood sugar (less than 70 mg/dL) with  cup of clear juice (cranberry or apple), 4 glucose tablets, OR glucose gel. Recheck blood sugar in 15 minutes after treatment (to make sure it is greater than 70 mg/dL). If your blood sugar is not greater than 70 mg/dL on recheck, call [phone] o  for further instructions. . Report your blood sugar to the short stay nurse when you get to Short Stay.  . If you are admitted to the hospital after surgery: o Your blood sugar will be checked by the staff and you will probably be given insulin after surgery (instead of oral diabetes medicines) to make sure you have good blood sugar levels. o The goal for blood sugar control after  surgery is 80-180 mg/dL.                       Do not wear jewelry.            Do not wear lotions, powders, colognes, or deodorant.            Men may shave face and neck.            Do not bring valuables to the hospital.            [facility] is not responsible for any belongings or valuables.  Do NOT Smoke (Tobacco/Vaping) or drink Alcohol 24 hours prior to your procedure If you use a CPAP at night, you may bring all equipment for your overnight stay.   Contacts, glasses, dentures or bridgework may not be worn into  surgery, please bring cases for these belongings   For patients admitted to the hospital, discharge time will be determined by your treatment team.   Patients discharged the day of surgery will not be allowed to drive home, and someone needs to stay with them for 24 hours.    Special instructions:   [place]- Preparing For Surgery  Before surgery, you can play an important role. Because skin is not sterile, your skin needs to be as free of germs as possible. You can reduce the number of germs on your skin by washing with CHG (chlorahexidine gluconate) Soap before surgery.  CHG is an antiseptic cleaner which kills germs and bonds with the skin to continue killing germs even after washing.    Oral Hygiene is also important to reduce your risk of infection.  Remember - BRUSH YOUR TEETH THE MORNING OF SURGERY WITH YOUR REGULAR TOOTHPASTE  Please do not use if you have an allergy to CHG or antibacterial soaps. If your skin becomes reddened/irritated stop using the CHG.  Do not shave (including legs and underarms) for at least 48 hours prior to first CHG shower. It is OK to shave your face.  Please follow these instructions carefully.   1. Shower the NIGHT BEFORE SURGERY and the MORNING OF SURGERY  2. If you chose to wash your hair, wash your hair first as usual with your normal shampoo.  3. After you shampoo, rinse your hair and body thoroughly to remove the shampoo.  4. Use CHG Soap as you would any other liquid soap. You can apply CHG directly to the skin and wash gently with a scrungie or a clean washcloth.   5. Apply the CHG Soap to your body ONLY FROM THE NECK DOWN.  Do not use on open wounds or open sores. Avoid contact with your eyes, ears, mouth and genitals (private parts). Wash Face and genitals (private parts)  with your normal soap.   6. Wash thoroughly, paying special attention to the area where your surgery will be performed.  7. Thoroughly rinse your body with warm water  from the neck down.  8. DO NOT shower/wash with your normal soap after using and rinsing off the CHG Soap.  9. Pat yourself dry with a CLEAN TOWEL.  10. Wear CLEAN PAJAMAS to bed the night before surgery  11. Place CLEAN SHEETS on your bed the night before your surgery  12. DO NOT SLEEP WITH PETS.   Day of Surgery: Shower with CHG soap. Wear Clean/Comfortable clothing the morning of surgery Do not apply any deodorants/lotions.   Remember to brush your teeth WITH YOUR REGULAR  TOOTHPASTE.   Please read over the following fact sheets that you were given.

## 2021-01-12 ENCOUNTER — Other Ambulatory Visit (HOSPITAL_COMMUNITY): Payer: Medicare Other

## 2021-01-12 NOTE — Progress Notes (Signed)
Anesthesia Chart Review:  Follows with cardiology for history of CAD s/p CABG 2009 (s/p NSTEMI soon after surgery in 2009 treated with PCI to distal SVG, also had DES to RPDA in 2020), moderate aortic stenosis (mean gradient 22 mmHg per echo 01/2020), HTN, HLD, PAD.  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  Recently underwent lower extremity angiogram by Dr. Fletcher Anon, based on results Dr. Donnetta Hutching was consulted for right common femoral artery endarterectomy.   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  Last A1c 7.9 on 01/01/21.  Preop labs reviewed, unremarkable.  EKG 09/15/2020: Sinus bradycardia.  Rate 56.  Carotid duplex 05/26/2020: Summary:  Right Carotid: Velocities in the right ICA are consistent with a 1-39% stenosis. Bifurcation velocity 202/34 cm/s.  Left Carotid: Velocities in the left ICA are consistent with a 1-39% stenosis.  Vertebrals: Bilateral vertebral arteries demonstrate antegrade flow.  Subclavians: Normal flow hemodynamics were seen in bilateral subclavian arteries.   TTE 01/23/2020: 1. Left ventricular ejection fraction, by estimation, is 55 to 60%. The  left ventricle has normal function. The left ventricle has no regional  wall motion abnormalities. Left ventricular diastolic parameters are  consistent with Grade I diastolic  dysfunction (impaired relaxation).  2. Right ventricular systolic function is normal. The right ventricular  size is normal. There is normal pulmonary artery systolic pressure. The  estimated right ventricular systolic pressure is 27.7 mmHg.  3. The mitral valve is normal in structure. No evidence of mitral valve  regurgitation. No evidence of mitral stenosis.  4. The aortic valve is tricuspid. Aortic valve regurgitation is not  visualized. Moderate aortic valve stenosis. Aortic valve area, by VTI  measures 1.19 cm. Aortic valve mean gradient measures 22.0 mmHg.  5. The inferior vena cava is normal in  size with greater than 50%  respiratory variability, suggesting right atrial pressure of 3 mmHg.   Cath and PCI 01/11/2019: 1.  Severe native vessel coronary artery disease with known total occlusion of the LAD, left circumflex, and RCA 2.  Status post aortocoronary bypass surgery with continued patency of the LIMA to LAD, continued patency of the saphenous vein graft to diagonal, continued patency of the saphenous vein graft and stented segment into OM 2, and continued patency of the saphenous vein graft to distal RCA with severe downstream stenosis involving the PDA origin 3.  Successful PCI of the right PDA using high-pressure balloon angioplasty followed by stenting with a 2.25 x 16 mm Synergy DES postdilated to high pressure with a 2.5 mm noncompliant balloon  Recommend: Same day discharge per protocol, continue aspirin and ticagrelor long-term   Wynonia Musty [facility]/Anesthesiology Phone [phone] 01/12/2021 10:28 AM

## 2021-01-12 NOTE — Anesthesia Preprocedure Evaluation (Addendum)
Anesthesia Evaluation  Patient identified by MRN, date of birth, ID band Patient awake    Reviewed: Allergy & Precautions, NPO status , Patient's Chart, lab work & pertinent test results, reviewed documented beta blocker date and time   Airway Mallampati: II  TM Distance: >3 FB Neck ROM: Full    Dental  (+) Teeth Intact, Dental Advisory Given   Pulmonary neg pulmonary ROS,    Pulmonary exam normal breath sounds clear to auscultation       Cardiovascular hypertension, Pt. on medications and Pt. on home beta blockers + CAD, + Cardiac Stents, + CABG and + Peripheral Vascular Disease  + Valvular Problems/Murmurs AS  Rhythm:Regular Rate:Normal + Systolic murmurs Echo 05/18/41: 1. Left ventricular ejection fraction, by estimation, is 55 to 60%. The  left ventricle has normal function. The left ventricle has no regional  wall motion abnormalities. Left ventricular diastolic parameters are  consistent with Grade I diastolic  dysfunction (impaired relaxation).  2. Right ventricular systolic function is normal. The right ventricular  size is normal. There is normal pulmonary artery systolic pressure. The  estimated right ventricular systolic pressure is 35.3 mmHg.  3. The mitral valve is normal in structure. No evidence of mitral valve  regurgitation. No evidence of mitral stenosis.  4. The aortic valve is tricuspid. Aortic valve regurgitation is not  visualized. Moderate aortic valve stenosis. Aortic valve area, by VTI  measures 1.19 cm. Aortic valve mean gradient measures 22.0 mmHg.  5. The inferior vena cava is normal in size with greater than 50%  respiratory variability, suggesting right atrial pressure of 3 mmHg.    Neuro/Psych negative neurological ROS  negative psych ROS   GI/Hepatic negative GI ROS, Neg liver ROS,   Endo/Other  diabetes, Type 2, Insulin Dependent, Oral Hypoglycemic Agents  Renal/GU negative Renal ROS      Musculoskeletal negative musculoskeletal ROS (+)   Abdominal   Peds  Hematology  (+) Blood dyscrasia (Plavix), ,   Anesthesia Other Findings   Reproductive/Obstetrics                          Anesthesia Physical Anesthesia Plan  ASA: III  Anesthesia Plan: General   Post-op Pain Management:    Induction: Intravenous  PONV Risk Score and Plan: 2 and Dexamethasone and Ondansetron  Airway Management Planned: Oral ETT  Additional Equipment:   Intra-op Plan:   Post-operative Plan: Extubation in OR  Informed Consent: I have reviewed the patients History and Physical, chart, labs and discussed the procedure including the risks, benefits and alternatives for the proposed anesthesia with the patient or authorized representative who has indicated his/her understanding and acceptance.     Dental advisory given  Plan Discussed with: CRNA  Anesthesia Plan Comments: (2nd PIV, possible arterial line (will assess after induction), induction with etomidate.  PAT note by Karoline Caldwell, PA-C: Follows with cardiology for history of CAD s/p CABG 2009 (s/p NSTEMI soon after surgery in 2009 treated with PCI to distal SVG, also had DES to RPDA in 2020), moderate aortic stenosis (mean gradient 22 mmHg per echo 01/2020), HTN, HLD, PAD.  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  Recently underwent lower extremity angiogram by Dr. Fletcher Anon, based on results Dr. Donnetta Hutching was consulted for right common femoral artery endarterectomy.   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  Last A1c 7.9 on 01/01/21.  Preop labs reviewed, unremarkable.  EKG 09/15/2020: Sinus bradycardia.  Rate 56.  Carotid duplex 05/26/2020: Summary:  Right Carotid: Velocities in the right ICA are consistent with a 1-39% stenosis. Bifurcation velocity 202/34 cm/s.  Left Carotid: Velocities in the left ICA are consistent with a 1-39% stenosis.  Vertebrals: Bilateral  vertebral arteries demonstrate antegrade flow.  Subclavians: Normal flow hemodynamics were seen in bilateral subclavian arteries.   TTE 01/23/2020: 1. Left ventricular ejection fraction, by estimation, is 55 to 60%. The  left ventricle has normal function. The left ventricle has no regional  wall motion abnormalities. Left ventricular diastolic parameters are  consistent with Grade I diastolic  dysfunction (impaired relaxation).  2. Right ventricular systolic function is normal. The right ventricular  size is normal. There is normal pulmonary artery systolic pressure. The  estimated right ventricular systolic pressure is 87.5 mmHg.  3. The mitral valve is normal in structure. No evidence of mitral valve  regurgitation. No evidence of mitral stenosis.  4. The aortic valve is tricuspid. Aortic valve regurgitation is not  visualized. Moderate aortic valve stenosis. Aortic valve area, by VTI  measures 1.19 cm. Aortic valve mean gradient measures 22.0 mmHg.  5. The inferior vena cava is normal in size with greater than 50%  respiratory variability, suggesting right atrial pressure of 3 mmHg.   Cath and PCI 01/11/2019: 1.  Severe native vessel coronary artery disease with known total occlusion of the LAD, left circumflex, and RCA 2.  Status post aortocoronary bypass surgery with continued patency of the LIMA to LAD, continued patency of the saphenous vein graft to diagonal, continued patency of the saphenous vein graft and stented segment into OM 2, and continued patency of the saphenous vein graft to distal RCA with severe downstream stenosis involving the PDA origin 3.  Successful PCI of the right PDA using high-pressure balloon angioplasty followed by stenting with a 2.25 x 16 mm Synergy DES postdilated to high pressure with a 2.5 mm noncompliant balloon  Recommend: Same day discharge per protocol, continue aspirin and ticagrelor long-term  )     Anesthesia Quick Evaluation

## 2021-01-13 ENCOUNTER — Inpatient Hospital Stay (HOSPITAL_COMMUNITY): Payer: Medicare Other | Admitting: Physician Assistant

## 2021-01-13 ENCOUNTER — Encounter (HOSPITAL_COMMUNITY): Payer: Self-pay | Admitting: Vascular Surgery

## 2021-01-13 ENCOUNTER — Other Ambulatory Visit: Payer: Self-pay

## 2021-01-13 ENCOUNTER — Inpatient Hospital Stay (HOSPITAL_COMMUNITY): Payer: Medicare Other | Admitting: Anesthesiology

## 2021-01-13 ENCOUNTER — Inpatient Hospital Stay (HOSPITAL_COMMUNITY)
Admission: RE | Admit: 2021-01-13 | Discharge: 2021-01-14 | DRG: 253 | Disposition: A | Discharge status: Home / Self Care | Payer: Medicare Other | Attending: Vascular Surgery | Admitting: Vascular Surgery

## 2021-01-13 ENCOUNTER — Encounter (HOSPITAL_COMMUNITY)
Admission: RE | Disposition: A | Discharge status: Home / Self Care | Payer: Self-pay | Source: Home / Self Care | Attending: Vascular Surgery

## 2021-01-13 DIAGNOSIS — I70211 Atherosclerosis of native arteries of extremities with intermittent claudication, right leg: Secondary | ICD-10-CM | POA: Diagnosis present

## 2021-01-13 DIAGNOSIS — I25118 Atherosclerotic heart disease of native coronary artery with other forms of angina pectoris: Secondary | ICD-10-CM | POA: Diagnosis not present

## 2021-01-13 DIAGNOSIS — Z7902 Long term (current) use of antithrombotics/antiplatelets: Secondary | ICD-10-CM | POA: Diagnosis not present

## 2021-01-13 DIAGNOSIS — Z20822 Contact with and (suspected) exposure to covid-19: Secondary | ICD-10-CM | POA: Diagnosis present

## 2021-01-13 DIAGNOSIS — I739 Peripheral vascular disease, unspecified: Secondary | ICD-10-CM | POA: Diagnosis present

## 2021-01-13 DIAGNOSIS — E1151 Type 2 diabetes mellitus with diabetic peripheral angiopathy without gangrene: Secondary | ICD-10-CM | POA: Diagnosis present

## 2021-01-13 DIAGNOSIS — Z8249 Family history of ischemic heart disease and other diseases of the circulatory system: Secondary | ICD-10-CM | POA: Diagnosis not present

## 2021-01-13 DIAGNOSIS — I10 Essential (primary) hypertension: Secondary | ICD-10-CM | POA: Diagnosis present

## 2021-01-13 DIAGNOSIS — E7800 Pure hypercholesterolemia, unspecified: Secondary | ICD-10-CM | POA: Diagnosis present

## 2021-01-13 DIAGNOSIS — Z794 Long term (current) use of insulin: Secondary | ICD-10-CM | POA: Diagnosis not present

## 2021-01-13 DIAGNOSIS — Z7984 Long term (current) use of oral hypoglycemic drugs: Secondary | ICD-10-CM | POA: Diagnosis not present

## 2021-01-13 DIAGNOSIS — Z888 Allergy status to other drugs, medicaments and biological substances status: Secondary | ICD-10-CM

## 2021-01-13 DIAGNOSIS — I25810 Atherosclerosis of coronary artery bypass graft(s) without angina pectoris: Secondary | ICD-10-CM | POA: Diagnosis present

## 2021-01-13 DIAGNOSIS — Z7982 Long term (current) use of aspirin: Secondary | ICD-10-CM | POA: Diagnosis not present

## 2021-01-13 DIAGNOSIS — N400 Enlarged prostate without lower urinary tract symptoms: Secondary | ICD-10-CM | POA: Diagnosis not present

## 2021-01-13 DIAGNOSIS — I2510 Atherosclerotic heart disease of native coronary artery without angina pectoris: Secondary | ICD-10-CM | POA: Diagnosis not present

## 2021-01-13 DIAGNOSIS — Z79899 Other long term (current) drug therapy: Secondary | ICD-10-CM | POA: Diagnosis not present

## 2021-01-13 DIAGNOSIS — E1165 Type 2 diabetes mellitus with hyperglycemia: Secondary | ICD-10-CM | POA: Diagnosis not present

## 2021-01-13 HISTORY — PX: ENDARTERECTOMY FEMORAL: SHX5804

## 2021-01-13 LAB — GLUCOSE, CAPILLARY
Glucose-Capillary: 206 mg/dL — ABNORMAL HIGH (ref 70–99)
Glucose-Capillary: 217 mg/dL — ABNORMAL HIGH (ref 70–99)
Glucose-Capillary: 231 mg/dL — ABNORMAL HIGH (ref 70–99)
Glucose-Capillary: 392 mg/dL — ABNORMAL HIGH (ref 70–99)
Glucose-Capillary: 322 mg/dL — ABNORMAL HIGH (ref 70–99)
Glucose-Capillary: 360 mg/dL — ABNORMAL HIGH (ref 70–99)

## 2021-01-13 SURGERY — ENDARTERECTOMY, FEMORAL
Anesthesia: General | Site: Groin | Laterality: Right

## 2021-01-13 MED ORDER — BISACODYL 10 MG RE SUPP: 10.0000 mg | Freq: Every day | RECTAL | Status: DC | PRN

## 2021-01-13 MED ORDER — EZETIMIBE 10 MG PO TABS
10.0000 mg | ORAL_TABLET | Freq: Every day | ORAL | Status: DC
  Administered 2021-01-14: 10 mg via ORAL
  Filled 2021-01-13: qty 1

## 2021-01-13 MED ORDER — LACTATED RINGERS IV SOLN: INTRAVENOUS | Status: DC | PRN

## 2021-01-13 MED ORDER — ADULT MULTIVITAMIN W/MINERALS CH
1.0000 | ORAL_TABLET | Freq: Every day | ORAL | Status: DC
  Administered 2021-01-14: 1 via ORAL
  Filled 2021-01-13: qty 1

## 2021-01-13 MED ORDER — ONDANSETRON HCL 4 MG/2ML IJ SOLN
INTRAMUSCULAR | Status: DC | PRN
  Administered 2021-01-13: 4 mg via INTRAVENOUS

## 2021-01-13 MED ORDER — OXYCODONE-ACETAMINOPHEN 5-325 MG PO TABS: 1.0000 | ORAL_TABLET | ORAL | Status: DC | PRN

## 2021-01-13 MED ORDER — ACETAMINOPHEN 500 MG PO TABS
1000.0000 mg | ORAL_TABLET | Freq: Once | ORAL | Status: AC
  Administered 2021-01-13: 1000 mg via ORAL
  Filled 2021-01-13: qty 2

## 2021-01-13 MED ORDER — PANTOPRAZOLE SODIUM 40 MG PO TBEC
40.0000 mg | DELAYED_RELEASE_TABLET | Freq: Every day | ORAL | Status: DC
  Administered 2021-01-13 – 2021-01-14 (×2): 40 mg via ORAL
  Filled 2021-01-13 (×2): qty 1

## 2021-01-13 MED ORDER — ROCURONIUM BROMIDE 10 MG/ML (PF) SYRINGE
PREFILLED_SYRINGE | INTRAVENOUS | Status: AC
  Filled 2021-01-13: qty 10

## 2021-01-13 MED ORDER — SODIUM CHLORIDE 0.9 % IV SOLN: 500.0000 mL | Freq: Once | INTRAVENOUS | Status: DC | PRN

## 2021-01-13 MED ORDER — HYDRALAZINE HCL 20 MG/ML IJ SOLN: 5.0000 mg | INTRAMUSCULAR | Status: DC | PRN

## 2021-01-13 MED ORDER — PHENOL 1.4 % MT LIQD: 1.0000 | OROMUCOSAL | Status: DC | PRN

## 2021-01-13 MED ORDER — HYDROCHLOROTHIAZIDE 25 MG PO TABS
25.0000 mg | ORAL_TABLET | Freq: Every day | ORAL | Status: DC
  Administered 2021-01-13 – 2021-01-14 (×2): 25 mg via ORAL
  Filled 2021-01-13 (×2): qty 1

## 2021-01-13 MED ORDER — ONDANSETRON HCL 4 MG/2ML IJ SOLN: 4.0000 mg | Freq: Once | INTRAMUSCULAR | Status: DC | PRN

## 2021-01-13 MED ORDER — CHLORHEXIDINE GLUCONATE CLOTH 2 % EX PADS: 6.0000 | MEDICATED_PAD | Freq: Once | CUTANEOUS | Status: DC

## 2021-01-13 MED ORDER — PHENYLEPHRINE 40 MCG/ML (10ML) SYRINGE FOR IV PUSH (FOR BLOOD PRESSURE SUPPORT)
PREFILLED_SYRINGE | INTRAVENOUS | Status: AC
  Filled 2021-01-13: qty 10

## 2021-01-13 MED ORDER — ASPIRIN EC 81 MG PO TBEC
81.0000 mg | DELAYED_RELEASE_TABLET | Freq: Every day | ORAL | Status: DC
  Administered 2021-01-14: 81 mg via ORAL
  Filled 2021-01-13: qty 1

## 2021-01-13 MED ORDER — ISOSORBIDE MONONITRATE ER 60 MG PO TB24
60.0000 mg | ORAL_TABLET | Freq: Every day | ORAL | Status: DC
  Administered 2021-01-14: 60 mg via ORAL
  Filled 2021-01-13 (×2): qty 1

## 2021-01-13 MED ORDER — PROTAMINE SULFATE 10 MG/ML IV SOLN
INTRAVENOUS | Status: AC
  Filled 2021-01-13: qty 5

## 2021-01-13 MED ORDER — ALUM & MAG HYDROXIDE-SIMETH 200-200-20 MG/5ML PO SUSP: 15.0000 mL | ORAL | Status: DC | PRN

## 2021-01-13 MED ORDER — LIDOCAINE 2% (20 MG/ML) 5 ML SYRINGE
INTRAMUSCULAR | Status: AC
  Filled 2021-01-13: qty 5

## 2021-01-13 MED ORDER — CLOPIDOGREL BISULFATE 75 MG PO TABS
75.0000 mg | ORAL_TABLET | Freq: Every day | ORAL | Status: DC
  Administered 2021-01-14: 75 mg via ORAL
  Filled 2021-01-13: qty 1

## 2021-01-13 MED ORDER — HEPARIN SODIUM (PORCINE) 1000 UNIT/ML IJ SOLN
INTRAMUSCULAR | Status: AC
  Filled 2021-01-13: qty 1

## 2021-01-13 MED ORDER — CEFAZOLIN SODIUM-DEXTROSE 2-4 GM/100ML-% IV SOLN
2.0000 g | INTRAVENOUS | Status: AC
  Administered 2021-01-13: 2 g via INTRAVENOUS
  Filled 2021-01-13: qty 100

## 2021-01-13 MED ORDER — HEPARIN SODIUM (PORCINE) 5000 UNIT/ML IJ SOLN
5000.0000 [IU] | Freq: Three times a day (TID) | INTRAMUSCULAR | Status: DC
  Administered 2021-01-14: 5000 [IU] via SUBCUTANEOUS
  Filled 2021-01-13: qty 1

## 2021-01-13 MED ORDER — ROCURONIUM BROMIDE 10 MG/ML (PF) SYRINGE
PREFILLED_SYRINGE | INTRAVENOUS | Status: DC | PRN
  Administered 2021-01-13: 60 mg via INTRAVENOUS
  Administered 2021-01-13: 40 mg via INTRAVENOUS

## 2021-01-13 MED ORDER — DEXMEDETOMIDINE (PRECEDEX) IN NS 20 MCG/5ML (4 MCG/ML) IV SYRINGE
PREFILLED_SYRINGE | INTRAVENOUS | Status: DC | PRN
  Administered 2021-01-13: 6 ug via INTRAVENOUS

## 2021-01-13 MED ORDER — ETOMIDATE 2 MG/ML IV SOLN
INTRAVENOUS | Status: DC | PRN
  Administered 2021-01-13: 18 mg via INTRAVENOUS

## 2021-01-13 MED ORDER — ALBUMIN HUMAN 5 % IV SOLN: INTRAVENOUS | Status: DC | PRN

## 2021-01-13 MED ORDER — MAGNESIUM SULFATE 2 GM/50ML IV SOLN: 2.0000 g | Freq: Every day | INTRAVENOUS | Status: DC | PRN

## 2021-01-13 MED ORDER — METOPROLOL TARTRATE 5 MG/5ML IV SOLN: 2.0000 mg | INTRAVENOUS | Status: DC | PRN

## 2021-01-13 MED ORDER — DEXMEDETOMIDINE (PRECEDEX) IN NS 20 MCG/5ML (4 MCG/ML) IV SYRINGE
PREFILLED_SYRINGE | INTRAVENOUS | Status: AC
  Filled 2021-01-13: qty 5

## 2021-01-13 MED ORDER — 0.9 % SODIUM CHLORIDE (POUR BTL) OPTIME
TOPICAL | Status: DC | PRN
  Administered 2021-01-13: 2000 mL

## 2021-01-13 MED ORDER — CEFAZOLIN SODIUM-DEXTROSE 2-4 GM/100ML-% IV SOLN
2.0000 g | Freq: Three times a day (TID) | INTRAVENOUS | Status: AC
  Administered 2021-01-13 (×2): 2 g via INTRAVENOUS
  Filled 2021-01-13 (×2): qty 100

## 2021-01-13 MED ORDER — SUGAMMADEX SODIUM 200 MG/2ML IV SOLN
INTRAVENOUS | Status: DC | PRN
  Administered 2021-01-13: 200 mg via INTRAVENOUS

## 2021-01-13 MED ORDER — VITAMIN B-12 1000 MCG PO TABS
500.0000 ug | ORAL_TABLET | Freq: Every day | ORAL | Status: DC
  Filled 2021-01-13: qty 1

## 2021-01-13 MED ORDER — EPHEDRINE SULFATE-NACL 50-0.9 MG/10ML-% IV SOSY
PREFILLED_SYRINGE | INTRAVENOUS | Status: DC | PRN
  Administered 2021-01-13: 10 mg via INTRAVENOUS
  Administered 2021-01-13: 5 mg via INTRAVENOUS

## 2021-01-13 MED ORDER — ROSUVASTATIN CALCIUM 20 MG PO TABS
20.0000 mg | ORAL_TABLET | Freq: Every day | ORAL | Status: DC
  Administered 2021-01-14: 20 mg via ORAL
  Filled 2021-01-13: qty 1

## 2021-01-13 MED ORDER — ONDANSETRON HCL 4 MG/2ML IJ SOLN
INTRAMUSCULAR | Status: AC
  Filled 2021-01-13: qty 2

## 2021-01-13 MED ORDER — DEXAMETHASONE SODIUM PHOSPHATE 10 MG/ML IJ SOLN
INTRAMUSCULAR | Status: DC | PRN
  Administered 2021-01-13: 10 mg via INTRAVENOUS

## 2021-01-13 MED ORDER — PHENYLEPHRINE 40 MCG/ML (10ML) SYRINGE FOR IV PUSH (FOR BLOOD PRESSURE SUPPORT)
PREFILLED_SYRINGE | INTRAVENOUS | Status: DC | PRN
  Administered 2021-01-13 (×2): 40 ug via INTRAVENOUS

## 2021-01-13 MED ORDER — LABETALOL HCL 5 MG/ML IV SOLN: 10.0000 mg | INTRAVENOUS | Status: DC | PRN

## 2021-01-13 MED ORDER — INSULIN ASPART 100 UNIT/ML ~~LOC~~ SOLN
15.0000 [IU] | Freq: Once | SUBCUTANEOUS | Status: AC
  Administered 2021-01-13: 15 [IU] via SUBCUTANEOUS

## 2021-01-13 MED ORDER — METFORMIN HCL 500 MG PO TABS
1000.0000 mg | ORAL_TABLET | Freq: Two times a day (BID) | ORAL | Status: DC
  Administered 2021-01-13 – 2021-01-14 (×2): 1000 mg via ORAL
  Filled 2021-01-13 (×2): qty 2

## 2021-01-13 MED ORDER — CHLORHEXIDINE GLUCONATE 0.12 % MT SOLN
OROMUCOSAL | Status: AC
  Administered 2021-01-13: 15 mL
  Filled 2021-01-13: qty 15

## 2021-01-13 MED ORDER — AMLODIPINE BESYLATE 10 MG PO TABS
10.0000 mg | ORAL_TABLET | Freq: Every day | ORAL | Status: DC
  Administered 2021-01-14: 10 mg via ORAL
  Filled 2021-01-13: qty 1

## 2021-01-13 MED ORDER — FENTANYL CITRATE (PF) 100 MCG/2ML IJ SOLN
INTRAMUSCULAR | Status: DC | PRN
  Administered 2021-01-13: 25 ug via INTRAVENOUS
  Administered 2021-01-13: 50 ug via INTRAVENOUS
  Administered 2021-01-13: 100 ug via INTRAVENOUS

## 2021-01-13 MED ORDER — ONDANSETRON HCL 4 MG/2ML IJ SOLN: 4.0000 mg | Freq: Four times a day (QID) | INTRAMUSCULAR | Status: DC | PRN

## 2021-01-13 MED ORDER — ACETAMINOPHEN 650 MG RE SUPP: 325.0000 mg | RECTAL | Status: DC | PRN

## 2021-01-13 MED ORDER — SODIUM CHLORIDE 0.9 % IV SOLN: INTRAVENOUS | Status: DC

## 2021-01-13 MED ORDER — SODIUM CHLORIDE 0.9 % IV SOLN
INTRAVENOUS | Status: AC
  Filled 2021-01-13: qty 1.2

## 2021-01-13 MED ORDER — INSULIN ASPART 100 UNIT/ML ~~LOC~~ SOLN
0.0000 [IU] | Freq: Three times a day (TID) | SUBCUTANEOUS | Status: DC
  Administered 2021-01-13: 15 [IU] via SUBCUTANEOUS
  Administered 2021-01-14: 3 [IU] via SUBCUTANEOUS

## 2021-01-13 MED ORDER — ACETAMINOPHEN 325 MG PO TABS
325.0000 mg | ORAL_TABLET | ORAL | Status: DC | PRN
Start: 2021-01-13 — End: 2021-01-14

## 2021-01-13 MED ORDER — MORPHINE SULFATE (PF) 2 MG/ML IV SOLN: 2.0000 mg | INTRAVENOUS | Status: DC | PRN

## 2021-01-13 MED ORDER — SODIUM CHLORIDE 0.9 % IV SOLN
INTRAVENOUS | Status: DC | PRN
  Administered 2021-01-13: 500 mL

## 2021-01-13 MED ORDER — FENTANYL CITRATE (PF) 250 MCG/5ML IJ SOLN
INTRAMUSCULAR | Status: AC
  Filled 2021-01-13: qty 5

## 2021-01-13 MED ORDER — ZINC 50 MG PO TABS: 50.0000 mg | ORAL_TABLET | ORAL | Status: DC

## 2021-01-13 MED ORDER — PROPOFOL 10 MG/ML IV BOLUS
INTRAVENOUS | Status: AC
  Filled 2021-01-13: qty 20

## 2021-01-13 MED ORDER — FENTANYL CITRATE (PF) 100 MCG/2ML IJ SOLN
25.0000 ug | INTRAMUSCULAR | Status: DC | PRN
  Administered 2021-01-13: 25 ug via INTRAVENOUS

## 2021-01-13 MED ORDER — PROTAMINE SULFATE 10 MG/ML IV SOLN
INTRAVENOUS | Status: DC | PRN
  Administered 2021-01-13: 15 mg via INTRAVENOUS
  Administered 2021-01-13 (×2): 10 mg via INTRAVENOUS
  Administered 2021-01-13: 15 mg via INTRAVENOUS

## 2021-01-13 MED ORDER — LIDOCAINE 2% (20 MG/ML) 5 ML SYRINGE
INTRAMUSCULAR | Status: DC | PRN
  Administered 2021-01-13: 100 mg via INTRAVENOUS

## 2021-01-13 MED ORDER — NITROGLYCERIN 0.4 MG SL SUBL: 0.4000 mg | SUBLINGUAL_TABLET | SUBLINGUAL | Status: DC | PRN

## 2021-01-13 MED ORDER — GUAIFENESIN-DM 100-10 MG/5ML PO SYRP: 15.0000 mL | ORAL_SOLUTION | ORAL | Status: DC | PRN

## 2021-01-13 MED ORDER — HEPARIN SODIUM (PORCINE) 1000 UNIT/ML IJ SOLN
INTRAMUSCULAR | Status: DC | PRN
  Administered 2021-01-13: 9000 [IU] via INTRAVENOUS

## 2021-01-13 MED ORDER — METOPROLOL TARTRATE 50 MG PO TABS
75.0000 mg | ORAL_TABLET | Freq: Two times a day (BID) | ORAL | Status: DC
  Administered 2021-01-13 – 2021-01-14 (×2): 75 mg via ORAL
  Filled 2021-01-13 (×2): qty 1

## 2021-01-13 MED ORDER — BENAZEPRIL HCL 40 MG PO TABS
40.0000 mg | ORAL_TABLET | Freq: Every day | ORAL | Status: DC
  Administered 2021-01-13 – 2021-01-14 (×2): 40 mg via ORAL
  Filled 2021-01-13: qty 1
  Filled 2021-01-13: qty 8
  Filled 2021-01-13: qty 1

## 2021-01-13 MED ORDER — POLYETHYLENE GLYCOL 3350 17 G PO PACK: 17.0000 g | PACK | Freq: Every day | ORAL | Status: DC | PRN

## 2021-01-13 MED ORDER — DEXAMETHASONE SODIUM PHOSPHATE 10 MG/ML IJ SOLN
INTRAMUSCULAR | Status: AC
  Filled 2021-01-13: qty 1

## 2021-01-13 MED ORDER — POTASSIUM CHLORIDE CRYS ER 20 MEQ PO TBCR: 20.0000 meq | EXTENDED_RELEASE_TABLET | Freq: Every day | ORAL | Status: DC | PRN

## 2021-01-13 MED ORDER — FENTANYL CITRATE (PF) 100 MCG/2ML IJ SOLN
INTRAMUSCULAR | Status: AC
  Filled 2021-01-13: qty 2

## 2021-01-13 MED ORDER — DOCUSATE SODIUM 100 MG PO CAPS
100.0000 mg | ORAL_CAPSULE | Freq: Every day | ORAL | Status: DC
  Filled 2021-01-13: qty 1

## 2021-01-13 SURGICAL SUPPLY — 33 items
CANISTER SUCT 3000ML PPV (MISCELLANEOUS) ×3 IMPLANT
CANNULA VESSEL 3MM 2 BLNT TIP (CANNULA) ×6 IMPLANT
CLIP LIGATING EXTRA MED SLVR (CLIP) ×3 IMPLANT
CLIP LIGATING EXTRA SM BLUE (MISCELLANEOUS) ×3 IMPLANT
COVER WAND RF STERILE (DRAPES) IMPLANT
DERMABOND ADVANCED (GAUZE/BANDAGES/DRESSINGS) ×2
DERMABOND ADVANCED .7 DNX12 (GAUZE/BANDAGES/DRESSINGS) ×1 IMPLANT
DRAIN SNY 10X20 3/4 PERF (WOUND CARE) IMPLANT
ELECT REM PT RETURN 9FT ADLT (ELECTROSURGICAL) ×3
ELECTRODE REM PT RTRN 9FT ADLT (ELECTROSURGICAL) ×1 IMPLANT
EVACUATOR SILICONE 100CC (DRAIN) IMPLANT
GLOVE SS BIOGEL STRL SZ 7.5 (GLOVE) ×1 IMPLANT
GLOVE SUPERSENSE BIOGEL SZ 7.5 (GLOVE) ×2
GLOVE SURG UNDER POLY LF SZ6.5 (GLOVE) ×24 IMPLANT
GOWN STRL REUS W/ TWL LRG LVL3 (GOWN DISPOSABLE) ×3 IMPLANT
GOWN STRL REUS W/TWL LRG LVL3 (GOWN DISPOSABLE) ×6
KIT BASIN OR (CUSTOM PROCEDURE TRAY) ×3 IMPLANT
KIT TURNOVER KIT B (KITS) ×3 IMPLANT
NS IRRIG 1000ML POUR BTL (IV SOLUTION) ×6 IMPLANT
PACK PERIPHERAL VASCULAR (CUSTOM PROCEDURE TRAY) ×3 IMPLANT
PAD ARMBOARD 7.5X6 YLW CONV (MISCELLANEOUS) ×6 IMPLANT
PATCH HEMASHIELD 8X75 (Vascular Products) ×3 IMPLANT
SUT ETHILON 3 0 PS 1 (SUTURE) IMPLANT
SUT PROLENE 5 0 C 1 24 (SUTURE) ×3 IMPLANT
SUT PROLENE 6 0 CC (SUTURE) ×12 IMPLANT
SUT VIC AB 2-0 CTX 36 (SUTURE) ×3 IMPLANT
SUT VIC AB 3-0 SH 27 (SUTURE) ×3
SUT VIC AB 3-0 SH 27X BRD (SUTURE) ×1 IMPLANT
TAPE STRIPS DRAPE STRL (GAUZE/BANDAGES/DRESSINGS) ×3 IMPLANT
TOWEL GREEN STERILE (TOWEL DISPOSABLE) ×3 IMPLANT
TRAY FOLEY MTR SLVR 16FR STAT (SET/KITS/TRAYS/PACK) IMPLANT
UNDERPAD 30X36 HEAVY ABSORB (UNDERPADS AND DIAPERS) ×3 IMPLANT
WATER STERILE IRR 1000ML POUR (IV SOLUTION) ×3 IMPLANT

## 2021-01-13 NOTE — Anesthesia Procedure Notes (Signed)
Procedure Name: Intubation Date/Time: 01/13/2021 7:31 AM Performed by: Georgia Duff, CRNA Pre-anesthesia Checklist: Patient identified, Emergency Drugs available, Suction available and Patient being monitored Patient Re-evaluated:Patient Re-evaluated prior to induction Oxygen Delivery Method: Circle System Utilized Preoxygenation: Pre-oxygenation with 100% oxygen Induction Type: IV induction Ventilation: Mask ventilation without difficulty Laryngoscope Size: Miller and 2 Grade View: Grade I Tube type: Oral Tube size: 7.5 mm Number of attempts: 1 Airway Equipment and Method: Stylet and Oral airway Placement Confirmation: ETT inserted through vocal cords under direct vision,  positive ETCO2 and breath sounds checked- equal and bilateral Secured at: 23 cm Tube secured with: Tape Dental Injury: Teeth and Oropharynx as per pre-operative assessment

## 2021-01-13 NOTE — Progress Notes (Signed)
Patient ID: [name] [name], male   DOB: 25-Oct-1945, 75 y.o.   MRN: [id number] Comfortable.  Awake and alert. Right groin without hematoma.  2+ dorsalis pedis pulse. Stable postop.  Probable discharge in a.m. Seen in follow-up in [place] office in approximately 3 weeks

## 2021-01-13 NOTE — Op Note (Signed)
    OPERATIVE REPORT  DATE OF SURGERY: 01/13/2021  PATIENT: [name] [name], 75 y.o. male MRN: [id number]  DOB: July 08, 1946  PRE-OPERATIVE DIAGNOSIS: Limiting claudication right lower extremity  POST-OPERATIVE DIAGNOSIS:  Same  PROCEDURE: Right femoral endarterectomy and Dacron patch angioplasty  SURGEON:  Curt Jews, M.D.  PHYSICIAN ASSISTANT: Rhyne PAC  The assistant was needed for exposure and to expedite the case  ANESTHESIA: General  EBL: per anesthesia record  Total I/O In: 800 [I.V.:800] Out: -   BLOOD ADMINISTERED: none  DRAINS: none  SPECIMEN: none  COUNTS CORRECT:  YES  PATIENT DISPOSITION:  PACU - hemodynamically stable  PROCEDURE DETAILS: Patient was taken operating placed supine position where the area of the right groin prepped draped in sterile fashion.  A oblique incision was made over the common femoral artery and carried down to isolate the common superficial femoral and profunda femoris arteries.  The external iliac artery was exposed above the inguinal ligament.  Tributary branches were controlled with 2-0 silk Potts ties.  The superficial femoral artery was patent with minimal disease and was encircled with a blue vessel loop.  Patient had 2 large profundus femoris branches and these were both controlled as well.  The patient was given 9000 units of intravenous heparin after adequate circulation time the external iliac was occluded with a Henley clamp the supra femoral artery was occluded with a blue vessel loop in the fundus femoris branches were occluded with profundus clamps.  The common femoral artery was opened and extended longitudinally with Potts scissors onto the common carotid above the plaque.  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  Patient underwent endarterectomy beginning on the external iliac and extending down to the bifurcation of the common femoral artery.  Remaining atheromatous debris was removed.  The plaque  was tacked at the origin of the profunda and the superficial femoral artery with interrupted 6-0 Prolene sutures.  A Finesse Hemashield Dacron patch was brought onto the field and a Finesse Dacron patch was sewn as a patch angioplasty with a running 6-0 Prolene suture.  Prior to completion of the closure the usual flushing maneuvers were undertaken.  Anastomosis was completed and flow was restored.  Patient was 50 mg of protamine to reverse the heparin.  Wounds irrigated with saline.  Hemostasis cautery.  The wounds were closed with 2-0 Vicryl in several layers and subcutaneous tissue.  Skin was closed with 3-0 subcuticular Vicryl suture.  Sterile dressing was applied.  The patient had a 2-3+ dorsalis pedis pulse.   Rosetta Posner, M.D., [facility] 01/13/2021 9:47 AM  Note: Portions of this report may have been transcribed using voice recognition software.  Every effort has been made to ensure accuracy; however, inadvertent computerized transcription errors may still be present.

## 2021-01-13 NOTE — Transfer of Care (Signed)
Immediate Anesthesia Transfer of Care Note  Patient: [name] [name]  Procedure(s) Performed: RIGHT FEMORAL ENDARTERECTOMY  WITH PATCH ANGIOPLASTY (Right Groin)  Patient Location: PACU  Anesthesia Type:General  Level of Consciousness: drowsy and patient cooperative  Airway & Oxygen Therapy: Patient Spontanous Breathing  Post-op Assessment: Report given to RN and Post -op Vital signs reviewed and stable  Post vital signs: Reviewed and stable  Last Vitals:  Vitals Value Taken Time  BP    Temp    Pulse    Resp    SpO2      Last Pain:  Vitals:   01/13/21 0637  TempSrc: Oral  PainSc:          Complications: No complications documented.

## 2021-01-13 NOTE — Discharge Instructions (Signed)
 [facility]  Discharge instructions  Lower Extremity Bypass Surgery  Please refer to the following instruction for your post-procedure care. Your surgeon or physician assistant will discuss any changes with you.  Activity  You are encouraged to walk as much as you can. You can slowly return to normal activities during the month after your surgery. Avoid strenuous activity and heavy lifting until your doctor tells you it's OK. Avoid activities such as vacuuming or swinging a golf club. Do not drive until your doctor give the OK and you are no longer taking prescription pain medications. It is also normal to have difficulty with sleep habits, eating and bowel movement after surgery. These will go away with time.  Bathing/Showering  Shower daily after you go home. Do not soak in a bathtub, hot tub, or swim until the incision heals completely.  Incision Care  Clean your incision with mild soap and water. Shower every day. Pat the area dry with a clean towel. You do not need a bandage unless otherwise instructed. Do not apply any ointments or creams to your incision. If you have open wounds you will be instructed how to care for them or a visiting nurse may be arranged for you. If you have staples or sutures along your incision they will be removed at your post-op appointment. You may have skin glue on your incision. Do not peel it off. It will come off on its own in about one week.  Wash the groin wound with soap and water daily and pat dry. (No tub bath-only shower)  Then put a dry gauze or washcloth in the groin to keep this area dry to help prevent wound infection.  Do this daily and as needed.  Do not use Vaseline or neosporin on your incisions.  Only use soap and water on your incisions and then protect and keep dry.  Diet  Resume your normal diet. There are no special food restrictions following this procedure. A low fat/ low cholesterol diet is  recommended for all patients with vascular disease. In order to heal from your surgery, it is CRITICAL to get adequate nutrition. Your body requires vitamins, minerals, and protein. Vegetables are the best source of vitamins and minerals. Vegetables also provide the perfect balance of protein. Processed food has little nutritional value, so try to avoid this.  Medications  Resume taking all your medications unless your doctor or physician assistant tells you not to. If your incision is causing pain, you may take over-the-counter pain relievers such as acetaminophen (Tylenol). If you were prescribed a stronger pain medication, please aware these medication can cause nausea and constipation. Prevent nausea by taking the medication with a snack or meal. Avoid constipation by drinking plenty of fluids and eating foods with high amount of fiber, such as fruits, vegetables, and grains. Take Colace 100 mg (an over-the-counter stool softener) twice a day as needed for constipation.  Do not take Tylenol if you are taking prescription pain medications.  Follow Up  Our office will schedule a follow up appointment 2-3 weeks following discharge.  Please call us immediately for any of the following conditions  Severe or worsening pain in your legs or feet while at rest or while walking Increase pain, redness, warmth, or drainage (pus) from your incision site(s) Fever of 101 degree or higher The swelling in your leg with the bypass suddenly worsens and becomes more painful than when you were in the hospital If you have   been instructed to feel your graft pulse then you should do so every day. If you can no longer feel this pulse, call the office immediately. Not all patients are given this instruction.  Leg swelling is common after leg bypass surgery.  The swelling should improve over a few months following surgery. To improve the swelling, you may elevate your legs above the level of your heart while you are  sitting or resting. Your surgeon or physician assistant may ask you to apply an ACE wrap or wear compression (TED) stockings to help to reduce swelling.  Reduce your risk of vascular disease  Stop smoking. If you would like help call [facility] at 1-800-QUIT-NOW (1-800-784-8669) or [place] at 336-586-4000.  Manage your cholesterol Maintain a desired weight Control your diabetes weight Control your diabetes Keep your blood pressure down  If you have any questions, please call the office at [phone]  

## 2021-01-13 NOTE — Interval H&P Note (Signed)
History and Physical Interval Note:  01/13/2021 7:16 AM  [name] [name]  has presented today for surgery, with the diagnosis of PVD w/claudication.  The various methods of treatment have been discussed with the patient and family. After consideration of risks, benefits and other options for treatment, the patient has consented to  Procedure(s): RIGHT FEMORAL ENDARTERECTOMY (Right) as a surgical intervention.  The patient's history has been reviewed, patient examined, no change in status, stable for surgery.  I have reviewed the patient's chart and labs.  Questions were answered to the patient's satisfaction.     Curt Jews

## 2021-01-13 NOTE — Anesthesia Postprocedure Evaluation (Signed)
Anesthesia Post Note  Patient: [name] [name]  Procedure(s) Performed: RIGHT FEMORAL ENDARTERECTOMY  WITH PATCH ANGIOPLASTY (Right Groin)     Patient location during evaluation: PACU Anesthesia Type: General Level of consciousness: awake and alert Pain management: pain level controlled Vital Signs Assessment: post-procedure vital signs reviewed and stable Respiratory status: spontaneous breathing, nonlabored ventilation and respiratory function stable Cardiovascular status: blood pressure returned to baseline and stable Postop Assessment: no apparent nausea or vomiting Anesthetic complications: no   No complications documented.  Last Vitals:  Vitals:   01/13/21 1300 01/13/21 1330  BP: 115/61 127/70  Pulse: 72 79  Resp: 13 20  Temp:    SpO2: 100% 100%    Last Pain:  Vitals:   01/13/21 1215  TempSrc: Oral  PainSc:                  Catalina Gravel

## 2021-01-14 ENCOUNTER — Encounter (HOSPITAL_COMMUNITY): Payer: Self-pay | Admitting: Vascular Surgery

## 2021-01-14 LAB — CBC
HCT: 38.5 % — ABNORMAL LOW (ref 39.0–52.0)
Hemoglobin: 12.8 g/dL — ABNORMAL LOW (ref 13.0–17.0)
MCH: 28.6 pg (ref 26.0–34.0)
MCHC: 33.2 g/dL (ref 30.0–36.0)
MCV: 85.9 fL (ref 80.0–100.0)
Platelets: 255 10*3/uL (ref 150–400)
RBC: 4.48 MIL/uL (ref 4.22–5.81)
RDW: 13 % (ref 11.5–15.5)
WBC: 12.6 10*3/uL — ABNORMAL HIGH (ref 4.0–10.5)
nRBC: 0 % (ref 0.0–0.2)

## 2021-01-14 LAB — BASIC METABOLIC PANEL
Anion gap: 10 (ref 5–15)
BUN: 17 mg/dL (ref 8–23)
CO2: 22 mmol/L (ref 22–32)
Calcium: 9.4 mg/dL (ref 8.9–10.3)
Chloride: 104 mmol/L (ref 98–111)
Creatinine, Ser: 1.21 mg/dL (ref 0.61–1.24)
GFR, Estimated: >60 mL/min (ref >60)
Glucose, Bld: 174 mg/dL — ABNORMAL HIGH (ref 70–99)
Potassium: 3.7 mmol/L (ref 3.5–5.1)
Sodium: 136 mmol/L (ref 135–145)

## 2021-01-14 LAB — LIPID PANEL
Cholesterol: 109 mg/dL (ref 0–200)
HDL: 44 mg/dL (ref >40)
LDL Cholesterol: 39 mg/dL (ref 0–99)
Total CHOL/HDL Ratio: 2.5 RATIO
Triglycerides: 131 mg/dL (ref <150)
VLDL: 26 mg/dL (ref 0–40)

## 2021-01-14 LAB — GLUCOSE, CAPILLARY: Glucose-Capillary: 183 mg/dL — ABNORMAL HIGH (ref 70–99)

## 2021-01-14 NOTE — Progress Notes (Cosign Needed)
  Progress Note    01/14/2021 7:30 AM 1 Day Post-Op  Subjective:  No complaints.   Vitals:   01/13/21 2313 01/14/21 0327  BP: 134/76 134/74  Pulse: 78 83  Resp: 18 18  Temp: 98.4 F (36.9 C) 98.3 F (36.8 C)  SpO2: 100% 100%    Physical Exam: General appearance: Awake, alert in no apparent distress Cardiac: Heart rate and rhythm are regular Respirations: Nonlabored Incisions: right groin incision  well approximated without bleeding or hematoma Extremities: 2+ right DP pulse. Both feet are warm with intact sensation and motor function.    CBC    Component Value Date/Time   WBC 12.6 (H) 01/14/2021 0105   RBC 4.48 01/14/2021 0105   HGB 12.8 (L) 01/14/2021 0105   HGB 14.7 12/25/2020 1004   HCT 38.5 (L) 01/14/2021 0105   HCT 44.5 12/25/2020 1004   PLT 255 01/14/2021 0105   PLT 293 12/25/2020 1004   MCV 85.9 01/14/2021 0105   MCV 86 12/25/2020 1004   MCH 28.6 01/14/2021 0105   MCHC 33.2 01/14/2021 0105   RDW 13.0 01/14/2021 0105   RDW 13.1 12/25/2020 1004   LYMPHSABS 1.3 08/12/2015 1437   MONOABS 0.9 08/12/2015 1437   EOSABS 0.2 08/12/2015 1437   BASOSABS 0.0 08/12/2015 1437    BMET    Component Value Date/Time   NA 136 01/14/2021 0105   NA 138 12/25/2020 1004   K 3.7 01/14/2021 0105   CL 104 01/14/2021 0105   CO2 22 01/14/2021 0105   GLUCOSE 174 (H) 01/14/2021 0105   BUN 17 01/14/2021 0105   BUN 18 12/25/2020 1004   CREATININE 1.21 01/14/2021 0105   CALCIUM 9.4 01/14/2021 0105   GFRNONAA >60 01/14/2021 0105   GFRAA 82 08/31/2020 1035     Intake/Output Summary (Last 24 hours) at 01/14/2021 0730 Last data filed at 01/14/2021 0626 Gross per 24 hour  Intake 1548.33 ml  Output 3200 ml  Net -1651.67 ml    HOSPITAL MEDICATIONS Scheduled Meds: . amLODipine  10 mg Oral Daily  . aspirin EC  81 mg Oral Daily  . benazepril  40 mg Oral Daily  . clopidogrel  75 mg Oral Daily  . docusate sodium  100 mg Oral Daily  . ezetimibe  10 mg Oral Daily  . heparin   5,000 Units Subcutaneous Q8H  . hydrochlorothiazide  25 mg Oral Daily  . insulin aspart  0-15 Units Subcutaneous TID WC  . isosorbide mononitrate  60 mg Oral Daily  . metFORMIN  1,000 mg Oral BID WC  . metoprolol tartrate  75 mg Oral BID  . multivitamin with minerals  1 tablet Oral Daily  . pantoprazole  40 mg Oral Daily  . rosuvastatin  20 mg Oral Daily  . vitamin B-12  500 mcg Oral Daily   Continuous Infusions: . sodium chloride    . sodium chloride Stopped (01/13/21 1514)  . magnesium sulfate bolus IVPB     PRN Meds:.sodium chloride, acetaminophen **OR** acetaminophen, alum & mag hydroxide-simeth, bisacodyl, guaiFENesin-dextromethorphan, hydrALAZINE, labetalol, magnesium sulfate bolus IVPB, metoprolol tartrate, morphine injection, nitroGLYCERIN, ondansetron, oxyCODONE-acetaminophen, phenol, polyethylene glycol, potassium chloride  Assessment and Plan: POD 1 Right femoral endarterectomy with Dacron patch DC home   -DVT prophylaxis:  SCDs   Risa Grill, PA-C [facility] [phone] 01/14/2021  7:30 AM

## 2021-01-14 NOTE — Progress Notes (Signed)
Patient given discharge instructions medication list and follow up appointments. Patient verbalized understanding. IV and tele were dcd. Will discharge home as ordered. Tamika Shropshire, Bettina Gavia RN

## 2021-01-14 NOTE — Progress Notes (Signed)
Patient's blood glucose level at 1944 was 392 and he was given 15 Units of Novolog insulin subcutaneously according to the sliding scale for meals.  Blood glucose was rechecked at 2119, and it was still high at 322.  Nurse decided to wait a bit and did another blood glucose check at 2217.  It was a level of 360.  Since no night time sliding scale of Novolog insulin was ordered, the nurse paged the on call vascular surgeon, Dr. Trula Slade, and asked if the pt could get more insulin tonight. The doctor ordered a one time 15 Units of Novolog insulin subcutaneous.  Also, informed that the diabetes coordinator will be informed and they will decide what the patient needs in the morning.  Will continue to monitor.  Lupita Dawn, RN

## 2021-01-14 NOTE — Progress Notes (Signed)
PHARMACIST LIPID MONITORING   [name] [name] is a 76 y.o. male admitted on 01/13/2021 with PVD.  Pharmacy has been consulted to optimize lipid-lowering therapy with the indication of secondary prevention for clinical ASCVD.  Recent Labs:  Lipid Panel (last 6 months):   Lab Results  Component Value Date   CHOL 109 01/14/2021   TRIG 131 01/14/2021   HDL 44 01/14/2021   CHOLHDL 2.5 01/14/2021   VLDL 26 01/14/2021   LDLCALC 39 01/14/2021    Hepatic function panel (last 6 months):   Lab Results  Component Value Date   AST 27 01/11/2021   ALT 24 01/11/2021   ALKPHOS 52 01/11/2021   BILITOT 0.7 01/11/2021    SCr (since admission):   Serum creatinine: 1.21 mg/dL 01/14/21 0105 Estimated creatinine clearance: 57.3 mL/min  Current therapy and lipid therapy tolerance Current lipid-lowering therapy: crestor Previous lipid-lowering therapies (if applicable): none Documented or reported allergies or intolerances to lipid-lowering therapies (if applicable): none  Plan:    1.Statin intensity (high intensity recommended for all patients regardless of the LDL):  No statin changes. The patient is already on a high intensity statin.  2.Add ezetimibe (if any one of the following):   Not indicated at this time.  3.Refer to lipid clinic:   No  4.Follow-up with:  Primary care provider - Leighton Ruff, MD  5.Follow-up labs after discharge:  No changes in lipid therapy, repeat a lipid panel in one year.     Hildred Laser, PharmD Clinical Pharmacist **Pharmacist phone directory can now be found on [email or web address] ([email or web address]).  Listed under [place].

## 2021-01-14 NOTE — Progress Notes (Signed)
OT Cancellation Note  Patient Details Name: [name] [name] MRN: [id number] DOB: [birth year]   Cancelled Treatment:    Reason Eval/Treat Not Completed: OT screened, no needs identified, will sign off  Britt Bottom 01/14/2021, 10:46 AM

## 2021-01-15 NOTE — Discharge Summary (Signed)
[facility] Discharge Summary Patient ID: [name] [name] [id number] 75 y.o. [birth year]  Admit date: 01/13/2021  Discharge date and time: 01/14/2021 10:32 AM   Admitting Physician: Rosetta Posner, MD   Discharge Physician: Rosetta Posner, MD  Admission Diagnoses: PAD (peripheral artery disease) ([place]) [I73.9], Limiting claudication right lower extremity  Discharge Diagnoses: PAD (peripheral artery disease) ([place]) [I73.9], Limiting claudication right lower extremity  Admission Condition: good  Discharged Condition: good  Indication for Admission:  Limiting claudication right lower extremity  Hospital Course: On the day of admission the patient was taken to the operating room where he underwent right femoral endarterectomy with Dacron patch angioplasty.  He tolerated procedure well was taken to the recovery room in satisfactory condition.  At the completion of this procedure, he had a 2-3+ palpable dorsalis pedis pulse.  Remained hemodynamically stable.  On postoperative day 1, his vital signs were stable and he was afebrile.  He was voiding spontaneously.  He had no significant postoperative pain.  He was tolerating his diet.  His left lower extremity was well perfused with a palpable right dorsalis pedis pulse.  He was ambulating without difficulty.  He was ready for discharge home with satisfactory condition  Consults: None  Treatments: surgery: See above   Disposition: Discharge disposition: 01-Home or Self Care       - For [facility] Registry use ---  Post-op:  Wound infection: No  Graft infection: No  Transfusion: No  If yes, 0 units given New Arrhythmia: No Patency judged by: [ ]  Dopper only, [ ]  Palpable graft pulse, [ ]  Palpable distal pulse, [ ]  ABI inc. > 0.15, [ ]  Duplex Discharge ABI: R , L  Discharge TBI: R , L  D/C Ambulatory Status: Ambulatory  Complications: MI: [ ]  No, [ ]  Troponin only, [ ]  EKG or Clinical CHF: No Resp failure: [ ]  none, [ ]  Pneumonia, [ ]   Ventilator Chg in renal function: [ ]  none, [ ]  Inc. Cr > 0.5, [ ]  Temp. Dialysis, [ ]  Permanent dialysis Stroke: [ ]  None, [ ]  Minor, [ ]  Major Return to OR: No  Reason for return to OR: [ ]  Bleeding, [ ]  Infection, [ ]  Thrombosis, [ ]  Revision  Discharge medications: Statin use:  Yes ASA use:  Yes Plavix use:  Yes Beta blocker use: Yes Coumadin use: No  for medical reason Not indicated    Patient Instructions:  Allergies as of 01/14/2021      Reactions   Empagliflozin Other (See Comments)   Abdominal pain/dizziness      Medication List    TAKE these medications   amLODipine 10 MG tablet Commonly known as: NORVASC TAKE 1 TABLET BY MOUTH EVERY DAY   aspirin 81 MG EC tablet TAKE 1 TABLET BY MOUTH EVERY DAY   benazepril 40 MG tablet Commonly known as: LOTENSIN Take 40 mg by mouth daily.   clopidogrel 75 MG tablet Commonly known as: PLAVIX Take 1 tablet (75 mg total) by mouth daily.   ezetimibe 10 MG tablet Commonly known as: ZETIA Take 1 tablet (10 mg total) by mouth daily.   hydrochlorothiazide 25 MG tablet Commonly known as: HYDRODIURIL TAKE 1 TABLET BY MOUTH EVERY DAY   isosorbide mononitrate 60 MG 24 hr tablet Commonly known as: IMDUR TAKE 1 TABLET BY MOUTH EVERY DAY   metFORMIN 1000 MG tablet Commonly known as: GLUCOPHAGE Take 1,000 mg by mouth 2 (two) times daily with a meal.   metoprolol tartrate 50 MG  tablet Commonly known as: LOPRESSOR TAKE 1.5 TABLETS (75 MG TOTAL) BY MOUTH 2 (TWO) TIMES DAILY.   multivitamin with minerals Tabs tablet Take 1 tablet by mouth daily.   nitroGLYCERIN 0.4 MG SL tablet Commonly known as: NITROSTAT PLACE 1 TABLET (0.4 MG TOTAL) UNDER THE TONGUE EVERY 5 (FIVE) MINUTES AS NEEDED FOR CHEST PAIN.   NovoLIN N FlexPen ReliOn 100 UNIT/ML Kiwkpen Generic drug: Insulin NPH (Human) (Isophane) Inject 10 Units into the skin in the morning and at bedtime.   Precision QID Test test strip Generic drug: glucose blood 1 each by  Other route as directed.   rosuvastatin 20 MG tablet Commonly known as: CRESTOR TAKE 1 TABLET BY MOUTH EVERY DAY   vitamin B-12 500 MCG tablet Commonly known as: CYANOCOBALAMIN Take 500 mcg by mouth daily. Notes to patient: Take as you were prior to admission    Zinc 50 MG Tabs Take 50 mg by mouth 2 (two) times a week. Notes to patient: Take as you were prior to admission       Activity: no lifting, driving, or strenuous exercise for 2 weeks Diet: cardiac diet Wound Care: keep wound clean and dry  Follow-up with Dr. Donnetta Hutching in 3 weeks.  Signed: Barbie Banner 01/15/2021 12:51 PM

## 2021-01-19 ENCOUNTER — Other Ambulatory Visit: Payer: Self-pay | Admitting: Cardiovascular Disease

## 2021-01-19 DIAGNOSIS — E669 Obesity, unspecified: Secondary | ICD-10-CM | POA: Diagnosis not present

## 2021-01-19 DIAGNOSIS — E119 Type 2 diabetes mellitus without complications: Secondary | ICD-10-CM | POA: Diagnosis not present

## 2021-01-19 DIAGNOSIS — I10 Essential (primary) hypertension: Secondary | ICD-10-CM | POA: Diagnosis not present

## 2021-01-19 DIAGNOSIS — Z794 Long term (current) use of insulin: Secondary | ICD-10-CM | POA: Diagnosis not present

## 2021-02-02 ENCOUNTER — Other Ambulatory Visit: Payer: Self-pay

## 2021-02-02 ENCOUNTER — Ambulatory Visit (INDEPENDENT_AMBULATORY_CARE_PROVIDER_SITE_OTHER): Payer: Medicare Other | Admitting: Cardiovascular Disease

## 2021-02-02 ENCOUNTER — Encounter: Payer: Self-pay | Admitting: Cardiovascular Disease

## 2021-02-02 VITALS — BP 140/72 | HR 98 | Ht 69.0 in | Wt 193.4 lb

## 2021-02-02 DIAGNOSIS — E785 Hyperlipidemia, unspecified: Secondary | ICD-10-CM

## 2021-02-02 DIAGNOSIS — I359 Nonrheumatic aortic valve disorder, unspecified: Secondary | ICD-10-CM

## 2021-02-02 DIAGNOSIS — I739 Peripheral vascular disease, unspecified: Secondary | ICD-10-CM

## 2021-02-02 DIAGNOSIS — I10 Essential (primary) hypertension: Secondary | ICD-10-CM

## 2021-02-02 DIAGNOSIS — I25118 Atherosclerotic heart disease of native coronary artery with other forms of angina pectoris: Secondary | ICD-10-CM | POA: Diagnosis not present

## 2021-02-02 NOTE — Progress Notes (Signed)
Cardiology Office Note   Date:  02/02/2021   ID:  Ansar, Skoda [birth year], MRN [id number]  PCP:  Leighton Ruff, MD  Cardiologist: Dr. [name]  No chief complaint on file.     History of Present Illness: [name] [name] is a 75 y.o. male who is here today for follow-up visit regarding peripheral arterial disease.   He has known history of coronary artery disease status post CABG and subsequent PCI, aortic stenosis, essential hypertension, hyperlipidemia, diabetes mellitus, carotid disease and peripheral arterial disease.  He works as a Corporate treasurer.  He was seen  for right leg claudication.  Doppler studies showed an ABI of 0.85 in the right and 1.21 on the left.  Duplex showed severe calcified stenosis in the distal right common femoral artery with peak velocity close to 500.  No other obstructive disease.  There was moderate stenosis affecting the left common femoral artery as well as a protruding plaque from the posterior wall into the lumen in the distal portion of the artery.  I proceeded with angiography last month which showed no significant aortoiliac disease.  In the right lower extremity, there was severe heavily calcified disease affecting the common femoral artery into the ostium of the profunda, severe TP trunk stenosis with collaterals and severe proximal anterior tibial artery disease with collaterals.  On the left, there was moderate left common femoral artery disease. The patient underwent right common femoral artery endarterectomy by Dr. Donnetta Hutching on March 30 without complications.  He reports complete resolution of right leg claudication.  No chest pain or significant dyspnea.    Past Medical History:  Diagnosis Date  . Arrhythmia    Premature beats  . Atherosclerosis of native arteries of the extremities with intermittent claudication   . Coronary atherosclerosis of artery bypass graft    CABG 2009, LHC 07/2016 restenosis of SVG to OM treated with DES  .  Diabetes mellitus ([place])   . Essential hypertension, benign   . Fatty liver   . History of kidney stones   . Pure hypercholesterolemia     Past Surgical History:  Procedure Laterality Date  . ABDOMINAL AORTOGRAM W/LOWER EXTREMITY N/A 12/30/2020   Procedure: ABDOMINAL AORTOGRAM W/LOWER EXTREMITY;  Surgeon: Wellington Hampshire, MD;  Location: [facility];  Service: Cardiovascular;  Laterality: N/A;  . ADENOIDECTOMY    . CARDIAC CATHETERIZATION N/A 07/18/2016   Procedure: Left Heart Cath and Cors/Grafts Angiography;  Surgeon: Burnell Blanks, MD;  Location: [facility];  Service: Cardiovascular;  Laterality: N/A;  . CARDIAC CATHETERIZATION N/A 07/18/2016   Procedure: Coronary Stent Intervention;  Surgeon: Burnell Blanks, MD;  Location: [facility];  Service: Cardiovascular;  Laterality: N/A;  . CHOLECYSTECTOMY    . CORONARY ANGIOPLASTY WITH STENT PLACEMENT    . CORONARY ARTERY BYPASS GRAFT    . CORONARY STENT INTERVENTION N/A 01/11/2019   Procedure: CORONARY STENT INTERVENTION;  Surgeon: Sherren Mocha, MD;  Location: [facility];  Service: Cardiovascular;  Laterality: N/A;  . ENDARTERECTOMY FEMORAL Right 01/13/2021   Procedure: RIGHT FEMORAL ENDARTERECTOMY  WITH PATCH ANGIOPLASTY;  Surgeon: Rosetta Posner, MD;  Location: [facility];  Service: Vascular;  Laterality: Right;  . INGUINAL HERNIA REPAIR Right   . LEFT HEART CATH AND CORS/GRAFTS ANGIOGRAPHY N/A 01/11/2019   Procedure: LEFT HEART CATH AND CORS/GRAFTS ANGIOGRAPHY;  Surgeon: Sherren Mocha, MD;  Location: [facility];  Service: Cardiovascular;  Laterality: N/A;  . Right femoral  endarterectomy and Dacron patch angioplasty  01/13/3021  . TONSILLECTOMY       Current Outpatient Medications  Medication Sig Dispense Refill  . amLODipine (NORVASC) 10 MG tablet TAKE 1 TABLET BY MOUTH EVERY DAY (Patient taking differently: Take 10 mg by mouth daily.) 90 tablet 2  . aspirin 81 MG EC tablet TAKE 1  TABLET BY MOUTH EVERY DAY 90 tablet 2  . benazepril (LOTENSIN) 40 MG tablet Take 40 mg by mouth daily.     . clopidogrel (PLAVIX) 75 MG tablet Take 1 tablet (75 mg total) by mouth daily. 90 tablet 3  . glucose blood (PRECISION QID TEST) test strip 1 each by Other route as directed.    . hydrochlorothiazide (HYDRODIURIL) 25 MG tablet TAKE 1 TABLET BY MOUTH EVERY DAY 90 tablet 3  . Insulin NPH, Human,, Isophane, (NOVOLIN N FLEXPEN RELION) 100 UNIT/ML Kiwkpen Inject 10 Units into the skin in the morning and at bedtime.    . isosorbide mononitrate (IMDUR) 60 MG 24 hr tablet TAKE 1 TABLET BY MOUTH EVERY DAY (Patient taking differently: Take 60 mg by mouth daily.) 90 tablet 2  . metFORMIN (GLUCOPHAGE) 1000 MG tablet Take 1,000 mg by mouth 2 (two) times daily with a meal.    . metoprolol tartrate (LOPRESSOR) 50 MG tablet TAKE 1.5 TABLETS (75 MG TOTAL) BY MOUTH 2 (TWO) TIMES DAILY. 270 tablet 3  . Multiple Vitamin (MULTIVITAMIN WITH MINERALS) TABS tablet Take 1 tablet by mouth daily.    . nitroGLYCERIN (NITROSTAT) 0.4 MG SL tablet PLACE 1 TABLET (0.4 MG TOTAL) UNDER THE TONGUE EVERY 5 (FIVE) MINUTES AS NEEDED FOR CHEST PAIN. 25 tablet 6  . rosuvastatin (CRESTOR) 20 MG tablet TAKE 1 TABLET BY MOUTH EVERY DAY (Patient taking differently: Take 20 mg by mouth daily.) 90 tablet 3  . vitamin B-12 (CYANOCOBALAMIN) 500 MCG tablet Take 500 mcg by mouth daily.    . Zinc 50 MG TABS Take 50 mg by mouth 2 (two) times a week.    . ezetimibe (ZETIA) 10 MG tablet Take 1 tablet (10 mg total) by mouth daily. 90 tablet 3   No current facility-administered medications for this visit.    Allergies:   Empagliflozin    Social History:  The patient  reports that he has never smoked. He has never used smokeless tobacco. He reports that he does not drink alcohol and does not use drugs.   Family History:  The patient's family history includes Heart disease in his mother.    ROS:  Please see the history of present illness.    Otherwise, review of systems are positive for none.   All other systems are reviewed and negative.    PHYSICAL EXAM: VS:  BP 140/72   Pulse 98   Ht 5' 9"  (1.753 m)   Wt 193 lb 6.4 oz (87.7 kg)   SpO2 97%   BMI 28.56 kg/m  , BMI Body mass index is 28.56 kg/m. GEN: Well nourished, well developed, in no acute distress  HEENT: normal  Neck: no JVD, carotid bruits, or masses Cardiac: RRR; no  rubs, or gallops,no edema .  3 out of 6 systolic murmur in the aortic area which is mid peaking Respiratory:  clear to auscultation bilaterally, normal work of breathing GI: soft, nontender, nondistended, + BS MS: no deformity or atrophy  Skin: warm and dry, no rash Neuro:  Strength and sensation are intact Psych: euthymic mood, full affect Vascular: Well-healing surgical scar in the right groin.  Distal pulses are palpable.   EKG:  EKG is not ordered today.    Recent Labs: 01/11/2021: ALT 24 01/14/2021: BUN 17; Creatinine, Ser 1.21; Hemoglobin 12.8; Platelets 255; Potassium 3.7; Sodium 136    Lipid Panel    Component Value Date/Time   CHOL 109 01/14/2021 0105   CHOL 129 04/27/2020 0824   TRIG 131 01/14/2021 0105   HDL 44 01/14/2021 0105   HDL 39 (L) 04/27/2020 0824   CHOLHDL 2.5 01/14/2021 0105   VLDL 26 01/14/2021 0105   LDLCALC 39 01/14/2021 0105   LDLCALC 63 04/27/2020 0824      Wt Readings from Last 3 Encounters:  02/02/21 193 lb 6.4 oz (87.7 kg)  01/13/21 203 lb (92.1 kg)  01/11/21 203 lb 3.2 oz (92.2 kg)       No flowsheet data found.    ASSESSMENT AND PLAN:  1.  Peripheral arterial disease: Status post recent right common femoral artery endarterectomy for severe claudication.  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  Continue medical therapy.  He does have moderate left common femoral artery stenosis but does not seem to be obstructive.    2.  Coronary artery disease involving native coronary arteries: He has stable  class II angina.  Continue medical therapy  3.  Hyperlipidemia: Currently on rosuvastatin 20 mg daily and Zetia.  Most recent lipid profile showed an LDL of 63.  4.  Essential hypertension: Blood pressure is controlled on current medications.  5.  Moderate aortic stenosis: Follow-up with Dr. [name] as planned.    Disposition:   FU with me in 6 months  Signed,  [name], MD  02/02/2021 10:36 AM    [place]

## 2021-02-02 NOTE — Patient Instructions (Signed)

## 2021-02-08 ENCOUNTER — Other Ambulatory Visit: Payer: Self-pay

## 2021-02-08 ENCOUNTER — Ambulatory Visit (INDEPENDENT_AMBULATORY_CARE_PROVIDER_SITE_OTHER): Payer: Medicare Other | Admitting: Vascular Surgery

## 2021-02-08 ENCOUNTER — Encounter: Payer: Self-pay | Admitting: Vascular Surgery

## 2021-02-08 VITALS — BP 118/73 | HR 86 | Temp 98.4°F | Resp 14 | Ht 69.0 in | Wt 195.0 lb

## 2021-02-08 DIAGNOSIS — I2510 Atherosclerotic heart disease of native coronary artery without angina pectoris: Secondary | ICD-10-CM | POA: Diagnosis not present

## 2021-02-08 DIAGNOSIS — E7800 Pure hypercholesterolemia, unspecified: Secondary | ICD-10-CM | POA: Diagnosis not present

## 2021-02-08 DIAGNOSIS — E1165 Type 2 diabetes mellitus with hyperglycemia: Secondary | ICD-10-CM | POA: Diagnosis not present

## 2021-02-08 DIAGNOSIS — N400 Enlarged prostate without lower urinary tract symptoms: Secondary | ICD-10-CM | POA: Diagnosis not present

## 2021-02-08 DIAGNOSIS — I739 Peripheral vascular disease, unspecified: Secondary | ICD-10-CM

## 2021-02-08 DIAGNOSIS — I10 Essential (primary) hypertension: Secondary | ICD-10-CM | POA: Diagnosis not present

## 2021-02-08 NOTE — Progress Notes (Signed)
[facility]  Patient name: [name] [name] MRN: [id number] DOB: [birth year] Sex: male  REASON FOR VISIT: Follow-up right femoral endarterectomy and patch angioplasty on 01/13/2021  HPI: [name] [name] is a 75 y.o. male here today for follow-up.  He had presented with limiting claudication.  Arteriography revealed high-grade coral reef plaque in the right common femoral artery.  He had normal profunda and superficial femoral artery with moderate tibial disease below this.  On the left he had mild plaque in the common femoral artery.  He underwent uneventful right carotid endarterectomy and patch angioplasty on 01/13/2021 and was discharged home.  He has done well since then.  He reports complete resolution of his claudication symptoms.  Current Outpatient Medications  Medication Sig Dispense Refill  . amLODipine (NORVASC) 10 MG tablet TAKE 1 TABLET BY MOUTH EVERY DAY (Patient taking differently: Take 10 mg by mouth daily.) 90 tablet 2  . aspirin 81 MG EC tablet TAKE 1 TABLET BY MOUTH EVERY DAY 90 tablet 2  . benazepril (LOTENSIN) 40 MG tablet Take 40 mg by mouth daily.     . clopidogrel (PLAVIX) 75 MG tablet Take 1 tablet (75 mg total) by mouth daily. 90 tablet 3  . glucose blood (PRECISION QID TEST) test strip 1 each by Other route as directed.    . hydrochlorothiazide (HYDRODIURIL) 25 MG tablet TAKE 1 TABLET BY MOUTH EVERY DAY 90 tablet 3  . Insulin NPH, Human,, Isophane, (NOVOLIN N FLEXPEN RELION) 100 UNIT/ML Kiwkpen Inject 10 Units into the skin in the morning and at bedtime.    . isosorbide mononitrate (IMDUR) 60 MG 24 hr tablet TAKE 1 TABLET BY MOUTH EVERY DAY (Patient taking differently: Take 60 mg by mouth daily.) 90 tablet 2  . metFORMIN (GLUCOPHAGE) 1000 MG tablet Take 1,000 mg by mouth 2 (two) times daily with a meal.    . metoprolol tartrate (LOPRESSOR) 50 MG tablet TAKE 1.5 TABLETS (75 MG TOTAL) BY MOUTH 2 (TWO) TIMES DAILY.  270 tablet 3  . Multiple Vitamin (MULTIVITAMIN WITH MINERALS) TABS tablet Take 1 tablet by mouth daily.    . nitroGLYCERIN (NITROSTAT) 0.4 MG SL tablet PLACE 1 TABLET (0.4 MG TOTAL) UNDER THE TONGUE EVERY 5 (FIVE) MINUTES AS NEEDED FOR CHEST PAIN. 25 tablet 6  . rosuvastatin (CRESTOR) 20 MG tablet TAKE 1 TABLET BY MOUTH EVERY DAY (Patient taking differently: Take 20 mg by mouth daily.) 90 tablet 3  . vitamin B-12 (CYANOCOBALAMIN) 500 MCG tablet Take 500 mcg by mouth daily.    . Zinc 50 MG TABS Take 50 mg by mouth 2 (two) times a week.    . ezetimibe (ZETIA) 10 MG tablet Take 1 tablet (10 mg total) by mouth daily. 90 tablet 3   No current facility-administered medications for this visit.     PHYSICAL EXAM: Vitals:   02/08/21 1410  BP: 118/73  Pulse: 86  Resp: 14  Temp: 98.4 F (36.9 C)  TempSrc: Other (Comment)  SpO2: 97%  Weight: 195 lb (88.5 kg)  Height: 5' 9"  (1.753 m)    GENERAL: The patient is a well-nourished male, in no acute distress. The vital signs are documented above. He looks quite good today.  He has a completely healed right groin incision.  He has 2+ dorsalis pedis pulses bilaterally.  He has a 1+ right posterior tibial pulse and 2+ left posterior tibial pulse  MEDICAL ISSUES: Stable status post right common femoral endarterectomy and patch.  We will  continue to resume full activities with no limitation.  He will see [place] again on an as-needed basis.  He is being considered for TAVR and I do not see any contraindication from vascular surgical standpoint   Rosetta Posner, MD FACS [facility] Office Tel [phone]  Note: Portions of this report may have been transcribed using voice recognition software.  Every effort has been made to ensure accuracy; however, inadvertent computerized transcription errors may still be present.

## 2021-02-22 ENCOUNTER — Ambulatory Visit (INDEPENDENT_AMBULATORY_CARE_PROVIDER_SITE_OTHER): Payer: Medicare Other | Admitting: Cardiovascular Disease

## 2021-02-22 ENCOUNTER — Encounter: Payer: Self-pay | Admitting: Cardiovascular Disease

## 2021-02-22 ENCOUNTER — Other Ambulatory Visit: Payer: Self-pay | Admitting: Cardiovascular Disease

## 2021-02-22 ENCOUNTER — Ambulatory Visit (HOSPITAL_COMMUNITY): Payer: Medicare Other | Attending: Cardiology

## 2021-02-22 ENCOUNTER — Other Ambulatory Visit: Payer: Self-pay

## 2021-02-22 VITALS — BP 116/60 | HR 70 | Ht 69.0 in | Wt 192.8 lb

## 2021-02-22 DIAGNOSIS — I25118 Atherosclerotic heart disease of native coronary artery with other forms of angina pectoris: Secondary | ICD-10-CM | POA: Diagnosis not present

## 2021-02-22 DIAGNOSIS — I739 Peripheral vascular disease, unspecified: Secondary | ICD-10-CM

## 2021-02-22 DIAGNOSIS — E782 Mixed hyperlipidemia: Secondary | ICD-10-CM

## 2021-02-22 DIAGNOSIS — I10 Essential (primary) hypertension: Secondary | ICD-10-CM | POA: Diagnosis not present

## 2021-02-22 DIAGNOSIS — I350 Nonrheumatic aortic (valve) stenosis: Secondary | ICD-10-CM

## 2021-02-22 LAB — ECHOCARDIOGRAM COMPLETE
AR max vel: 1.13 cm2
AV Area VTI: 1 cm2
AV Area mean vel: 1.14 cm2
AV Mean grad: 20 mmHg
AV Peak grad: 36.7 mmHg
Ao pk vel: 3.03 m/s
Area-P 1/2: 3.46 cm2
S' Lateral: 3.2 cm

## 2021-02-22 NOTE — Progress Notes (Signed)
Cardiology Office Note:    Date:  02/22/2021   ID:  [name], [name] 12/10/1945, MRN [id number]  PCP:  Leighton Ruff, MD   [facility] Cardiologist:  Sherren Mocha, MD     Referring MD: Leighton Ruff, MD   Chief Complaint  Patient presents with  . Coronary Artery Disease    History of Present Illness:    [name] [name] is a 75 y.o. male with a hx of:  Coronary artery disease  ? S/p CABG in 2009 ? s/p NSTEMI soon after surgery in 2009: PCI to distal SVG ? Cath in 2012: patent bypass grafts ? Unstable angina 10/17 >> PCI: DES to Dist S-OM2; all other grafts patent ? Cath 12/2018: patent grafts; oRPDA 95 >>PCI: DES to RPDA   Aortic stenosis ? Echocardiogram 8/17: mean 11 mmHg  Hypertension   Hyperlipidemia   Diabetes mellitus   PAD - Right femoral endarterectomy - Dr Donnetta Hutching -   Carotid Artery Disease  The patient is here alone today.  He is doing well after undergoing right common femoral endarterectomy January 13, 2021.  His leg pain has completely resolved.  He has had no significant edema.  He denies any recent chest pain or pressure.  He denies shortness of breath, orthopnea, PND, or heart palpitations.  The patient had an echocardiogram just prior to his office visit and we have reviewed that during his visit.   Past Medical History:  Diagnosis Date  . Arrhythmia    Premature beats  . Atherosclerosis of native arteries of the extremities with intermittent claudication   . Coronary atherosclerosis of artery bypass graft    CABG 2009, LHC 07/2016 restenosis of SVG to OM treated with DES  . Diabetes mellitus ([place])   . Essential hypertension, benign   . Fatty liver   . History of kidney stones   . Pure hypercholesterolemia     Past Surgical History:  Procedure Laterality Date  . ABDOMINAL AORTOGRAM W/LOWER EXTREMITY N/A 12/30/2020   Procedure: ABDOMINAL AORTOGRAM W/LOWER EXTREMITY;  Surgeon: Wellington Hampshire, MD;  Location: [facility];  Service: Cardiovascular;  Laterality: N/A;  . ADENOIDECTOMY    . CARDIAC CATHETERIZATION N/A 07/18/2016   Procedure: Left Heart Cath and Cors/Grafts Angiography;  Surgeon: Burnell Blanks, MD;  Location: [facility];  Service: Cardiovascular;  Laterality: N/A;  . CARDIAC CATHETERIZATION N/A 07/18/2016   Procedure: Coronary Stent Intervention;  Surgeon: Burnell Blanks, MD;  Location: [facility];  Service: Cardiovascular;  Laterality: N/A;  . CHOLECYSTECTOMY    . CORONARY ANGIOPLASTY WITH STENT PLACEMENT    . CORONARY ARTERY BYPASS GRAFT    . CORONARY STENT INTERVENTION N/A 01/11/2019   Procedure: CORONARY STENT INTERVENTION;  Surgeon: Sherren Mocha, MD;  Location: [facility];  Service: Cardiovascular;  Laterality: N/A;  . ENDARTERECTOMY FEMORAL Right 01/13/2021   Procedure: RIGHT FEMORAL ENDARTERECTOMY  WITH PATCH ANGIOPLASTY;  Surgeon: Rosetta Posner, MD;  Location: [facility];  Service: Vascular;  Laterality: Right;  . INGUINAL HERNIA REPAIR Right   . LEFT HEART CATH AND CORS/GRAFTS ANGIOGRAPHY N/A 01/11/2019   Procedure: LEFT HEART CATH AND CORS/GRAFTS ANGIOGRAPHY;  Surgeon: Sherren Mocha, MD;  Location: [facility];  Service: Cardiovascular;  Laterality: N/A;  . Right femoral endarterectomy and Dacron patch angioplasty  01/13/3021  . TONSILLECTOMY      Current Medications: Current Meds  Medication Sig  . amLODipine (NORVASC) 10 MG tablet TAKE 1 TABLET  BY MOUTH EVERY DAY  . aspirin 81 MG EC tablet TAKE 1 TABLET BY MOUTH EVERY DAY  . benazepril (LOTENSIN) 40 MG tablet Take 40 mg by mouth daily.   . clopidogrel (PLAVIX) 75 MG tablet Take 1 tablet (75 mg total) by mouth daily.  Marland Kitchen glucose blood (PRECISION QID TEST) test strip 1 each by Other route as directed.  . hydrochlorothiazide (HYDRODIURIL) 25 MG tablet TAKE 1 TABLET BY MOUTH EVERY DAY  . INSULIN NPH, HUMAN,, ISOPHANE, [place] Inject 100 Int'l Units into the skin in the morning and at bedtime.  Per patient taking 15 units  . Insulin Pen Needle (B-D UF III MINI PEN NEEDLES) 31G X 5 MM MISC Use to inject Ozempic once a week  . isosorbide mononitrate (IMDUR) 60 MG 24 hr tablet TAKE 1 TABLET BY MOUTH EVERY DAY  . metFORMIN (GLUCOPHAGE) 1000 MG tablet Take 1,000 mg by mouth 2 (two) times daily with a meal.  . metoprolol tartrate (LOPRESSOR) 50 MG tablet TAKE 1.5 TABLETS (75 MG TOTAL) BY MOUTH 2 (TWO) TIMES DAILY.  . Multiple Vitamin (MULTIVITAMIN WITH MINERALS) TABS tablet Take 1 tablet by mouth daily.  . nitroGLYCERIN (NITROSTAT) 0.4 MG SL tablet PLACE 1 TABLET (0.4 MG TOTAL) UNDER THE TONGUE EVERY 5 (FIVE) MINUTES AS NEEDED FOR CHEST PAIN.  . rosuvastatin (CRESTOR) 20 MG tablet TAKE 1 TABLET BY MOUTH EVERY DAY  . Semaglutide,0.25 or 0.5MG/DOS, 2 MG/1.5ML SOPN Inject into the skin.     Allergies:   Empagliflozin   Social History   Socioeconomic History  . Marital status: Married    Spouse name: Not on file  . Number of children: Not on file  . Years of education: Not on file  . Highest education level: Not on file  Occupational History  . Not on file  Tobacco Use  . Smoking status: Never Smoker  . Smokeless tobacco: Never Used  Vaping Use  . Vaping Use: Never used  Substance and Sexual Activity  . Alcohol use: No  . Drug use: No  . Sexual activity: Not on file  Other Topics Concern  . Not on file  Social History Narrative  . Not on file   Social Determinants of Health   Financial Resource Strain: Not on file  Food Insecurity: Not on file  Transportation Needs: Not on file  Physical Activity: Not on file  Stress: Not on file  Social Connections: Not on file     Family History: The patient's family history includes Heart disease in his mother. There is no history of Diabetes.  ROS:   Please see the history of present illness.    All other systems reviewed and are negative.  EKGs/Labs/Other Studies Reviewed:    The following studies were reviewed  today: Today's 2D echo is reviewed and shows normal LV systolic function.  RV function was normal.  The aortic valve is moderately calcified and thickened with restricted leaflet mobility but only moderate aortic stenosis is noted with a mean transvalvular gradient of 20 mmHg.  This is stable when compared to his echo study from last year.  EKG:  EKG is not ordered today.   Recent Labs: 01/11/2021: ALT 24 01/14/2021: BUN 17; Creatinine, Ser 1.21; Hemoglobin 12.8; Platelets 255; Potassium 3.7; Sodium 136  Recent Lipid Panel    Component Value Date/Time   CHOL 109 01/14/2021 0105   CHOL 129 04/27/2020 0824   TRIG 131 01/14/2021 0105   HDL 44 01/14/2021 0105   HDL 39 (L)  04/27/2020 0824   CHOLHDL 2.5 01/14/2021 0105   VLDL 26 01/14/2021 0105   LDLCALC 39 01/14/2021 0105   LDLCALC 63 04/27/2020 0824     Risk Assessment/Calculations:       Physical Exam:    VS:  BP 116/60   Pulse 70   Ht 5' 9"  (1.753 m)   Wt 192 lb 12.8 oz (87.5 kg)   SpO2 99%   BMI 28.47 kg/m     Wt Readings from Last 3 Encounters:  02/22/21 192 lb 12.8 oz (87.5 kg)  02/08/21 195 lb (88.5 kg)  02/02/21 193 lb 6.4 oz (87.7 kg)     GEN:  Well nourished, well developed in no acute distress HEENT: Normal NECK: No JVD; No carotid bruits LYMPHATICS: No lymphadenopathy CARDIAC: RRR, 2/6 harsh early to mid peaking crescendo decrescendo murmur at the right upper sternal border RESPIRATORY:  Clear to auscultation without rales, wheezing or rhonchi  ABDOMEN: Soft, non-tender, non-distended MUSCULOSKELETAL:  No edema; No deformity  SKIN: Warm and dry NEUROLOGIC:  Alert and oriented x 3 PSYCHIATRIC:  Normal affect   ASSESSMENT:    1. Moderate aortic stenosis   2. PAD (peripheral artery disease) ([place])   3. Coronary artery disease of native artery of native heart with stable angina pectoris ([place])   4. Mixed hyperlipidemia   5. Essential hypertension    PLAN:    In order of problems listed above:  1. The  patient's echo images are personally reviewed.  He has findings consistent with moderate aortic stenosis and he remains asymptomatic.  I have asked him to return in 1 year for follow-up evaluation with a repeat echocardiogram.  Comparison is made between this year's study and last years with no significant change noted. 2. The patient's claudication symptoms have resolved since he underwent right common femoral endarterectomy by Dr. Donnetta Hutching.  He continues on antiplatelet therapy with aspirin 81 mg daily and clopidogrel 75 mg daily. 3. The patient's anginal symptoms are minimal at present.  He states they are less than what he was feeling last year.  He continues on amlodipine and metoprolol for antianginal therapy, dual antiplatelet therapy, and a high intensity statin drug with rosuvastatin 20 mg daily. 4. Lipids are excellent with an LDL cholesterol of 39 mg/dL. 5. Blood pressure is well controlled on current medical program.  Medication Adjustments/Labs and Tests Ordered: Current medicines are reviewed at length with the patient today.  Concerns regarding medicines are outlined above.  Orders Placed This Encounter  Procedures  . ECHOCARDIOGRAM COMPLETE   No orders of the defined types were placed in this encounter.   Patient Instructions  Medication Instructions:  Your provider recommends that you continue on your current medications as directed. Please refer to the Current Medication list given to you today.   *If you need a refill on your cardiac medications before your next appointment, please call your pharmacy*   Follow-Up: You will be called to arrange your 1 year echo and office visit.    Signed, Sherren Mocha, MD  02/22/2021 4:05 PM    [facility]

## 2021-02-22 NOTE — Patient Instructions (Signed)
Medication Instructions:  Your provider recommends that you continue on your current medications as directed. Please refer to the Current Medication list given to you today.   *If you need a refill on your cardiac medications before your next appointment, please call your pharmacy*   Follow-Up: You will be called to arrange your 1 year echo and office visit.

## 2021-03-01 ENCOUNTER — Other Ambulatory Visit: Payer: Self-pay | Admitting: Cardiovascular Disease

## 2021-03-06 ENCOUNTER — Other Ambulatory Visit: Payer: Self-pay

## 2021-03-06 DIAGNOSIS — I6523 Occlusion and stenosis of bilateral carotid arteries: Secondary | ICD-10-CM

## 2021-04-08 ENCOUNTER — Other Ambulatory Visit: Payer: Self-pay | Admitting: Cardiovascular Disease

## 2021-04-13 ENCOUNTER — Other Ambulatory Visit: Payer: Self-pay

## 2021-04-13 MED ORDER — CLOPIDOGREL BISULFATE 75 MG PO TABS: 75.0000 mg | ORAL_TABLET | Freq: Every day | ORAL | 3 refills | Status: DC

## 2021-04-13 NOTE — Telephone Encounter (Signed)
Pt's medication was sent to pt's pharmacy as requested. Confirmation received.  °

## 2021-05-10 DIAGNOSIS — Z79899 Other long term (current) drug therapy: Secondary | ICD-10-CM | POA: Diagnosis not present

## 2021-05-10 DIAGNOSIS — Z7984 Long term (current) use of oral hypoglycemic drugs: Secondary | ICD-10-CM | POA: Diagnosis not present

## 2021-05-10 DIAGNOSIS — Z6828 Body mass index (BMI) 28.0-28.9, adult: Secondary | ICD-10-CM | POA: Diagnosis not present

## 2021-05-10 DIAGNOSIS — E669 Obesity, unspecified: Secondary | ICD-10-CM | POA: Diagnosis not present

## 2021-05-10 DIAGNOSIS — Z7982 Long term (current) use of aspirin: Secondary | ICD-10-CM | POA: Diagnosis not present

## 2021-05-10 DIAGNOSIS — I10 Essential (primary) hypertension: Secondary | ICD-10-CM | POA: Diagnosis not present

## 2021-05-10 DIAGNOSIS — Z951 Presence of aortocoronary bypass graft: Secondary | ICD-10-CM | POA: Diagnosis not present

## 2021-05-10 DIAGNOSIS — Z794 Long term (current) use of insulin: Secondary | ICD-10-CM | POA: Diagnosis not present

## 2021-05-10 DIAGNOSIS — I2510 Atherosclerotic heart disease of native coronary artery without angina pectoris: Secondary | ICD-10-CM | POA: Diagnosis not present

## 2021-05-10 DIAGNOSIS — E785 Hyperlipidemia, unspecified: Secondary | ICD-10-CM | POA: Diagnosis not present

## 2021-05-10 DIAGNOSIS — E1159 Type 2 diabetes mellitus with other circulatory complications: Secondary | ICD-10-CM | POA: Diagnosis not present

## 2021-05-21 ENCOUNTER — Other Ambulatory Visit: Payer: Self-pay | Admitting: Cardiovascular Disease

## 2021-06-01 DIAGNOSIS — R972 Elevated prostate specific antigen [PSA]: Secondary | ICD-10-CM | POA: Diagnosis not present

## 2021-07-20 ENCOUNTER — Other Ambulatory Visit: Payer: Self-pay | Admitting: *Deleted

## 2021-07-20 DIAGNOSIS — I6523 Occlusion and stenosis of bilateral carotid arteries: Secondary | ICD-10-CM

## 2021-07-27 ENCOUNTER — Encounter: Payer: Self-pay | Admitting: Cardiovascular Disease

## 2021-07-27 ENCOUNTER — Other Ambulatory Visit: Payer: Self-pay

## 2021-07-27 ENCOUNTER — Ambulatory Visit (INDEPENDENT_AMBULATORY_CARE_PROVIDER_SITE_OTHER): Payer: Medicare Other | Admitting: Cardiovascular Disease

## 2021-07-27 VITALS — BP 128/72 | HR 63 | Ht 69.0 in | Wt 193.6 lb

## 2021-07-27 DIAGNOSIS — I25119 Atherosclerotic heart disease of native coronary artery with unspecified angina pectoris: Secondary | ICD-10-CM

## 2021-07-27 DIAGNOSIS — I739 Peripheral vascular disease, unspecified: Secondary | ICD-10-CM | POA: Diagnosis not present

## 2021-07-27 DIAGNOSIS — I10 Essential (primary) hypertension: Secondary | ICD-10-CM

## 2021-07-27 DIAGNOSIS — I359 Nonrheumatic aortic valve disorder, unspecified: Secondary | ICD-10-CM

## 2021-07-27 DIAGNOSIS — E785 Hyperlipidemia, unspecified: Secondary | ICD-10-CM | POA: Diagnosis not present

## 2021-07-27 DIAGNOSIS — E669 Obesity, unspecified: Secondary | ICD-10-CM | POA: Diagnosis not present

## 2021-07-27 DIAGNOSIS — E119 Type 2 diabetes mellitus without complications: Secondary | ICD-10-CM | POA: Diagnosis not present

## 2021-07-27 DIAGNOSIS — I6523 Occlusion and stenosis of bilateral carotid arteries: Secondary | ICD-10-CM | POA: Diagnosis not present

## 2021-07-27 NOTE — Patient Instructions (Signed)
Medication Instructions:  No changes *If you need a refill on your cardiac medications before your next appointment, please call your pharmacy*   Lab Work: None ordered If you have labs (blood work) drawn today and your tests are completely normal, you will receive your results only by: [place] (if you have MyChart) OR A paper copy in the mail If you have any lab test that is abnormal or we need to change your treatment, we will call you to review the results.   Testing/Procedures: Your physician has requested that you have an echocardiogram. Echocardiography is a painless test that uses sound waves to create images of your heart. It provides your doctor with information about the size and shape of your heart and how well your heart's chambers and valves are working. You may receive an ultrasound enhancing agent through an IV if needed to better visualize your heart during the echo.This procedure takes approximately one hour. There are no restrictions for this procedure. This will take place at the [address], [address].    Follow-Up: At [facility], you and your health needs are our priority.  As part of our continuing mission to provide you with exceptional heart care, we have created designated Provider Care Teams.  These Care Teams include your primary Cardiologist (physician) and Advanced Practice Providers (APPs -  Physician Assistants and Nurse Practitioners) who all work together to provide you with the care you need, when you need it.  We recommend signing up for the patient portal called "MyChart".  Sign up information is provided on this After Visit Summary.  MyChart is used to connect with patients for Virtual Visits (Telemedicine).  Patients are able to view lab/test results, encounter notes, upcoming appointments, etc.  Non-urgent messages can be sent to your provider as well.   To learn more about what you can do with MyChart, go to [email or web address].     Your next appointment:   12 month(s)  The format for your next appointment:   In Person  Provider:   [name], MD

## 2021-07-27 NOTE — Progress Notes (Signed)
Cardiology Office Note   Date:  07/27/2021   ID:  Sufian, Ravi [birth year], MRN [id number]  PCP:  Leighton Ruff, MD  Cardiologist: Dr. [name]  No chief complaint on file.     History of Present Illness: ZAI CHMIEL is a 75 y.o. male who is here today for follow-up visit regarding peripheral arterial disease.   He has known history of coronary artery disease status post CABG and subsequent PCI, aortic stenosis, essential hypertension, hyperlipidemia, diabetes mellitus, carotid disease and peripheral arterial disease.  He works as a Corporate treasurer.  He was seen  for right leg claudication.  Doppler studies showed an ABI of 0.85 in the right and 1.21 on the left.  Duplex showed severe calcified stenosis in the distal right common femoral artery with peak velocity close to 500.  No other obstructive disease.  There was moderate stenosis affecting the left common femoral artery as well as a protruding plaque from the posterior wall into the lumen in the distal portion of the artery.  He is status post right common femoral artery endarterectomy in March of this year by Dr. Donnetta Hutching with complete resolution of right leg claudication.  He continues to work as a Corporate treasurer.  Over the last 2 months, he noted substernal chest pain and tightness after walking back from delivery.  The pain lasted for few minutes and subsides when he sits in the car.  He did have a more prolonged episode yesterday and had to take 1 nitroglycerin.  The chest pain does not happen with regular activities.  He has no leg claudication.   Past Medical History:  Diagnosis Date   Arrhythmia    Premature beats   Atherosclerosis of native arteries of the extremities with intermittent claudication    Coronary atherosclerosis of artery bypass graft    CABG 2009, LHC 07/2016 restenosis of SVG to OM treated with DES   Diabetes mellitus ([place])    Essential hypertension, benign    Fatty liver    History of  kidney stones    Pure hypercholesterolemia     Past Surgical History:  Procedure Laterality Date   ABDOMINAL AORTOGRAM W/LOWER EXTREMITY N/A 12/30/2020   Procedure: ABDOMINAL AORTOGRAM W/LOWER EXTREMITY;  Surgeon: Wellington Hampshire, MD;  Location: [facility];  Service: Cardiovascular;  Laterality: N/A;   ADENOIDECTOMY     CARDIAC CATHETERIZATION N/A 07/18/2016   Procedure: Left Heart Cath and Cors/Grafts Angiography;  Surgeon: Burnell Blanks, MD;  Location: [facility];  Service: Cardiovascular;  Laterality: N/A;   CARDIAC CATHETERIZATION N/A 07/18/2016   Procedure: Coronary Stent Intervention;  Surgeon: Burnell Blanks, MD;  Location: [facility];  Service: Cardiovascular;  Laterality: N/A;   CHOLECYSTECTOMY     CORONARY ANGIOPLASTY WITH STENT PLACEMENT     CORONARY ARTERY BYPASS GRAFT     CORONARY STENT INTERVENTION N/A 01/11/2019   Procedure: CORONARY STENT INTERVENTION;  Surgeon: Sherren Mocha, MD;  Location: [facility];  Service: Cardiovascular;  Laterality: N/A;   ENDARTERECTOMY FEMORAL Right 01/13/2021   Procedure: RIGHT FEMORAL ENDARTERECTOMY  WITH PATCH ANGIOPLASTY;  Surgeon: Rosetta Posner, MD;  Location: [facility];  Service: Vascular;  Laterality: Right;   INGUINAL HERNIA REPAIR Right    LEFT HEART CATH AND CORS/GRAFTS ANGIOGRAPHY N/A 01/11/2019   Procedure: LEFT HEART CATH AND CORS/GRAFTS ANGIOGRAPHY;  Surgeon: Sherren Mocha, MD;  Location: [facility];  Service: Cardiovascular;  Laterality: N/A;  Right femoral endarterectomy and Dacron patch angioplasty  01/13/3021   TONSILLECTOMY       Current Outpatient Medications  Medication Sig Dispense Refill   amLODipine (NORVASC) 10 MG tablet TAKE 1 TABLET BY MOUTH EVERY DAY 90 tablet 2   aspirin 81 MG EC tablet TAKE 1 TABLET BY MOUTH EVERY DAY 90 tablet 3   benazepril (LOTENSIN) 40 MG tablet Take 40 mg by mouth daily.      clopidogrel (PLAVIX) 75 MG tablet Take 1 tablet (75 mg total)  by mouth daily. 90 tablet 3   glucose blood (PRECISION QID TEST) test strip 1 each by Other route as directed.     hydrochlorothiazide (HYDRODIURIL) 25 MG tablet TAKE 1 TABLET BY MOUTH EVERY DAY 90 tablet 3   INSULIN NPH, HUMAN,, ISOPHANE, [place] Inject 100 Int'l Units into the skin in the morning and at bedtime. Per patient taking 15 units     Insulin Pen Needle (B-D UF III MINI PEN NEEDLES) 31G X 5 MM MISC Use to inject Ozempic once a week     isosorbide mononitrate (IMDUR) 60 MG 24 hr tablet TAKE 1 TABLET BY MOUTH EVERY DAY 90 tablet 3   metFORMIN (GLUCOPHAGE) 1000 MG tablet Take 1,000 mg by mouth 2 (two) times daily with a meal.     metoprolol tartrate (LOPRESSOR) 50 MG tablet TAKE 1.5 TABLETS (75 MG TOTAL) BY MOUTH 2 (TWO) TIMES DAILY. 270 tablet 3   Multiple Vitamin (MULTIVITAMIN WITH MINERALS) TABS tablet Take 1 tablet by mouth daily.     nitroGLYCERIN (NITROSTAT) 0.4 MG SL tablet PLACE 1 TABLET (0.4 MG TOTAL) UNDER THE TONGUE EVERY 5 (FIVE) MINUTES AS NEEDED FOR CHEST PAIN. 25 tablet 6   rosuvastatin (CRESTOR) 20 MG tablet TAKE 1 TABLET BY MOUTH EVERY DAY 90 tablet 3   Semaglutide,0.25 or 0.5MG/DOS, 2 MG/1.5ML SOPN Inject into the skin.     ezetimibe (ZETIA) 10 MG tablet Take 1 tablet (10 mg total) by mouth daily. 90 tablet 3   No current facility-administered medications for this visit.    Allergies:   Empagliflozin    Social History:  The patient  reports that he has never smoked. He has never used smokeless tobacco. He reports that he does not drink alcohol and does not use drugs.   Family History:  The patient's family history includes Heart disease in his mother.    ROS:  Please see the history of present illness.   Otherwise, review of systems are positive for none.   All other systems are reviewed and negative.    PHYSICAL EXAM: VS:  BP 128/72 (BP Location: Left Arm, Patient Position: Sitting, Cuff Size: Large)   Pulse 63   Ht 5' 9"  (1.753 m)   Wt 193 lb 9.6 oz (87.8 kg)    SpO2 99%   BMI 28.59 kg/m  , BMI Body mass index is 28.59 kg/m. GEN: Well nourished, well developed, in no acute distress  HEENT: normal  Neck: no JVD, carotid bruits, or masses Cardiac: RRR; no  rubs, or gallops,no edema .  3 out of 6 systolic murmur in the aortic area which is mid peaking Respiratory:  clear to auscultation bilaterally, normal work of breathing GI: soft, nontender, nondistended, + BS MS: no deformity or atrophy  Skin: warm and dry, no rash Neuro:  Strength and sensation are intact Psych: euthymic mood, full affect Vascular: Femoral pulses are normal.  Distal pulses are normal as well.   EKG:  EKG is ordered  today. EKG shows normal sinus rhythm without ST or T wave changes.   Recent Labs: 01/11/2021: ALT 24 01/14/2021: BUN 17; Creatinine, Ser 1.21; Hemoglobin 12.8; Platelets 255; Potassium 3.7; Sodium 136    Lipid Panel    Component Value Date/Time   CHOL 109 01/14/2021 0105   CHOL 129 04/27/2020 0824   TRIG 131 01/14/2021 0105   HDL 44 01/14/2021 0105   HDL 39 (L) 04/27/2020 0824   CHOLHDL 2.5 01/14/2021 0105   VLDL 26 01/14/2021 0105   LDLCALC 39 01/14/2021 0105   LDLCALC 63 04/27/2020 0824      Wt Readings from Last 3 Encounters:  07/27/21 193 lb 9.6 oz (87.8 kg)  02/22/21 192 lb 12.8 oz (87.5 kg)  02/08/21 195 lb (88.5 kg)       No flowsheet data found.    ASSESSMENT AND PLAN:  1.  Peripheral arterial disease: Status post right common femoral artery endarterectomy for severe claudication.  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  The patient has normal femoral and distal pulses.  Continue medical therapy.  2.  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  Fortunately, his EKG does not show any ischemic changes. I am going to repeat his  echocardiogram to ensure stability of moderate aortic stenosis and will discuss with Dr. [name] regarding possible need for repeat cardiac catheterization given that he is on maximal antianginal therapy.  3.  Hyperlipidemia: Currently on rosuvastatin 20 mg daily and Zetia.  Most recent lipid profile showed an LDL of 63.  4.  Essential hypertension: Blood pressure is controlled on current medications.  5.  Moderate aortic stenosis: Repeat echocardiogram as outlined above.    Disposition:   FU with me in 12 months  Signed,  [name], MD  07/27/2021 11:28 AM    [place]

## 2021-08-03 ENCOUNTER — Encounter (HOSPITAL_COMMUNITY): Payer: Medicare Other

## 2021-08-03 ENCOUNTER — Ambulatory Visit: Payer: Medicare Other | Admitting: Vascular Surgery

## 2021-08-03 ENCOUNTER — Ambulatory Visit: Payer: Medicare Other | Admitting: Cardiovascular Disease

## 2021-08-04 DIAGNOSIS — E119 Type 2 diabetes mellitus without complications: Secondary | ICD-10-CM | POA: Diagnosis not present

## 2021-08-09 ENCOUNTER — Other Ambulatory Visit: Payer: Self-pay

## 2021-08-09 ENCOUNTER — Ambulatory Visit (HOSPITAL_COMMUNITY): Payer: Medicare Other | Attending: Cardiovascular Disease

## 2021-08-09 DIAGNOSIS — I359 Nonrheumatic aortic valve disorder, unspecified: Secondary | ICD-10-CM

## 2021-08-09 LAB — ECHOCARDIOGRAM COMPLETE
AR max vel: 1.01 cm2
AV Area VTI: 1.12 cm2
AV Area mean vel: 1.02 cm2
AV Mean grad: 20 mmHg
AV Peak grad: 36.7 mmHg
Ao pk vel: 3.03 m/s
Area-P 1/2: 2.66 cm2
S' Lateral: 2.4 cm

## 2021-08-11 DIAGNOSIS — E119 Type 2 diabetes mellitus without complications: Secondary | ICD-10-CM | POA: Diagnosis not present

## 2021-08-11 DIAGNOSIS — I10 Essential (primary) hypertension: Secondary | ICD-10-CM | POA: Diagnosis not present

## 2021-08-11 DIAGNOSIS — E785 Hyperlipidemia, unspecified: Secondary | ICD-10-CM | POA: Diagnosis not present

## 2021-08-11 DIAGNOSIS — Z6828 Body mass index (BMI) 28.0-28.9, adult: Secondary | ICD-10-CM | POA: Diagnosis not present

## 2021-08-11 DIAGNOSIS — Z7984 Long term (current) use of oral hypoglycemic drugs: Secondary | ICD-10-CM | POA: Diagnosis not present

## 2021-08-11 DIAGNOSIS — Z794 Long term (current) use of insulin: Secondary | ICD-10-CM | POA: Diagnosis not present

## 2021-08-11 DIAGNOSIS — Z79899 Other long term (current) drug therapy: Secondary | ICD-10-CM | POA: Diagnosis not present

## 2021-08-11 DIAGNOSIS — E663 Overweight: Secondary | ICD-10-CM | POA: Diagnosis not present

## 2021-08-11 DIAGNOSIS — Z951 Presence of aortocoronary bypass graft: Secondary | ICD-10-CM | POA: Diagnosis not present

## 2021-08-24 DIAGNOSIS — E119 Type 2 diabetes mellitus without complications: Secondary | ICD-10-CM | POA: Diagnosis not present

## 2021-08-24 DIAGNOSIS — Z7984 Long term (current) use of oral hypoglycemic drugs: Secondary | ICD-10-CM | POA: Diagnosis not present

## 2021-08-24 DIAGNOSIS — H35371 Puckering of macula, right eye: Secondary | ICD-10-CM | POA: Diagnosis not present

## 2021-08-24 DIAGNOSIS — Z794 Long term (current) use of insulin: Secondary | ICD-10-CM | POA: Diagnosis not present

## 2021-08-24 DIAGNOSIS — Z961 Presence of intraocular lens: Secondary | ICD-10-CM | POA: Diagnosis not present

## 2021-08-31 ENCOUNTER — Ambulatory Visit (INDEPENDENT_AMBULATORY_CARE_PROVIDER_SITE_OTHER): Payer: Medicare Other | Admitting: Vascular Surgery

## 2021-08-31 ENCOUNTER — Encounter: Payer: Self-pay | Admitting: Vascular Surgery

## 2021-08-31 ENCOUNTER — Other Ambulatory Visit: Payer: Self-pay

## 2021-08-31 ENCOUNTER — Ambulatory Visit (HOSPITAL_COMMUNITY)
Admission: RE | Admit: 2021-08-31 | Discharge: 2021-08-31 | Disposition: A | Discharge status: Home / Self Care | Payer: Medicare Other | Source: Ambulatory Visit | Attending: Vascular Surgery | Admitting: Vascular Surgery

## 2021-08-31 VITALS — BP 140/67 | HR 61 | Temp 98.7°F | Resp 16 | Ht 69.0 in | Wt 198.8 lb

## 2021-08-31 DIAGNOSIS — I6523 Occlusion and stenosis of bilateral carotid arteries: Secondary | ICD-10-CM | POA: Diagnosis not present

## 2021-08-31 NOTE — Progress Notes (Signed)
Patient name: [name] [name] MRN: [id number] DOB: 1946/01/31 Sex: male  REASON FOR CONSULT: 1 year follow-up for surveillance of carotid artery disease  HPI: [name] [name] is a 75 y.o. male with history of coronary artery disease status post CABG in 2009, diabetes, hypertension, history of NSTEMI that presents for surveillance of his carotid artery disease.   He was initially referred for right carotid stenosis after his PCP heard a bruit.  He ultimately had an ultrasound at [facility] radiology that was concerning for 70% right ICA stenosis.  A repeat ultrasound here suggested stenosis less than 50% and we have been following his carotid disease with surveillance ultrasound.  On 1 year follow-up today he has no new concerns.  He has had no history of strokes or TIAs since last follow-up.  No other focal neurologic symptoms.  He has been having exertional chest pain.  He is following up with his cardiologist tomorrow.  Past Medical History:  Diagnosis Date   Arrhythmia    Premature beats   Atherosclerosis of native arteries of the extremities with intermittent claudication    Coronary atherosclerosis of artery bypass graft    CABG 2009, LHC 07/2016 restenosis of SVG to OM treated with DES   Diabetes mellitus ([place])    Essential hypertension, benign    Fatty liver    History of kidney stones    Pure hypercholesterolemia     Past Surgical History:  Procedure Laterality Date   ABDOMINAL AORTOGRAM W/LOWER EXTREMITY N/A 12/30/2020   Procedure: ABDOMINAL AORTOGRAM W/LOWER EXTREMITY;  Surgeon: Wellington Hampshire, MD;  Location: [place] [facility];  Service: Cardiovascular;  Laterality: N/A;   ADENOIDECTOMY     CARDIAC CATHETERIZATION N/A 07/18/2016   Procedure: Left Heart Cath and Cors/Grafts Angiography;  Surgeon: Burnell Blanks, MD;  Location: [place];  Service: Cardiovascular;  Laterality: N/A;   CARDIAC CATHETERIZATION N/A 07/18/2016   Procedure: Coronary Stent  Intervention;  Surgeon: Burnell Blanks, MD;  Location: [facility];  Service: Cardiovascular;  Laterality: N/A;   CHOLECYSTECTOMY     CORONARY ANGIOPLASTY WITH STENT PLACEMENT     CORONARY ARTERY BYPASS GRAFT     CORONARY STENT INTERVENTION N/A 01/11/2019   Procedure: CORONARY STENT INTERVENTION;  Surgeon: Sherren Mocha, MD;  Location: [facility];  Service: Cardiovascular;  Laterality: N/A;   ENDARTERECTOMY FEMORAL Right 01/13/2021   Procedure: RIGHT FEMORAL ENDARTERECTOMY  WITH PATCH ANGIOPLASTY;  Surgeon: Rosetta Posner, MD;  Location: [facility];  Service: Vascular;  Laterality: Right;   INGUINAL HERNIA REPAIR Right    LEFT HEART CATH AND CORS/GRAFTS ANGIOGRAPHY N/A 01/11/2019   Procedure: LEFT HEART CATH AND CORS/GRAFTS ANGIOGRAPHY;  Surgeon: Sherren Mocha, MD;  Location: [facility];  Service: Cardiovascular;  Laterality: N/A;   Right femoral endarterectomy and Dacron patch angioplasty  01/13/3021   TONSILLECTOMY      Family History  Problem Relation Age of Onset   Heart disease Mother        No details   Diabetes Neg Hx     SOCIAL HISTORY: Social History   Socioeconomic History   Marital status: Married    Spouse name: Not on file   Number of children: Not on file   Years of education: Not on file   Highest education level: Not on file  Occupational History   Not on file  Tobacco Use   Smoking status: Never   Smokeless tobacco: Never  Vaping Use  Vaping Use: Never used  Substance and Sexual Activity   Alcohol use: No   Drug use: No   Sexual activity: Not on file  Other Topics Concern   Not on file  Social History Narrative   Not on file   Social Determinants of Health   Financial Resource Strain: Not on file  Food Insecurity: Not on file  Transportation Needs: Not on file  Physical Activity: Not on file  Stress: Not on file  Social Connections: Not on file  Intimate Partner Violence: Not on file    Allergies  Allergen  Reactions   Empagliflozin Other (See Comments)    Abdominal pain/dizziness    Current Outpatient Medications  Medication Sig Dispense Refill   amLODipine (NORVASC) 10 MG tablet TAKE 1 TABLET BY MOUTH EVERY DAY 90 tablet 2   aspirin 81 MG EC tablet TAKE 1 TABLET BY MOUTH EVERY DAY 90 tablet 3   benazepril (LOTENSIN) 40 MG tablet Take 40 mg by mouth daily.      clopidogrel (PLAVIX) 75 MG tablet Take 1 tablet (75 mg total) by mouth daily. 90 tablet 3   glucose blood (PRECISION QID TEST) test strip 1 each by Other route as directed.     hydrochlorothiazide (HYDRODIURIL) 25 MG tablet TAKE 1 TABLET BY MOUTH EVERY DAY 90 tablet 3   INSULIN NPH, HUMAN,, ISOPHANE, [place] Inject 100 Int'l Units into the skin in the morning and at bedtime. Per patient taking 15 units     Insulin Pen Needle (B-D UF III MINI PEN NEEDLES) 31G X 5 MM MISC Use to inject Ozempic once a week     isosorbide mononitrate (IMDUR) 60 MG 24 hr tablet TAKE 1 TABLET BY MOUTH EVERY DAY 90 tablet 3   metFORMIN (GLUCOPHAGE) 1000 MG tablet Take 1,000 mg by mouth 2 (two) times daily with a meal.     metoprolol tartrate (LOPRESSOR) 50 MG tablet TAKE 1.5 TABLETS (75 MG TOTAL) BY MOUTH 2 (TWO) TIMES DAILY. 270 tablet 3   Multiple Vitamin (MULTIVITAMIN WITH MINERALS) TABS tablet Take 1 tablet by mouth daily.     nitroGLYCERIN (NITROSTAT) 0.4 MG SL tablet PLACE 1 TABLET (0.4 MG TOTAL) UNDER THE TONGUE EVERY 5 (FIVE) MINUTES AS NEEDED FOR CHEST PAIN. 25 tablet 6   rosuvastatin (CRESTOR) 20 MG tablet TAKE 1 TABLET BY MOUTH EVERY DAY 90 tablet 3   Semaglutide,0.25 or 0.5MG/DOS, 2 MG/1.5ML SOPN Inject into the skin.     ezetimibe (ZETIA) 10 MG tablet Take 1 tablet (10 mg total) by mouth daily. 90 tablet 3   No current facility-administered medications for this visit.    REVIEW OF SYSTEMS:  [X]  denotes positive finding, [ ]  denotes negative finding Cardiac  Comments:  Chest pain or chest pressure:    Shortness of breath upon exertion:    Short  of breath when lying flat:    Irregular heart rhythm:        Vascular    Pain in calf, thigh, or hip brought on by ambulation:    Pain in feet at night that wakes you up from your sleep:     Blood clot in your veins:    Leg swelling:         Pulmonary    Oxygen at home:    Productive cough:     Wheezing:         Neurologic    Sudden weakness in arms or legs:     Sudden numbness in arms or  legs:     Sudden onset of difficulty speaking or slurred speech:    Temporary loss of vision in one eye:     Problems with dizziness:         Gastrointestinal    Blood in stool:     Vomited blood:         Genitourinary    Burning when urinating:     Blood in urine:        Psychiatric    Major depression:         Hematologic    Bleeding problems:    Problems with blood clotting too easily:        Skin    Rashes or ulcers:        Constitutional    Fever or chills:      PHYSICAL EXAM: Vitals:   08/31/21 1327  BP: 140/67  Pulse: 61  Resp: 16  Temp: 98.7 F (37.1 C)  TempSrc: Temporal  SpO2: 98%  Weight: 198 lb 12.8 oz (90.2 kg)  Height: 5' 9"  (1.753 m)    GENERAL: The patient is a well-nourished male, in no acute distress. The vital signs are documented above. CARDIAC: There is a regular rate and rhythm.  VASCULAR:  2+ bilateral femoral pulse palpable groins PULMONARY: No respiratory distress. ABDOMEN: Soft and non-tender. MUSCULOSKELETAL: There are no major deformities or cyanosis. NEUROLOGIC: No focal weakness or paresthesias are detected.  CN II-XII grossly intact.  DATA:   Carotid duplex today shows velocities consistent with 1 to 39% stenosis bilaterally.  Assessment/Plan:  75 year old male that presents for surveillance of his carotid artery disease.  His carotid artery disease remains asymptomatic.  There was previous concern for a moderate right ICA stenosis and we have never been able to demonstrate that on duplex in our office.  His duplex again today  shows 1 to 39% stenosis bilaterally.  Discussed current guidelines would be surgery for greater than 80% stenosis.  We will continue medical management for now with ongoing surveillance and will repeat a duplex in 1 year in our office.  He knows to call with questions or concerns.    Marty Heck, MD [facility] Office: [place]

## 2021-09-01 ENCOUNTER — Encounter: Payer: Self-pay | Admitting: Cardiovascular Disease

## 2021-09-01 ENCOUNTER — Ambulatory Visit (INDEPENDENT_AMBULATORY_CARE_PROVIDER_SITE_OTHER): Payer: Medicare Other | Admitting: Cardiovascular Disease

## 2021-09-01 VITALS — BP 120/70 | HR 60 | Ht 69.0 in | Wt 200.2 lb

## 2021-09-01 DIAGNOSIS — I10 Essential (primary) hypertension: Secondary | ICD-10-CM | POA: Diagnosis not present

## 2021-09-01 DIAGNOSIS — E782 Mixed hyperlipidemia: Secondary | ICD-10-CM | POA: Diagnosis not present

## 2021-09-01 DIAGNOSIS — I25119 Atherosclerotic heart disease of native coronary artery with unspecified angina pectoris: Secondary | ICD-10-CM | POA: Diagnosis not present

## 2021-09-01 DIAGNOSIS — I6523 Occlusion and stenosis of bilateral carotid arteries: Secondary | ICD-10-CM

## 2021-09-01 DIAGNOSIS — R079 Chest pain, unspecified: Secondary | ICD-10-CM | POA: Diagnosis not present

## 2021-09-01 DIAGNOSIS — I350 Nonrheumatic aortic (valve) stenosis: Secondary | ICD-10-CM

## 2021-09-01 NOTE — Progress Notes (Signed)
Cardiology Office Note:    Date:  09/01/2021   ID:  [name] [name], DOB 08/17/1946, MRN [id number]  PCP:  Leighton Ruff, MD (Inactive)   [facility] Providers Cardiologist:  Sherren Mocha, MD     Referring MD: Leighton Ruff, MD   Chief Complaint  Patient presents with   Coronary Artery Disease    History of Present Illness:    [name] [name] is a 75 y.o. male with a hx of coronary and peripheral arterial disease, presenting for follow-up evaluation today.  He has undergone CABG and PCI procedures after bypass surgery.  His last PCI procedure in 2020 involved stenting of calcific lesion in the right PDA beyond the vein graft insertion site.  The patient underwent right common femoral endarterectomy earlier this year to treat severe common femoral artery stenosis and right leg claudication.  He comes in today for further evaluation of chest pain.  He works as a Corporate treasurer and he has been having more and more exertional angina.  He describes tightness in the chest that occurs with walking or physical exertion.  He occasionally has this at night especially when he lays in certain positions.  He has taken nitroglycerin with prompt relief of his symptoms.  There is no associated shortness of breath, diaphoresis, nausea, lightheadedness, or syncope.  Symptoms have been present for several months but have worsened over the last few weeks.  Past Medical History:  Diagnosis Date   Arrhythmia    Premature beats   Atherosclerosis of native arteries of the extremities with intermittent claudication    Coronary atherosclerosis of artery bypass graft    CABG 2009, LHC 07/2016 restenosis of SVG to OM treated with DES   Diabetes mellitus ([place])    Essential hypertension, benign    Fatty liver    History of kidney stones    Pure hypercholesterolemia     Past Surgical History:  Procedure Laterality Date   ABDOMINAL AORTOGRAM W/LOWER EXTREMITY N/A 12/30/2020   Procedure: ABDOMINAL  AORTOGRAM W/LOWER EXTREMITY;  Surgeon: Wellington Hampshire, MD;  Location: [place] [facility];  Service: Cardiovascular;  Laterality: N/A;   ADENOIDECTOMY     CARDIAC CATHETERIZATION N/A 07/18/2016   Procedure: Left Heart Cath and Cors/Grafts Angiography;  Surgeon: Burnell Blanks, MD;  Location: [place] [facility];  Service: Cardiovascular;  Laterality: N/A;   CARDIAC CATHETERIZATION N/A 07/18/2016   Procedure: Coronary Stent Intervention;  Surgeon: Burnell Blanks, MD;  Location: [place] [facility];  Service: Cardiovascular;  Laterality: N/A;   CHOLECYSTECTOMY     CORONARY ANGIOPLASTY WITH STENT PLACEMENT     CORONARY ARTERY BYPASS GRAFT     CORONARY STENT INTERVENTION N/A 01/11/2019   Procedure: CORONARY STENT INTERVENTION;  Surgeon: Sherren Mocha, MD;  Location: [place] [facility];  Service: Cardiovascular;  Laterality: N/A;   ENDARTERECTOMY FEMORAL Right 01/13/2021   Procedure: RIGHT FEMORAL ENDARTERECTOMY  WITH PATCH ANGIOPLASTY;  Surgeon: Rosetta Posner, MD;  Location: [place];  Service: Vascular;  Laterality: Right;   INGUINAL HERNIA REPAIR Right    LEFT HEART CATH AND CORS/GRAFTS ANGIOGRAPHY N/A 01/11/2019   Procedure: LEFT HEART CATH AND CORS/GRAFTS ANGIOGRAPHY;  Surgeon: Sherren Mocha, MD;  Location: [place] [facility];  Service: Cardiovascular;  Laterality: N/A;   Right femoral endarterectomy and Dacron patch angioplasty  01/13/3021   TONSILLECTOMY      Current Medications: Current Meds  Medication Sig   amLODipine (NORVASC) 10 MG tablet TAKE 1 TABLET BY MOUTH EVERY  DAY   aspirin 81 MG EC tablet TAKE 1 TABLET BY MOUTH EVERY DAY   benazepril (LOTENSIN) 40 MG tablet Take 40 mg by mouth daily.    clopidogrel (PLAVIX) 75 MG tablet Take 1 tablet (75 mg total) by mouth daily.   glucose blood (PRECISION QID TEST) test strip 1 each by Other route as directed.   hydrochlorothiazide (HYDRODIURIL) 25 MG tablet TAKE 1 TABLET BY MOUTH EVERY DAY   INSULIN NPH, HUMAN,, ISOPHANE, [place]  Inject 100 Int'l Units into the skin in the morning and at bedtime. Per patient taking 15 units   Insulin Pen Needle (B-D UF III MINI PEN NEEDLES) 31G X 5 MM MISC Use to inject Ozempic once a week   isosorbide mononitrate (IMDUR) 60 MG 24 hr tablet TAKE 1 TABLET BY MOUTH EVERY DAY   metFORMIN (GLUCOPHAGE) 1000 MG tablet Take 1,000 mg by mouth 2 (two) times daily with a meal.   metoprolol tartrate (LOPRESSOR) 50 MG tablet TAKE 1.5 TABLETS (75 MG TOTAL) BY MOUTH 2 (TWO) TIMES DAILY.   Multiple Vitamin (MULTIVITAMIN WITH MINERALS) TABS tablet Take 1 tablet by mouth daily.   nitroGLYCERIN (NITROSTAT) 0.4 MG SL tablet PLACE 1 TABLET (0.4 MG TOTAL) UNDER THE TONGUE EVERY 5 (FIVE) MINUTES AS NEEDED FOR CHEST PAIN.   rosuvastatin (CRESTOR) 20 MG tablet TAKE 1 TABLET BY MOUTH EVERY DAY     Allergies:   Empagliflozin   Social History   Socioeconomic History   Marital status: Married    Spouse name: Not on file   Number of children: Not on file   Years of education: Not on file   Highest education level: Not on file  Occupational History   Not on file  Tobacco Use   Smoking status: Never   Smokeless tobacco: Never  Vaping Use   Vaping Use: Never used  Substance and Sexual Activity   Alcohol use: No   Drug use: No   Sexual activity: Not on file  Other Topics Concern   Not on file  Social History Narrative   Not on file   Social Determinants of Health   Financial Resource Strain: Not on file  Food Insecurity: Not on file  Transportation Needs: Not on file  Physical Activity: Not on file  Stress: Not on file  Social Connections: Not on file     Family History: The patient's family history includes Heart disease in his mother. There is no history of Diabetes.  ROS:   Please see the history of present illness.    All other systems reviewed and are negative.  EKGs/Labs/Other Studies Reviewed:    The following studies were reviewed today: Cardiac Catheterization  01/11/2019: Conclusion    Ost LAD to Prox LAD lesion is 100% stenosed. Dist LAD lesion is 70% stenosed. Ost Cx to Prox Cx lesion is 99% stenosed. Prox RCA to Mid RCA lesion is 100% stenosed. Dist RCA lesion is 40% stenosed. SVG and is normal in caliber. SVG and is normal in caliber. SVG and is normal in caliber. LIMA and is normal in caliber. Previously placed Dist Graft to Insertion stent (unknown type) is widely patent. Ost RPDA to RPDA lesion is 95% stenosed. A drug-eluting stent was successfully placed using a STENT SYNERGY DES 2.25X16. Post intervention, there is a 0% residual stenosis.   1.  Severe native vessel coronary artery disease with known total occlusion of the LAD, left circumflex, and RCA 2.  Status post aortocoronary bypass surgery with continued patency of  the LIMA to LAD, continued patency of the saphenous vein graft to diagonal, continued patency of the saphenous vein graft and stented segment into OM 2, and continued patency of the saphenous vein graft to distal RCA with severe downstream stenosis involving the PDA origin 3.  Successful PCI of the right PDA using high-pressure balloon angioplasty followed by stenting with a 2.25 x 16 mm Synergy DES postdilated to high pressure with a 2.5 mm noncompliant balloon   Recommend: Same day discharge per protocol, continue aspirin and ticagrelor long-term  Echo 08/09/2021: 1. Left ventricular ejection fraction, by estimation, is 60 to 65%. The  left ventricle has normal function. The left ventricle has no regional  wall motion abnormalities. There is mild concentric left ventricular  hypertrophy. Left ventricular diastolic  parameters were normal.   2. Right ventricular systolic function is normal. The right ventricular  size is normal. Tricuspid regurgitation signal is inadequate for assessing  PA pressure.   3. The mitral valve is grossly normal. Trivial mitral valve  regurgitation. No evidence of mitral stenosis.    4. The aortic valve is tricuspid. There is moderate calcification of the  aortic valve. There is moderate thickening of the aortic valve. Aortic  valve regurgitation is not visualized. Moderate aortic valve stenosis.  Aortic valve area, by VTI measures  1.12 cm. Aortic valve mean gradient measures 20.0 mmHg. Aortic valve Vmax  measures 3.03 m/s.   5. The inferior vena cava is normal in size with greater than 50%  respiratory variability, suggesting right atrial pressure of 3 mmHg.   Comparison(s): No significant change from prior study.   EKG:  EKG is ordered today.  The ekg ordered today demonstrates normal sinus rhythm 60 bpm, occasional PVC, T wave abnormality consider inferolateral ischemia  Recent Labs: 01/11/2021: ALT 24 01/14/2021: BUN 17; Creatinine, Ser 1.21; Hemoglobin 12.8; Platelets 255; Potassium 3.7; Sodium 136  Recent Lipid Panel    Component Value Date/Time   CHOL 109 01/14/2021 0105   CHOL 129 04/27/2020 0824   TRIG 131 01/14/2021 0105   HDL 44 01/14/2021 0105   HDL 39 (L) 04/27/2020 0824   CHOLHDL 2.5 01/14/2021 0105   VLDL 26 01/14/2021 0105   LDLCALC 39 01/14/2021 0105   LDLCALC 63 04/27/2020 0824     Risk Assessment/Calculations:           Physical Exam:    VS:  BP 120/70   Pulse 60   Ht 5' 9"  (1.753 m)   Wt 200 lb 3.2 oz (90.8 kg)   SpO2 97%   BMI 29.56 kg/m     Wt Readings from Last 3 Encounters:  09/01/21 200 lb 3.2 oz (90.8 kg)  08/31/21 198 lb 12.8 oz (90.2 kg)  07/27/21 193 lb 9.6 oz (87.8 kg)     GEN:  Well nourished, well developed in no acute distress HEENT: Normal NECK: No JVD; No carotid bruits LYMPHATICS: No lymphadenopathy CARDIAC: RRR, 2/6 harsh mid peaking systolic murmur at the right upper sternal border RESPIRATORY:  Clear to auscultation without rales, wheezing or rhonchi  ABDOMEN: Soft, non-tender, non-distended MUSCULOSKELETAL:  No edema; No deformity  SKIN: Warm and dry NEUROLOGIC:  Alert and oriented x  3 PSYCHIATRIC:  Normal affect   ASSESSMENT:    1. Coronary artery disease involving native coronary artery of native heart with angina pectoris ([place])   2. Chest pain of uncertain etiology   3. Moderate aortic stenosis   4. Essential hypertension   5. Mixed hyperlipidemia  PLAN:    In order of problems listed above:  Patient with typical symptoms of worsening angina despite multiple antianginal drugs with maximally tolerated doses of amlodipine, isosorbide, and metoprolol tartrate.  He continues on high intensity statin drug as well as dual antiplatelet therapy.  I reviewed his previous cardiac catheterization films which demonstrate continued bypass graft patency and severe right PDA stenosis which was treated with stenting in 2020.  Considering his progressive, CCS functional class III symptoms, I have recommended cardiac catheterization and possible PTCA/stenting. I have reviewed the risks, indications, and alternatives to cardiac catheterization, possible angioplasty, and stenting with the patient. Risks include but are not limited to bleeding, infection, vascular injury, stroke, myocardial infection, arrhythmia, kidney injury, radiation-related injury in the case of prolonged fluoroscopy use, emergency cardiac surgery, and death. The patient understands the risks of serious complication is 1-2 in 5003 with diagnostic cardiac cath and 1-2% or less with angioplasty/stenting.   As above, symptoms consistent with typical angina Recent echo reviewed, moderately calcified and restricted aortic valve leaflets with a mean transvalvular gradient of 20 mmHg consistent with moderate aortic stenosis.  Continue annual echo surveillance. Blood pressure is controlled on multidrug regimen as above Treated with Zetia and rosuvastatin.  Last LDL cholesterol was 39 mg/dL.      Shared Decision Making/Informed Consent The risks [stroke (1 in 1000), death (1 in 1000), kidney failure [usually temporary] (1 in  500), bleeding (1 in 200), allergic reaction [possibly serious] (1 in 200)], benefits (diagnostic support and management of coronary artery disease) and alternatives of a cardiac catheterization were discussed in detail with Mr. Franchini and he is willing to proceed.    Medication Adjustments/Labs and Tests Ordered: Current medicines are reviewed at length with the patient today.  Concerns regarding medicines are outlined above.  Orders Placed This Encounter  Procedures   Basic metabolic panel   CBC   EKG 12-Lead    No orders of the defined types were placed in this encounter.   Patient Instructions  Medication Instructions:  Your physician recommends that you continue on your current medications as directed. Please refer to the Current Medication list given to you today.  *If you need a refill on your cardiac medications before your next appointment, please call your pharmacy*   Lab Work: BMET and CBC today  If you have labs (blood work) drawn today and your tests are completely normal, you will receive your results only by: [place] (if you have MyChart) OR A paper copy in the mail If you have any lab test that is abnormal or we need to change your treatment, we will call you to review the results.   Testing/Procedures: Your physician has requested that you have a cardiac catheterization. Cardiac catheterization is used to diagnose and/or treat various heart conditions. Doctors may recommend this procedure for a number of different reasons. The most common reason is to evaluate chest pain. Chest pain can be a symptom of coronary artery disease (CAD), and cardiac catheterization can show whether plaque is narrowing or blocking your heart's arteries. This procedure is also used to evaluate the valves, as well as measure the blood flow and oxygen levels in different parts of your heart. For further information please visit HugeFiesta.[email or web address]. Please follow instruction sheet, as  given.   Follow-Up: At [facility], you and your health needs are our priority.  As part of our continuing mission to provide you with exceptional heart care, we have created designated Provider Care Teams.  These Care Teams include your primary Cardiologist (physician) and Advanced Practice Providers (APPs -  Physician Assistants and Nurse Practitioners) who all work together to provide you with the care you need, when you need it.  We recommend signing up for the patient portal called "MyChart".  Sign up information is provided on this After Visit Summary.  MyChart is used to connect with patients for Virtual Visits (Telemedicine).  Patients are able to view lab/test results, encounter notes, upcoming appointments, etc.  Non-urgent messages can be sent to your provider as well.   To learn more about what you can do with MyChart, go to [email or web address].    Your next appointment:   2-3 week(s)  The format for your next appointment:   In Person  Provider:   Sherren Mocha, MD     Other Instructions  [facility] OFFICE [place], [place] [address] Dept: [phone] Loc: [place]  09/01/2021  You are scheduled for a Cardiac Catheterization on Friday, November 25 with Dr. Sherren Mocha.  1. Please arrive at the [facility] (Main Entrance A) at [facility]: [address] [place], [place] [address] at 5:30 AM (This time is two hours before your procedure to ensure your preparation). Free valet parking service is available.   Special note: Every effort is made to have your procedure done on time. Please understand that emergencies sometimes delay scheduled procedures.  2. Diet: Do not eat solid foods after midnight.  The patient may have clear liquids until 5am upon the day of the procedure.  3. Labs: You will have labs drawn today.  4. Medication  instructions in preparation for your procedure:   Contrast Allergy: No  Take only 7.5 units of insulin the night before your procedure. Do not take any insulin on the day of the procedure.  Do not take Diabetes Med Glucophage (Metformin) on the day of the procedure and HOLD 48 HOURS AFTER THE PROCEDURE.  On the morning of your procedure, take your Aspirin and Plavix/Clopidogrel and any morning medicines NOT listed above.  You may use sips of water.  5. Plan for one night stay--bring personal belongings. 6. Bring a current list of your medications and current insurance cards. 7. You MUST have a responsible person to drive you home. 8. Someone MUST be with you the first 24 hours after you arrive home or your discharge will be delayed. 9. Please wear clothes that are easy to get on and off and wear slip-on shoes.  Thank you for allowing [place] to care for you!   -- [facility] Invasive Cardiovascular services     Signed, Sherren Mocha, MD  09/01/2021 6:03 PM    [place]

## 2021-09-01 NOTE — Patient Instructions (Signed)
Medication Instructions:  Your physician recommends that you continue on your current medications as directed. Please refer to the Current Medication list given to you today.  *If you need a refill on your cardiac medications before your next appointment, please call your pharmacy*   Lab Work: BMET and CBC today  If you have labs (blood work) drawn today and your tests are completely normal, you will receive your results only by: [place] (if you have MyChart) OR A paper copy in the mail If you have any lab test that is abnormal or we need to change your treatment, we will call you to review the results.   Testing/Procedures: Your physician has requested that you have a cardiac catheterization. Cardiac catheterization is used to diagnose and/or treat various heart conditions. Doctors may recommend this procedure for a number of different reasons. The most common reason is to evaluate chest pain. Chest pain can be a symptom of coronary artery disease (CAD), and cardiac catheterization can show whether plaque is narrowing or blocking your heart's arteries. This procedure is also used to evaluate the valves, as well as measure the blood flow and oxygen levels in different parts of your heart. For further information please visit [email or web address].[email or web address]. Please follow instruction sheet, as given.   Follow-Up: At [facility], you and your health needs are our priority.  As part of our continuing mission to provide you with exceptional heart care, we have created designated Provider Care Teams.  These Care Teams include your primary Cardiologist (physician) and Advanced Practice Providers (APPs -  Physician Assistants and Nurse Practitioners) who all work together to provide you with the care you need, when you need it.  We recommend signing up for the patient portal called "MyChart".  Sign up information is provided on this After Visit Summary.  MyChart is used to connect with patients for  Virtual Visits (Telemedicine).  Patients are able to view lab/test results, encounter notes, upcoming appointments, etc.  Non-urgent messages can be sent to your provider as well.   To learn more about what you can do with MyChart, go to [email or web address].    Your next appointment:   2-3 week(s)  The format for your next appointment:   In Person  Provider:   Sherren Mocha, MD     Other Instructions  [place] OFFICE [place], [place] [address] Dept: [phone] Loc: [place]  09/01/2021  You are scheduled for a Cardiac Catheterization on Friday, November 25 with Dr. Sherren Mocha.  1. Please arrive at the [facility] (Main Entrance A) at [facility]: [address] [place], [place] [address] at 5:30 AM (This time is two hours before your procedure to ensure your preparation). Free valet parking service is available.   Special note: Every effort is made to have your procedure done on time. Please understand that emergencies sometimes delay scheduled procedures.  2. Diet: Do not eat solid foods after midnight.  The patient may have clear liquids until 5am upon the day of the procedure.  3. Labs: You will have labs drawn today.  4. Medication instructions in preparation for your procedure:   Contrast Allergy: No  Take only 7.5 units of insulin the night before your procedure. Do not take any insulin on the day of the procedure.  Do not take Diabetes Med Glucophage (Metformin) on the day of the procedure and HOLD 48 HOURS  AFTER THE PROCEDURE.  On the morning of your procedure, take your Aspirin and Plavix/Clopidogrel and any morning medicines NOT listed above.  You may use sips of water.  5. Plan for one night stay--bring personal belongings. 6. Bring a current list of your medications and current insurance cards. 7. You MUST have a  responsible person to drive you home. 8. Someone MUST be with you the first 24 hours after you arrive home or your discharge will be delayed. 9. Please wear clothes that are easy to get on and off and wear slip-on shoes.  Thank you for allowing [place] to care for you!   -- [place] [facility] Cardiovascular services

## 2021-09-01 NOTE — H&P (View-Only) (Signed)
Cardiology Office Note:    Date:  09/01/2021   ID:  [name] [name], DOB [birth year], MRN [id number]  PCP:  Leighton Ruff, MD (Inactive)   [facility] Providers Cardiologist:  Sherren Mocha, MD     Referring MD: Leighton Ruff, MD   Chief Complaint  Patient presents with   Coronary Artery Disease    History of Present Illness:    [name] [name] is a 75 y.o. male with a hx of coronary and peripheral arterial disease, presenting for follow-up evaluation today.  He has undergone CABG and PCI procedures after bypass surgery.  His last PCI procedure in 2020 involved stenting of calcific lesion in the right PDA beyond the vein graft insertion site.  The patient underwent right common femoral endarterectomy earlier this year to treat severe common femoral artery stenosis and right leg claudication.  He comes in today for further evaluation of chest pain.  He works as a Corporate treasurer and he has been having more and more exertional angina.  He describes tightness in the chest that occurs with walking or physical exertion.  He occasionally has this at night especially when he lays in certain positions.  He has taken nitroglycerin with prompt relief of his symptoms.  There is no associated shortness of breath, diaphoresis, nausea, lightheadedness, or syncope.  Symptoms have been present for several months but have worsened over the last few weeks.  Past Medical History:  Diagnosis Date   Arrhythmia    Premature beats   Atherosclerosis of native arteries of the extremities with intermittent claudication    Coronary atherosclerosis of artery bypass graft    CABG 2009, LHC 07/2016 restenosis of SVG to OM treated with DES   Diabetes mellitus ([place])    Essential hypertension, benign    Fatty liver    History of kidney stones    Pure hypercholesterolemia     Past Surgical History:  Procedure Laterality Date   ABDOMINAL AORTOGRAM W/LOWER EXTREMITY N/A 12/30/2020   Procedure: ABDOMINAL  AORTOGRAM W/LOWER EXTREMITY;  Surgeon: Wellington Hampshire, MD;  Location: [place] [facility];  Service: Cardiovascular;  Laterality: N/A;   ADENOIDECTOMY     CARDIAC CATHETERIZATION N/A 07/18/2016   Procedure: Left Heart Cath and Cors/Grafts Angiography;  Surgeon: Burnell Blanks, MD;  Location: [place] [facility];  Service: Cardiovascular;  Laterality: N/A;   CARDIAC CATHETERIZATION N/A 07/18/2016   Procedure: Coronary Stent Intervention;  Surgeon: Burnell Blanks, MD;  Location: [place] [facility];  Service: Cardiovascular;  Laterality: N/A;   CHOLECYSTECTOMY     CORONARY ANGIOPLASTY WITH STENT PLACEMENT     CORONARY ARTERY BYPASS GRAFT     CORONARY STENT INTERVENTION N/A 01/11/2019   Procedure: CORONARY STENT INTERVENTION;  Surgeon: Sherren Mocha, MD;  Location: [place] [facility];  Service: Cardiovascular;  Laterality: N/A;   ENDARTERECTOMY FEMORAL Right 01/13/2021   Procedure: RIGHT FEMORAL ENDARTERECTOMY  WITH PATCH ANGIOPLASTY;  Surgeon: Rosetta Posner, MD;  Location: [place];  Service: Vascular;  Laterality: Right;   INGUINAL HERNIA REPAIR Right    LEFT HEART CATH AND CORS/GRAFTS ANGIOGRAPHY N/A 01/11/2019   Procedure: LEFT HEART CATH AND CORS/GRAFTS ANGIOGRAPHY;  Surgeon: Sherren Mocha, MD;  Location: [place] [facility];  Service: Cardiovascular;  Laterality: N/A;   Right femoral endarterectomy and Dacron patch angioplasty  01/13/3021   TONSILLECTOMY      Current Medications: Current Meds  Medication Sig   amLODipine (NORVASC) 10 MG tablet TAKE 1 TABLET BY MOUTH EVERY  DAY   aspirin 81 MG EC tablet TAKE 1 TABLET BY MOUTH EVERY DAY   benazepril (LOTENSIN) 40 MG tablet Take 40 mg by mouth daily.    clopidogrel (PLAVIX) 75 MG tablet Take 1 tablet (75 mg total) by mouth daily.   glucose blood (PRECISION QID TEST) test strip 1 each by Other route as directed.   hydrochlorothiazide (HYDRODIURIL) 25 MG tablet TAKE 1 TABLET BY MOUTH EVERY DAY   INSULIN NPH, HUMAN,, ISOPHANE, [place]  Inject 100 Int'l Units into the skin in the morning and at bedtime. Per patient taking 15 units   Insulin Pen Needle (B-D UF III MINI PEN NEEDLES) 31G X 5 MM MISC Use to inject Ozempic once a week   isosorbide mononitrate (IMDUR) 60 MG 24 hr tablet TAKE 1 TABLET BY MOUTH EVERY DAY   metFORMIN (GLUCOPHAGE) 1000 MG tablet Take 1,000 mg by mouth 2 (two) times daily with a meal.   metoprolol tartrate (LOPRESSOR) 50 MG tablet TAKE 1.5 TABLETS (75 MG TOTAL) BY MOUTH 2 (TWO) TIMES DAILY.   Multiple Vitamin (MULTIVITAMIN WITH MINERALS) TABS tablet Take 1 tablet by mouth daily.   nitroGLYCERIN (NITROSTAT) 0.4 MG SL tablet PLACE 1 TABLET (0.4 MG TOTAL) UNDER THE TONGUE EVERY 5 (FIVE) MINUTES AS NEEDED FOR CHEST PAIN.   rosuvastatin (CRESTOR) 20 MG tablet TAKE 1 TABLET BY MOUTH EVERY DAY     Allergies:   Empagliflozin   Social History   Socioeconomic History   Marital status: Married    Spouse name: Not on file   Number of children: Not on file   Years of education: Not on file   Highest education level: Not on file  Occupational History   Not on file  Tobacco Use   Smoking status: Never   Smokeless tobacco: Never  Vaping Use   Vaping Use: Never used  Substance and Sexual Activity   Alcohol use: No   Drug use: No   Sexual activity: Not on file  Other Topics Concern   Not on file  Social History Narrative   Not on file   Social Determinants of Health   Financial Resource Strain: Not on file  Food Insecurity: Not on file  Transportation Needs: Not on file  Physical Activity: Not on file  Stress: Not on file  Social Connections: Not on file     Family History: The patient's family history includes Heart disease in his mother. There is no history of Diabetes.  ROS:   Please see the history of present illness.    All other systems reviewed and are negative.  EKGs/Labs/Other Studies Reviewed:    The following studies were reviewed today: Cardiac Catheterization  01/11/2019: Conclusion    Ost LAD to Prox LAD lesion is 100% stenosed. Dist LAD lesion is 70% stenosed. Ost Cx to Prox Cx lesion is 99% stenosed. Prox RCA to Mid RCA lesion is 100% stenosed. Dist RCA lesion is 40% stenosed. SVG and is normal in caliber. SVG and is normal in caliber. SVG and is normal in caliber. LIMA and is normal in caliber. Previously placed Dist Graft to Insertion stent (unknown type) is widely patent. Ost RPDA to RPDA lesion is 95% stenosed. A drug-eluting stent was successfully placed using a STENT SYNERGY DES 2.25X16. Post intervention, there is a 0% residual stenosis.   1.  Severe native vessel coronary artery disease with known total occlusion of the LAD, left circumflex, and RCA 2.  Status post aortocoronary bypass surgery with continued patency of  the LIMA to LAD, continued patency of the saphenous vein graft to diagonal, continued patency of the saphenous vein graft and stented segment into OM 2, and continued patency of the saphenous vein graft to distal RCA with severe downstream stenosis involving the PDA origin 3.  Successful PCI of the right PDA using high-pressure balloon angioplasty followed by stenting with a 2.25 x 16 mm Synergy DES postdilated to high pressure with a 2.5 mm noncompliant balloon   Recommend: Same day discharge per protocol, continue aspirin and ticagrelor long-term  Echo 08/09/2021: 1. Left ventricular ejection fraction, by estimation, is 60 to 65%. The  left ventricle has normal function. The left ventricle has no regional  wall motion abnormalities. There is mild concentric left ventricular  hypertrophy. Left ventricular diastolic  parameters were normal.   2. Right ventricular systolic function is normal. The right ventricular  size is normal. Tricuspid regurgitation signal is inadequate for assessing  PA pressure.   3. The mitral valve is grossly normal. Trivial mitral valve  regurgitation. No evidence of mitral stenosis.    4. The aortic valve is tricuspid. There is moderate calcification of the  aortic valve. There is moderate thickening of the aortic valve. Aortic  valve regurgitation is not visualized. Moderate aortic valve stenosis.  Aortic valve area, by VTI measures  1.12 cm. Aortic valve mean gradient measures 20.0 mmHg. Aortic valve Vmax  measures 3.03 m/s.   5. The inferior vena cava is normal in size with greater than 50%  respiratory variability, suggesting right atrial pressure of 3 mmHg.   Comparison(s): No significant change from prior study.   EKG:  EKG is ordered today.  The ekg ordered today demonstrates normal sinus rhythm 60 bpm, occasional PVC, T wave abnormality consider inferolateral ischemia  Recent Labs: 01/11/2021: ALT 24 01/14/2021: BUN 17; Creatinine, Ser 1.21; Hemoglobin 12.8; Platelets 255; Potassium 3.7; Sodium 136  Recent Lipid Panel    Component Value Date/Time   CHOL 109 01/14/2021 0105   CHOL 129 04/27/2020 0824   TRIG 131 01/14/2021 0105   HDL 44 01/14/2021 0105   HDL 39 (L) 04/27/2020 0824   CHOLHDL 2.5 01/14/2021 0105   VLDL 26 01/14/2021 0105   LDLCALC 39 01/14/2021 0105   LDLCALC 63 04/27/2020 0824     Risk Assessment/Calculations:           Physical Exam:    VS:  BP 120/70   Pulse 60   Ht 5' 9"  (1.753 m)   Wt 200 lb 3.2 oz (90.8 kg)   SpO2 97%   BMI 29.56 kg/m     Wt Readings from Last 3 Encounters:  09/01/21 200 lb 3.2 oz (90.8 kg)  08/31/21 198 lb 12.8 oz (90.2 kg)  07/27/21 193 lb 9.6 oz (87.8 kg)     GEN:  Well nourished, well developed in no acute distress HEENT: Normal NECK: No JVD; No carotid bruits LYMPHATICS: No lymphadenopathy CARDIAC: RRR, 2/6 harsh mid peaking systolic murmur at the right upper sternal border RESPIRATORY:  Clear to auscultation without rales, wheezing or rhonchi  ABDOMEN: Soft, non-tender, non-distended MUSCULOSKELETAL:  No edema; No deformity  SKIN: Warm and dry NEUROLOGIC:  Alert and oriented x  3 PSYCHIATRIC:  Normal affect   ASSESSMENT:    1. Coronary artery disease involving native coronary artery of native heart with angina pectoris ([place])   2. Chest pain of uncertain etiology   3. Moderate aortic stenosis   4. Essential hypertension   5. Mixed hyperlipidemia  PLAN:    In order of problems listed above:  Patient with typical symptoms of worsening angina despite multiple antianginal drugs with maximally tolerated doses of amlodipine, isosorbide, and metoprolol tartrate.  He continues on high intensity statin drug as well as dual antiplatelet therapy.  I reviewed his previous cardiac catheterization films which demonstrate continued bypass graft patency and severe right PDA stenosis which was treated with stenting in 2020.  Considering his progressive, CCS functional class III symptoms, I have recommended cardiac catheterization and possible PTCA/stenting. I have reviewed the risks, indications, and alternatives to cardiac catheterization, possible angioplasty, and stenting with the patient. Risks include but are not limited to bleeding, infection, vascular injury, stroke, myocardial infection, arrhythmia, kidney injury, radiation-related injury in the case of prolonged fluoroscopy use, emergency cardiac surgery, and death. The patient understands the risks of serious complication is 1-2 in 8469 with diagnostic cardiac cath and 1-2% or less with angioplasty/stenting.   As above, symptoms consistent with typical angina Recent echo reviewed, moderately calcified and restricted aortic valve leaflets with a mean transvalvular gradient of 20 mmHg consistent with moderate aortic stenosis.  Continue annual echo surveillance. Blood pressure is controlled on multidrug regimen as above Treated with Zetia and rosuvastatin.  Last LDL cholesterol was 39 mg/dL.      Shared Decision Making/Informed Consent The risks [stroke (1 in 1000), death (1 in 1000), kidney failure [usually temporary] (1 in  500), bleeding (1 in 200), allergic reaction [possibly serious] (1 in 200)], benefits (diagnostic support and management of coronary artery disease) and alternatives of a cardiac catheterization were discussed in detail with [name] [name] and he is willing to proceed.    Medication Adjustments/Labs and Tests Ordered: Current medicines are reviewed at length with the patient today.  Concerns regarding medicines are outlined above.  Orders Placed This Encounter  Procedures   Basic metabolic panel   CBC   EKG 12-Lead    No orders of the defined types were placed in this encounter.   Patient Instructions  Medication Instructions:  Your physician recommends that you continue on your current medications as directed. Please refer to the Current Medication list given to you today.  *If you need a refill on your cardiac medications before your next appointment, please call your pharmacy*   Lab Work: BMET and CBC today  If you have labs (blood work) drawn today and your tests are completely normal, you will receive your results only by: [facility] (if you have MyChart) OR A paper copy in the mail If you have any lab test that is abnormal or we need to change your treatment, we will call you to review the results.   Testing/Procedures: Your physician has requested that you have a cardiac catheterization. Cardiac catheterization is used to diagnose and/or treat various heart conditions. Doctors may recommend this procedure for a number of different reasons. The most common reason is to evaluate chest pain. Chest pain can be a symptom of coronary artery disease (CAD), and cardiac catheterization can show whether plaque is narrowing or blocking your heart's arteries. This procedure is also used to evaluate the valves, as well as measure the blood flow and oxygen levels in different parts of your heart. For further information please visit [email or web address]. Please follow instruction sheet, as  given.   Follow-Up: At [facility], you and your health needs are our priority.  As part of our continuing mission to provide you with exceptional heart care, we have created designated Provider Care Teams.  These Care Teams include your primary Cardiologist (physician) and Advanced Practice Providers (APPs -  Physician Assistants and Nurse Practitioners) who all work together to provide you with the care you need, when you need it.  We recommend signing up for the patient portal called "MyChart".  Sign up information is provided on this After Visit Summary.  MyChart is used to connect with patients for Virtual Visits (Telemedicine).  Patients are able to view lab/test results, encounter notes, upcoming appointments, etc.  Non-urgent messages can be sent to your provider as well.   To learn more about what you can do with MyChart, go to [email or web address].    Your next appointment:   2-3 week(s)  The format for your next appointment:   In Person  Provider:   Sherren Mocha, MD     Other Instructions  [facility] OFFICE [place], [place] [address] Dept: [phone] Loc: [place]  09/01/2021  You are scheduled for a Cardiac Catheterization on Friday, November 25 with Dr. Sherren Mocha.  1. Please arrive at the [facility] (Main Entrance A) at [facility]: [address] [place],  [address] at 5:30 AM (This time is two hours before your procedure to ensure your preparation). Free valet parking service is available.   Special note: Every effort is made to have your procedure done on time. Please understand that emergencies sometimes delay scheduled procedures.  2. Diet: Do not eat solid foods after midnight.  The patient may have clear liquids until 5am upon the day of the procedure.  3. Labs: You will have labs drawn today.  4. Medication  instructions in preparation for your procedure:   Contrast Allergy: No  Take only 7.5 units of insulin the night before your procedure. Do not take any insulin on the day of the procedure.  Do not take Diabetes Med Glucophage (Metformin) on the day of the procedure and HOLD 48 HOURS AFTER THE PROCEDURE.  On the morning of your procedure, take your Aspirin and Plavix/Clopidogrel and any morning medicines NOT listed above.  You may use sips of water.  5. Plan for one night stay--bring personal belongings. 6. Bring a current list of your medications and current insurance cards. 7. You MUST have a responsible person to drive you home. 8. Someone MUST be with you the first 24 hours after you arrive home or your discharge will be delayed. 9. Please wear clothes that are easy to get on and off and wear slip-on shoes.  Thank you for allowing [place] to care for you!   -- [facility] Invasive Cardiovascular services     Signed, Sherren Mocha, MD  09/01/2021 6:03 PM    [place]

## 2021-09-02 LAB — CBC
Hematocrit: 41.4 % (ref 37.5–51.0)
Hemoglobin: 13.5 g/dL (ref 13.0–17.7)
MCH: 28.5 pg (ref 26.6–33.0)
MCHC: 32.6 g/dL (ref 31.5–35.7)
MCV: 87 fL (ref 79–97)
Platelets: 291 10*3/uL (ref 150–450)
RBC: 4.74 x10E6/uL (ref 4.14–5.80)
RDW: 13.5 % (ref 11.6–15.4)
WBC: 6.6 10*3/uL (ref 3.4–10.8)

## 2021-09-02 LAB — BASIC METABOLIC PANEL
BUN/Creatinine Ratio: 15 (ref 10–24)
BUN: 16 mg/dL (ref 8–27)
CO2: 23 mmol/L (ref 20–29)
Calcium: 10.2 mg/dL (ref 8.6–10.2)
Chloride: 104 mmol/L (ref 96–106)
Creatinine, Ser: 1.07 mg/dL (ref 0.76–1.27)
Glucose: 166 mg/dL — ABNORMAL HIGH (ref 70–99)
Potassium: 4.6 mmol/L (ref 3.5–5.2)
Sodium: 143 mmol/L (ref 134–144)
eGFR: 71 mL/min/{1.73_m2} (ref >59)

## 2021-09-08 ENCOUNTER — Telehealth: Payer: Self-pay

## 2021-09-08 NOTE — Telephone Encounter (Signed)
Called patient and reviewed the following pre-procedure instructions with the patient. Patient verbalized he understood and had no questions at this time.  Cardiac catheterization scheduled at [facility] for:07:30 am [facility] Main Entrance A [facility]) at:05:30 am  No solid food after midnight prior to cath, clear liquids until 5 AM day of procedure.  Hold medications:  Patient instructed to hold hydrochlorothiazide on the morning of the procedure.  Patient instructed to take his plavix dose the morning of the procedure.  Patient instructed to hold his insulin dose the morning of the procedure and to take 7.5 units of insulin the night before the procedure  Patient instructed to hold his metformin dose the day of the procedure and 48 hours post procedure.  Usual morning medications can be taken pre-cath with sips of water including aspirin 81 mg.    Confirmed patient has responsible adult to drive home post procedure and be with patient first 24 hours after arriving home.  [facility] does allow one visitor to accompany you and wait in the hospital waiting room while you are there for your procedure. You and your visitor will be asked to wear a mask once you enter the hospital.  Patient reports does not currently have any new symptoms concerning for COVID-19 and no household members with COVID-19 like illness.

## 2021-09-10 ENCOUNTER — Encounter (HOSPITAL_COMMUNITY): Payer: Self-pay | Admitting: Cardiovascular Disease

## 2021-09-10 ENCOUNTER — Telehealth: Payer: Self-pay | Admitting: Physician Assistant

## 2021-09-10 ENCOUNTER — Other Ambulatory Visit: Payer: Self-pay

## 2021-09-10 ENCOUNTER — Encounter (HOSPITAL_COMMUNITY)
Admission: RE | Disposition: A | Discharge status: Home / Self Care | Payer: Self-pay | Source: Home / Self Care | Attending: Cardiovascular Disease

## 2021-09-10 ENCOUNTER — Ambulatory Visit (HOSPITAL_COMMUNITY)
Admission: RE | Admit: 2021-09-10 | Discharge: 2021-09-10 | Disposition: A | Discharge status: Home / Self Care | Payer: Medicare Other | Attending: Cardiovascular Disease | Admitting: Cardiovascular Disease

## 2021-09-10 DIAGNOSIS — Z955 Presence of coronary angioplasty implant and graft: Secondary | ICD-10-CM | POA: Diagnosis not present

## 2021-09-10 DIAGNOSIS — Z794 Long term (current) use of insulin: Secondary | ICD-10-CM

## 2021-09-10 DIAGNOSIS — E119 Type 2 diabetes mellitus without complications: Secondary | ICD-10-CM

## 2021-09-10 DIAGNOSIS — I10 Essential (primary) hypertension: Secondary | ICD-10-CM | POA: Insufficient documentation

## 2021-09-10 DIAGNOSIS — I25110 Atherosclerotic heart disease of native coronary artery with unstable angina pectoris: Secondary | ICD-10-CM | POA: Insufficient documentation

## 2021-09-10 DIAGNOSIS — I25810 Atherosclerosis of coronary artery bypass graft(s) without angina pectoris: Secondary | ICD-10-CM | POA: Diagnosis present

## 2021-09-10 DIAGNOSIS — I25710 Atherosclerosis of autologous vein coronary artery bypass graft(s) with unstable angina pectoris: Secondary | ICD-10-CM

## 2021-09-10 DIAGNOSIS — E785 Hyperlipidemia, unspecified: Secondary | ICD-10-CM | POA: Diagnosis present

## 2021-09-10 DIAGNOSIS — I200 Unstable angina: Secondary | ICD-10-CM | POA: Diagnosis present

## 2021-09-10 DIAGNOSIS — Z951 Presence of aortocoronary bypass graft: Secondary | ICD-10-CM | POA: Insufficient documentation

## 2021-09-10 DIAGNOSIS — I350 Nonrheumatic aortic (valve) stenosis: Secondary | ICD-10-CM | POA: Diagnosis not present

## 2021-09-10 DIAGNOSIS — E1151 Type 2 diabetes mellitus with diabetic peripheral angiopathy without gangrene: Secondary | ICD-10-CM | POA: Diagnosis not present

## 2021-09-10 DIAGNOSIS — Z79899 Other long term (current) drug therapy: Secondary | ICD-10-CM | POA: Insufficient documentation

## 2021-09-10 DIAGNOSIS — E782 Mixed hyperlipidemia: Secondary | ICD-10-CM | POA: Insufficient documentation

## 2021-09-10 DIAGNOSIS — I2582 Chronic total occlusion of coronary artery: Secondary | ICD-10-CM | POA: Insufficient documentation

## 2021-09-10 HISTORY — PX: LEFT HEART CATH AND CORS/GRAFTS ANGIOGRAPHY: CATH118250

## 2021-09-10 HISTORY — PX: CORONARY BALLOON ANGIOPLASTY: CATH118233

## 2021-09-10 HISTORY — DX: Peripheral vascular disease, unspecified: I73.9

## 2021-09-10 HISTORY — DX: Nonrheumatic aortic (valve) stenosis: I35.0

## 2021-09-10 LAB — GLUCOSE, CAPILLARY
Glucose-Capillary: 200 mg/dL — ABNORMAL HIGH (ref 70–99)
Glucose-Capillary: 165 mg/dL — ABNORMAL HIGH (ref 70–99)

## 2021-09-10 LAB — POCT ACTIVATED CLOTTING TIME: Activated Clotting Time: 248 seconds

## 2021-09-10 SURGERY — LEFT HEART CATH AND CORS/GRAFTS ANGIOGRAPHY
Anesthesia: LOCAL

## 2021-09-10 MED ORDER — SODIUM CHLORIDE 0.9% FLUSH: 3.0000 mL | Freq: Two times a day (BID) | INTRAVENOUS | Status: DC

## 2021-09-10 MED ORDER — SODIUM CHLORIDE 0.9 % IV SOLN: 250.0000 mL | INTRAVENOUS | Status: DC | PRN

## 2021-09-10 MED ORDER — HEPARIN SODIUM (PORCINE) 1000 UNIT/ML IJ SOLN
INTRAMUSCULAR | Status: DC | PRN
  Administered 2021-09-10: 8000 [IU] via INTRAVENOUS
  Administered 2021-09-10: 3000 [IU] via INTRAVENOUS

## 2021-09-10 MED ORDER — VERAPAMIL HCL 2.5 MG/ML IV SOLN
INTRAVENOUS | Status: DC | PRN
  Administered 2021-09-10: 10 mL via INTRA_ARTERIAL

## 2021-09-10 MED ORDER — HEPARIN SODIUM (PORCINE) 1000 UNIT/ML IJ SOLN
INTRAMUSCULAR | Status: AC
  Filled 2021-09-10: qty 10

## 2021-09-10 MED ORDER — HYDRALAZINE HCL 20 MG/ML IJ SOLN: 10.0000 mg | INTRAMUSCULAR | Status: DC | PRN

## 2021-09-10 MED ORDER — LABETALOL HCL 5 MG/ML IV SOLN: 10.0000 mg | INTRAVENOUS | Status: DC | PRN

## 2021-09-10 MED ORDER — NITROGLYCERIN 1 MG/10 ML FOR IR/CATH LAB
INTRA_ARTERIAL | Status: DC | PRN
  Administered 2021-09-10: 200 ug via INTRACORONARY

## 2021-09-10 MED ORDER — MIDAZOLAM HCL 2 MG/2ML IJ SOLN
INTRAMUSCULAR | Status: DC | PRN
  Administered 2021-09-10: 2 mg via INTRAVENOUS
  Administered 2021-09-10: 1 mg via INTRAVENOUS

## 2021-09-10 MED ORDER — HEPARIN (PORCINE) IN NACL 1000-0.9 UT/500ML-% IV SOLN
INTRAVENOUS | Status: DC | PRN
  Administered 2021-09-10 (×2): 500 mL

## 2021-09-10 MED ORDER — ACETAMINOPHEN 325 MG PO TABS: 650.0000 mg | ORAL_TABLET | ORAL | Status: DC | PRN

## 2021-09-10 MED ORDER — MIDAZOLAM HCL 2 MG/2ML IJ SOLN
INTRAMUSCULAR | Status: AC
  Filled 2021-09-10: qty 2

## 2021-09-10 MED ORDER — LIDOCAINE HCL (PF) 1 % IJ SOLN
INTRAMUSCULAR | Status: AC
  Filled 2021-09-10: qty 30

## 2021-09-10 MED ORDER — FENTANYL CITRATE (PF) 100 MCG/2ML IJ SOLN
INTRAMUSCULAR | Status: AC
  Filled 2021-09-10: qty 2

## 2021-09-10 MED ORDER — FENTANYL CITRATE (PF) 100 MCG/2ML IJ SOLN
INTRAMUSCULAR | Status: DC | PRN
  Administered 2021-09-10 (×2): 25 ug via INTRAVENOUS

## 2021-09-10 MED ORDER — SODIUM CHLORIDE 0.9 % WEIGHT BASED INFUSION: 1.0000 mL/kg/h | INTRAVENOUS | Status: DC

## 2021-09-10 MED ORDER — SODIUM CHLORIDE 0.9% FLUSH: 3.0000 mL | INTRAVENOUS | Status: DC | PRN

## 2021-09-10 MED ORDER — LIDOCAINE HCL (PF) 1 % IJ SOLN
INTRAMUSCULAR | Status: DC | PRN
  Administered 2021-09-10: 2 mL

## 2021-09-10 MED ORDER — ONDANSETRON HCL 4 MG/2ML IJ SOLN: 4.0000 mg | Freq: Four times a day (QID) | INTRAMUSCULAR | Status: DC | PRN

## 2021-09-10 MED ORDER — ASPIRIN 81 MG PO CHEW: 81.0000 mg | CHEWABLE_TABLET | ORAL | Status: AC

## 2021-09-10 MED ORDER — SODIUM CHLORIDE 0.9 % WEIGHT BASED INFUSION
3.0000 mL/kg/h | INTRAVENOUS | Status: AC
  Administered 2021-09-10: 3 mL/kg/h via INTRAVENOUS

## 2021-09-10 MED ORDER — NITROGLYCERIN 1 MG/10 ML FOR IR/CATH LAB
INTRA_ARTERIAL | Status: AC
  Filled 2021-09-10: qty 10

## 2021-09-10 MED ORDER — HEPARIN (PORCINE) IN NACL 1000-0.9 UT/500ML-% IV SOLN
INTRAVENOUS | Status: AC
  Filled 2021-09-10: qty 1000

## 2021-09-10 MED ORDER — VERAPAMIL HCL 2.5 MG/ML IV SOLN
INTRAVENOUS | Status: AC
  Filled 2021-09-10: qty 2

## 2021-09-10 MED ORDER — CLOPIDOGREL BISULFATE 75 MG PO TABS: 75.0000 mg | ORAL_TABLET | ORAL | Status: AC

## 2021-09-10 SURGICAL SUPPLY — 19 items
BALLN ~~LOC~~ EUPHORA RX 2.5X15 (BALLOONS) ×2
BALLN ~~LOC~~ EUPHORA RX 2.5X8 (BALLOONS) ×2
BALLOON ~~LOC~~ EUPHORA RX 2.5X15 (BALLOONS) ×1 IMPLANT
BALLOON ~~LOC~~ EUPHORA RX 2.5X8 (BALLOONS) ×1 IMPLANT
CATH EXPO 5F MPA-1 (CATHETERS) ×2 IMPLANT
CATH INFINITI 5 FR AL2 (CATHETERS) ×2 IMPLANT
CATH INFINITI 5FR MULTPACK ANG (CATHETERS) ×2 IMPLANT
CATH LAUNCHER 6FR AL1 (CATHETERS) ×1 IMPLANT
CATHETER LAUNCHER 6FR AL1 (CATHETERS) ×2
GLIDESHEATH SLEND SS 6F .021 (SHEATH) ×2 IMPLANT
GUIDEWIRE INQWIRE 1.5J.035X260 (WIRE) ×1 IMPLANT
INQWIRE 1.5J .035X260CM (WIRE) ×2
KIT ENCORE 26 ADVANTAGE (KITS) ×2 IMPLANT
KIT HEART LEFT (KITS) ×2 IMPLANT
PACK CARDIAC CATHETERIZATION (CUSTOM PROCEDURE TRAY) ×2 IMPLANT
TRANSDUCER W/STOPCOCK (MISCELLANEOUS) ×2 IMPLANT
TUBING CIL FLEX 10 FLL-RA (TUBING) ×2 IMPLANT
WIRE EMERALD ST .035X150CM (WIRE) ×2 IMPLANT
WIRE RUNTHROUGH .014X180CM (WIRE) ×2 IMPLANT

## 2021-09-10 NOTE — Telephone Encounter (Addendum)
    Attention TOC pool,  This patient will need a TOC phone call after discharge. They are being discharged likely today same day PCI. Follow-up appointment has already been arranged with: Coletta Memos NP at [place] location on Friday Oct 01, 2021.  Although this will be outside the window for [facility] visit, still needs phone call as they are a same-day PCI discharge. Please also remind the patient again that his f/u is at [place] location.  They are a patient of Sherren Mocha, MD.  Thank you! Charlie Pitter, PA-C

## 2021-09-10 NOTE — Interval H&P Note (Signed)
Cath Lab Visit (complete for each Cath Lab visit)  Clinical Evaluation Leading to the Procedure:   ACS: No.  Non-ACS:    Anginal Classification: CCS III  Anti-ischemic medical therapy: Maximal Therapy (2 or more classes of medications)  Non-Invasive Test Results: No non-invasive testing performed  Prior CABG: Previous CABG      History and Physical Interval Note:  09/10/2021 8:57 AM  [name] [name]  has presented today for surgery, with the diagnosis of chest pain, CAD.  The various methods of treatment have been discussed with the patient and family. After consideration of risks, benefits and other options for treatment, the patient has consented to  Procedure(s): LEFT HEART CATH AND CORS/GRAFTS ANGIOGRAPHY (N/A) as a surgical intervention.  The patient's history has been reviewed, patient examined, no change in status, stable for surgery.  I have reviewed the patient's chart and labs.  Questions were answered to the patient's satisfaction.     Sherren Mocha

## 2021-09-10 NOTE — Discharge Summary (Signed)
Discharge Summary for Same Day PCI   Patient ID: MAJD TISSUE MRN: [id number]; DOB: [birth year]  Admit date: 09/10/2021 Discharge date: 09/10/2021  Primary Care Provider: Leighton Ruff, MD (Inactive)  Primary Cardiologist: Sherren Mocha, MD  Primary Electrophysiologist:  None   Discharge Diagnoses    Principal Problem:   Crescendo angina [facility]) Active Problems:   HLD (hyperlipidemia)   Essential hypertension   CAD, ARTERY BYPASS GRAFT   Aortic valvular stenosis   Insulin dependent type 2 diabetes mellitus [facility])    Diagnostic Studies/Procedures    Cardiac Catheterization 09/10/2021:    Ost LAD to Prox LAD lesion is 100% stenosed.   Dist LAD lesion is 70% stenosed.   Ost Cx to Prox Cx lesion is 99% stenosed.   Dist RCA lesion is 40% stenosed.   Prox RCA to Mid RCA lesion is 100% stenosed.   Ost RPDA to RPDA lesion is 40% stenosed.   Dist Graft to Insertion lesion is 75% stenosed.   Balloon angioplasty was performed using a BALLN [place] RX 2.5X15.   Post intervention, there is a 20% residual stenosis.   SVG and is normal in caliber.   SVG and is normal in caliber.   SVG and is normal in caliber.   LIMA and is normal in caliber.   There is moderate aortic valve stenosis.   There is no mitral valve stenosis.   1.  Severe native three-vessel coronary artery disease with total occlusion of the RCA and total occlusion of the left main. 2.  Status post aortocoronary bypass surgery with continued patency of the LIMA to LAD, saphenous vein graft to distal RCA, saphenous vein graft to diagonal, and saphenous vein graft to OM.  There is mild in-stent restenosis in the native PDA ostium estimated at 40%.  There is severe in-stent restenosis at the stented segment of the SVG to OM anastomosis, estimated at 75%. 3.  Successful balloon angioplasty of severe in-stent restenosis involving the SVG to obtuse marginal distal anastomosis, reducing 75% stenosis to 20%. 4.  Moderate  aortic stenosis with mean gradient 33 mmHg   Recommend: Same-day PCI protocol as long as no early complications arise.  Ongoing medical therapy and surveillance of aortic stenosis.  If persistent lifestyle limiting symptoms, will need to consider redo CABG/aortic valve replacement versus TAVR and ongoing medical therapy.  If significant improvement in symptoms, we will continue with our current management of medical therapy and surveillance of aortic stenosis.   _____________   History of Present Illness     [name] [name] is a 75 y.o. male with history of CAD s/p prior CABG/PCI, PAD, mild carotid artery disease (1-39% BICA 08/2021), DM, HTN, fatty liver, kidney stones, HLD, aortic stenosis who presented for planned cardiac cath today. He has undergone CABG and PCI procedures after bypass surgery.  His last PCI procedure in 2020 involved stenting of calcific lesion in the right PDA beyond the vein graft insertion site. The patient underwent right common femoral endarterectomy earlier this year to treat severe common femoral artery stenosis and right leg claudication.  Last 2D echo 08/09/21 showed EF 60-65%, moderate aortic stenosis, no significant change from prior study. He was recently seen in the office 09/01/21 for chest tightness felt concerning for unstable angina. Cardiac catheterization was arranged for further evaluation.  Hospital Course     The patient underwent cardiac cath as noted above with findings above with successful balloon angioplasty of severe in-stent restenosis involving the SVG to obtuse marginal  distal anastomosis. He was also noted to have moderate aortic stenosis.Dr. [name] recommended to continue DAPT with ASA/Plavix long term (beyond 12 months) because of Saphenous vein graft disease, multiple PCI procedures - he was on this prior to admission. Dr. [name] recommended ongoing medical therapy and surveillance of aortic stenosis.  If the patient develops persistent lifestyle  limiting symptoms, will need to consider redo CABG/aortic valve replacement versus TAVR and ongoing medical therapy.  If significant improvement in symptoms, would recommend to continue with current management of medical therapy and surveillance of aortic stenosis. Patient had recent refill of Plavix in 03/2021 for 1 year so refills not needed at this time.  The patient was not seen by cardiac rehab while in short stay due to the holiday, but Dr. [name] placed order for phase 2 referral. Post-cath the patient had small hematoma that resolved with holding pressure per nurse. This remains resolved at this time. He does have mild ecchymosis proximal to the cath site but this is soft. Otherwise there were no observed complications post cath. Radial cath site was re-evaluated by Dr. [name] prior to discharge and found to be stable without any new complications. Instructions/precautions regarding cath site care were given prior to discharge.  [name] [name] was seen by Dr. [name] and determined stable for discharge home. Follow up with our office has been arranged. Medications are listed below. Dr. [name] reconciled his medication list at discharge. Pertinent changes will include instructions to hold metformin for 2 days post-cath and restart on 09/13/21.  _____________  Cath/PCI Registry Performance & Quality Measures: Aspirin prescribed? - Yes ADP Receptor Inhibitor (Plavix/Clopidogrel, Brilinta/Ticagrelor or Effient/Prasugrel) prescribed (includes medically managed patients)? - Yes High Intensity Statin (Lipitor 40-86m or Crestor 20-462m prescribed? - Yes For EF <40%, was ACEI/ARB prescribed? - Not Applicable (EF >/= 4041%For EF <40%, Aldosterone Antagonist (Spironolactone or Eplerenone) prescribed? - Not Applicable (EF >/= 4058%Cardiac Rehab Phase II ordered (Included Medically managed Patients)? - Yes  _____________   Discharge Vitals Blood pressure (!) 157/62, pulse 78, temperature 98.3 F (36.8  C), temperature source Oral, resp. rate (!) 0, height 5' 9"  (1.753 m), weight 88 kg, SpO2 97 %.  Filed Weights   09/10/21 0554  Weight: 88 kg    Last Labs & Radiologic Studies   Prior to admission labs:   Lab Results  Component Value Date   NA 143 09/01/2021   K 4.6 09/01/2021   CO2 23 09/01/2021   GLUCOSE 166 (H) 09/01/2021   BUN 16 09/01/2021   CREATININE 1.07 09/01/2021   CALCIUM 10.2 09/01/2021   EGFR 71 09/01/2021   GFRNONAA >60 01/14/2021   Lab Results  Component Value Date   WBC 6.6 09/01/2021   HGB 13.5 09/01/2021   HCT 41.4 09/01/2021   MCV 87 09/01/2021   PLT 291 09/01/2021    CARDIAC CATHETERIZATION  Addendum Date: 09/10/2021     Ost LAD to Prox LAD lesion is 100% stenosed.   Dist LAD lesion is 70% stenosed.   Ost Cx to Prox Cx lesion is 99% stenosed.   Dist RCA lesion is 40% stenosed.   Prox RCA to Mid RCA lesion is 100% stenosed.   Ost RPDA to RPDA lesion is 40% stenosed.   Dist Graft to Insertion lesion is 75% stenosed.   Balloon angioplasty was performed using a BALLN [place] 2.5X15.   Post intervention, there is a 20% residual stenosis.   SVG and is normal in caliber.   SVG  and is normal in caliber.   SVG and is normal in caliber.   LIMA and is normal in caliber.   There is moderate aortic valve stenosis.   There is no mitral valve stenosis. 1.  Severe native three-vessel coronary artery disease with total occlusion of the RCA and total occlusion of the left main. 2.  Status post aortocoronary bypass surgery with continued patency of the LIMA to LAD, saphenous vein graft to distal RCA, saphenous vein graft to diagonal, and saphenous vein graft to OM.  There is mild in-stent restenosis in the native PDA ostium estimated at 40%.  There is severe in-stent restenosis at the stented segment of the SVG to OM anastomosis, estimated at 75%. 3.  Successful balloon angioplasty of severe in-stent restenosis involving the SVG to obtuse marginal distal anastomosis,  reducing 75% stenosis to 20%. 4.  Moderate aortic stenosis with mean gradient 33 mmHg Recommend: Same-day PCI protocol as long as no early complications arise.  Ongoing medical therapy and surveillance of aortic stenosis.  If persistent lifestyle limiting symptoms, will need to consider redo CABG/aortic valve replacement versus TAVR and ongoing medical therapy.  If significant improvement in symptoms, we will continue with our current management of medical therapy and surveillance of aortic stenosis.  Result Date: 09/10/2021   Ost LAD to Prox LAD lesion is 100% stenosed.   Dist LAD lesion is 70% stenosed.   Ost Cx to Prox Cx lesion is 99% stenosed.   Dist RCA lesion is 40% stenosed.   Prox RCA to Mid RCA lesion is 100% stenosed.   Ost RPDA to RPDA lesion is 40% stenosed.   Dist Graft to Insertion lesion is 75% stenosed.   Balloon angioplasty was performed using a BALLN [place] RX 2.5X15.   Post intervention, there is a 20% residual stenosis.   SVG and is normal in caliber.   SVG and is normal in caliber.   SVG and is normal in caliber.   LIMA and is normal in caliber.   There is moderate aortic valve stenosis.   There is moderate mitral valve stenosis. 1.  Severe native three-vessel coronary artery disease with total occlusion of the RCA and total occlusion of the left main. 2.  Status post aortocoronary bypass surgery with continued patency of the LIMA to LAD, saphenous vein graft to distal RCA, saphenous vein graft to diagonal, and saphenous vein graft to OM.  There is mild in-stent restenosis in the native PDA ostium estimated at 40%.  There is severe in-stent restenosis at the stented segment of the SVG to OM anastomosis, estimated at 75%. 3.  Successful balloon angioplasty of severe in-stent restenosis involving the SVG to obtuse marginal distal anastomosis, reducing 75% stenosis to 20%. 4.  Moderate aortic stenosis with mean gradient 33 mmHg Recommend: Same-day PCI protocol as long as no early  complications arise.  Ongoing medical therapy and surveillance of aortic stenosis.  If persistent lifestyle limiting symptoms, will need to consider redo CABG/aortic valve replacement versus TAVR and ongoing medical therapy.  If significant improvement in symptoms, we will continue with our current management of medical therapy and surveillance of aortic stenosis.   VAS US CAROTID  Result Date: 08/31/2021 Carotid Arterial Duplex Study Patient Name:  [name] [name]  Date of Exam:   08/31/2021 Medical Rec #: [id number]      Accession #:    [id number] Date of Birth: 19-Jun-1946      Patient Gender: M Patient Age:   55 years  Exam Location:  [facility] Procedure:      VAS US CAROTID Referring Phys: Monica Martinez --------------------------------------------------------------------------------  Indications:      Carotid artery disease. Risk Factors:     Hypertension, hyperlipidemia, Diabetes, coronary artery                   disease. Comparison Study: 05/26/2020: Rt ICA 1-39%; Lt ICA 1-39%. Performing Technologist: Ivan Croft  Examination Guidelines: A complete evaluation includes B-mode imaging, spectral Doppler, color Doppler, and power Doppler as needed of all accessible portions of each vessel. Bilateral testing is considered an integral part of a complete examination. Limited examinations for reoccurring indications may be performed as noted.  Right Carotid Findings: +----------+--------+--------+--------+--------------------+-------------------+           PSV cm/sEDV cm/sStenosisPlaque Description  Comments            +----------+--------+--------+--------+--------------------+-------------------+ CCA Prox  83      10                                                      +----------+--------+--------+--------+--------------------+-------------------+ CCA Mid   91      14                                                       +----------+--------+--------+--------+--------------------+-------------------+ CCA Distal123     17              heterogenous and    bifurcation                                           calcific            velocity 241/26                                                           cm/s                +----------+--------+--------+--------+--------------------+-------------------+ ICA Prox  124     20      1-39%   heterogenous and    Shadowing                                             calcific                                +----------+--------+--------+--------+--------------------+-------------------+ ICA Mid   112     16                                                      +----------+--------+--------+--------+--------------------+-------------------+  ICA Distal104     10                                                      +----------+--------+--------+--------+--------------------+-------------------+ ECA       162     11              heterogenous and                                                          calcific                                +----------+--------+--------+--------+--------------------+-------------------+ +----------+--------+-------+----------------+-------------------+           PSV cm/sEDV cmsDescribe        Arm Pressure (mmHG) +----------+--------+-------+----------------+-------------------+ [id number]            Multiphasic, WNL                    +----------+--------+-------+----------------+-------------------+ +---------+--------+--+--------+-+---------+ VertebralPSV cm/s57EDV cm/s7Antegrade +---------+--------+--+--------+-+---------+  Left Carotid Findings: +----------+--------+--------+--------+-------------------------+---------+           PSV cm/sEDV cm/sStenosisPlaque Description       Comments  +----------+--------+--------+--------+-------------------------+---------+ CCA Prox   139     13                                                 +----------+--------+--------+--------+-------------------------+---------+ CCA Mid   115     16                                                 +----------+--------+--------+--------+-------------------------+---------+ CCA Distal78      14              heterogenous                       +----------+--------+--------+--------+-------------------------+---------+ ICA Prox  127     20      1-39%   heterogenous and calcificShadowing +----------+--------+--------+--------+-------------------------+---------+ ICA Mid   113     19                                                 +----------+--------+--------+--------+-------------------------+---------+ ICA Distal128     20                                                 +----------+--------+--------+--------+-------------------------+---------+ ECA       111     10              heterogenous and calcific          +----------+--------+--------+--------+-------------------------+---------+ +----------+--------+--------+----------------+-------------------+  PSV cm/sEDV cm/sDescribe        Arm Pressure (mmHG) +----------+--------+--------+----------------+-------------------+ [id number]             Multiphasic, WNL                    +----------+--------+--------+----------------+-------------------+ +---------+--------+--+--------+--+---------+ VertebralPSV cm/s70EDV cm/s12Antegrade +---------+--------+--+--------+--+---------+   Summary: Right Carotid: Velocities in the right ICA are consistent with a 1-39% stenosis.                Bifurcation velocity 241/26 cm/s. Left Carotid: Velocities in the left ICA are consistent with a 1-39% stenosis. Vertebrals:  Bilateral vertebral arteries demonstrate antegrade flow. Subclavians: Normal flow hemodynamics were seen in bilateral subclavian              arteries. *See table(s) above for  measurements and observations.  Electronically signed by Monica Martinez MD on 08/31/2021 at 1:38:26 PM.    Final     Disposition   Pt is being discharged home today in good condition.  Follow-up Plans & Appointments     Follow-up Information     Deberah Pelton, NP Follow up.   Specialty: Cardiology Why: [place] - note this is the [place] location - for post-cath follow-up on Friday Oct 01, 2021 at 2:20 PM (Arrive by 2:05 PM). Denyse Amass is one of our nurse practitioners that works with Dr. [name] and our [place] team. Contact information: [address] [place] [address] [phone]                Discharge Instructions     AMB Referral to Cardiac Rehabilitation - Phase II   Complete by: As directed    Diagnosis: Coronary Stents   After initial evaluation and assessments completed: Virtual Based Care may be provided alone or in conjunction with Phase 2 Cardiac Rehab based on patient barriers.: Yes   Diet - low sodium heart healthy   Complete by: As directed    Discharge instructions   Complete by: As directed    IMPORTANT: do not take any metformin for the 2 days following your cardiac cath. You can restart this 09/13/21.   Increase activity slowly   Complete by: As directed    No driving for 3 days. No lifting over 5 lbs for 1 week. No sexual activity for 1 week. Keep procedure site clean & dry. If you notice increased pain, swelling, bleeding or pus, call/return!  You may shower, but no soaking baths/hot tubs/pools for 1 week.        Discharge Medications   Allergies as of 09/10/2021       Reactions   Empagliflozin Other (See Comments)   Abdominal pain/dizziness        Medication List     TAKE these medications    amLODipine 10 MG tablet Commonly known as: NORVASC TAKE 1 TABLET BY MOUTH EVERY DAY   aspirin 81 MG EC tablet TAKE 1 TABLET BY MOUTH EVERY DAY   B-D UF III MINI PEN NEEDLES 31G X 5 MM  Misc Generic drug: Insulin Pen Needle Use to inject Ozempic once a week   benazepril 40 MG tablet Commonly known as: LOTENSIN Take 40 mg by mouth daily.   clopidogrel 75 MG tablet Commonly known as: PLAVIX Take 1 tablet (75 mg total) by mouth daily.   ezetimibe 10 MG tablet Commonly known as: ZETIA Take 1 tablet (10 mg total) by mouth daily.   hydrochlorothiazide 25 MG tablet Commonly known as: HYDRODIURIL  TAKE 1 TABLET BY MOUTH EVERY DAY   insulin NPH Human 100 UNIT/ML injection Commonly known as: NOVOLIN N Inject 15 Units into the skin 2 (two) times daily before a meal.   isosorbide mononitrate 60 MG 24 hr tablet Commonly known as: IMDUR TAKE 1 TABLET BY MOUTH EVERY DAY   metFORMIN 1000 MG tablet Commonly known as: GLUCOPHAGE Take 1,000 mg by mouth 2 (two) times daily with a meal. Notes to patient: IMPORTANT: do not take any metformin for the 2 days following your cardiac cath. You can restart this 09/13/21.   metoprolol tartrate 50 MG tablet Commonly known as: LOPRESSOR TAKE 1.5 TABLETS (75 MG TOTAL) BY MOUTH 2 (TWO) TIMES DAILY.   multivitamin with minerals Tabs tablet Take 1 tablet by mouth daily.   nitroGLYCERIN 0.4 MG SL tablet Commonly known as: NITROSTAT PLACE 1 TABLET (0.4 MG TOTAL) UNDER THE TONGUE EVERY 5 (FIVE) MINUTES AS NEEDED FOR CHEST PAIN.   Precision QID Test test strip Generic drug: glucose blood 1 each by Other route as directed.   rosuvastatin 20 MG tablet Commonly known as: CRESTOR TAKE 1 TABLET BY MOUTH EVERY DAY           Allergies Allergies  Allergen Reactions   Empagliflozin Other (See Comments)    Abdominal pain/dizziness    Outstanding Labs/Studies   N/A  Duration of Discharge Encounter   Greater than 30 minutes including physician time.  Signed, Charlie Pitter, PA-C 09/10/2021, 4:00 PM

## 2021-09-13 ENCOUNTER — Telehealth (HOSPITAL_COMMUNITY): Payer: Self-pay | Admitting: *Deleted

## 2021-09-13 NOTE — Telephone Encounter (Signed)
Patient contacted regarding discharge from [facility] on 11/25.  Patient understands to follow up with provider Coletta Memos on 10/01/2021 at 2:20 PM at [facility]. Patient understands discharge instructions? yes Patient understands medications and regiment? yes Patient understands to bring all medications to this visit? yes

## 2021-09-13 NOTE — Telephone Encounter (Signed)
Called pt to review cardiac education. Pt sts his radial site is healing well. Discussed Plavix importance, walking/exercise, NTG, and CRPII. Pt is well versed on caring for his heart and is not interested in CRPII however a referral was placed for [place] last week. Will send pt diets and exercise guidelines in the mail.  [place], ACSM 11:30 AM 09/13/2021

## 2021-09-13 NOTE — Telephone Encounter (Signed)
**Note De-Identified Andrya Roppolo Obfuscation** The pt has a hospital f/u scheduled with Coletta Memos, NP on 12/16 at 2:20 at our [facility] office.  Forwarding to [facility] pool.

## 2021-09-22 DIAGNOSIS — Z1389 Encounter for screening for other disorder: Secondary | ICD-10-CM | POA: Diagnosis not present

## 2021-09-22 DIAGNOSIS — Z0000 Encounter for general adult medical examination without abnormal findings: Secondary | ICD-10-CM | POA: Diagnosis not present

## 2021-09-24 ENCOUNTER — Other Ambulatory Visit: Payer: Self-pay | Admitting: Cardiovascular Disease

## 2021-09-29 NOTE — Progress Notes (Signed)
Cardiology Clinic Note   Patient Name: [name] [name] Date of Encounter: 10/01/2021  Primary Care Provider:  Leighton Ruff, MD (Inactive) Primary Cardiologist:  Sherren Mocha, MD  Patient Profile    [name] [name] 75 year old male presents the clinic today for follow-up evaluation of his coronary artery disease and essential hypertension  Past Medical History    Past Medical History:  Diagnosis Date   Aortic stenosis    Arrhythmia    Premature beats   Coronary atherosclerosis of artery bypass graft    CABG 2009, [place] 07/2016 restenosis of SVG to OM treated with DES   Diabetes mellitus ([place])    Essential hypertension, benign    Fatty liver    History of kidney stones    PAD (peripheral artery disease) ([place])    Pure hypercholesterolemia    Past Surgical History:  Procedure Laterality Date   ABDOMINAL AORTOGRAM W/LOWER EXTREMITY N/A 12/30/2020   Procedure: ABDOMINAL AORTOGRAM W/LOWER EXTREMITY;  Surgeon: Wellington Hampshire, MD;  Location: [facility];  Service: Cardiovascular;  Laterality: N/A;   ADENOIDECTOMY     CARDIAC CATHETERIZATION N/A 07/18/2016   Procedure: Left Heart Cath and Cors/Grafts Angiography;  Surgeon: Burnell Blanks, MD;  Location: [facility];  Service: Cardiovascular;  Laterality: N/A;   CARDIAC CATHETERIZATION N/A 07/18/2016   Procedure: Coronary Stent Intervention;  Surgeon: Burnell Blanks, MD;  Location: [facility];  Service: Cardiovascular;  Laterality: N/A;   CHOLECYSTECTOMY     CORONARY ANGIOPLASTY WITH STENT PLACEMENT     CORONARY ARTERY BYPASS GRAFT     CORONARY BALLOON ANGIOPLASTY N/A 09/10/2021   Procedure: CORONARY BALLOON ANGIOPLASTY;  Surgeon: Sherren Mocha, MD;  Location: [facility];  Service: Cardiovascular;  Laterality: N/A;   CORONARY STENT INTERVENTION N/A 01/11/2019   Procedure: CORONARY STENT INTERVENTION;  Surgeon: Sherren Mocha, MD;  Location: [facility];  Service:  Cardiovascular;  Laterality: N/A;   ENDARTERECTOMY FEMORAL Right 01/13/2021   Procedure: RIGHT FEMORAL ENDARTERECTOMY  WITH PATCH ANGIOPLASTY;  Surgeon: Rosetta Posner, MD;  Location: [place] [facility];  Service: Vascular;  Laterality: Right;   INGUINAL HERNIA REPAIR Right    LEFT HEART CATH AND CORS/GRAFTS ANGIOGRAPHY N/A 01/11/2019   Procedure: LEFT HEART CATH AND CORS/GRAFTS ANGIOGRAPHY;  Surgeon: Sherren Mocha, MD;  Location: [facility];  Service: Cardiovascular;  Laterality: N/A;   LEFT HEART CATH AND CORS/GRAFTS ANGIOGRAPHY N/A 09/10/2021   Procedure: LEFT HEART CATH AND CORS/GRAFTS ANGIOGRAPHY;  Surgeon: Sherren Mocha, MD;  Location: [facility];  Service: Cardiovascular;  Laterality: N/A;   Right femoral endarterectomy and Dacron patch angioplasty  01/13/3021   TONSILLECTOMY      Allergies  Allergies  Allergen Reactions   Empagliflozin Other (See Comments)    Abdominal pain/dizziness    History of Present Illness    [name] [name] is a PMH of coronary artery disease status post CABG/PCI, PAD, carotid artery disease (1-39% bilateral ICA stenosis 11/22), HTN, diabetes, fatty liver, renal calculi, HLD, and aortic stenosis.  He presented to the hospital 09/10/2021 for planned cardiac catheterization.  He had previously underwent CABG and PCI after his bypass surgery.  His last percutaneous coronary mention was in 2020 which involved stenting of the calcified lesions in the right PDA beyond the vein graft.  He underwent right common femoral endarterectomy early 2022 to treat severe common femoral artery stenosis and right leg claudication.  He has echocardiogram 08/09/2021 showed an EF of  60-65%, moderate aortic stenosis, and no significant changes from his previous study.  He was seen in the office 09/01/2021 and noted chest tightness.  There was concern for unstable angina.  Cardiac catheterization was ordered for further evaluation.  He underwent cardiac catheterization on  09/10/2021 which showed severe native three-vessel coronary artery disease with total occlusion of his RCA and left main.  He was noted to have continued patency of his LIMA-LAD, SVG-distal RCA, SVG-diagonal, SVG-OM.  He had mild in-stent restenosis of his native PDA ostium of 40%.  He was noted to have severe in-stent restenosis of the stented segment of the SVG-OM estimated at 75%.  He received successful balloon angioplasty of severe in-stent restenosis involving the SVG-OM which reduced the stenosis from 75% to 20%.  He was noted to have moderate aortic stenosis with mean gradient of 33 mmHg.  Ongoing medical management and surveillance for his aortic stenosis was recommended.  He presents the clinic today for follow-up evaluation states he notices chest discomfort with prolonged walking.  He continues to stay physically active doing yard work.  With short activities such as lifting objects or brief bouts of physical activity he does not notice any chest discomfort.  He reports that he has taken nitroglycerin 4-5 times since having his angioplasty.  He notices relief after his nitroglycerin.  We reviewed his medication list and I will increase his Imdur.  He does not notice chest discomfort or pressure at rest.  He reports that the chest discomfort that he does have is less than what he had prior to his angioplasty.  His left radial cath site is clean dry intact and has no signs of infection.  We reviewed his previous lipid panel.  We will plan follow-up in 1-2 months.  Today he denies chest pain, shortness of breath, lower extremity edema, fatigue, palpitations, melena, hematuria, hemoptysis, diaphoresis, weakness, presyncope, syncope, orthopnea, and PND.   Home Medications    Prior to Admission medications   Medication Sig Start Date End Date Taking? Authorizing Provider  amLODipine (NORVASC) 10 MG tablet TAKE 1 TABLET BY MOUTH EVERY DAY 05/21/21   Sherren Mocha, MD  aspirin 81 MG EC tablet TAKE 1  TABLET BY MOUTH EVERY DAY 02/23/21   Sherren Mocha, MD  benazepril (LOTENSIN) 40 MG tablet Take 40 mg by mouth daily.  04/30/12   [provider]  clopidogrel (PLAVIX) 75 MG tablet Take 1 tablet (75 mg total) by mouth daily. 04/13/21   Sherren Mocha, MD  ezetimibe (ZETIA) 10 MG tablet Take 1 tablet (10 mg total) by mouth daily. 01/24/20 01/18/21  Verta Ellen., NP  glucose blood (PRECISION QID TEST) test strip 1 each by Other route as directed. 08/03/20   [provider]  hydrochlorothiazide (HYDRODIURIL) 25 MG tablet TAKE 1 TABLET BY MOUTH EVERY DAY 09/24/21   Sherren Mocha, MD  insulin NPH Human (NOVOLIN N) 100 UNIT/ML injection Inject 15 Units into the skin 2 (two) times daily before a meal.    [provider]  Insulin Pen Needle (B-D UF III MINI PEN NEEDLES) 31G X 5 MM MISC Use to inject Ozempic once a week 01/02/21   [provider]  isosorbide mononitrate (IMDUR) 60 MG 24 hr tablet TAKE 1 TABLET BY MOUTH EVERY DAY 03/01/21   Sherren Mocha, MD  metFORMIN (GLUCOPHAGE) 1000 MG tablet Take 1,000 mg by mouth 2 (two) times daily with a meal. 05/21/19   [provider]  metoprolol tartrate (LOPRESSOR) 50 MG tablet  TAKE 1.5 TABLETS (75 MG TOTAL) BY MOUTH 2 (TWO) TIMES DAILY. 01/19/21   Sherren Mocha, MD  Multiple Vitamin (MULTIVITAMIN WITH MINERALS) TABS tablet Take 1 tablet by mouth daily.    [provider]  nitroGLYCERIN (NITROSTAT) 0.4 MG SL tablet PLACE 1 TABLET (0.4 MG TOTAL) UNDER THE TONGUE EVERY 5 (FIVE) MINUTES AS NEEDED FOR CHEST PAIN. 01/12/21   Sherren Mocha, MD  rosuvastatin (CRESTOR) 20 MG tablet TAKE 1 TABLET BY MOUTH EVERY DAY 04/08/21   Sherren Mocha, MD    Family History    Family History  Problem Relation Age of Onset   Heart disease Mother        No details   Diabetes Neg Hx    He indicated that his mother is deceased. He indicated that his father is deceased. He indicated that his maternal grandmother is deceased.  He indicated that his maternal grandfather is deceased. He indicated that his paternal grandmother is deceased. He indicated that his paternal grandfather is deceased. He indicated that the status of his neg hx is unknown.  Social History    Social History   Socioeconomic History   Marital status: Married    Spouse name: Not on file   Number of children: Not on file   Years of education: Not on file   Highest education level: Not on file  Occupational History   Not on file  Tobacco Use   Smoking status: Never   Smokeless tobacco: Never  Vaping Use   Vaping Use: Never used  Substance and Sexual Activity   Alcohol use: No   Drug use: No   Sexual activity: Not on file  Other Topics Concern   Not on file  Social History Narrative   Not on file   Social Determinants of Health   Financial Resource Strain: Not on file  Food Insecurity: Not on file  Transportation Needs: Not on file  Physical Activity: Not on file  Stress: Not on file  Social Connections: Not on file  Intimate Partner Violence: Not on file     Review of Systems    General:  No chills, fever, night sweats or weight changes.  Cardiovascular:  No chest pain, dyspnea on exertion, edema, orthopnea, palpitations, paroxysmal nocturnal dyspnea. Dermatological: No rash, lesions/masses Respiratory: No cough, dyspnea Urologic: No hematuria, dysuria Abdominal:   No nausea, vomiting, diarrhea, bright red blood per rectum, melena, or hematemesis Neurologic:  No visual changes, wkns, changes in mental status. All other systems reviewed and are otherwise negative except as noted above.  Physical Exam    VS:  BP 124/60    Pulse 68    Ht 5' 9"  (1.753 m)    Wt 204 lb (92.5 kg)    SpO2 96%    BMI 30.13 kg/m  , BMI Body mass index is 30.13 kg/m. GEN: Well nourished, well developed, in no acute distress. HEENT: normal. Neck: Supple, no JVD, carotid bruits, or masses. Cardiac: RRR, no murmurs, rubs, or gallops. No  clubbing, cyanosis, edema.  Radials/DP/PT 2+ and equal bilaterally.  Respiratory:  Respirations regular and unlabored, clear to auscultation bilaterally. GI: Soft, nontender, nondistended, BS + x 4. MS: no deformity or atrophy. Skin: warm and dry, no rash.  Neuro. Neuro:  Strength and sensation are intact. Psych: Normal affect.  Accessory Clinical Findings    Recent Labs: 01/11/2021: ALT 24 09/01/2021: BUN 16; Creatinine, Ser 1.07; Hemoglobin 13.5; Platelets 291; Potassium 4.6; Sodium 143   Recent Lipid Panel  Component Value Date/Time   CHOL 109 01/14/2021 0105   CHOL 129 04/27/2020 0824   TRIG 131 01/14/2021 0105   HDL 44 01/14/2021 0105   HDL 39 (L) 04/27/2020 0824   CHOLHDL 2.5 01/14/2021 0105   VLDL 26 01/14/2021 0105   LDLCALC 39 01/14/2021 0105   LDLCALC 63 04/27/2020 0824    ECG personally reviewed by me today-none today.  Echocardiogram 08/09/2021 IMPRESSIONS     1. Left ventricular ejection fraction, by estimation, is 60 to 65%. The  left ventricle has normal function. The left ventricle has no regional  wall motion abnormalities. There is mild concentric left ventricular  hypertrophy. Left ventricular diastolic  parameters were normal.   2. Right ventricular systolic function is normal. The right ventricular  size is normal. Tricuspid regurgitation signal is inadequate for assessing  PA pressure.   3. The mitral valve is grossly normal. Trivial mitral valve  regurgitation. No evidence of mitral stenosis.   4. The aortic valve is tricuspid. There is moderate calcification of the  aortic valve. There is moderate thickening of the aortic valve. Aortic  valve regurgitation is not visualized. Moderate aortic valve stenosis.  Aortic valve area, by VTI measures  1.12 cm. Aortic valve mean gradient measures 20.0 mmHg. Aortic valve Vmax  measures 3.03 m/s.   5. The inferior vena cava is normal in size with greater than 50%  respiratory variability, suggesting  right atrial pressure of 3 mmHg.   Comparison(s): No significant change from prior study.   Cardiac catheterization 09/10/2021   Ost LAD to Prox LAD lesion is 100% stenosed.   Dist LAD lesion is 70% stenosed.   Ost Cx to Prox Cx lesion is 99% stenosed.   Dist RCA lesion is 40% stenosed.   Prox RCA to Mid RCA lesion is 100% stenosed.   Ost RPDA to RPDA lesion is 40% stenosed.   Dist Graft to Insertion lesion is 75% stenosed.   Balloon angioplasty was performed using a BALLN [place] RX 2.5X15.   Post intervention, there is a 20% residual stenosis.   SVG and is normal in caliber.   SVG and is normal in caliber.   SVG and is normal in caliber.   LIMA and is normal in caliber.   There is moderate aortic valve stenosis.   There is no mitral valve stenosis.   1.  Severe native three-vessel coronary artery disease with total occlusion of the RCA and total occlusion of the left main. 2.  Status post aortocoronary bypass surgery with continued patency of the LIMA to LAD, saphenous vein graft to distal RCA, saphenous vein graft to diagonal, and saphenous vein graft to OM.  There is mild in-stent restenosis in the native PDA ostium estimated at 40%.  There is severe in-stent restenosis at the stented segment of the SVG to OM anastomosis, estimated at 75%. 3.  Successful balloon angioplasty of severe in-stent restenosis involving the SVG to obtuse marginal distal anastomosis, reducing 75% stenosis to 20%. 4.  Moderate aortic stenosis with mean gradient 33 mmHg   Recommend: Same-day PCI protocol as long as no early complications arise.  Ongoing medical therapy and surveillance of aortic stenosis.  If persistent lifestyle limiting symptoms, will need to consider redo CABG/aortic valve replacement versus TAVR and ongoing medical therapy.  If significant improvement in symptoms, we will continue with our current management of medical therapy and surveillance of aortic stenosis.  Diagnostic Dominance:  Right Intervention    Assessment & Plan  1.  Coronary artery disease-no chest pain today.  Does notice chest discomfort with extended periods of walking.  Denies chest discomfort with short bouts of increased physical activity.  Underwent cardiac catheterization 09/10/2021.  He was noted to have in-stent restenosis of his SVG-OM graft.  He received successful balloon angioplasty which reduced the stenosis to 20%.  Details above. Continue amlodipine, aspirin, clopidogrel, benazepril, ezetimibe, HCTZ,  metoprolol, nitroglycerin, rosuvastatin Increase Imdur to 120 mg daily Heart healthy low-sodium diet-salty 6 given Increase physical activity as tolerated  Moderate aortic stenosis-no increased DOE or activity intolerance.  Cardiac catheterization showed stable mean gradient of 33 mmHg. Continue to monitor.  Essential hypertension-BP today 124/60.  Well-controlled at home. Continue BenzePrO, hydrochlorothiazide, Imdur, metoprolol Heart healthy low-sodium diet-salty 6 given Increase physical activity as tolerated  Hyperlipidemia-01/14/2021: Cholesterol 109; HDL 44; LDL Cholesterol 39; Triglycerides 131; VLDL 26 Continue aspirin, ezetimibe, rosuvastatin Heart healthy low-sodium high-fiber diet Increase physical activity as tolerated  Disposition: Follow-up with Dr. [name] or me in 1-2 months.  Jossie Ng. Diron Haddon NP-C    10/01/2021, 2:37 PM [address] Office [phone] Fax [phone]  Notice: This dictation was prepared with Dragon dictation along with smaller phrase technology. Any transcriptional errors that result from this process are unintentional and may not be corrected upon review.  I spent 14 minutes examining this patient, reviewing medications, and using patient centered shared decision making involving her cardiac care.  Prior to her visit I spent greater than 20 minutes reviewing her past medical history,   medications, and prior cardiac tests.

## 2021-10-01 ENCOUNTER — Ambulatory Visit (INDEPENDENT_AMBULATORY_CARE_PROVIDER_SITE_OTHER): Payer: Medicare Other | Admitting: General Practice

## 2021-10-01 ENCOUNTER — Encounter: Payer: Self-pay | Admitting: General Practice

## 2021-10-01 ENCOUNTER — Other Ambulatory Visit: Payer: Self-pay

## 2021-10-01 VITALS — BP 124/60 | HR 68 | Ht 69.0 in | Wt 204.0 lb

## 2021-10-01 DIAGNOSIS — I10 Essential (primary) hypertension: Secondary | ICD-10-CM

## 2021-10-01 DIAGNOSIS — I350 Nonrheumatic aortic (valve) stenosis: Secondary | ICD-10-CM | POA: Diagnosis not present

## 2021-10-01 DIAGNOSIS — I25119 Atherosclerotic heart disease of native coronary artery with unspecified angina pectoris: Secondary | ICD-10-CM

## 2021-10-01 DIAGNOSIS — E782 Mixed hyperlipidemia: Secondary | ICD-10-CM

## 2021-10-01 MED ORDER — ISOSORBIDE MONONITRATE ER 120 MG PO TB24: 120.0000 mg | ORAL_TABLET | Freq: Every day | ORAL | 6 refills | Status: DC

## 2021-10-01 NOTE — Patient Instructions (Signed)
Medication Instructions:  INCREASE ISOSORBIDE 120MG DAILY  *If you need a refill on your cardiac medications before your next appointment, please call your pharmacy*  Lab Work:   Testing/Procedures:  NONE    NONE  Special Instructions PLEASE MAINTAIN PHYSICAL ACTIVITY AS TOLERATED   Follow-Up: Your next appointment:  ON 11-29-2021 at  1:55 PM  In Person with Coletta Memos, FNP   At [facility], you and your health needs are our priority.  As part of our continuing mission to provide you with exceptional heart care, we have created designated Provider Care Teams.  These Care Teams include your primary Cardiologist (physician) and Advanced Practice Providers (APPs -  Physician Assistants and Nurse Practitioners) who all work together to provide you with the care you need, when you need it.

## 2021-10-05 DIAGNOSIS — I10 Essential (primary) hypertension: Secondary | ICD-10-CM | POA: Diagnosis not present

## 2021-10-05 DIAGNOSIS — I2510 Atherosclerotic heart disease of native coronary artery without angina pectoris: Secondary | ICD-10-CM | POA: Diagnosis not present

## 2021-10-05 DIAGNOSIS — R310 Gross hematuria: Secondary | ICD-10-CM | POA: Diagnosis not present

## 2021-10-05 DIAGNOSIS — E7800 Pure hypercholesterolemia, unspecified: Secondary | ICD-10-CM | POA: Diagnosis not present

## 2021-10-05 DIAGNOSIS — E1165 Type 2 diabetes mellitus with hyperglycemia: Secondary | ICD-10-CM | POA: Diagnosis not present

## 2021-10-05 DIAGNOSIS — N400 Enlarged prostate without lower urinary tract symptoms: Secondary | ICD-10-CM | POA: Diagnosis not present

## 2021-10-12 DIAGNOSIS — R310 Gross hematuria: Secondary | ICD-10-CM | POA: Diagnosis not present

## 2021-10-18 ENCOUNTER — Other Ambulatory Visit: Payer: Self-pay | Admitting: Cardiovascular Disease

## 2021-10-26 ENCOUNTER — Other Ambulatory Visit: Payer: Self-pay | Admitting: Cardiovascular Disease

## 2021-10-26 DIAGNOSIS — K5730 Diverticulosis of large intestine without perforation or abscess without bleeding: Secondary | ICD-10-CM | POA: Diagnosis not present

## 2021-10-26 DIAGNOSIS — N400 Enlarged prostate without lower urinary tract symptoms: Secondary | ICD-10-CM | POA: Diagnosis not present

## 2021-10-26 DIAGNOSIS — N3289 Other specified disorders of bladder: Secondary | ICD-10-CM | POA: Diagnosis not present

## 2021-10-26 DIAGNOSIS — R310 Gross hematuria: Secondary | ICD-10-CM | POA: Diagnosis not present

## 2021-11-15 ENCOUNTER — Other Ambulatory Visit: Payer: Self-pay | Admitting: Cardiovascular Disease

## 2021-11-24 DIAGNOSIS — Z7984 Long term (current) use of oral hypoglycemic drugs: Secondary | ICD-10-CM | POA: Diagnosis not present

## 2021-11-24 DIAGNOSIS — I10 Essential (primary) hypertension: Secondary | ICD-10-CM | POA: Diagnosis not present

## 2021-11-24 DIAGNOSIS — Z7985 Long-term (current) use of injectable non-insulin antidiabetic drugs: Secondary | ICD-10-CM | POA: Diagnosis not present

## 2021-11-24 DIAGNOSIS — Z951 Presence of aortocoronary bypass graft: Secondary | ICD-10-CM | POA: Diagnosis not present

## 2021-11-24 DIAGNOSIS — E1159 Type 2 diabetes mellitus with other circulatory complications: Secondary | ICD-10-CM | POA: Diagnosis not present

## 2021-11-24 DIAGNOSIS — E785 Hyperlipidemia, unspecified: Secondary | ICD-10-CM | POA: Diagnosis not present

## 2021-11-25 NOTE — Progress Notes (Signed)
Cardiology Clinic Note   Patient Name: [name] [name] Date of Encounter: 11/29/2021  Primary Care Provider:  Leighton Ruff, MD (Inactive) Primary Cardiologist:  Sherren Mocha, MD  Patient Profile    [name] [name] 76 year old male presents the clinic today for follow-up evaluation of his coronary artery disease and hypertension.  Past Medical History    Past Medical History:  Diagnosis Date   Aortic stenosis    Arrhythmia    Premature beats   Coronary atherosclerosis of artery bypass graft    CABG 2009, LHC 07/2016 restenosis of SVG to OM treated with DES   Diabetes mellitus ([place])    Essential hypertension, benign    Fatty liver    History of kidney stones    PAD (peripheral artery disease) ([place])    Pure hypercholesterolemia    Past Surgical History:  Procedure Laterality Date   ABDOMINAL AORTOGRAM W/LOWER EXTREMITY N/A 12/30/2020   Procedure: ABDOMINAL AORTOGRAM W/LOWER EXTREMITY;  Surgeon: Wellington Hampshire, MD;  Location: [facility] [place];  Service: Cardiovascular;  Laterality: N/A;   ADENOIDECTOMY     CARDIAC CATHETERIZATION N/A 07/18/2016   Procedure: Left Heart Cath and Cors/Grafts Angiography;  Surgeon: Burnell Blanks, MD;  Location: [facility];  Service: Cardiovascular;  Laterality: N/A;   CARDIAC CATHETERIZATION N/A 07/18/2016   Procedure: Coronary Stent Intervention;  Surgeon: Burnell Blanks, MD;  Location: [facility] [facility];  Service: Cardiovascular;  Laterality: N/A;   CHOLECYSTECTOMY     CORONARY ANGIOPLASTY WITH STENT PLACEMENT     CORONARY ARTERY BYPASS GRAFT     CORONARY BALLOON ANGIOPLASTY N/A 09/10/2021   Procedure: CORONARY BALLOON ANGIOPLASTY;  Surgeon: Sherren Mocha, MD;  Location: [facility] [facility];  Service: Cardiovascular;  Laterality: N/A;   CORONARY STENT INTERVENTION N/A 01/11/2019   Procedure: CORONARY STENT INTERVENTION;  Surgeon: Sherren Mocha, MD;  Location: [place] [facility];  Service: Cardiovascular;   Laterality: N/A;   ENDARTERECTOMY FEMORAL Right 01/13/2021   Procedure: RIGHT FEMORAL ENDARTERECTOMY  WITH PATCH ANGIOPLASTY;  Surgeon: Rosetta Posner, MD;  Location: [facility];  Service: Vascular;  Laterality: Right;   INGUINAL HERNIA REPAIR Right    LEFT HEART CATH AND CORS/GRAFTS ANGIOGRAPHY N/A 01/11/2019   Procedure: LEFT HEART CATH AND CORS/GRAFTS ANGIOGRAPHY;  Surgeon: Sherren Mocha, MD;  Location: [facility];  Service: Cardiovascular;  Laterality: N/A;   LEFT HEART CATH AND CORS/GRAFTS ANGIOGRAPHY N/A 09/10/2021   Procedure: LEFT HEART CATH AND CORS/GRAFTS ANGIOGRAPHY;  Surgeon: Sherren Mocha, MD;  Location: [place] [facility];  Service: Cardiovascular;  Laterality: N/A;   Right femoral endarterectomy and Dacron patch angioplasty  01/13/3021   TONSILLECTOMY      Allergies  Allergies  Allergen Reactions   Empagliflozin Other (See Comments)    Abdominal pain/dizziness    History of Present Illness    [name] [name] is a PMH of coronary artery disease status post CABG/PCI, PAD, carotid artery disease (1-39% bilateral ICA stenosis 11/22), HTN, diabetes, fatty liver, renal calculi, HLD, and aortic stenosis.  He presented to the hospital 09/10/2021 for planned cardiac catheterization.  He had previously underwent CABG and PCI after his bypass surgery.  His last percutaneous coronary mention was in 2020 which involved stenting of the calcified lesions in the right PDA beyond the vein graft.  He underwent right common femoral endarterectomy early 2022 to treat severe common femoral artery stenosis and right leg claudication.  He has echocardiogram 08/09/2021 showed an EF of 60-65%,  moderate aortic stenosis, and no significant changes from his previous study.  He was seen in the office 09/01/2021 and noted chest tightness.  There was concern for unstable angina.  Cardiac catheterization was ordered for further evaluation.  He underwent cardiac catheterization on 09/10/2021 which showed  severe native three-vessel coronary artery disease with total occlusion of his RCA and left main.  He was noted to have continued patency of his LIMA-LAD, SVG-distal RCA, SVG-diagonal, SVG-OM.  He had mild in-stent restenosis of his native PDA ostium of 40%.  He was noted to have severe in-stent restenosis of the stented segment of the SVG-OM estimated at 75%.  He received successful balloon angioplasty of severe in-stent restenosis involving the SVG-OM which reduced the stenosis from 75% to 20%.  He was noted to have moderate aortic stenosis with mean gradient of 33 mmHg.  Ongoing medical management and surveillance for his aortic stenosis was recommended.  He presented the clinic 10/01/21 for follow-up evaluation stated he noticed chest discomfort with prolonged walking.  He continued to stay physically active doing yard work.  With short activities such as lifting objects or brief bouts of physical activity he did not notice any chest discomfort.  He reported that he had taken nitroglycerin 4-5 times since having his angioplasty.  He noticed relief after his nitroglycerin.  We reviewed his medication list and I  increased his Imdur.  He did not notice chest discomfort or pressure at rest.  He reported that the chest discomfort that he did have was less than what he had prior to his angioplasty.  His left radial cath site was clean dry intact and had no signs of infection.  We reviewed his previous lipid panel.  We planned follow-up in 1-2 months.  He presents to the clinic today for follow-up evaluation and states he still notices some chest discomfort with prolonged bouts of walking/physical activity.  He reports that his discomfort is about a 5 out of 10.  He does note that with the increase in Imdur he has less chest discomfort with these longer bouts of physical activity.  His blood pressure today is 110/58 and his pulse is 59.  I will decrease his metoprolol and increase his Imdur.  I have asked him to  continue his physical activity.  We will plan follow-up for 4 to 6 months.  Today he denies chest pain, shortness of breath, lower extremity edema, fatigue, palpitations, melena, hematuria, hemoptysis, diaphoresis, weakness, presyncope, syncope, orthopnea, and PND.   Home Medications    Prior to Admission medications   Medication Sig Start Date End Date Taking? Authorizing Provider  amLODipine (NORVASC) 10 MG tablet TAKE 1 TABLET BY MOUTH EVERY DAY 05/21/21   Sherren Mocha, MD  aspirin 81 MG EC tablet TAKE 1 TABLET BY MOUTH EVERY DAY 02/23/21   Sherren Mocha, MD  benazepril (LOTENSIN) 40 MG tablet Take 40 mg by mouth daily.  04/30/12   [provider]  clopidogrel (PLAVIX) 75 MG tablet Take 1 tablet (75 mg total) by mouth daily. 04/13/21   Sherren Mocha, MD  ezetimibe (ZETIA) 10 MG tablet Take 1 tablet (10 mg total) by mouth daily. 01/24/20 01/18/21  Verta Ellen., NP  glucose blood (PRECISION QID TEST) test strip 1 each by Other route as directed. 08/03/20   [provider]  hydrochlorothiazide (HYDRODIURIL) 25 MG tablet TAKE 1 TABLET BY MOUTH EVERY DAY 09/24/21   Sherren Mocha, MD  insulin NPH Human (NOVOLIN N) 100 UNIT/ML injection Inject  15 Units into the skin 2 (two) times daily before a meal.    [provider]  Insulin Pen Needle (B-D UF III MINI PEN NEEDLES) 31G X 5 MM MISC Use to inject Ozempic once a week 01/02/21   [provider]  isosorbide mononitrate (IMDUR) 60 MG 24 hr tablet TAKE 1 TABLET BY MOUTH EVERY DAY 03/01/21   Sherren Mocha, MD  metFORMIN (GLUCOPHAGE) 1000 MG tablet Take 1,000 mg by mouth 2 (two) times daily with a meal. 05/21/19   [provider]  metoprolol tartrate (LOPRESSOR) 50 MG tablet TAKE 1.5 TABLETS (75 MG TOTAL) BY MOUTH 2 (TWO) TIMES DAILY. 01/19/21   Sherren Mocha, MD  Multiple Vitamin (MULTIVITAMIN WITH MINERALS) TABS tablet Take 1 tablet by mouth daily.    [provider]  nitroGLYCERIN (NITROSTAT)  0.4 MG SL tablet PLACE 1 TABLET (0.4 MG TOTAL) UNDER THE TONGUE EVERY 5 (FIVE) MINUTES AS NEEDED FOR CHEST PAIN. 01/12/21   Sherren Mocha, MD  rosuvastatin (CRESTOR) 20 MG tablet TAKE 1 TABLET BY MOUTH EVERY DAY 04/08/21   Sherren Mocha, MD    Family History    Family History  Problem Relation Age of Onset   Heart disease Mother        No details   Diabetes Neg Hx    He indicated that his mother is deceased. He indicated that his father is deceased. He indicated that his maternal grandmother is deceased. He indicated that his maternal grandfather is deceased. He indicated that his paternal grandmother is deceased. He indicated that his paternal grandfather is deceased. He indicated that the status of his neg hx is unknown.  Social History    Social History   Socioeconomic History   Marital status: Married    Spouse name: Not on file   Number of children: Not on file   Years of education: Not on file   Highest education level: Not on file  Occupational History   Not on file  Tobacco Use   Smoking status: Never   Smokeless tobacco: Never  Vaping Use   Vaping Use: Never used  Substance and Sexual Activity   Alcohol use: No   Drug use: No   Sexual activity: Not on file  Other Topics Concern   Not on file  Social History Narrative   Not on file   Social Determinants of Health   Financial Resource Strain: Not on file  Food Insecurity: Not on file  Transportation Needs: Not on file  Physical Activity: Not on file  Stress: Not on file  Social Connections: Not on file  Intimate Partner Violence: Not on file     Review of Systems    General:  No chills, fever, night sweats or weight changes.  Cardiovascular:  No chest pain, dyspnea on exertion, edema, orthopnea, palpitations, paroxysmal nocturnal dyspnea. Dermatological: No rash, lesions/masses Respiratory: No cough, dyspnea Urologic: No hematuria, dysuria Abdominal:   No nausea, vomiting, diarrhea, bright red blood  per rectum, melena, or hematemesis Neurologic:  No visual changes, wkns, changes in mental status. All other systems reviewed and are otherwise negative except as noted above.  Physical Exam    VS:  BP (!) 110/58    Pulse (!) 59    Ht 5' 9"  (1.753 m)    Wt 202 lb 12.8 oz (92 kg)    SpO2 98%    BMI 29.95 kg/m  , BMI Body mass index is 29.95 kg/m. GEN: Well nourished, well developed, in no acute  distress. HEENT: normal. Neck: Supple, no JVD, carotid bruits, or masses. Cardiac: RRR, no murmurs, rubs, or gallops. No clubbing, cyanosis, edema.  Radials/DP/PT 2+ and equal bilaterally.  Respiratory:  Respirations regular and unlabored, clear to auscultation bilaterally. GI: Soft, nontender, nondistended, BS + x 4. MS: no deformity or atrophy. Skin: warm and dry, no rash.  Neuro. Neuro:  Strength and sensation are intact. Psych: Normal affect.  Accessory Clinical Findings    Recent Labs: 01/11/2021: ALT 24 09/01/2021: BUN 16; Creatinine, Ser 1.07; Hemoglobin 13.5; Platelets 291; Potassium 4.6; Sodium 143   Recent Lipid Panel    Component Value Date/Time   CHOL 109 01/14/2021 0105   CHOL 129 04/27/2020 0824   TRIG 131 01/14/2021 0105   HDL 44 01/14/2021 0105   HDL 39 (L) 04/27/2020 0824   CHOLHDL 2.5 01/14/2021 0105   VLDL 26 01/14/2021 0105   LDLCALC 39 01/14/2021 0105   LDLCALC 63 04/27/2020 0824    ECG personally reviewed by me today-none today.  Echocardiogram 08/09/2021 IMPRESSIONS     1. Left ventricular ejection fraction, by estimation, is 60 to 65%. The  left ventricle has normal function. The left ventricle has no regional  wall motion abnormalities. There is mild concentric left ventricular  hypertrophy. Left ventricular diastolic  parameters were normal.   2. Right ventricular systolic function is normal. The right ventricular  size is normal. Tricuspid regurgitation signal is inadequate for assessing  PA pressure.   3. The mitral valve is grossly normal.  Trivial mitral valve  regurgitation. No evidence of mitral stenosis.   4. The aortic valve is tricuspid. There is moderate calcification of the  aortic valve. There is moderate thickening of the aortic valve. Aortic  valve regurgitation is not visualized. Moderate aortic valve stenosis.  Aortic valve area, by VTI measures  1.12 cm. Aortic valve mean gradient measures 20.0 mmHg. Aortic valve Vmax  measures 3.03 m/s.   5. The inferior vena cava is normal in size with greater than 50%  respiratory variability, suggesting right atrial pressure of 3 mmHg.   Comparison(s): No significant change from prior study.   Cardiac catheterization 09/10/2021   Ost LAD to Prox LAD lesion is 100% stenosed.   Dist LAD lesion is 70% stenosed.   Ost Cx to Prox Cx lesion is 99% stenosed.   Dist RCA lesion is 40% stenosed.   Prox RCA to Mid RCA lesion is 100% stenosed.   Ost RPDA to RPDA lesion is 40% stenosed.   Dist Graft to Insertion lesion is 75% stenosed.   Balloon angioplasty was performed using a BALLN [place] RX 2.5X15.   Post intervention, there is a 20% residual stenosis.   SVG and is normal in caliber.   SVG and is normal in caliber.   SVG and is normal in caliber.   LIMA and is normal in caliber.   There is moderate aortic valve stenosis.   There is no mitral valve stenosis.   1.  Severe native three-vessel coronary artery disease with total occlusion of the RCA and total occlusion of the left main. 2.  Status post aortocoronary bypass surgery with continued patency of the LIMA to LAD, saphenous vein graft to distal RCA, saphenous vein graft to diagonal, and saphenous vein graft to OM.  There is mild in-stent restenosis in the native PDA ostium estimated at 40%.  There is severe in-stent restenosis at the stented segment of the SVG to OM anastomosis, estimated at 75%. 3.  Successful balloon  angioplasty of severe in-stent restenosis involving the SVG to obtuse marginal distal anastomosis,  reducing 75% stenosis to 20%. 4.  Moderate aortic stenosis with mean gradient 33 mmHg   Recommend: Same-day PCI protocol as long as no early complications arise.  Ongoing medical therapy and surveillance of aortic stenosis.  If persistent lifestyle limiting symptoms, will need to consider redo CABG/aortic valve replacement versus TAVR and ongoing medical therapy.  If significant improvement in symptoms, we will continue with our current management of medical therapy and surveillance of aortic stenosis.  Diagnostic Dominance: Right Intervention    Assessment & Plan   1.  Coronary artery disease-having less chest pain with same physical activity.  Continues to notice chest discomfort with extended periods of walking.  Continues to deny chest discomfort with short bouts of increased physical activity.  Cardiac catheterization 09/10/2021.  He was noted to have in-stent restenosis of his SVG-OM graft.  He received successful balloon angioplasty which reduced the stenosis to 20%.  Details above. Continue amlodipine, aspirin, clopidogrel, benazepril, ezetimibe, HCTZ,  metoprolol, nitroglycerin, rosuvastatin Increase Imdur to 180 mg daily Heart healthy low-sodium diet-salty 6 given Increase physical activity as tolerated  Essential hypertension-BP today 110/58.  Well-controlled at home. Continue BenzePrO, hydrochlorothiazide, Imdur, metoprolol Reduce metoprolol to 50 mg twice daily Heart healthy low-sodium diet-salty 6 given Increase physical activity as tolerated  Moderate aortic stenosis-no increased DOE or activity intolerance.  Cardiac catheterization showed stable mean gradient of 33 mmHg.  Blood pressure continues to be well controlled. Continue to monitor.  Hyperlipidemia-01/14/2021: Cholesterol 109; HDL 44; LDL Cholesterol 39; Triglycerides 131; VLDL 26 Continue aspirin, ezetimibe, rosuvastatin Heart healthy low-sodium high-fiber diet Increase physical activity as tolerated Repeat  fasting lipids and LFTs 3/23  Disposition: Follow-up with Dr. [name] or APP in 4-6 months.  Jossie Ng. Colisha Redler NP-C    11/29/2021, 2:20 PM [facility] [address] Office [phone] Fax [phone]  Notice: This dictation was prepared with Dragon dictation along with smaller phrase technology. Any transcriptional errors that result from this process are unintentional and may not be corrected upon review.  I spent 14 minutes examining this patient, reviewing medications, and using patient centered shared decision making involving her cardiac care.  Prior to her visit I spent greater than 20 minutes reviewing her past medical history,  medications, and prior cardiac tests.

## 2021-11-29 ENCOUNTER — Other Ambulatory Visit: Payer: Self-pay

## 2021-11-29 ENCOUNTER — Encounter: Payer: Self-pay | Admitting: General Practice

## 2021-11-29 ENCOUNTER — Ambulatory Visit (INDEPENDENT_AMBULATORY_CARE_PROVIDER_SITE_OTHER): Payer: Medicare Other | Admitting: General Practice

## 2021-11-29 VITALS — BP 110/58 | HR 59 | Ht 69.0 in | Wt 202.8 lb

## 2021-11-29 DIAGNOSIS — I10 Essential (primary) hypertension: Secondary | ICD-10-CM

## 2021-11-29 DIAGNOSIS — I350 Nonrheumatic aortic (valve) stenosis: Secondary | ICD-10-CM | POA: Diagnosis not present

## 2021-11-29 DIAGNOSIS — E782 Mixed hyperlipidemia: Secondary | ICD-10-CM

## 2021-11-29 DIAGNOSIS — I25119 Atherosclerotic heart disease of native coronary artery with unspecified angina pectoris: Secondary | ICD-10-CM | POA: Diagnosis not present

## 2021-11-29 MED ORDER — ISOSORBIDE MONONITRATE ER 120 MG PO TB24: 120.0000 mg | ORAL_TABLET | Freq: Every day | ORAL | 1 refills | Status: DC

## 2021-11-29 MED ORDER — METOPROLOL TARTRATE 50 MG PO TABS: 50.0000 mg | ORAL_TABLET | Freq: Two times a day (BID) | ORAL | 3 refills | Status: DC

## 2021-11-29 NOTE — Patient Instructions (Signed)
Medication Instructions:  DECREASE METOPROLOL 50MG TWICE DAILY  TRY ISOSORBIDE 180MG (1 1/2 TAB) DAILY  *If you need a refill on your cardiac medications before your next appointment, please call your pharmacy*  Lab Work:   Testing/Procedures:  NONE    NONE  Follow-Up: Your next appointment:  4-6 month(s) In Person with Sherren Mocha, MD    At [facility], you and your health needs are our priority.  As part of our continuing mission to provide you with exceptional heart care, we have created designated Provider Care Teams.  These Care Teams include your primary Cardiologist (physician) and Advanced Practice Providers (APPs -  Physician Assistants and Nurse Practitioners) who all work together to provide you with the care you need, when you need it.

## 2021-12-08 DIAGNOSIS — R972 Elevated prostate specific antigen [PSA]: Secondary | ICD-10-CM | POA: Diagnosis not present

## 2021-12-08 DIAGNOSIS — R310 Gross hematuria: Secondary | ICD-10-CM | POA: Diagnosis not present

## 2021-12-08 DIAGNOSIS — N401 Enlarged prostate with lower urinary tract symptoms: Secondary | ICD-10-CM | POA: Diagnosis not present

## 2021-12-08 DIAGNOSIS — R3912 Poor urinary stream: Secondary | ICD-10-CM | POA: Diagnosis not present

## 2021-12-10 ENCOUNTER — Other Ambulatory Visit: Payer: Self-pay | Admitting: Cardiovascular Disease

## 2022-01-17 ENCOUNTER — Other Ambulatory Visit: Payer: Self-pay | Admitting: Cardiovascular Disease

## 2022-02-17 ENCOUNTER — Ambulatory Visit (HOSPITAL_COMMUNITY): Payer: Medicare Other | Attending: Cardiology

## 2022-02-17 DIAGNOSIS — I350 Nonrheumatic aortic (valve) stenosis: Secondary | ICD-10-CM | POA: Diagnosis not present

## 2022-02-17 LAB — ECHOCARDIOGRAM COMPLETE
AR max vel: 1.45 cm2
AV Area VTI: 1.56 cm2
AV Area mean vel: 1.41 cm2
AV Mean grad: 24.2 mmHg
AV Peak grad: 43.2 mmHg
Ao pk vel: 3.29 m/s
Area-P 1/2: 3.37 cm2
S' Lateral: 1.9 cm

## 2022-03-02 DIAGNOSIS — R972 Elevated prostate specific antigen [PSA]: Secondary | ICD-10-CM | POA: Diagnosis not present

## 2022-03-04 ENCOUNTER — Encounter: Payer: Self-pay | Admitting: Cardiovascular Disease

## 2022-03-04 ENCOUNTER — Ambulatory Visit (INDEPENDENT_AMBULATORY_CARE_PROVIDER_SITE_OTHER): Payer: Medicare Other | Admitting: Cardiovascular Disease

## 2022-03-04 VITALS — BP 120/70 | HR 67 | Ht 69.0 in | Wt 198.0 lb

## 2022-03-04 DIAGNOSIS — I350 Nonrheumatic aortic (valve) stenosis: Secondary | ICD-10-CM

## 2022-03-04 DIAGNOSIS — I10 Essential (primary) hypertension: Secondary | ICD-10-CM | POA: Diagnosis not present

## 2022-03-04 DIAGNOSIS — I25119 Atherosclerotic heart disease of native coronary artery with unspecified angina pectoris: Secondary | ICD-10-CM | POA: Diagnosis not present

## 2022-03-04 DIAGNOSIS — E782 Mixed hyperlipidemia: Secondary | ICD-10-CM

## 2022-03-04 MED ORDER — RANOLAZINE ER 500 MG PO TB12: 500.0000 mg | ORAL_TABLET | Freq: Two times a day (BID) | ORAL | 3 refills | Status: DC

## 2022-03-04 NOTE — Patient Instructions (Signed)
Medication Instructions:  START Ranexa 51m twice daily  *If you need a refill on your cardiac medications before your next appointment, please call your pharmacy*   Lab Work: NONE If you have labs (blood work) drawn today and your tests are completely normal, you will receive your results only by: MGreer(if you have MyChart) OR A paper copy in the mail If you have any lab test that is abnormal or we need to change your treatment, we will call you to review the results.   Testing/Procedures: NONE   Follow-Up: At [facility] you and your health needs are our priority.  As part of our continuing mission to provide you with exceptional heart care, we have created designated Provider Care Teams.  These Care Teams include your primary Cardiologist (physician) and Advanced Practice Providers (APPs -  Physician Assistants and Nurse Practitioners) who all work together to provide you with the care you need, when you need it.  We recommend signing up for the patient portal called "MyChart".  Sign up information is provided on this After Visit Summary.  MyChart is used to connect with patients for Virtual Visits (Telemedicine).  Patients are able to view lab/test results, encounter notes, upcoming appointments, etc.  Non-urgent messages can be sent to your provider as well.   To learn more about what you can do with MyChart, go to [email or web address]    Your next appointment:   6 month(s)  The format for your next appointment:   In Person  Provider:   MSherren Mocha MD       Important Information About Sugar

## 2022-03-04 NOTE — Progress Notes (Signed)
Cardiology Office Note:    Date:  03/04/2022   ID:  [name] [name], DOB 1946/10/04, MRN [id number]  PCP:  Leighton Ruff, MD (Inactive)   [facility] Providers Cardiologist:  Sherren Mocha, MD     Referring MD: No ref. provider found   Chief Complaint  Patient presents with   Chest Pain    History of Present Illness:    [name] [name] is a 76 y.o. male with a hx of coronary and peripheral arterial disease, aortic stenosis, presenting for follow-up evaluation today.  He has undergone CABG and PCI procedures after bypass surgery.  His last PCI procedure in 2020 involved stenting of calcific lesion in the right PDA beyond the vein graft insertion site.  The patient underwent right common femoral endarterectomy last year to treat severe common femoral artery stenosis and right leg claudication.  He underwent PCI in November 2022 with treatment of the saphenous vein graft to obtuse marginal for treatment of progressive anginal chest pain.  The patient is here alone today.  He has continued to experience exertional angina since he underwent PCI last year.  He reports no major change in his symptoms and experiences angina with a long walk or with some types of yard work.  There is mild associated shortness of breath.  He denies any progressive symptoms.  He has not had any resting pain, orthopnea, PND, recurrent calf claudication, or leg swelling.  He recently had an echocardiogram that showed slight progression of aortic stenosis, but still clearly in the moderate range.  His isosorbide was increased to 1-1/2 tablets daily but he has not appreciated any improvement in his symptoms.  Past Medical History:  Diagnosis Date   Aortic stenosis    Arrhythmia    Premature beats   Coronary atherosclerosis of artery bypass graft    CABG 2009, LHC 07/2016 restenosis of SVG to OM treated with DES   Diabetes mellitus ([place])    Essential hypertension, benign    Fatty liver    History of kidney  stones    PAD (peripheral artery disease) ([place])    Pure hypercholesterolemia     Past Surgical History:  Procedure Laterality Date   ABDOMINAL AORTOGRAM W/LOWER EXTREMITY N/A 12/30/2020   Procedure: ABDOMINAL AORTOGRAM W/LOWER EXTREMITY;  Surgeon: Wellington Hampshire, MD;  Location: [place] [facility];  Service: Cardiovascular;  Laterality: N/A;   ADENOIDECTOMY     CARDIAC CATHETERIZATION N/A 07/18/2016   Procedure: Left Heart Cath and Cors/Grafts Angiography;  Surgeon: Burnell Blanks, MD;  Location: [place] [facility];  Service: Cardiovascular;  Laterality: N/A;   CARDIAC CATHETERIZATION N/A 07/18/2016   Procedure: Coronary Stent Intervention;  Surgeon: Burnell Blanks, MD;  Location: [place] [facility];  Service: Cardiovascular;  Laterality: N/A;   CHOLECYSTECTOMY     CORONARY ANGIOPLASTY WITH STENT PLACEMENT     CORONARY ARTERY BYPASS GRAFT     CORONARY BALLOON ANGIOPLASTY N/A 09/10/2021   Procedure: CORONARY BALLOON ANGIOPLASTY;  Surgeon: Sherren Mocha, MD;  Location: [place] [facility];  Service: Cardiovascular;  Laterality: N/A;   CORONARY STENT INTERVENTION N/A 01/11/2019   Procedure: CORONARY STENT INTERVENTION;  Surgeon: Sherren Mocha, MD;  Location: [place] [facility];  Service: Cardiovascular;  Laterality: N/A;   ENDARTERECTOMY FEMORAL Right 01/13/2021   Procedure: RIGHT FEMORAL ENDARTERECTOMY  WITH PATCH ANGIOPLASTY;  Surgeon: Rosetta Posner, MD;  Location: [facility];  Service: Vascular;  Laterality: Right;   INGUINAL HERNIA REPAIR Right    LEFT  HEART CATH AND CORS/GRAFTS ANGIOGRAPHY N/A 01/11/2019   Procedure: LEFT HEART CATH AND CORS/GRAFTS ANGIOGRAPHY;  Surgeon: Sherren Mocha, MD;  Location: [place] [facility];  Service: Cardiovascular;  Laterality: N/A;   LEFT HEART CATH AND CORS/GRAFTS ANGIOGRAPHY N/A 09/10/2021   Procedure: LEFT HEART CATH AND CORS/GRAFTS ANGIOGRAPHY;  Surgeon: Sherren Mocha, MD;  Location: [place] [facility];  Service: Cardiovascular;   Laterality: N/A;   Right femoral endarterectomy and Dacron patch angioplasty  01/13/3021   TONSILLECTOMY      Current Medications: Current Meds  Medication Sig   amLODipine (NORVASC) 10 MG tablet TAKE 1 TABLET BY MOUTH EVERY DAY   aspirin 81 MG EC tablet TAKE 1 TABLET BY MOUTH EVERY DAY   benazepril (LOTENSIN) 40 MG tablet Take 40 mg by mouth daily.    clopidogrel (PLAVIX) 75 MG tablet TAKE 1 TABLET BY MOUTH EVERY DAY   ezetimibe (ZETIA) 10 MG tablet Take 1 tablet (10 mg total) by mouth daily.   glucose blood (PRECISION QID TEST) test strip 1 each by Other route as directed.   hydrochlorothiazide (HYDRODIURIL) 25 MG tablet TAKE 1 TABLET BY MOUTH EVERY DAY   insulin NPH Human (NOVOLIN N) 100 UNIT/ML injection Inject 15 Units into the skin 2 (two) times daily before a meal.   Insulin Pen Needle (B-D UF III MINI PEN NEEDLES) 31G X 5 MM MISC Use to inject Ozempic once a week   isosorbide mononitrate (IMDUR) 120 MG 24 hr tablet Take 1 tablet (120 mg total) by mouth daily.   metFORMIN (GLUCOPHAGE) 1000 MG tablet Take 1,000 mg by mouth 2 (two) times daily with a meal.   metoprolol tartrate (LOPRESSOR) 50 MG tablet Take 1 tablet (50 mg total) by mouth 2 (two) times daily.   Multiple Vitamin (MULTIVITAMIN WITH MINERALS) TABS tablet Take 1 tablet by mouth daily.   nitroGLYCERIN (NITROSTAT) 0.4 MG SL tablet PLACE 1 TABLET UNDER THE TONGUE EVERY 5 MINUTES AS NEEDED FOR CHEST PAIN.   OZEMPIC, 0.25 OR 0.5 MG/DOSE, 2 MG/1.5ML SOPN SMARTSIG:0.2 Milliliter(s) SUB-Q Once a Week   rosuvastatin (CRESTOR) 20 MG tablet TAKE 1 TABLET BY MOUTH EVERY DAY   ticagrelor (BRILINTA) 90 MG TABS tablet 1 tablet (90 mg total) 2 times daily.   Zinc Gluconate 100 MG TABS 1 tablet     Allergies:   Empagliflozin   Social History   Socioeconomic History   Marital status: Married    Spouse name: Not on file   Number of children: Not on file   Years of education: Not on file   Highest education level: Not on file   Occupational History   Not on file  Tobacco Use   Smoking status: Never   Smokeless tobacco: Never  Vaping Use   Vaping Use: Never used  Substance and Sexual Activity   Alcohol use: No   Drug use: No   Sexual activity: Not on file  Other Topics Concern   Not on file  Social History Narrative   Not on file   Social Determinants of Health   Financial Resource Strain: Not on file  Food Insecurity: Not on file  Transportation Needs: Not on file  Physical Activity: Not on file  Stress: Not on file  Social Connections: Not on file     Family History: The patient's family history includes Heart disease in his mother. There is no history of Diabetes.  ROS:   Please see the history of present illness.    All other systems  reviewed and are negative.  EKGs/Labs/Other Studies Reviewed:    The following studies were reviewed today: Cardiac catheterization films were reviewed with the patient from his most recent heart catheterization and November 2022.  Echocardiogram is reviewed personally with both review of the report and the images.  EKG:  EKG is ordered today.  The ekg ordered today demonstrates normal sinus rhythm 67 bpm, ST and T wave abnormality consider lateral ischemia.  Recent Labs: 09/01/2021: BUN 16; Creatinine, Ser 1.07; Hemoglobin 13.5; Platelets 291; Potassium 4.6; Sodium 143  Recent Lipid Panel    Component Value Date/Time   CHOL 109 01/14/2021 0105   CHOL 129 04/27/2020 0824   TRIG 131 01/14/2021 0105   HDL 44 01/14/2021 0105   HDL 39 (L) 04/27/2020 0824   CHOLHDL 2.5 01/14/2021 0105   VLDL 26 01/14/2021 0105   LDLCALC 39 01/14/2021 0105   LDLCALC 63 04/27/2020 0824     Risk Assessment/Calculations:           Physical Exam:    VS:  BP 120/70   Pulse 67   Ht 5' 9"  (1.753 m)   Wt 198 lb (89.8 kg)   SpO2 98%   BMI 29.24 kg/m     Wt Readings from Last 3 Encounters:  03/04/22 198 lb (89.8 kg)  11/29/21 202 lb 12.8 oz (92 kg)  10/01/21 204  lb (92.5 kg)     GEN:  Well nourished, well developed in no acute distress HEENT: Normal NECK: No JVD; No carotid bruits LYMPHATICS: No lymphadenopathy CARDIAC: RRR, 3/6 harsh systolic murmur, mid peaking, A2 present RESPIRATORY:  Clear to auscultation without rales, wheezing or rhonchi  ABDOMEN: Soft, non-tender, non-distended MUSCULOSKELETAL:  No edema; No deformity  SKIN: Warm and dry NEUROLOGIC:  Alert and oriented x 3 PSYCHIATRIC:  Normal affect   ASSESSMENT:    1. Coronary artery disease involving native coronary artery of native heart with angina pectoris ([place])   2. Essential hypertension   3. Moderate aortic stenosis   4. Mixed hyperlipidemia    PLAN:    In order of problems listed above:  The patient has CCS functional class II symptoms of angina.  I reviewed his medications and he is on multidrug antianginal therapy with amlodipine, isosorbide, and metoprolol.  I am going to add Ranexa 500 mg twice daily.  I will see him back in 6 months.  We discussed an exercise program.  He will notify us if his symptoms progress.  He would like to go back to isosorbide 120 mg daily as the increased dose has not improved his symptoms. Blood pressure well controlled on multidrug therapy.  No changes made today. I personally reviewed his echo images which I think confirm moderate aortic stenosis.  His LVEF is 65 to 70%.  All of his aortic valve indices are in the moderate aortic stenosis range with a mean transvalvular gradient of 24 mmHg, maximum velocity of 3.3 m/s, dimensionless index of 0.29, and stroke-volume index of 50.  With continued symptoms, he will require continued close surveillance.  Again, he will be seen back in clinic in 6 months. Treated with rosuvastatin 20 mg daily.  Last lipids with an LDL cholesterol of 39 mg/dL.           Medication Adjustments/Labs and Tests Ordered: Current medicines are reviewed at length with the patient today.  Concerns regarding medicines  are outlined above.  Orders Placed This Encounter  Procedures   EKG 12-Lead   Meds ordered this  encounter  Medications   ranolazine (RANEXA) 500 MG 12 hr tablet    Sig: Take 1 tablet (500 mg total) by mouth 2 (two) times daily.    Dispense:  180 tablet    Refill:  3    Patient Instructions  Medication Instructions:  START Ranexa 518m twice daily  *If you need a refill on your cardiac medications before your next appointment, please call your pharmacy*   Lab Work: NONE If you have labs (blood work) drawn today and your tests are completely normal, you will receive your results only by: [place](if you have [place]Chart) OR A paper copy in the mail If you have any lab test that is abnormal or we need to change your treatment, we will call you to review the results.   Testing/Procedures: NONE   Follow-Up: [facility] and your [facility] needs are our priority.  As part of our continuing mission to provide you with exceptional heart care, we have created designated Provider Care Teams.  These Care Teams include your primary Cardiologist (physician) and Advanced Practice Providers (APPs -  Physician Assistants and Nurse Practitioners) who all work together to provide you with the care you need, when you need it.  We recommend signing up for the patient portal called "MyChart".  Sign up information is provided on this After Visit Summary.  MyChart is used to connect with patients for Virtual Visits (Telemedicine).  Patients are able to view lab/test results, encounter notes, upcoming appointments, etc.  Non-urgent messages can be sent to your provider as well.   To learn more about what you can do with MyChart, go to [email or web address]    Your next appointment:   6 month(s)  The format for your next appointment:   In Person  Provider:   MSherren Mocha MD       Important Information About Sugar         Signed, MSherren Mocha MD  03/04/2022 10:36 AM     [place]

## 2022-03-09 DIAGNOSIS — R3912 Poor urinary stream: Secondary | ICD-10-CM | POA: Diagnosis not present

## 2022-03-09 DIAGNOSIS — N401 Enlarged prostate with lower urinary tract symptoms: Secondary | ICD-10-CM | POA: Diagnosis not present

## 2022-03-09 DIAGNOSIS — R972 Elevated prostate specific antigen [PSA]: Secondary | ICD-10-CM | POA: Diagnosis not present

## 2022-03-11 DIAGNOSIS — E785 Hyperlipidemia, unspecified: Secondary | ICD-10-CM | POA: Diagnosis not present

## 2022-03-11 DIAGNOSIS — E1159 Type 2 diabetes mellitus with other circulatory complications: Secondary | ICD-10-CM | POA: Diagnosis not present

## 2022-03-11 DIAGNOSIS — Z7985 Long-term (current) use of injectable non-insulin antidiabetic drugs: Secondary | ICD-10-CM | POA: Diagnosis not present

## 2022-03-11 DIAGNOSIS — Z7984 Long term (current) use of oral hypoglycemic drugs: Secondary | ICD-10-CM | POA: Diagnosis not present

## 2022-03-11 DIAGNOSIS — I10 Essential (primary) hypertension: Secondary | ICD-10-CM | POA: Diagnosis not present

## 2022-03-11 DIAGNOSIS — Z951 Presence of aortocoronary bypass graft: Secondary | ICD-10-CM | POA: Diagnosis not present

## 2022-03-11 DIAGNOSIS — Z794 Long term (current) use of insulin: Secondary | ICD-10-CM | POA: Diagnosis not present

## 2022-03-21 ENCOUNTER — Other Ambulatory Visit: Payer: Self-pay | Admitting: Cardiovascular Disease

## 2022-03-30 ENCOUNTER — Encounter (HOSPITAL_COMMUNITY): Payer: Self-pay

## 2022-03-30 ENCOUNTER — Inpatient Hospital Stay (HOSPITAL_COMMUNITY)
Admission: EM | Admit: 2022-03-30 | Discharge: 2022-04-01 | DRG: 251 | Disposition: A | Discharge status: Home / Self Care | Payer: Medicare Other | Attending: Cardiology | Admitting: Cardiology

## 2022-03-30 ENCOUNTER — Emergency Department (HOSPITAL_COMMUNITY): Payer: Medicare Other

## 2022-03-30 ENCOUNTER — Other Ambulatory Visit: Payer: Self-pay

## 2022-03-30 DIAGNOSIS — I77810 Thoracic aortic ectasia: Secondary | ICD-10-CM | POA: Diagnosis not present

## 2022-03-30 DIAGNOSIS — I214 Non-ST elevation (NSTEMI) myocardial infarction: Principal | ICD-10-CM | POA: Diagnosis present

## 2022-03-30 DIAGNOSIS — R079 Chest pain, unspecified: Secondary | ICD-10-CM | POA: Diagnosis not present

## 2022-03-30 DIAGNOSIS — Z955 Presence of coronary angioplasty implant and graft: Secondary | ICD-10-CM | POA: Diagnosis not present

## 2022-03-30 DIAGNOSIS — I739 Peripheral vascular disease, unspecified: Secondary | ICD-10-CM | POA: Diagnosis not present

## 2022-03-30 DIAGNOSIS — I2582 Chronic total occlusion of coronary artery: Secondary | ICD-10-CM | POA: Diagnosis present

## 2022-03-30 DIAGNOSIS — I25710 Atherosclerosis of autologous vein coronary artery bypass graft(s) with unstable angina pectoris: Secondary | ICD-10-CM | POA: Diagnosis not present

## 2022-03-30 DIAGNOSIS — Z8249 Family history of ischemic heart disease and other diseases of the circulatory system: Secondary | ICD-10-CM | POA: Diagnosis not present

## 2022-03-30 DIAGNOSIS — E1151 Type 2 diabetes mellitus with diabetic peripheral angiopathy without gangrene: Secondary | ICD-10-CM | POA: Diagnosis present

## 2022-03-30 DIAGNOSIS — I350 Nonrheumatic aortic (valve) stenosis: Secondary | ICD-10-CM | POA: Diagnosis not present

## 2022-03-30 DIAGNOSIS — I25810 Atherosclerosis of coronary artery bypass graft(s) without angina pectoris: Secondary | ICD-10-CM | POA: Diagnosis present

## 2022-03-30 DIAGNOSIS — I10 Essential (primary) hypertension: Secondary | ICD-10-CM | POA: Diagnosis not present

## 2022-03-30 DIAGNOSIS — Z7902 Long term (current) use of antithrombotics/antiplatelets: Secondary | ICD-10-CM

## 2022-03-30 DIAGNOSIS — I25110 Atherosclerotic heart disease of native coronary artery with unstable angina pectoris: Secondary | ICD-10-CM | POA: Diagnosis not present

## 2022-03-30 DIAGNOSIS — E1159 Type 2 diabetes mellitus with other circulatory complications: Secondary | ICD-10-CM | POA: Diagnosis not present

## 2022-03-30 DIAGNOSIS — Z794 Long term (current) use of insulin: Secondary | ICD-10-CM

## 2022-03-30 DIAGNOSIS — E785 Hyperlipidemia, unspecified: Secondary | ICD-10-CM | POA: Diagnosis present

## 2022-03-30 DIAGNOSIS — F32A Depression, unspecified: Secondary | ICD-10-CM | POA: Diagnosis not present

## 2022-03-30 DIAGNOSIS — I25700 Atherosclerosis of coronary artery bypass graft(s), unspecified, with unstable angina pectoris: Secondary | ICD-10-CM | POA: Diagnosis not present

## 2022-03-30 DIAGNOSIS — Z7984 Long term (current) use of oral hypoglycemic drugs: Secondary | ICD-10-CM

## 2022-03-30 DIAGNOSIS — Z888 Allergy status to other drugs, medicaments and biological substances status: Secondary | ICD-10-CM | POA: Diagnosis not present

## 2022-03-30 DIAGNOSIS — Z79899 Other long term (current) drug therapy: Secondary | ICD-10-CM

## 2022-03-30 DIAGNOSIS — Z7982 Long term (current) use of aspirin: Secondary | ICD-10-CM

## 2022-03-30 DIAGNOSIS — Z9049 Acquired absence of other specified parts of digestive tract: Secondary | ICD-10-CM

## 2022-03-30 DIAGNOSIS — E1165 Type 2 diabetes mellitus with hyperglycemia: Secondary | ICD-10-CM | POA: Diagnosis present

## 2022-03-30 DIAGNOSIS — Z7985 Long-term (current) use of injectable non-insulin antidiabetic drugs: Secondary | ICD-10-CM

## 2022-03-30 DIAGNOSIS — R0789 Other chest pain: Secondary | ICD-10-CM | POA: Diagnosis not present

## 2022-03-30 DIAGNOSIS — I152 Hypertension secondary to endocrine disorders: Secondary | ICD-10-CM | POA: Diagnosis not present

## 2022-03-30 LAB — CBC WITH DIFFERENTIAL/PLATELET
Abs Immature Granulocytes: 0.03 10*3/uL (ref 0.00–0.07)
Basophils Absolute: 0 10*3/uL (ref 0.0–0.1)
Basophils Relative: 1 %
Eosinophils Absolute: 0.4 10*3/uL (ref 0.0–0.5)
Eosinophils Relative: 5 %
HCT: 37 % — ABNORMAL LOW (ref 39.0–52.0)
Hemoglobin: 12.1 g/dL — ABNORMAL LOW (ref 13.0–17.0)
Immature Granulocytes: 0 %
Lymphocytes Relative: 29 %
Lymphs Abs: 2 10*3/uL (ref 0.7–4.0)
MCH: 29.5 pg (ref 26.0–34.0)
MCHC: 32.7 g/dL (ref 30.0–36.0)
MCV: 90.2 fL (ref 80.0–100.0)
Monocytes Absolute: 0.8 10*3/uL (ref 0.1–1.0)
Monocytes Relative: 11 %
Neutro Abs: 3.8 10*3/uL (ref 1.7–7.7)
Neutrophils Relative %: 54 %
Platelets: 207 10*3/uL (ref 150–400)
RBC: 4.1 MIL/uL — ABNORMAL LOW (ref 4.22–5.81)
RDW: 13.5 % (ref 11.5–15.5)
WBC: 7 10*3/uL (ref 4.0–10.5)
nRBC: 0 % (ref 0.0–0.2)

## 2022-03-30 LAB — COMPREHENSIVE METABOLIC PANEL
ALT: 24 U/L (ref 0–44)
AST: 23 U/L (ref 15–41)
Albumin: 3.7 g/dL (ref 3.5–5.0)
Alkaline Phosphatase: 47 U/L (ref 38–126)
Anion gap: 11 (ref 5–15)
BUN: 19 mg/dL (ref 8–23)
CO2: 20 mmol/L — ABNORMAL LOW (ref 22–32)
Calcium: 9.4 mg/dL (ref 8.9–10.3)
Chloride: 108 mmol/L (ref 98–111)
Creatinine, Ser: 1.24 mg/dL (ref 0.61–1.24)
GFR, Estimated: 60 mL/min — ABNORMAL LOW (ref >60)
Glucose, Bld: 171 mg/dL — ABNORMAL HIGH (ref 70–99)
Potassium: 3.9 mmol/L (ref 3.5–5.1)
Sodium: 139 mmol/L (ref 135–145)
Total Bilirubin: 0.6 mg/dL (ref 0.3–1.2)
Total Protein: 6.4 g/dL — ABNORMAL LOW (ref 6.5–8.1)

## 2022-03-30 LAB — TROPONIN I (HIGH SENSITIVITY): Troponin I (High Sensitivity): 24 ng/L — ABNORMAL HIGH (ref <18)

## 2022-03-30 NOTE — ED Provider Triage Note (Signed)
Emergency Medicine Provider Triage Evaluation Note  [name] [name] , a 76 y.o. male  was evaluated in triage.  Pt complains of chest pain.  Onset yesterday however was intermittent until today.  20 minutes prior to EMS activation he reports significant chest pressure associated with shortness of breath and lightheadedness.  In route he has received multiple rounds of nitro and aspirin.  Patient currently is chest pain-free.  However with any amount of exertion patient becomes symptomatic.  He does have history of CAD and is status post CABG as well as PCI.  Review of Systems  Positive: As above Negative: As above  Physical Exam  There were no vitals taken for this visit. Gen:   Awake, no distress   Resp:  Normal effort  MSK:   Moves extremities without difficulty  Other:    Medical Decision Making  Medically screening exam initiated at 9:50 PM.  Appropriate orders placed.  [name] [name] was informed that the remainder of the evaluation will be completed by another provider, this initial triage assessment does not replace that evaluation, and the importance of remaining in the ED until their evaluation is complete.     Evlyn Courier, PA-C 03/30/22 2158

## 2022-03-30 NOTE — ED Triage Notes (Signed)
PER EMS: pt is from home with c/o CP onset yesterday. He called his PCP who told him to take his nitro. He reports weakness and diaphoresis with position changes and any exertion. Significant cardiac history.  BP- 144/74 after 2 SL nitro, HR-90s, 96% RA 18g LAC

## 2022-03-31 ENCOUNTER — Encounter (HOSPITAL_COMMUNITY): Payer: Self-pay | Admitting: Cardiology

## 2022-03-31 DIAGNOSIS — Z888 Allergy status to other drugs, medicaments and biological substances status: Secondary | ICD-10-CM | POA: Diagnosis not present

## 2022-03-31 DIAGNOSIS — Z8249 Family history of ischemic heart disease and other diseases of the circulatory system: Secondary | ICD-10-CM | POA: Diagnosis not present

## 2022-03-31 DIAGNOSIS — E1151 Type 2 diabetes mellitus with diabetic peripheral angiopathy without gangrene: Secondary | ICD-10-CM | POA: Diagnosis not present

## 2022-03-31 DIAGNOSIS — E1165 Type 2 diabetes mellitus with hyperglycemia: Secondary | ICD-10-CM | POA: Diagnosis not present

## 2022-03-31 DIAGNOSIS — R079 Chest pain, unspecified: Secondary | ICD-10-CM | POA: Diagnosis present

## 2022-03-31 DIAGNOSIS — I350 Nonrheumatic aortic (valve) stenosis: Secondary | ICD-10-CM | POA: Diagnosis not present

## 2022-03-31 DIAGNOSIS — Z79899 Other long term (current) drug therapy: Secondary | ICD-10-CM | POA: Diagnosis not present

## 2022-03-31 DIAGNOSIS — I739 Peripheral vascular disease, unspecified: Secondary | ICD-10-CM | POA: Diagnosis not present

## 2022-03-31 DIAGNOSIS — Z955 Presence of coronary angioplasty implant and graft: Secondary | ICD-10-CM | POA: Diagnosis not present

## 2022-03-31 DIAGNOSIS — I25700 Atherosclerosis of coronary artery bypass graft(s), unspecified, with unstable angina pectoris: Secondary | ICD-10-CM

## 2022-03-31 DIAGNOSIS — Z7982 Long term (current) use of aspirin: Secondary | ICD-10-CM | POA: Diagnosis not present

## 2022-03-31 DIAGNOSIS — I214 Non-ST elevation (NSTEMI) myocardial infarction: Secondary | ICD-10-CM | POA: Diagnosis not present

## 2022-03-31 DIAGNOSIS — I25710 Atherosclerosis of autologous vein coronary artery bypass graft(s) with unstable angina pectoris: Secondary | ICD-10-CM | POA: Diagnosis not present

## 2022-03-31 DIAGNOSIS — I10 Essential (primary) hypertension: Secondary | ICD-10-CM | POA: Diagnosis not present

## 2022-03-31 DIAGNOSIS — Z9049 Acquired absence of other specified parts of digestive tract: Secondary | ICD-10-CM | POA: Diagnosis not present

## 2022-03-31 DIAGNOSIS — I77810 Thoracic aortic ectasia: Secondary | ICD-10-CM | POA: Diagnosis not present

## 2022-03-31 DIAGNOSIS — E1159 Type 2 diabetes mellitus with other circulatory complications: Secondary | ICD-10-CM | POA: Diagnosis not present

## 2022-03-31 DIAGNOSIS — Z7902 Long term (current) use of antithrombotics/antiplatelets: Secondary | ICD-10-CM | POA: Diagnosis not present

## 2022-03-31 DIAGNOSIS — I2582 Chronic total occlusion of coronary artery: Secondary | ICD-10-CM | POA: Diagnosis not present

## 2022-03-31 DIAGNOSIS — Z7985 Long-term (current) use of injectable non-insulin antidiabetic drugs: Secondary | ICD-10-CM | POA: Diagnosis not present

## 2022-03-31 DIAGNOSIS — Z7984 Long term (current) use of oral hypoglycemic drugs: Secondary | ICD-10-CM | POA: Diagnosis not present

## 2022-03-31 DIAGNOSIS — I152 Hypertension secondary to endocrine disorders: Secondary | ICD-10-CM

## 2022-03-31 DIAGNOSIS — I25110 Atherosclerotic heart disease of native coronary artery with unstable angina pectoris: Secondary | ICD-10-CM | POA: Diagnosis not present

## 2022-03-31 DIAGNOSIS — Z794 Long term (current) use of insulin: Secondary | ICD-10-CM | POA: Diagnosis not present

## 2022-03-31 LAB — BASIC METABOLIC PANEL
Anion gap: 12 (ref 5–15)
BUN: 15 mg/dL (ref 8–23)
CO2: 21 mmol/L — ABNORMAL LOW (ref 22–32)
Calcium: 9.2 mg/dL (ref 8.9–10.3)
Chloride: 106 mmol/L (ref 98–111)
Creatinine, Ser: 1.13 mg/dL (ref 0.61–1.24)
GFR, Estimated: >60 mL/min (ref >60)
Glucose, Bld: 129 mg/dL — ABNORMAL HIGH (ref 70–99)
Potassium: 3.7 mmol/L (ref 3.5–5.1)
Sodium: 139 mmol/L (ref 135–145)

## 2022-03-31 LAB — CBG MONITORING, ED
Glucose-Capillary: 181 mg/dL — ABNORMAL HIGH (ref 70–99)
Glucose-Capillary: 140 mg/dL — ABNORMAL HIGH (ref 70–99)

## 2022-03-31 LAB — CBC
HCT: 35.6 % — ABNORMAL LOW (ref 39.0–52.0)
Hemoglobin: 12 g/dL — ABNORMAL LOW (ref 13.0–17.0)
MCH: 29.9 pg (ref 26.0–34.0)
MCHC: 33.7 g/dL (ref 30.0–36.0)
MCV: 88.6 fL (ref 80.0–100.0)
Platelets: 186 10*3/uL (ref 150–400)
RBC: 4.02 MIL/uL — ABNORMAL LOW (ref 4.22–5.81)
RDW: 13.5 % (ref 11.5–15.5)
WBC: 5.7 10*3/uL (ref 4.0–10.5)
nRBC: 0 % (ref 0.0–0.2)

## 2022-03-31 LAB — TROPONIN I (HIGH SENSITIVITY): Troponin I (High Sensitivity): 41 ng/L — ABNORMAL HIGH (ref <18)

## 2022-03-31 LAB — HEPARIN LEVEL (UNFRACTIONATED)
Heparin Unfractionated: <0.1 IU/mL — ABNORMAL LOW (ref 0.30–0.70)
Heparin Unfractionated: 0.38 IU/mL (ref 0.30–0.70)

## 2022-03-31 LAB — GLUCOSE, CAPILLARY
Glucose-Capillary: 113 mg/dL — ABNORMAL HIGH (ref 70–99)
Glucose-Capillary: 188 mg/dL — ABNORMAL HIGH (ref 70–99)

## 2022-03-31 MED ORDER — PHENAZOPYRIDINE HCL 100 MG PO TABS: 100.0000 mg | ORAL_TABLET | Freq: Two times a day (BID) | ORAL | Status: DC | PRN

## 2022-03-31 MED ORDER — RANOLAZINE ER 500 MG PO TB12
500.0000 mg | ORAL_TABLET | Freq: Two times a day (BID) | ORAL | Status: DC
Start: 2022-03-31 — End: 2022-03-31
  Filled 2022-03-31: qty 1

## 2022-03-31 MED ORDER — ASPIRIN 81 MG PO TBEC
81.0000 mg | DELAYED_RELEASE_TABLET | Freq: Every day | ORAL | Status: DC
  Administered 2022-04-01: 81 mg via ORAL
  Filled 2022-03-31 (×2): qty 1

## 2022-03-31 MED ORDER — SODIUM CHLORIDE 0.9% FLUSH: 3.0000 mL | Freq: Two times a day (BID) | INTRAVENOUS | Status: DC

## 2022-03-31 MED ORDER — SODIUM CHLORIDE 0.9% FLUSH: 3.0000 mL | INTRAVENOUS | Status: DC | PRN

## 2022-03-31 MED ORDER — METOPROLOL TARTRATE 50 MG PO TABS
50.0000 mg | ORAL_TABLET | Freq: Two times a day (BID) | ORAL | Status: DC
  Administered 2022-03-31 (×2): 50 mg via ORAL
  Filled 2022-03-31: qty 1
  Filled 2022-03-31: qty 2

## 2022-03-31 MED ORDER — SODIUM CHLORIDE 0.9 % WEIGHT BASED INFUSION
1.0000 mL/kg/h | INTRAVENOUS | Status: DC
  Administered 2022-04-01: 1 mL/kg/h via INTRAVENOUS

## 2022-03-31 MED ORDER — INSULIN ASPART 100 UNIT/ML IJ SOLN
0.0000 [IU] | Freq: Three times a day (TID) | INTRAMUSCULAR | Status: DC
  Administered 2022-03-31: 2 [IU] via SUBCUTANEOUS
  Administered 2022-03-31: 3 [IU] via SUBCUTANEOUS
  Administered 2022-04-01: 5 [IU] via SUBCUTANEOUS

## 2022-03-31 MED ORDER — CLOPIDOGREL BISULFATE 75 MG PO TABS
75.0000 mg | ORAL_TABLET | Freq: Every day | ORAL | Status: DC
  Administered 2022-03-31 – 2022-04-01 (×2): 75 mg via ORAL
  Filled 2022-03-31 (×2): qty 1

## 2022-03-31 MED ORDER — ROSUVASTATIN CALCIUM 20 MG PO TABS
20.0000 mg | ORAL_TABLET | Freq: Every day | ORAL | Status: DC
  Administered 2022-03-31: 20 mg via ORAL
  Filled 2022-03-31: qty 1

## 2022-03-31 MED ORDER — SODIUM CHLORIDE 0.9 % WEIGHT BASED INFUSION
3.0000 mL/kg/h | INTRAVENOUS | Status: DC
  Administered 2022-04-01: 3 mL/kg/h via INTRAVENOUS

## 2022-03-31 MED ORDER — NITROGLYCERIN 0.4 MG SL SUBL: 0.4000 mg | SUBLINGUAL_TABLET | SUBLINGUAL | Status: DC | PRN

## 2022-03-31 MED ORDER — ISOSORBIDE MONONITRATE ER 60 MG PO TB24
120.0000 mg | ORAL_TABLET | Freq: Every day | ORAL | Status: DC
  Administered 2022-03-31: 120 mg via ORAL
  Filled 2022-03-31: qty 4

## 2022-03-31 MED ORDER — ONDANSETRON HCL 4 MG/2ML IJ SOLN: 4.0000 mg | Freq: Four times a day (QID) | INTRAMUSCULAR | Status: DC | PRN

## 2022-03-31 MED ORDER — RANOLAZINE ER 500 MG PO TB12
1000.0000 mg | ORAL_TABLET | Freq: Two times a day (BID) | ORAL | Status: DC
  Administered 2022-03-31 (×2): 1000 mg via ORAL
  Filled 2022-03-31: qty 4
  Filled 2022-03-31: qty 2

## 2022-03-31 MED ORDER — SODIUM CHLORIDE 0.9% FLUSH
3.0000 mL | Freq: Two times a day (BID) | INTRAVENOUS | Status: DC
  Administered 2022-03-31: 3 mL via INTRAVENOUS

## 2022-03-31 MED ORDER — PHENAZOPYRIDINE HCL 100 MG PO TABS: 200.0000 mg | ORAL_TABLET | Freq: Three times a day (TID) | ORAL | Status: DC

## 2022-03-31 MED ORDER — ACETAMINOPHEN 325 MG PO TABS: 650.0000 mg | ORAL_TABLET | ORAL | Status: DC | PRN

## 2022-03-31 MED ORDER — ASPIRIN 81 MG PO CHEW: 324.0000 mg | CHEWABLE_TABLET | ORAL | Status: DC

## 2022-03-31 MED ORDER — HEPARIN BOLUS VIA INFUSION
2500.0000 [IU] | Freq: Once | INTRAVENOUS | Status: AC
  Administered 2022-03-31: 2500 [IU] via INTRAVENOUS
  Filled 2022-03-31: qty 2500

## 2022-03-31 MED ORDER — HEPARIN BOLUS VIA INFUSION
4000.0000 [IU] | Freq: Once | INTRAVENOUS | Status: AC
  Administered 2022-03-31: 4000 [IU] via INTRAVENOUS
  Filled 2022-03-31: qty 4000

## 2022-03-31 MED ORDER — ASPIRIN 81 MG PO CHEW: 81.0000 mg | CHEWABLE_TABLET | ORAL | Status: DC

## 2022-03-31 MED ORDER — VITAMIN B-12 1000 MCG PO TABS
1000.0000 ug | ORAL_TABLET | Freq: Every day | ORAL | Status: DC
  Administered 2022-03-31: 1000 ug via ORAL
  Filled 2022-03-31: qty 1

## 2022-03-31 MED ORDER — SODIUM CHLORIDE 0.9 % IV SOLN: 250.0000 mL | INTRAVENOUS | Status: DC | PRN

## 2022-03-31 MED ORDER — HEPARIN (PORCINE) 25000 UT/250ML-% IV SOLN
1300.0000 [IU]/h | INTRAVENOUS | Status: DC
  Administered 2022-03-31: 1000 [IU]/h via INTRAVENOUS
  Administered 2022-03-31: 1300 [IU]/h via INTRAVENOUS
  Filled 2022-03-31 (×2): qty 250

## 2022-03-31 MED ORDER — SODIUM CHLORIDE 0.9 % IV SOLN: INTRAVENOUS | Status: DC

## 2022-03-31 MED ORDER — ASPIRIN 81 MG PO CHEW
324.0000 mg | CHEWABLE_TABLET | Freq: Once | ORAL | Status: AC
  Administered 2022-03-31: 324 mg via ORAL
  Filled 2022-03-31: qty 4

## 2022-03-31 MED ORDER — ASPIRIN 81 MG PO TBEC: 81.0000 mg | DELAYED_RELEASE_TABLET | Freq: Every day | ORAL | Status: DC

## 2022-03-31 MED ORDER — INSULIN ASPART 100 UNIT/ML IJ SOLN: 0.0000 [IU] | Freq: Every day | INTRAMUSCULAR | Status: DC

## 2022-03-31 MED ORDER — ASPIRIN 300 MG RE SUPP: 300.0000 mg | RECTAL | Status: DC

## 2022-03-31 MED ORDER — ADULT MULTIVITAMIN W/MINERALS CH
1.0000 | ORAL_TABLET | Freq: Every day | ORAL | Status: DC
  Administered 2022-03-31: 1 via ORAL
  Filled 2022-03-31: qty 1

## 2022-03-31 MED ORDER — BENAZEPRIL HCL 20 MG PO TABS
40.0000 mg | ORAL_TABLET | Freq: Every day | ORAL | Status: DC
  Administered 2022-03-31: 40 mg via ORAL
  Filled 2022-03-31: qty 2
  Filled 2022-03-31: qty 1
  Filled 2022-03-31: qty 2

## 2022-03-31 MED ORDER — AMLODIPINE BESYLATE 10 MG PO TABS
10.0000 mg | ORAL_TABLET | Freq: Every day | ORAL | Status: DC
  Administered 2022-03-31: 10 mg via ORAL
  Filled 2022-03-31: qty 2

## 2022-03-31 NOTE — Progress Notes (Signed)
ANTICOAGULATION CONSULT NOTE - Initial Consult  Pharmacy Consult for Heparin  Indication: chest pain/ACS  Allergies  Allergen Reactions   Empagliflozin Other (See Comments)    Abdominal pain/dizziness     Vital Signs: Temp: 98.3 F (36.8 C) (06/14 2153) Temp Source: Oral (06/14 2153) BP: 145/81 (06/15 0245) Pulse Rate: 70 (06/15 0245)  Labs: Recent Labs    03/30/22 2157 03/31/22 0018 03/31/22 0433  HGB 12.1*  --  12.0*  HCT 37.0*  --  35.6*  PLT 207  --  186  CREATININE 1.24  --   --   TROPONINIHS 24* 41*  --     CrCl cannot be calculated (Unknown ideal weight.).   Medical History: Past Medical History:  Diagnosis Date   Aortic stenosis    Arrhythmia    Premature beats   Coronary atherosclerosis of artery bypass graft    CABG 2009, LHC 07/2016 restenosis of SVG to OM treated with DES   Diabetes mellitus ([place])    Essential hypertension, benign    Fatty liver    History of kidney stones    PAD (peripheral artery disease) ([place])    Pure hypercholesterolemia      Assessment: 76 y/o M with cardiac history presents to the ED with chest pain. Very mild elevation in troponin. History of CABG and LHC. Starting heparin. Hgb 12. Renal function ok. PTA meds reviewed.   Goal of Therapy:  Heparin level 0.3-0.7 units/ml Monitor platelets by anticoagulation protocol: Yes   Plan:  Heparin 4000 units BOLUS Start heparin drip at 1000 units/hr 1100 Heparin level Daily CBC/Heparin level Monitor for bleeding  Narda Bonds, PharmD, BCPS Clinical Pharmacist Phone: [phone]

## 2022-03-31 NOTE — ED Provider Notes (Signed)
[facility] EMERGENCY DEPARTMENT Provider Note   CSN: [id number] Arrival date & time: 03/30/22  2147     History  Chief Complaint  Patient presents with   Chest Pain    [name] [name] is a 76 y.o. male.  The history is provided by the patient and medical records.  Chest Pain [name] [name] is a 76 y.o. male who presents to the Emergency Department complaining of chest pain.  He has an extensive history of coronary artery disease status post CABG and stents here for evaluation of chest pain.  At baseline he has exertional chest pain and can walk more than 100 yards before developing chest pain and he can take nitroglycerin and resolves.  Yesterday he developed significant increase in his exertional chest pain and describes it as a slightly different pain from his baseline.  He states that this is in his central upper chest and describes it hurting in nature.  The pain occurs with minimal ambulation and requires rest as well as nitro to resolve.  Today he also had associated diaphoresis.  No associated fever, cough, abdominal pain, additional symptoms. He did have Ranexa added to his Imdur 1 month ago when he saw cardiology for his persistent angina.     Home Medications Prior to Admission medications   Medication Sig Start Date End Date Taking? Authorizing Provider  amLODipine (NORVASC) 10 MG tablet TAKE 1 TABLET BY MOUTH EVERY DAY Patient taking differently: Take 10 mg by mouth daily. 12/10/21  Yes Sherren Mocha, MD  aspirin 81 MG EC tablet TAKE 1 TABLET BY MOUTH EVERY DAY Patient taking differently: Take 81 mg by mouth daily. 02/23/21  Yes Sherren Mocha, MD  benazepril (LOTENSIN) 40 MG tablet Take 40 mg by mouth daily.  04/30/12  Yes [provider]  cholecalciferol (VITAMIN D3) 25 MCG (1000 UNIT) tablet Take 1,000 Units by mouth daily.   Yes [provider]  clopidogrel (PLAVIX) 75 MG tablet TAKE 1 TABLET BY MOUTH EVERY DAY Patient taking  differently: Take 75 mg by mouth daily. 01/17/22  Yes Sherren Mocha, MD  ELDERBERRY PO Take 1 tablet by mouth daily.   Yes [provider]  finasteride (PROSCAR) 5 MG tablet Take 5 mg by mouth daily. 03/06/22  Yes [provider]  hydrochlorothiazide (HYDRODIURIL) 25 MG tablet TAKE 1 TABLET BY MOUTH EVERY DAY Patient taking differently: Take 25 mg by mouth daily. 09/24/21  Yes Sherren Mocha, MD  insulin NPH Human (NOVOLIN N) 100 UNIT/ML injection Inject 15 Units into the skin 2 (two) times daily before a meal.   Yes [provider]  isosorbide mononitrate (IMDUR) 120 MG 24 hr tablet Take 1 tablet (120 mg total) by mouth daily. 11/29/21  Yes Deberah Pelton, NP  metFORMIN (GLUCOPHAGE) 1000 MG tablet Take 1,000 mg by mouth 2 (two) times daily with a meal. 05/21/19  Yes [provider]  metoprolol tartrate (LOPRESSOR) 50 MG tablet Take 1 tablet (50 mg total) by mouth 2 (two) times daily. 11/29/21  Yes Cleaver, Jossie Ng, NP  Multiple Vitamin (MULTIVITAMIN WITH MINERALS) TABS tablet Take 1 tablet by mouth daily.   Yes [provider]  nitroGLYCERIN (NITROSTAT) 0.4 MG SL tablet PLACE 1 TABLET UNDER THE TONGUE EVERY 5 MINUTES AS NEEDED FOR CHEST PAIN. Patient taking differently: Place 0.4 mg under the tongue every 5 (five) minutes as needed for chest pain. 11/16/21  Yes Sherren Mocha, MD  ranolazine (RANEXA) 500 MG 12 hr tablet Take 1  tablet (500 mg total) by mouth 2 (two) times daily. 03/04/22  Yes Sherren Mocha, MD  rosuvastatin (CRESTOR) 20 MG tablet TAKE 1 TABLET BY MOUTH EVERY DAY Patient taking differently: Take 20 mg by mouth daily. 03/21/22  Yes Sherren Mocha, MD  Zinc Gluconate 100 MG TABS Take 1 tablet by mouth daily.   Yes [provider]  ezetimibe (ZETIA) 10 MG tablet Take 1 tablet (10 mg total) by mouth daily. Patient not taking: Reported on 03/31/2022 01/24/20 03/04/22  Verta Ellen., NP  glucose blood (PRECISION QID TEST) test strip 1  each by Other route as directed. 08/03/20   [provider]  Insulin Pen Needle (B-D UF III MINI PEN NEEDLES) 31G X 5 MM MISC Use to inject Ozempic once a week 01/02/21   [provider]      Allergies    Empagliflozin    Review of Systems   Review of Systems  Cardiovascular:  Positive for chest pain.  All other systems reviewed and are negative.   Physical Exam Updated Vital Signs BP 140/80   Pulse 67   Temp 98.3 F (36.8 C) (Oral)   Resp 15   SpO2 97%  Physical Exam Vitals and nursing note reviewed.  Constitutional:      Appearance: He is well-developed.  HENT:     Head: Normocephalic and atraumatic.  Cardiovascular:     Rate and Rhythm: Normal rate and regular rhythm.     Heart sounds: Murmur heard.  Pulmonary:     Effort: Pulmonary effort is normal. No respiratory distress.     Breath sounds: Normal breath sounds.  Abdominal:     Palpations: Abdomen is soft.     Tenderness: There is no abdominal tenderness. There is no guarding or rebound.  Musculoskeletal:        General: No swelling or tenderness.  Skin:    General: Skin is warm and dry.  Neurological:     Mental Status: He is alert and oriented to person, place, and time.  Psychiatric:        Behavior: Behavior normal.     ED Results / Procedures / Treatments   Labs (all labs ordered are listed, but only abnormal results are displayed) Labs Reviewed  CBC WITH DIFFERENTIAL/PLATELET - Abnormal; Notable for the following components:      Result Value   RBC 4.10 (*)    Hemoglobin 12.1 (*)    HCT 37.0 (*)    All other components within normal limits  COMPREHENSIVE METABOLIC PANEL - Abnormal; Notable for the following components:   CO2 20 (*)    Glucose, Bld 171 (*)    Total Protein 6.4 (*)    GFR, Estimated 60 (*)    All other components within normal limits  BASIC METABOLIC PANEL - Abnormal; Notable for the following components:   CO2 21 (*)    Glucose, Bld 129 (*)    All other  components within normal limits  CBC - Abnormal; Notable for the following components:   RBC 4.02 (*)    Hemoglobin 12.0 (*)    HCT 35.6 (*)    All other components within normal limits  TROPONIN I (HIGH SENSITIVITY) - Abnormal; Notable for the following components:   Troponin I (High Sensitivity) 24 (*)    All other components within normal limits  TROPONIN I (HIGH SENSITIVITY) - Abnormal; Notable for the following components:   Troponin I (High Sensitivity) 41 (*)    All other components  within normal limits  HEPARIN LEVEL (UNFRACTIONATED)    EKG EKG Interpretation  Date/Time:  Thursday March 31 2022 02:46:57 EDT Ventricular Rate:  73 PR Interval:  197 QRS Duration: 114 QT Interval:  442 QTC Calculation: 488 R Axis:   40 Text Interpretation: Sinus rhythm Incomplete right bundle branch block Borderline ST depression, lateral leads Borderline prolonged QT interval Confirmed by Quintella Reichert [phone]) on 03/31/2022 2:49:12 AM  Radiology DG Chest Portable 1 View  Result Date: 03/30/2022 CLINICAL DATA:  Chest pain EXAM: PORTABLE CHEST 1 VIEW COMPARISON:  None Available. FINDINGS: Lungs are well expanded, symmetric, and clear. No pneumothorax or pleural effusion. Cardiac size within normal limits. Coronary artery bypass grafting has been performed. Pulmonary vascularity is normal. Osseous structures are age-appropriate. No acute bone abnormality. IMPRESSION: No active disease. Electronically Signed   By: Fidela Salisbury M.D.   On: 03/30/2022 22:41    Procedures Procedures    Medications Ordered in ED Medications  heparin ADULT infusion 100 units/mL (25000 units/223m) (1,000 Units/hr Intravenous New Bag/Given 03/31/22 0309)  benazepril (LOTENSIN) tablet 40 mg (has no administration in time range)  multivitamin with minerals tablet 1 tablet (has no administration in time range)  aspirin EC tablet 81 mg (has no administration in time range)  nitroGLYCERIN (NITROSTAT) SL tablet 0.4 mg  (has no administration in time range)  vitamin B-12 (CYANOCOBALAMIN) tablet 1,000 mcg (has no administration in time range)  phenazopyridine (PYRIDIUM) tablet 100 mg (has no administration in time range)  metoprolol tartrate (LOPRESSOR) tablet 50 mg (has no administration in time range)  isosorbide mononitrate (IMDUR) 24 hr tablet 120 mg (has no administration in time range)  amLODipine (NORVASC) tablet 10 mg (has no administration in time range)  clopidogrel (PLAVIX) tablet 75 mg (has no administration in time range)  ranolazine (RANEXA) 12 hr tablet 500 mg (has no administration in time range)  rosuvastatin (CRESTOR) tablet 20 mg (has no administration in time range)  acetaminophen (TYLENOL) tablet 650 mg (has no administration in time range)  ondansetron (ZOFRAN) injection 4 mg (has no administration in time range)  insulin aspart (novoLOG) injection 0-15 Units (has no administration in time range)  insulin aspart (novoLOG) injection 0-5 Units (has no administration in time range)  aspirin chewable tablet 324 mg (324 mg Oral Given 03/31/22 0257)  heparin bolus via infusion 4,000 Units (4,000 Units Intravenous Bolus from Bag 03/31/22 0309)    ED Course/ Medical Decision Making/ A&P                           Medical Decision Making Risk OTC drugs. Decision regarding hospitalization.   Patient with history of coronary artery disease, aortic stenosis, hypertension here for evaluation of progressive exertional chest pain.  EKG with slightly more pronounced ST depressions when compared to baseline.  Initial troponin is mildly elevated, slight increase on second troponin.  He was treated with aspirin, heparin for concern for non-ST elevation MI.  Patient pain-free at time of assessment.  Cardiology consulted for admission for ongoing treatment.  Patient updated findings of studies and recommendation for admission and he is in agreement with treatment plan.  Presentation is not consistent with  dissection, PE, pneumonia.  Chest x-ray without pulmonary edema or pneumonia, images personally reviewed and interpreted.       Final Clinical Impression(s) / ED Diagnoses Final diagnoses:  [facility] (non-ST elevated myocardial infarction[facility]    Rx / DC Orders ED Discharge Orders  None         Quintella Reichert, MD 03/31/22 [phone]

## 2022-03-31 NOTE — H&P (Addendum)
Cardiology Admission History and Physical:   Patient ID: [name] [name] MRN: [id number]; DOB: 10/02/1946   Admission date: 03/30/2022  PCP:  [place] [place] [facility] [place] [facility] Cardiologist:  Sherren Mocha, MD        Chief Complaint:  chest pain   Patient Profile:   [name] [name] is a 76 y.o. male with hx of coronary and peripheral arterial disease, aortic stenosis, prior CABG and PCI procedures post ABG, PCI  (2020- stent to right PDA beyond vein graft insertion) , PCI (SVG-OM), right femoral endarterectomy who is being seen 03/31/2022 for the evaluation of chest pain.Marland Kitchen  History of Present Illness:   [name] [name] is a 76 y.o. male with hx of coronary and peripheral arterial disease, aortic stenosis, prior CABG and PCI procedures post ABG, PCI  (2020- stent to right PDA beyond vein graft insertion) , PCI (SVG-OM), right femoral endarterectomy who is being seen 03/31/2022 for the evaluation of chest pain..  The patient just saw Dr [name] on 03/04/22 for exertional angina and was on good medical therapy (imdur and ranexa), however, his angina kept getting worse with exertion as well as was present intermittently at rest, associated with SOB and lightheadedness since yesterday. He took multiple rounds of nitro and aspirin and presented to the ER.  Trop: 24->41 EKG: ST depression in inferior and lateral leads (slightly more pronounced than before)  ECHO: 02/2022 IMPRESSIONS     1. Left ventricular ejection fraction, by estimation, is 65 to 70%. Left  ventricular ejection fraction by 3D volume is 68 %. The left ventricle has  normal function. The left ventricle has no regional wall motion  abnormalities. Left ventricular diastolic   parameters are consistent with Grade I diastolic dysfunction (impaired  relaxation).   2. Right ventricular systolic function is normal. The right ventricular  size is normal. Tricuspid regurgitation signal is inadequate for  assessing  PA pressure.   3. The mitral valve is degenerative. No evidence of mitral valve  regurgitation. No evidence of mitral stenosis. Moderate mitral annular  calcification.   4. The aortic valve is calcified. There is severe calcifcation of the  aortic valve. There is severe thickening of the aortic valve. Aortic valve  regurgitation is trivial. Moderate aortic valve stenosis. Aortic valve  area, by VTI measures 1.56 cm.  Aortic valve mean gradient measures 24.2 mmHg. Aortic valve Vmax measures  3.29 m/s.   5. Aortic dilatation noted. There is borderline dilatation of the  ascending aorta, measuring 37 mm.   6. The inferior vena cava is normal in size with greater than 50%  respiratory variability, suggesting right atrial pressure of 3 mmHg.   7. Compared to study dated 08/09/2021, the mean AVG has increased from 20 to 32mhg, AV VMax has increased from 3.03 to 3.231m and DVI has decresed from 0.34 to 0.29.   [facility]      Ost LAD to Prox LAD lesion is 100% stenosed.   Dist LAD lesion is 70% stenosed.   Ost Cx to Prox Cx lesion is 99% stenosed.   Dist RCA lesion is 40% stenosed.   Prox RCA to Mid RCA lesion is 100% stenosed.   Ost RPDA to RPDA lesion is 40% stenosed.   Dist Graft to Insertion lesion is 75% stenosed.   Balloon angioplasty was performed using a BALLN Holcomb EUBorgerX 2.5X15.   Post intervention, there is a 20% residual stenosis.   SVG and is normal  in caliber.   SVG and is normal in caliber.   SVG and is normal in caliber.   LIMA and is normal in caliber.   There is moderate aortic valve stenosis.   There is no mitral valve stenosis.   1.  Severe native three-vessel coronary artery disease with total occlusion of the RCA and total occlusion of the left main. 2.  Status post aortocoronary bypass surgery with continued patency of the LIMA to LAD, saphenous vein graft to distal RCA, saphenous vein graft to diagonal, and saphenous vein graft to OM.   There is mild in-stent restenosis in the native PDA ostium estimated at 40%.  There is severe in-stent restenosis at the stented segment of the SVG to OM anastomosis, estimated at 75%. 3.  Successful balloon angioplasty of severe in-stent restenosis involving the SVG to obtuse marginal distal anastomosis, reducing 75% stenosis to 20%. 4.  Moderate aortic stenosis with mean gradient 33 mmHg   Recommend: Same-day PCI protocol as long as no early complications arise.  Ongoing medical therapy and surveillance of aortic stenosis.  If persistent lifestyle limiting symptoms, will need to consider redo CABG/aortic valve replacement versus TAVR and ongoing medical therapy.  If significant improvement in symptoms, we will continue with our current management of medical therapy and surveillance of aortic stenosis. Past Medical History:  Diagnosis Date   Aortic stenosis    Arrhythmia    Premature beats   Coronary atherosclerosis of artery bypass graft    CABG 2009, LHC 07/2016 restenosis of SVG to OM treated with DES   Diabetes mellitus ([place])    Essential hypertension, benign    Fatty liver    History of kidney stones    PAD (peripheral artery disease) ([place])    Pure hypercholesterolemia     Past Surgical History:  Procedure Laterality Date   ABDOMINAL AORTOGRAM W/LOWER EXTREMITY N/A 12/30/2020   Procedure: ABDOMINAL AORTOGRAM W/LOWER EXTREMITY;  Surgeon: Wellington Hampshire, MD;  Location: [facility];  Service: Cardiovascular;  Laterality: N/A;   ADENOIDECTOMY     CARDIAC CATHETERIZATION N/A 07/18/2016   Procedure: Left Heart Cath and Cors/Grafts Angiography;  Surgeon: Burnell Blanks, MD;  Location: [facility];  Service: Cardiovascular;  Laterality: N/A;   CARDIAC CATHETERIZATION N/A 07/18/2016   Procedure: Coronary Stent Intervention;  Surgeon: Burnell Blanks, MD;  Location: [facility];  Service: Cardiovascular;  Laterality: N/A;   CHOLECYSTECTOMY     CORONARY  ANGIOPLASTY WITH STENT PLACEMENT     CORONARY ARTERY BYPASS GRAFT     CORONARY BALLOON ANGIOPLASTY N/A 09/10/2021   Procedure: CORONARY BALLOON ANGIOPLASTY;  Surgeon: Sherren Mocha, MD;  Location: [facility];  Service: Cardiovascular;  Laterality: N/A;   CORONARY STENT INTERVENTION N/A 01/11/2019   Procedure: CORONARY STENT INTERVENTION;  Surgeon: Sherren Mocha, MD;  Location: [facility];  Service: Cardiovascular;  Laterality: N/A;   ENDARTERECTOMY FEMORAL Right 01/13/2021   Procedure: RIGHT FEMORAL ENDARTERECTOMY  WITH PATCH ANGIOPLASTY;  Surgeon: Rosetta Posner, MD;  Location: [facility];  Service: Vascular;  Laterality: Right;   INGUINAL HERNIA REPAIR Right    LEFT HEART CATH AND CORS/GRAFTS ANGIOGRAPHY N/A 01/11/2019   Procedure: LEFT HEART CATH AND CORS/GRAFTS ANGIOGRAPHY;  Surgeon: Sherren Mocha, MD;  Location: [facility];  Service: Cardiovascular;  Laterality: N/A;   LEFT HEART CATH AND CORS/GRAFTS ANGIOGRAPHY N/A 09/10/2021   Procedure: LEFT HEART CATH AND CORS/GRAFTS ANGIOGRAPHY;  Surgeon: Sherren Mocha, MD;  Location:  [facility];  Service: Cardiovascular;  Laterality: N/A;   Right femoral endarterectomy and Dacron patch angioplasty  01/13/3021   TONSILLECTOMY       Medications Prior to Admission: Prior to Admission medications   Medication Sig Start Date End Date Taking? Authorizing Provider  amLODipine (NORVASC) 10 MG tablet TAKE 1 TABLET BY MOUTH EVERY DAY 12/10/21   Sherren Mocha, MD  aspirin 81 MG EC tablet TAKE 1 TABLET BY MOUTH EVERY DAY 02/23/21   Sherren Mocha, MD  benazepril (LOTENSIN) 40 MG tablet Take 40 mg by mouth daily.  04/30/12   [provider]  clopidogrel (PLAVIX) 75 MG tablet TAKE 1 TABLET BY MOUTH EVERY DAY 01/17/22   Sherren Mocha, MD  cyanocobalamin 100 MCG tablet See admin instructions. Patient not taking: Reported on 03/04/2022    [provider]  ezetimibe (ZETIA) 10 MG tablet Take 1 tablet (10 mg total) by  mouth daily. 01/24/20 03/04/22  Verta Ellen., NP  glucose blood (PRECISION QID TEST) test strip 1 each by Other route as directed. 08/03/20   [provider]  hydrochlorothiazide (HYDRODIURIL) 25 MG tablet TAKE 1 TABLET BY MOUTH EVERY DAY 09/24/21   Sherren Mocha, MD  insulin NPH Human (NOVOLIN N) 100 UNIT/ML injection Inject 15 Units into the skin 2 (two) times daily before a meal.    [provider]  Insulin Pen Needle (B-D UF III MINI PEN NEEDLES) 31G X 5 MM MISC Use to inject Ozempic once a week 01/02/21   [provider]  isosorbide mononitrate (IMDUR) 120 MG 24 hr tablet Take 1 tablet (120 mg total) by mouth daily. 11/29/21   Deberah Pelton, NP  metFORMIN (GLUCOPHAGE) 1000 MG tablet Take 1,000 mg by mouth 2 (two) times daily with a meal. 05/21/19   [provider]  metFORMIN (GLUCOPHAGE) 500 MG tablet 2 tablets (1,000 mg total) 2 times daily with meals. Patient not taking: Reported on 03/04/2022 04/23/13   [provider]  metoprolol tartrate (LOPRESSOR) 50 MG tablet Take 1 tablet (50 mg total) by mouth 2 (two) times daily. 11/29/21   Deberah Pelton, NP  Multiple Vitamin (MULTIVITAMIN WITH MINERALS) TABS tablet Take 1 tablet by mouth daily.    [provider]  nitroGLYCERIN (NITROSTAT) 0.4 MG SL tablet PLACE 1 TABLET UNDER THE TONGUE EVERY 5 MINUTES AS NEEDED FOR CHEST PAIN. 11/16/21   Sherren Mocha, MD  OZEMPIC, 0.25 OR 0.5 MG/DOSE, 2 MG/1.5ML SOPN SMARTSIG:0.2 Milliliter(s) SUB-Q Once a Week 11/24/21   [provider]  Pediatric Multiple Vitamins (FLINTSTONES PLUS EXTRA C) CHEW Chew by mouth. Patient not taking: Reported on 03/04/2022    [provider]  phenazopyridine (PYRIDIUM) 100 MG tablet 2 tablets after meals Patient not taking: Reported on 03/04/2022 10/05/21   [provider]  phenazopyridine (PYRIDIUM) 100 MG tablet Take 200 mg by mouth 3 (three) times daily. Patient not taking: Reported on 03/04/2022  10/05/21   [provider]  ranolazine (RANEXA) 500 MG 12 hr tablet Take 1 tablet (500 mg total) by mouth 2 (two) times daily. 03/04/22   Sherren Mocha, MD  rosuvastatin (CRESTOR) 20 MG tablet TAKE 1 TABLET BY MOUTH EVERY DAY 03/21/22   Sherren Mocha, MD  ticagrelor (BRILINTA) 90 MG TABS tablet 1 tablet (90 mg total) 2 times daily. 12/28/16   [provider]  Zinc Gluconate 100 MG TABS 1 tablet    [provider]     Allergies:    Allergies  Allergen Reactions   Empagliflozin Other (See Comments)    Abdominal pain/dizziness    Social History:   Social History   Socioeconomic History   Marital status: Married    Spouse name: Not on file   Number of children: Not on file   Years of education: Not on file   Highest education level: Not on file  Occupational History   Not on file  Tobacco Use   Smoking status: Never   Smokeless tobacco: Never  Vaping Use   Vaping Use: Never used  Substance and Sexual Activity   Alcohol use: No   Drug use: No   Sexual activity: Not on file  Other Topics Concern   Not on file  Social History Narrative   Not on file   Social Determinants of Health   Financial Resource Strain: Not on file  Food Insecurity: Not on file  Transportation Needs: Not on file  Physical Activity: Not on file  Stress: Not on file  Social Connections: Not on file  Intimate Partner Violence: Not on file    Family History:   The patient's family history includes Heart disease in his mother. There is no history of Diabetes.    ROS:  Please see the history of present illness.  All other ROS reviewed and negative.     Physical Exam/Data:   Vitals:   03/31/22 0131 03/31/22 0223 03/31/22 0230 03/31/22 0245  BP: (!) 141/62 (!) 155/70 139/68 (!) 145/81  Pulse: (!) 53 71 66 70  Resp: 16 15 13 13   Temp:      TempSrc:      SpO2: 98% 99% 98% 98%   No intake or output data in the 24 hours ending 03/31/22 0308    03/04/2022   10:12 AM  11/29/2021    1:32 PM 10/01/2021    2:08 PM  Last 3 Weights  Weight (lbs) 198 lb 202 lb 12.8 oz 204 lb  Weight (kg) 89.812 kg 91.989 kg 92.534 kg     There is no height or weight on file to calculate BMI.  General:  Well nourished, well developed, in no acute distress HEENT: normal Neck: no JVD Vascular: No carotid bruits; Distal pulses 2+ bilaterally   Cardiac:  normal S1, S2; RRR; no murmur  Lungs:  clear to auscultation bilaterally, no wheezing, rhonchi or rales  Abd: soft, nontender, no hepatomegaly  Ext: no edema Musculoskeletal:  No deformities, BUE and BLE strength normal and equal Skin: warm and dry  Neuro:  CNs 2-12 intact, no focal abnormalities noted Psych:  Normal affect      Laboratory Data:  High Sensitivity Troponin:   Recent Labs  Lab 03/30/22 2157 03/31/22 0018  TROPONINIHS 24* 41*      Chemistry Recent Labs  Lab 03/30/22 2157  NA 139  K 3.9  CL 108  CO2 20*  GLUCOSE 171*  BUN 19  CREATININE 1.24  CALCIUM 9.4  GFRNONAA 60*  ANIONGAP 11    Recent Labs  Lab 03/30/22 2157  PROT 6.4*  ALBUMIN 3.7  AST 23  ALT 24  ALKPHOS 47  BILITOT 0.6   Lipids No results for input(s): "CHOL", "TRIG", "HDL", "LABVLDL", "LDLCALC", "CHOLHDL" in the last 168 hours. Hematology Recent Labs  Lab 03/30/22 2157  WBC 7.0  RBC 4.10*  HGB 12.1*  HCT 37.0*  MCV 90.2  MCH 29.5  MCHC 32.7  RDW 13.5  PLT 207   Thyroid No results for input(s): "TSH", "FREET4" in the  last 168 hours. BNPNo results for input(s): "BNP", "PROBNP" in the last 168 hours.  DDimer No results for input(s): "DDIMER" in the last 168 hours.   Radiology/Studies:  DG Chest Portable 1 View  Result Date: 03/30/2022 CLINICAL DATA:  Chest pain EXAM: PORTABLE CHEST 1 VIEW COMPARISON:  None Available. FINDINGS: Lungs are well expanded, symmetric, and clear. No pneumothorax or pleural effusion. Cardiac size within normal limits. Coronary artery bypass grafting has been performed. Pulmonary  vascularity is normal. Osseous structures are age-appropriate. No acute bone abnormality. IMPRESSION: No active disease. Electronically Signed   By: Fidela Salisbury M.D.   On: 03/30/2022 22:41     Assessment and Plan:   Progressive exertional chest pain CAD s/p CABG (LIMA to LAD, SVG-dRCA, SVG- Diag, SVG-OM) s/p PCI SVG-OM (Nov 22) Moderate aortic stenosis PAD s/p right femoral endarterectomy DM-2 HTN, HLD  Plan: - admit to Cardiology team, begin heparin gtt  - s/p aspirin, continue aspirin 63m plavix 767mdaily  - continue zetia 1046m rosuva 44m46mily, home medications: metoprolol, benzapreil. antiangianl: imdur 144mg22mly+ ranexa 10mg 71my  - insulin sliding scale for DM-2 - hold HCTZ - NPO for LHC/CA this am - continue other home meds  Full code  Risk Assessment/Risk Scores:    HEAR Score (for undifferentiated chest pain):          Severity of Illness: The appropriate patient status for this patient is INPATIENT. Inpatient status is judged to be reasonable and necessary in order to provide the required intensity of service to ensure the patient's safety. The patient's presenting symptoms, physical exam findings, and initial radiographic and laboratory data in the context of their chronic comorbidities is felt to place them at high risk for further clinical deterioration. Furthermore, it is not anticipated that the patient will be medically stable for discharge from the hospital within 2 midnights of admission.   * I certify that at the point of admission it is my clinical judgment that the patient will require inpatient hospital care spanning beyond 2 midnights from the point of admission due to high intensity of service, high risk for further deterioration and high frequency of surveillance required.*   For questions or updates, please contact [facility] consult [email or web address] for contact info under     Signed, Robin Renae Fickle6/15/2023 3:08 AM     Personally seen and examined. Agree with fellow above with the following comments:  Briefly  with a history of moderate aortic stenosis, CAD s/p LIMA-LAD, SVG-distal RCA (40% ISR), SVG-diagonal, SVG-OM s/p PICA and  POBA, HTN with DM,  PAD with prior R Fem enartectomy with prior who is on optimal medical therapy presenting with unstable angina  Patient notes that even when he was having chest pain even at recent follow up.  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  Earlier this week had chest pain with yard work.  Resting angina with diaphoresis led to ED evaluation. Presently not CP.  Has not has SOB.  Exam notable for systolic crescendo murmur.  +2 R and L fem, +2 left radial.  CTAB.  Rest as above.  Labs notable for hs trop 24-> 41 EKG SR with inferolateral ST depressions Tele: SR with PVCs  Would recommend  - LHC- Grafts; suspect SVG/OM or SVG PDA has worsened - we have limited options; will increase ranolazine to 1000 m, unable to further uptitrate metoprolol; he is on maximum dose IMDUR, BP  has been controlled - my suspicion is due to his level of disease he will be on life long DAPT (has had no bleeding issues) - continue home medication - heparin PPX-> Heparin SX post cath -diet NPO to diet carb consistent heart healthy post cath - Full Code - labs for 04/01/22 (Hgb stable)  Potentially 04/01/22 discharge if we are able to intervene and CP resolves  Risks and benefits of cardiac catheterization have been discussed with the patient.  These include bleeding, infection, kidney damage, stroke, heart attack, death.  The patient understands these risks and is willing to proceed.  Access recommendations: L radial, R radial PAD hx with strong pulses R and L   Rudean Haskell, MD Cardiologist [facility]  [address] [place], [place] [address] [phone]  8:10 AM

## 2022-03-31 NOTE — Progress Notes (Signed)
ANTICOAGULATION CONSULT NOTE   Pharmacy Consult for Heparin  Indication: chest pain/ACS  Allergies  Allergen Reactions   Empagliflozin Other (See Comments)    Abdominal pain/dizziness     Vital Signs: Temp: 97.5 F (36.4 C) (06/15 0750) Temp Source: Oral (06/15 0750) BP: 107/59 (06/15 1200) Pulse Rate: 59 (06/15 1200)  Labs: Recent Labs    03/30/22 2157 03/31/22 0018 03/31/22 0433 03/31/22 1223  HGB 12.1*  --  12.0*  --   HCT 37.0*  --  35.6*  --   PLT 207  --  186  --   HEPARINUNFRC  --   --   --  <0.10*  CREATININE 1.24  --  1.13  --   TROPONINIHS 24* 41*  --   --      Estimated Creatinine Clearance: 59.7 mL/min (by C-G formula based on SCr of 1.13 mg/dL).   Medical History: Past Medical History:  Diagnosis Date   Aortic stenosis    Arrhythmia    Premature beats   Coronary atherosclerosis of artery bypass graft    CABG 2009, LHC 07/2016 restenosis of SVG to OM treated with DES   Diabetes mellitus ([place])    Essential hypertension, benign    Fatty liver    History of kidney stones    PAD (peripheral artery disease) ([place])    Pure hypercholesterolemia      Assessment: 76 y/o M with cardiac history presents to the ED with chest pain. Very mild elevation in troponin. History of CABG and LHC. Starting heparin. Hgb 12. Renal function ok. PTA meds reviewed.   Heparin level undetectable 6/15 afternoon. No signs of bleeding or issues with infusion noted.   Goal of Therapy:  Heparin level 0.3-0.7 units/ml Monitor platelets by anticoagulation protocol: Yes   Plan:  Bolus heparin 2,500 units x1  Increase heparin drip to 1,300 units/hr LHC 6/15 @ 1500  Daily CBC/Heparin level Monitor for bleeding F/U post-cath   Adria Dill, PharmD PGY-1 Acute Care Resident  03/31/2022 1:21 PM

## 2022-03-31 NOTE — Progress Notes (Signed)
ANTICOAGULATION CONSULT NOTE   Pharmacy Consult for Heparin  Indication: chest pain/ACS  Allergies  Allergen Reactions   Empagliflozin Other (See Comments)    Abdominal pain/dizziness     Vital Signs: Temp: 97.7 F (36.5 C) (06/15 1920) Temp Source: Oral (06/15 1920) BP: 126/59 (06/15 1920) Pulse Rate: 71 (06/15 1920)  Labs: Recent Labs    03/30/22 2157 03/31/22 0018 03/31/22 0433 03/31/22 1223 03/31/22 2116  HGB 12.1*  --  12.0*  --   --   HCT 37.0*  --  35.6*  --   --   PLT 207  --  186  --   --   HEPARINUNFRC  --   --   --  <0.10* 0.38  CREATININE 1.24  --  1.13  --   --   TROPONINIHS 24* 41*  --   --   --      Estimated Creatinine Clearance: 59.2 mL/min (by C-G formula based on SCr of 1.13 mg/dL).   Medical History: Past Medical History:  Diagnosis Date   Aortic stenosis    Arrhythmia    Premature beats   Coronary atherosclerosis of artery bypass graft    CABG 2009, LHC 07/2016 restenosis of SVG to OM treated with DES   Diabetes mellitus ([place])    Essential hypertension, benign    Fatty liver    History of kidney stones    PAD (peripheral artery disease) ([place])    Pure hypercholesterolemia      Assessment: 76 y/o M with cardiac history presents to the ED with chest pain. Very mild elevation in troponin. History of CABG and LHC. Starting heparin. Hgb 12. Renal function ok. PTA meds reviewed.   Heparin level therapeutic (0.38) on infusion at 1300 units/hr. No bleeding noted. Plan for cath 6/16.  Goal of Therapy:  Heparin level 0.3-0.7 units/ml Monitor platelets by anticoagulation protocol: Yes   Plan:  Continue heparin at 1300 units/hr F/u daily heparin level to confirm therapeutic  Sherlon Handing, PharmD, BCPS Please see amion for complete clinical pharmacist phone list 03/31/2022 10:24 PM

## 2022-04-01 ENCOUNTER — Encounter (HOSPITAL_COMMUNITY)
Admission: EM | Disposition: A | Discharge status: Home / Self Care | Payer: Self-pay | Source: Home / Self Care | Attending: Cardiology

## 2022-04-01 ENCOUNTER — Encounter (HOSPITAL_COMMUNITY): Payer: Self-pay | Admitting: Interventional Cardiology

## 2022-04-01 DIAGNOSIS — I214 Non-ST elevation (NSTEMI) myocardial infarction: Secondary | ICD-10-CM

## 2022-04-01 DIAGNOSIS — I25710 Atherosclerosis of autologous vein coronary artery bypass graft(s) with unstable angina pectoris: Secondary | ICD-10-CM | POA: Diagnosis not present

## 2022-04-01 DIAGNOSIS — I25110 Atherosclerotic heart disease of native coronary artery with unstable angina pectoris: Secondary | ICD-10-CM

## 2022-04-01 HISTORY — PX: LEFT HEART CATH AND CORS/GRAFTS ANGIOGRAPHY: CATH118250

## 2022-04-01 HISTORY — PX: CORONARY BALLOON ANGIOPLASTY: CATH118233

## 2022-04-01 LAB — POCT ACTIVATED CLOTTING TIME
Activated Clotting Time: 353 seconds
Activated Clotting Time: 317 seconds

## 2022-04-01 LAB — GLUCOSE, CAPILLARY
Glucose-Capillary: 183 mg/dL — ABNORMAL HIGH (ref 70–99)
Glucose-Capillary: 167 mg/dL — ABNORMAL HIGH (ref 70–99)
Glucose-Capillary: 245 mg/dL — ABNORMAL HIGH (ref 70–99)

## 2022-04-01 SURGERY — LEFT HEART CATH AND CORS/GRAFTS ANGIOGRAPHY
Anesthesia: LOCAL

## 2022-04-01 MED ORDER — HEPARIN (PORCINE) IN NACL 1000-0.9 UT/500ML-% IV SOLN
INTRAVENOUS | Status: DC | PRN
  Administered 2022-04-01 (×2): 500 mL

## 2022-04-01 MED ORDER — HEPARIN SODIUM (PORCINE) 1000 UNIT/ML IJ SOLN
INTRAMUSCULAR | Status: DC | PRN
  Administered 2022-04-01: 4500 [IU] via INTRAVENOUS
  Administered 2022-04-01: 5000 [IU] via INTRAVENOUS

## 2022-04-01 MED ORDER — HYDRALAZINE HCL 20 MG/ML IJ SOLN: 10.0000 mg | INTRAMUSCULAR | Status: AC | PRN

## 2022-04-01 MED ORDER — FENTANYL CITRATE (PF) 100 MCG/2ML IJ SOLN
INTRAMUSCULAR | Status: AC
  Filled 2022-04-01: qty 2

## 2022-04-01 MED ORDER — ONDANSETRON HCL 4 MG/2ML IJ SOLN: 4.0000 mg | Freq: Four times a day (QID) | INTRAMUSCULAR | Status: DC | PRN

## 2022-04-01 MED ORDER — SODIUM CHLORIDE 0.9 % IV SOLN: INTRAVENOUS | Status: AC

## 2022-04-01 MED ORDER — MIDAZOLAM HCL 2 MG/2ML IJ SOLN
INTRAMUSCULAR | Status: DC | PRN
  Administered 2022-04-01: 1 mg via INTRAVENOUS
  Administered 2022-04-01: 2 mg via INTRAVENOUS

## 2022-04-01 MED ORDER — VERAPAMIL HCL 2.5 MG/ML IV SOLN
INTRAVENOUS | Status: DC | PRN
  Administered 2022-04-01: 10 mL via INTRA_ARTERIAL

## 2022-04-01 MED ORDER — FENTANYL CITRATE (PF) 100 MCG/2ML IJ SOLN
INTRAMUSCULAR | Status: DC | PRN
  Administered 2022-04-01 (×2): 25 ug via INTRAVENOUS

## 2022-04-01 MED ORDER — NITROGLYCERIN 1 MG/10 ML FOR IR/CATH LAB
INTRA_ARTERIAL | Status: AC
  Filled 2022-04-01: qty 10

## 2022-04-01 MED ORDER — HEPARIN (PORCINE) IN NACL 1000-0.9 UT/500ML-% IV SOLN
INTRAVENOUS | Status: AC
  Filled 2022-04-01: qty 500

## 2022-04-01 MED ORDER — SODIUM CHLORIDE 0.9% FLUSH
3.0000 mL | Freq: Two times a day (BID) | INTRAVENOUS | Status: DC
  Administered 2022-04-01: 3 mL via INTRAVENOUS

## 2022-04-01 MED ORDER — CLOPIDOGREL BISULFATE 75 MG PO TABS: 75.0000 mg | ORAL_TABLET | Freq: Every day | ORAL | Status: DC

## 2022-04-01 MED ORDER — HEPARIN SODIUM (PORCINE) 1000 UNIT/ML IJ SOLN
INTRAMUSCULAR | Status: AC
  Filled 2022-04-01: qty 10

## 2022-04-01 MED ORDER — MIDAZOLAM HCL 2 MG/2ML IJ SOLN
INTRAMUSCULAR | Status: AC
  Filled 2022-04-01: qty 2

## 2022-04-01 MED ORDER — LIDOCAINE HCL (PF) 1 % IJ SOLN
INTRAMUSCULAR | Status: AC
  Filled 2022-04-01: qty 30

## 2022-04-01 MED ORDER — SODIUM CHLORIDE 0.9 % IV SOLN: 250.0000 mL | INTRAVENOUS | Status: DC | PRN

## 2022-04-01 MED ORDER — ACETAMINOPHEN 325 MG PO TABS: 650.0000 mg | ORAL_TABLET | ORAL | Status: DC | PRN

## 2022-04-01 MED ORDER — LABETALOL HCL 5 MG/ML IV SOLN: 10.0000 mg | INTRAVENOUS | Status: AC | PRN

## 2022-04-01 MED ORDER — IOHEXOL 350 MG/ML SOLN
INTRAVENOUS | Status: DC | PRN
  Administered 2022-04-01: 120 mL

## 2022-04-01 MED ORDER — SODIUM CHLORIDE 0.9% FLUSH: 3.0000 mL | INTRAVENOUS | Status: DC | PRN

## 2022-04-01 MED ORDER — LIDOCAINE HCL (PF) 1 % IJ SOLN
INTRAMUSCULAR | Status: DC | PRN
  Administered 2022-04-01: 2 mL

## 2022-04-01 MED ORDER — NITROGLYCERIN 1 MG/10 ML FOR IR/CATH LAB
INTRA_ARTERIAL | Status: DC | PRN
  Administered 2022-04-01: 200 ug via INTRACORONARY
  Administered 2022-04-01: 500 ug via INTRA_ARTERIAL

## 2022-04-01 MED ORDER — VERAPAMIL HCL 2.5 MG/ML IV SOLN
INTRAVENOUS | Status: AC
  Filled 2022-04-01: qty 2

## 2022-04-01 SURGICAL SUPPLY — 25 items
BALL SAPPHIRE NC24 2.50X10 (BALLOONS) ×2
BALLN SAPPHIRE 2.0X10 (BALLOONS) ×2
BALLN SCOREFLEX 1.75X10 (BALLOONS) ×2
BALLN SCOREFLEX 2.0X10 (BALLOONS) ×4
BALLOON SAPPHIRE 2.0X10 (BALLOONS) IMPLANT
BALLOON SAPPHIRE NC24 2.50X10 (BALLOONS) IMPLANT
BALLOON SCOREFLEX 1.75X10 (BALLOONS) IMPLANT
BALLOON SCOREFLEX 2.0X10 (BALLOONS) IMPLANT
BAND ZEPHYR COMPRESS 30 LONG (HEMOSTASIS) ×1 IMPLANT
CATH INFINITI 5FR MULTPACK ANG (CATHETERS) ×1 IMPLANT
CATH LAUNCHER 6FR AL1 (CATHETERS) IMPLANT
CATHETER LAUNCHER 6FR AL1 (CATHETERS) ×2
ELECT DEFIB PAD ADLT CADENCE (PAD) ×1 IMPLANT
GLIDESHEATH SLEND SS 6F .021 (SHEATH) ×1 IMPLANT
GUIDEWIRE INQWIRE 1.5J.035X260 (WIRE) IMPLANT
INQWIRE 1.5J .035X260CM (WIRE) ×2
KIT ENCORE 26 ADVANTAGE (KITS) ×1 IMPLANT
KIT HEART LEFT (KITS) ×2 IMPLANT
KIT HEMO VALVE WATCHDOG (MISCELLANEOUS) ×1 IMPLANT
PACK CARDIAC CATHETERIZATION (CUSTOM PROCEDURE TRAY) ×2 IMPLANT
TRANSDUCER W/STOPCOCK (MISCELLANEOUS) ×2 IMPLANT
TUBING CIL FLEX 10 FLL-RA (TUBING) ×2 IMPLANT
WIRE ASAHI GRAND SLAM 180CM (WIRE) ×1 IMPLANT
WIRE ASAHI PROWATER 180CM (WIRE) ×1 IMPLANT
WIRE EMERALD ST .035X150CM (WIRE) ×1 IMPLANT

## 2022-04-01 NOTE — Discharge Summary (Addendum)
Discharge Summary    Patient ID: [name] [name] MRN: [id number]; DOB: 04/08/1946  Admit date: 03/30/2022 Discharge date: 04/01/2022  PCP:  [facility] [place] Providers Cardiologist:  Sherren Mocha, MD        Discharge Diagnoses    Principal Problem:   NSTEMI (non-ST elevated myocardial infarction) [facility]) Active Problems:   HLD (hyperlipidemia)   Essential hypertension   CAD, ARTERY BYPASS GRAFT   Type 2 diabetes mellitus with hyperglycemia ([place])   PAD (peripheral artery disease) ([place])   Chest pain    Diagnostic Studies/Procedures    Cath 04/01/2022   Ost LAD to Prox LAD lesion is 100% stenosed.  LIMA to LAD is patent.   Dist LAD lesion is 70% stenosed.  Diffuse disease noted in the native LAD.   Prox RCA to Mid RCA lesion is 100% stenosed.  SVG to PDA is patent.   Dist RCA lesion is 40% stenosed.   Ost RPDA to RPDA lesion is 40% stenosed.   Ost Cx to Prox Cx lesion is 99% stenosed.   SVG to OM is patent.  There is a Dist Graft to Insertion lesion is 80% stenosed.   Scoring balloon angioplasty was performed using a 1.75 mm score flex, followed by a BALL SAPPHIRE NC24 2.50X10.   Post intervention, there is a 10% residual stenosis.   There is moderate aortic valve stenosis.  Mean gradient 37 mmHg   Continue aggressive secondary prevention.  Dual antiplatelet therapy going forward.   Area of disease was distal in the graft and there is difficulty in delivering equipment as well.  I do not think an additional stent would be beneficial.  Could consider drug-eluting balloon if available in the event that he has further restenosis. _____________   History of Present Illness     [name] [name] is a 76 y.o. male with hx of coronary and peripheral arterial disease, aortic stenosis, prior CABG and PCI procedures post ABG, PCI  (2020- stent to right PDA beyond vein graft insertion) , PCI (SVG-OM), right femoral endarterectomy who is being seen  03/31/2022 for the evaluation of chest pain.  The patient just saw Dr [name] on 03/04/22 for exertional angina and was on good medical therapy (imdur and ranexa), however, his angina kept getting worse with exertion as well as was present intermittently at rest, associated with SOB and lightheadedness since yesterday.  He took multiple rounds of nitro and aspirin and presented to the ER.   While in the ED, serial troponin was 24 --> 41.    Hospital Course     Consultants: N/A   Patient was admitted to cardiology service and started on IV heparin.  The initial plan was for cardiac catheterization on 6/15, procedure was delayed until 6/16 due to urgent cases.  Cardiac catheterization performed on 04/01/2022 demonstrated 100% ostial to proximal LAD occlusion, patent LIMA to LAD, 70% distal LAD disease, 100% proximal to mid RCA occlusion with patent SVG to PDA, 40% distal RCA lesion, 40% ostial RPDA lesion, 99% ostial to proximal left circumflex lesion, patent SVG to OM with 80% distal graft disease treated with balloon angioplasty.  SVG to OM disease was reduced down to 10% after the procedure.  Postprocedure, patient did well without significant chest discomfort or worsening dyspnea.  He was seen by Dr. Gasper Sells and was felt to be stable for discharge from cardiac perspective.  Outpatient follow-up has been arranged for early July.   Did the  patient have an acute coronary syndrome (MI, NSTEMI, STEMI, etc) this admission?:  Graft        _____________  Discharge Vitals Blood pressure 134/76, pulse 65, temperature 97.6 F (36.4 C), temperature source Oral, resp. rate 16, height 5' 9"  (1.753 m), weight 88.8 kg, SpO2 94 %.  Filed Weights   03/31/22 1008 03/31/22 1712 04/01/22 0504  Weight: 89.8 kg 88.2 kg 88.8 kg    Labs & Radiologic Studies    CBC Recent Labs    03/30/22 2157 03/31/22 0433  WBC 7.0 5.7  NEUTROABS 3.8  --   HGB 12.1* 12.0*  HCT 37.0* 35.6*  MCV 90.2 88.6  PLT  207 409   Basic Metabolic Panel Recent Labs    03/30/22 2157 03/31/22 0433  NA 139 139  K 3.9 3.7  CL 108 106  CO2 20* 21*  GLUCOSE 171* 129*  BUN 19 15  CREATININE 1.24 1.13  CALCIUM 9.4 9.2   Liver Function Tests Recent Labs    03/30/22 2157  AST 23  ALT 24  ALKPHOS 47  BILITOT 0.6  PROT 6.4*  ALBUMIN 3.7   No results for input(s): "LIPASE", "AMYLASE" in the last 72 hours. High Sensitivity Troponin:   Recent Labs  Lab 03/30/22 2157 03/31/22 0018  TROPONINIHS 24* 41*    BNP Invalid input(s): "POCBNP" D-Dimer No results for input(s): "DDIMER" in the last 72 hours. Hemoglobin A1C No results for input(s): "HGBA1C" in the last 72 hours. Fasting Lipid Panel No results for input(s): "CHOL", "HDL", "LDLCALC", "TRIG", "CHOLHDL", "LDLDIRECT" in the last 72 hours. Thyroid Function Tests No results for input(s): "TSH", "T4TOTAL", "T3FREE", "THYROIDAB" in the last 72 hours.  Invalid input(s): "FREET3" _____________  CARDIAC CATHETERIZATION  Result Date: 04/01/2022   Ost LAD to Prox LAD lesion is 100% stenosed.  LIMA to LAD is patent.   Dist LAD lesion is 70% stenosed.  Diffuse disease noted in the native LAD.   Prox RCA to Mid RCA lesion is 100% stenosed.  SVG to PDA is patent.   Dist RCA lesion is 40% stenosed.   Ost RPDA to RPDA lesion is 40% stenosed.   Ost Cx to Prox Cx lesion is 99% stenosed.   SVG to OM is patent.  There is a Dist Graft to Insertion lesion is 80% stenosed.   Scoring balloon angioplasty was performed using a 1.75 mm score flex, followed by a BALL SAPPHIRE NC24 2.50X10.   Post intervention, there is a 10% residual stenosis.   There is moderate aortic valve stenosis.  Mean gradient 37 mmHg Continue aggressive secondary prevention.  Dual antiplatelet therapy going forward. Area of disease was distal in the graft and there is difficulty in delivering equipment as well.  I do not think an additional stent would be beneficial.  Could consider drug-eluting  balloon if available in the event that he has further restenosis.   DG Chest Portable 1 View  Result Date: 03/30/2022 CLINICAL DATA:  Chest pain EXAM: PORTABLE CHEST 1 VIEW COMPARISON:  None Available. FINDINGS: Lungs are well expanded, symmetric, and clear. No pneumothorax or pleural effusion. Cardiac size within normal limits. Coronary artery bypass grafting has been performed. Pulmonary vascularity is normal. Osseous structures are age-appropriate. No acute bone abnormality. IMPRESSION: No active disease. Electronically Signed   By: Fidela Salisbury M.D.   On: 03/30/2022 22:41    Disposition   Pt is being discharged home today in good condition.  Follow-up Plans & Appointments     Follow-up  Information     [facility], [place], [place] Follow up on 04/22/2022.   Specialty: Cardiology Why: @3 :35pm for hospital follow up Contact information: [place] [address] [phone]                Discharge Instructions     AMB Referral to Cardiac Rehabilitation - Phase II   Complete by: As directed    Diagnosis: PTCA   After initial evaluation and assessments completed: Virtual Based Care may be provided alone or in conjunction with Phase 2 Cardiac Rehab based on patient barriers.: Yes       Discharge Medications   Allergies as of 04/01/2022       Reactions   Empagliflozin Other (See Comments)   Abdominal pain/dizziness        Medication List     STOP taking these medications    ezetimibe 10 MG tablet Commonly known as: ZETIA       TAKE these medications    amLODipine 10 MG tablet Commonly known as: NORVASC TAKE 1 TABLET BY MOUTH EVERY DAY   aspirin EC 81 MG tablet TAKE 1 TABLET BY MOUTH EVERY DAY   B-D UF III MINI PEN NEEDLES 31G X 5 MM Misc Generic drug: Insulin Pen Needle Use to inject Ozempic once a week   benazepril 40 MG tablet Commonly known as: LOTENSIN Take 40 mg by mouth daily.   cholecalciferol 25 MCG (1000 UNIT)  tablet Commonly known as: VITAMIN D3 Take 1,000 Units by mouth daily.   clopidogrel 75 MG tablet Commonly known as: PLAVIX TAKE 1 TABLET BY MOUTH EVERY DAY   ELDERBERRY PO Take 1 tablet by mouth daily.   finasteride 5 MG tablet Commonly known as: PROSCAR Take 5 mg by mouth daily.   hydrochlorothiazide 25 MG tablet Commonly known as: HYDRODIURIL TAKE 1 TABLET BY MOUTH EVERY DAY   insulin NPH Human 100 UNIT/ML injection Commonly known as: NOVOLIN N Inject 15 Units into the skin 2 (two) times daily before a meal.   isosorbide mononitrate 120 MG 24 hr tablet Commonly known as: IMDUR Take 1 tablet (120 mg total) by mouth daily.   metFORMIN 1000 MG tablet Commonly known as: GLUCOPHAGE Take 1,000 mg by mouth 2 (two) times daily with a meal.   metoprolol tartrate 50 MG tablet Commonly known as: LOPRESSOR Take 1 tablet (50 mg total) by mouth 2 (two) times daily.   multivitamin with minerals Tabs tablet Take 1 tablet by mouth daily.   nitroGLYCERIN 0.4 MG SL tablet Commonly known as: NITROSTAT PLACE 1 TABLET UNDER THE TONGUE EVERY 5 MINUTES AS NEEDED FOR CHEST PAIN. What changed: See the new instructions.   Precision QID Test test strip Generic drug: glucose blood 1 each by Other route as directed.   ranolazine 500 MG 12 hr tablet Commonly known as: Ranexa Take 1 tablet (500 mg total) by mouth 2 (two) times daily.   rosuvastatin 20 MG tablet Commonly known as: CRESTOR TAKE 1 TABLET BY MOUTH EVERY DAY   Zinc Gluconate 100 MG Tabs Take 1 tablet by mouth daily.           Outstanding Labs/Studies   N/A  Duration of Discharge Encounter   Greater than 30 minutes including physician time.  Hilbert Corrigan, PA 04/01/2022, 4:29 PM  Personally seen and examined. Agree with APP above with the following comments: SVG-OM disease with unstable angina. Optimized medical therapy, s/p POBA. Restriction on L radial post cath.  Scheduled f/u. See 04/01/22 note for  more details.  Rudean Haskell, MD Cardiologist [facility], [address] [place],  [address] [phone]  4:43 PM

## 2022-04-01 NOTE — Interval H&P Note (Signed)
Cath Lab Visit (complete for each Cath Lab visit)  Clinical Evaluation Leading to the Procedure:   ACS: Yes.    Non-ACS:    Anginal Classification: CCS IV  Anti-ischemic medical therapy: Minimal Therapy (1 class of medications)  Non-Invasive Test Results: No non-invasive testing performed  Prior CABG: prior CABG       History and Physical Interval Note:  04/01/2022 7:39 AM  [name] [name]  has presented today for surgery, with the diagnosis of unstable angina.  The various methods of treatment have been discussed with the patient and family. After consideration of risks, benefits and other options for treatment, the patient has consented to  Procedure(s): LEFT HEART CATH AND CORS/GRAFTS ANGIOGRAPHY (N/A) as a surgical intervention.  The patient's history has been reviewed, patient examined, no change in status, stable for surgery.  I have reviewed the patient's chart and labs.  Questions were answered to the patient's satisfaction.     [name] [name]

## 2022-04-01 NOTE — Progress Notes (Signed)
[facility] progress note  Progress Note  Patient Name: [name] [name] Date of Encounter: 04/01/2022  Primary Cardiologist: Sherren Mocha, MD   Subjective   Overnight cath was delayed until 6/16 because of emergency cases. Had ISR and POBA Patient notes that he feels much better No CP, SOB, Palpitations.   Left hand with good cap refill.  Inpatient Medications    Scheduled Meds:  [MAR Hold] amLODipine  10 mg Oral Daily   [MAR Hold] aspirin EC  81 mg Oral Daily   [MAR Hold] benazepril  40 mg Oral Daily   [MAR Hold] clopidogrel  75 mg Oral Daily   [MAR Hold] insulin aspart  0-15 Units Subcutaneous TID WC   [MAR Hold] insulin aspart  0-5 Units Subcutaneous QHS   [MAR Hold] isosorbide mononitrate  120 mg Oral Daily   [MAR Hold] metoprolol tartrate  50 mg Oral BID   [MAR Hold] multivitamin with minerals  1 tablet Oral Daily   [MAR Hold] ranolazine  1,000 mg Oral BID   [MAR Hold] rosuvastatin  20 mg Oral Daily   [MAR Hold] sodium chloride flush  3 mL Intravenous Q12H   [MAR Hold] sodium chloride flush  3 mL Intravenous Q12H   [MAR Hold] cyanocobalamin  1,000 mcg Oral Daily   Continuous Infusions:  sodium chloride     sodium chloride 89.8 mL/hr at 04/01/22 0544   sodium chloride     sodium chloride 100 mL/hr at 04/01/22 1001   sodium chloride 1 mL/kg/hr (04/01/22 0544)   heparin 1,300 Units/hr (04/01/22 0544)   PRN Meds: sodium chloride, sodium chloride, [MAR Hold] acetaminophen, acetaminophen, fentaNYL, Heparin (Porcine) in NaCl, heparin sodium (porcine), hydrALAZINE, iohexol, labetalol, lidocaine (PF), midazolam, [MAR Hold] nitroGLYCERIN, nitroGLYCERIN, [MAR Hold] ondansetron (ZOFRAN) IV, ondansetron (ZOFRAN) IV, [MAR Hold] phenazopyridine, Radial Cocktail/Verapamil only, sodium chloride flush, sodium chloride flush   Vital Signs    Vitals:   04/01/22 0956 04/01/22 1000 04/01/22 1015 04/01/22 1030  BP:  (!) 165/62 (!) 143/69 (!) 146/57  Pulse: 70 61 (!) 57 (!) 55  Resp: 17 17  13 13   Temp:      TempSrc:      SpO2: 96% 95% 94% 96%  Weight:      Height:        Intake/Output Summary (Last 24 hours) at 04/01/2022 1109 Last data filed at 04/01/2022 0544 Gross per 24 hour  Intake 1642.83 ml  Output 1350 ml  Net 292.83 ml   Filed Weights   03/31/22 1008 03/31/22 1712 04/01/22 0504  Weight: 89.8 kg 88.2 kg 88.8 kg    Telemetry    NA - Personally Reviewed  Physical Exam   Gen: no distress   Neck: No JVD  Ears: Bilateral Pilar Plate Sign Cardiac: No Rubs or Gallops, systolic heart murmur, RRR +2 radial pulses Respiratory: Clear to auscultation bilaterally, normal effort, normal  respiratory rate GI: Soft, nontender, non-distended  MS: No edema;  moves all extremities Integument: Skin feels warm no L radial hematoma Neuro:  At time of evaluation, alert and oriented to person/place/time/situation  Psych: Normal affect, patient feels better   Labs    Chemistry Recent Labs  Lab 03/30/22 2157 03/31/22 0433  NA 139 139  K 3.9 3.7  CL 108 106  CO2 20* 21*  GLUCOSE 171* 129*  BUN 19 15  CREATININE 1.24 1.13  CALCIUM 9.4 9.2  PROT 6.4*  --   ALBUMIN 3.7  --   AST 23  --   ALT 24  --  ALKPHOS 47  --   BILITOT 0.6  --   GFRNONAA 60* >60  ANIONGAP 11 12     Hematology Recent Labs  Lab 03/30/22 2157 03/31/22 0433  WBC 7.0 5.7  RBC 4.10* 4.02*  HGB 12.1* 12.0*  HCT 37.0* 35.6*  MCV 90.2 88.6  MCH 29.5 29.9  MCHC 32.7 33.7  RDW 13.5 13.5  PLT 207 186    Cardiac EnzymesNo results for input(s): "TROPONINI" in the last 168 hours. No results for input(s): "TROPIPOC" in the last 168 hours.   BNPNo results for input(s): "BNP", "PROBNP" in the last 168 hours.   DDimer No results for input(s): "DDIMER" in the last 168 hours.   Radiology    CARDIAC CATHETERIZATION  Result Date: 04/01/2022   Ost LAD to Prox LAD lesion is 100% stenosed.  LIMA to LAD is patent.   Dist LAD lesion is 70% stenosed.  Diffuse disease noted in the native LAD.   Prox  RCA to Mid RCA lesion is 100% stenosed.  SVG to PDA is patent.   Dist RCA lesion is 40% stenosed.   Ost RPDA to RPDA lesion is 40% stenosed.   Ost Cx to Prox Cx lesion is 99% stenosed.   SVG to OM is patent.  There is a Dist Graft to Insertion lesion is 80% stenosed.   Scoring balloon angioplasty was performed using a 1.75 mm score flex, followed by a BALL SAPPHIRE NC24 2.50X10.   Post intervention, there is a 10% residual stenosis.   There is moderate aortic valve stenosis.  Mean gradient 37 mmHg Continue aggressive secondary prevention.  Dual antiplatelet therapy going forward. Area of disease was distal in the graft and there is difficulty in delivering equipment as well.  I do not think an additional stent would be beneficial.  Could consider drug-eluting balloon if available in the event that he has further restenosis.   DG Chest Portable 1 View  Result Date: 03/30/2022 CLINICAL DATA:  Chest pain EXAM: PORTABLE CHEST 1 VIEW COMPARISON:  None Available. FINDINGS: Lungs are well expanded, symmetric, and clear. No pneumothorax or pleural effusion. Cardiac size within normal limits. Coronary artery bypass grafting has been performed. Pulmonary vascularity is normal. Osseous structures are age-appropriate. No acute bone abnormality. IMPRESSION: No active disease. Electronically Signed   By: Fidela Salisbury M.D.   On: 03/30/2022 22:41     Patient Profile     76 y.o. male  with CAD here for unstable angina  Assessment & Plan    CAD  (LIMA to LAD, SVG-dRCA, SVG- Diag, SVG-OM) s/p PCI SVG-OM, and now with POBA and residual distal disease PAD s/p right femoral endarterectomy HLD with DM Moderate AS HTN with DM - Continue imdur 120 mg PO daily - Continue ASA and plavix likely for life Continue benazepril 40 mg - continue norvasc 10 - plan for discharge on ranexa 1000 mg BID - conitnue zetia and statin - we had discussed limitations to his options going forward If continues to do well we will plan  for DC this PM (will check in with Dr. Irish Lack to make sure there are no additional concerns)   For questions or updates, please contact [facility] Please consult [email or web address] for contact info under Cardiology/STEMI.      Rudean Haskell, MD Cardiologist [facility], [address] [place], [place] [address] [phone]  11:09 AM

## 2022-04-01 NOTE — Progress Notes (Signed)
CARDIAC REHAB PHASE I   PTCA education completed with pt. Pt educated on importance of ASA and Plavix. Pt given heart healthy and diabetic diets. Reviewed site care, restrictions, and exercise guidelines. Will refer to CRP II [place] to meet the requirements, pt not interested in attending.  [phone] Rufina Falco, RN BSN 04/01/2022 3:09 PM

## 2022-04-01 NOTE — Discharge Instructions (Signed)
No driving for 48 hours. No lifting over 5 lbs for 1 week. No sexual activity for 1 week.  Keep procedure site clean & dry. If you notice increased pain, swelling, bleeding or pus, call/return!  You may shower, but no soaking baths/hot tubs/pools for 1 week.   Please hold metformin for 48 hours post cath due to interaction with contrast dye.  You can resume metformin on 04/04/2022.

## 2022-04-01 NOTE — Plan of Care (Signed)
  Problem: Education: Goal: Understanding of cardiac disease, CV risk reduction, and recovery process will improve Outcome: Progressing Goal: Individualized Educational Video(s) Outcome: Progressing   Problem: Activity: Goal: Ability to tolerate increased activity will improve Outcome: Progressing   Problem: Cardiac: Goal: Ability to achieve and maintain adequate cardiovascular perfusion will improve Outcome: Progressing   Problem: Health Behavior/Discharge Planning: Goal: Ability to safely manage health-related needs after discharge will improve Outcome: Progressing   Problem: Education: Goal: Ability to describe self-care measures that may prevent or decrease complications (Diabetes Survival Skills Education) will improve Outcome: Progressing Goal: Individualized Educational Video(s) Outcome: Progressing   Problem: Coping: Goal: Ability to adjust to condition or change in health will improve Outcome: Progressing   Problem: Fluid Volume: Goal: Ability to maintain a balanced intake and output will improve Outcome: Progressing   Problem: Health Behavior/Discharge Planning: Goal: Ability to identify and utilize available resources and services will improve Outcome: Progressing Goal: Ability to manage health-related needs will improve Outcome: Progressing   Problem: Metabolic: Goal: Ability to maintain appropriate glucose levels will improve Outcome: Progressing   Problem: Nutritional: Goal: Maintenance of adequate nutrition will improve Outcome: Progressing Goal: Progress toward achieving an optimal weight will improve Outcome: Progressing   Problem: Skin Integrity: Goal: Risk for impaired skin integrity will decrease Outcome: Progressing   Problem: Tissue Perfusion: Goal: Adequacy of tissue perfusion will improve Outcome: Progressing   Problem: Education: Goal: Understanding of CV disease, CV risk reduction, and recovery process will improve Outcome:  Progressing Goal: Individualized Educational Video(s) Outcome: Progressing   Problem: Activity: Goal: Ability to return to baseline activity level will improve Outcome: Progressing   Problem: Cardiovascular: Goal: Ability to achieve and maintain adequate cardiovascular perfusion will improve Outcome: Progressing Goal: Vascular access site(s) Level 0-1 will be maintained Outcome: Progressing   Problem: Health Behavior/Discharge Planning: Goal: Ability to safely manage health-related needs after discharge will improve Outcome: Progressing   Problem: Education: Goal: Knowledge of General Education information will improve Description: Including pain rating scale, medication(s)/side effects and non-pharmacologic comfort measures Outcome: Progressing   Problem: Health Behavior/Discharge Planning: Goal: Ability to manage health-related needs will improve Outcome: Progressing   Problem: Clinical Measurements: Goal: Ability to maintain clinical measurements within normal limits will improve Outcome: Progressing Goal: Will remain free from infection Outcome: Progressing Goal: Diagnostic test results will improve Outcome: Progressing Goal: Respiratory complications will improve Outcome: Progressing Goal: Cardiovascular complication will be avoided Outcome: Progressing   Problem: Activity: Goal: Risk for activity intolerance will decrease Outcome: Progressing   Problem: Nutrition: Goal: Adequate nutrition will be maintained Outcome: Progressing   Problem: Coping: Goal: Level of anxiety will decrease Outcome: Progressing   Problem: Elimination: Goal: Will not experience complications related to bowel motility Outcome: Progressing Goal: Will not experience complications related to urinary retention Outcome: Progressing   Problem: Pain Managment: Goal: General experience of comfort will improve Outcome: Progressing   Problem: Safety: Goal: Ability to remain free from  injury will improve Outcome: Progressing   Problem: Skin Integrity: Goal: Risk for impaired skin integrity will decrease Outcome: Progressing

## 2022-04-07 ENCOUNTER — Other Ambulatory Visit: Payer: Self-pay | Admitting: General Practice

## 2022-04-22 ENCOUNTER — Ambulatory Visit: Payer: Medicare Other | Admitting: Physician Assistant

## 2022-04-25 ENCOUNTER — Ambulatory Visit (INDEPENDENT_AMBULATORY_CARE_PROVIDER_SITE_OTHER): Payer: Medicare Other | Admitting: Cardiology

## 2022-04-25 ENCOUNTER — Encounter: Payer: Self-pay | Admitting: Cardiology

## 2022-04-25 VITALS — BP 130/80 | HR 56 | Ht 69.0 in | Wt 202.6 lb

## 2022-04-25 DIAGNOSIS — I10 Essential (primary) hypertension: Secondary | ICD-10-CM

## 2022-04-25 DIAGNOSIS — I25119 Atherosclerotic heart disease of native coronary artery with unspecified angina pectoris: Secondary | ICD-10-CM | POA: Diagnosis not present

## 2022-04-25 DIAGNOSIS — I350 Nonrheumatic aortic (valve) stenosis: Secondary | ICD-10-CM

## 2022-04-25 DIAGNOSIS — I25118 Atherosclerotic heart disease of native coronary artery with other forms of angina pectoris: Secondary | ICD-10-CM

## 2022-04-25 DIAGNOSIS — E782 Mixed hyperlipidemia: Secondary | ICD-10-CM

## 2022-04-25 MED ORDER — RANOLAZINE ER 1000 MG PO TB12: 1000.0000 mg | ORAL_TABLET | Freq: Two times a day (BID) | ORAL | 11 refills | Status: DC

## 2022-04-25 MED ORDER — ISOSORBIDE MONONITRATE ER 60 MG PO TB24: 180.0000 mg | ORAL_TABLET | Freq: Every day | ORAL | 11 refills | Status: DC

## 2022-04-25 NOTE — Patient Instructions (Signed)
Medication Instructions:   INCREASE Ranexa one (1) tablet by mouth (1000 mg ) twice daily.   INCREASE Imdur three (3) tablet by mouth ( 180 mg ) daily.   *If you need a refill on your cardiac medications before your next appointment, please call your pharmacy*   Lab Work:  TODAY!!! BMET  If you have labs (blood work) drawn today and your tests are completely normal, you will receive your results only by: [place] (if you have MyChart) OR A paper copy in the mail If you have any lab test that is abnormal or we need to change your treatment, we will call you to review the results.   Testing/Procedures:  None ordered.   Follow-Up: At [facility], you and your health needs are our priority.  As part of our continuing mission to provide you with exceptional heart care, we have created designated Provider Care Teams.  These Care Teams include your primary Cardiologist (physician) and Advanced Practice Providers (APPs -  Physician Assistants and Nurse Practitioners) who all work together to provide you with the care you need, when you need it.  We recommend signing up for the patient portal called "MyChart".  Sign up information is provided on this After Visit Summary.  MyChart is used to connect with patients for Virtual Visits (Telemedicine).  Patients are able to view lab/test results, encounter notes, upcoming appointments, etc.  Non-urgent messages can be sent to your provider as well.   To learn more about what you can do with MyChart, go to [email or web address].    Your next appointment:   1 month(s)  The format for your next appointment:   In Person  Provider:   Sherren Mocha, MD     Important Information About Sugar

## 2022-04-25 NOTE — Progress Notes (Signed)
Cardiology Office Note   Date:  04/25/2022   ID:  Carlus, Stay Feb 01, 1946, MRN [id number]  PCP:  Marda Stalker, PA-C  Cardiologist:  Dr. [name]    Chief Complaint  Patient presents with   post hospital    NSTEMI      History of Present Illness: [name] [name] is a 76 y.o. male who presents for post hospital for NSTEMI, cath and  cutting balloon angioplasty to VG to OM-it was not felt another stent would be beneficial.   Could consider drug-eluting balloon if available in the event that he has further restenosis.  He has a hx of coronary and peripheral arterial disease, aortic stenosis, prior CABG and PCI procedures post ABG, PCI  (2020- stent to right PDA beyond vein graft insertion) , PCI (SVG-OM), right femoral endarterectomy.  Pt presented to ER 03/31/22 after angina at rest SOB and lightheadedness and multiple rounds of nitro and ASA EKG: ST depression in inferior and lateral leads (slightly more pronounced than before).  He was admitted and underwent cardiac cath as above and report  below.  Troponin pk was 41. Discharged with ASA and plavix along with imdur 120, BB ranexa 500 BID and crestor 20. Zetia was stopped.   Needs lipids in 4 weeks.    Today he is stable, he admits to continued stable angina if he walks into to [facility] and to microbiology lab he has to stop and rest due to chest pain.  Goes away quickly at rest.   No N,V or diaphoresis.  No pain that awakens in the night.    Past Medical History:  Diagnosis Date   Aortic stenosis    Arrhythmia    Premature beats   Coronary atherosclerosis of artery bypass graft    CABG 2009, LHC 07/2016 restenosis of SVG to OM treated with DES   Diabetes mellitus ([place])    Essential hypertension, benign    Fatty liver    History of kidney stones    PAD (peripheral artery disease) ([place])    Pure hypercholesterolemia     Past Surgical History:  Procedure Laterality Date   ABDOMINAL AORTOGRAM W/LOWER EXTREMITY N/A 12/30/2020    Procedure: ABDOMINAL AORTOGRAM W/LOWER EXTREMITY;  Surgeon: Wellington Hampshire, MD;  Location: [facility];  Service: Cardiovascular;  Laterality: N/A;   ADENOIDECTOMY     CARDIAC CATHETERIZATION N/A 07/18/2016   Procedure: Left Heart Cath and Cors/Grafts Angiography;  Surgeon: Burnell Blanks, MD;  Location: [facility];  Service: Cardiovascular;  Laterality: N/A;   CARDIAC CATHETERIZATION N/A 07/18/2016   Procedure: Coronary Stent Intervention;  Surgeon: Burnell Blanks, MD;  Location: [facility];  Service: Cardiovascular;  Laterality: N/A;   CHOLECYSTECTOMY     CORONARY ANGIOPLASTY WITH STENT PLACEMENT     CORONARY ARTERY BYPASS GRAFT     CORONARY BALLOON ANGIOPLASTY N/A 09/10/2021   Procedure: CORONARY BALLOON ANGIOPLASTY;  Surgeon: Sherren Mocha, MD;  Location: [facility];  Service: Cardiovascular;  Laterality: N/A;   CORONARY BALLOON ANGIOPLASTY N/A 04/01/2022   Procedure: CORONARY BALLOON ANGIOPLASTY;  Surgeon: Jettie Booze, MD;  Location: [facility];  Service: Cardiovascular;  Laterality: N/A;  SVG-OM1   CORONARY STENT INTERVENTION N/A 01/11/2019   Procedure: CORONARY STENT INTERVENTION;  Surgeon: Sherren Mocha, MD;  Location: [facility];  Service: Cardiovascular;  Laterality: N/A;   ENDARTERECTOMY FEMORAL Right 01/13/2021   Procedure: RIGHT FEMORAL ENDARTERECTOMY  WITH PATCH ANGIOPLASTY;  Surgeon: Rosetta Posner, MD;  Location: [facility];  Service: Vascular;  Laterality: Right;   INGUINAL HERNIA REPAIR Right    LEFT HEART CATH AND CORS/GRAFTS ANGIOGRAPHY N/A 01/11/2019   Procedure: LEFT HEART CATH AND CORS/GRAFTS ANGIOGRAPHY;  Surgeon: Sherren Mocha, MD;  Location: [facility];  Service: Cardiovascular;  Laterality: N/A;   LEFT HEART CATH AND CORS/GRAFTS ANGIOGRAPHY N/A 09/10/2021   Procedure: LEFT HEART CATH AND CORS/GRAFTS ANGIOGRAPHY;  Surgeon: Sherren Mocha, MD;  Location: [facility];  Service:  Cardiovascular;  Laterality: N/A;   LEFT HEART CATH AND CORS/GRAFTS ANGIOGRAPHY N/A 04/01/2022   Procedure: LEFT HEART CATH AND CORS/GRAFTS ANGIOGRAPHY;  Surgeon: Jettie Booze, MD;  Location: [facility];  Service: Cardiovascular;  Laterality: N/A;   Right femoral endarterectomy and Dacron patch angioplasty  01/13/3021   TONSILLECTOMY       Current Outpatient Medications  Medication Sig Dispense Refill   amLODipine (NORVASC) 10 MG tablet TAKE 1 TABLET BY MOUTH EVERY DAY (Patient taking differently: Take 10 mg by mouth daily.) 90 tablet 3   aspirin 81 MG EC tablet TAKE 1 TABLET BY MOUTH EVERY DAY (Patient taking differently: Take 81 mg by mouth daily.) 90 tablet 3   benazepril (LOTENSIN) 40 MG tablet Take 40 mg by mouth daily.      cholecalciferol (VITAMIN D3) 25 MCG (1000 UNIT) tablet Take 1,000 Units by mouth daily.     clopidogrel (PLAVIX) 75 MG tablet TAKE 1 TABLET BY MOUTH EVERY DAY (Patient taking differently: Take 75 mg by mouth daily.) 90 tablet 3   ELDERBERRY PO Take 1 tablet by mouth daily.     finasteride (PROSCAR) 5 MG tablet Take 5 mg by mouth daily.     glucose blood (PRECISION QID TEST) test strip 1 each by Other route as directed.     hydrochlorothiazide (HYDRODIURIL) 25 MG tablet TAKE 1 TABLET BY MOUTH EVERY DAY (Patient taking differently: Take 25 mg by mouth daily.) 90 tablet 3   insulin NPH Human (NOVOLIN N) 100 UNIT/ML injection Inject 15 Units into the skin 2 (two) times daily before a meal.     Insulin Pen Needle (B-D UF III MINI PEN NEEDLES) 31G X 5 MM MISC Use to inject Ozempic once a week     isosorbide mononitrate (IMDUR) 120 MG 24 hr tablet TAKE 1 TABLET BY MOUTH EVERY DAY 90 tablet 3   metFORMIN (GLUCOPHAGE) 1000 MG tablet Take 1,000 mg by mouth 2 (two) times daily with a meal.     metoprolol tartrate (LOPRESSOR) 50 MG tablet Take 1 tablet (50 mg total) by mouth 2 (two) times daily. 180 tablet 3   Multiple Vitamin (MULTIVITAMIN WITH MINERALS) TABS  tablet Take 1 tablet by mouth daily.     nitroGLYCERIN (NITROSTAT) 0.4 MG SL tablet PLACE 1 TABLET UNDER THE TONGUE EVERY 5 MINUTES AS NEEDED FOR CHEST PAIN. (Patient taking differently: Place 0.4 mg under the tongue every 5 (five) minutes as needed for chest pain.) 25 tablet 11   ranolazine (RANEXA) 500 MG 12 hr tablet Take 1 tablet (500 mg total) by mouth 2 (two) times daily. 180 tablet 3   rosuvastatin (CRESTOR) 20 MG tablet TAKE 1 TABLET BY MOUTH EVERY DAY (Patient taking differently: Take 20 mg by mouth daily.) 90 tablet 3   Zinc Gluconate 100 MG TABS Take 1 tablet by mouth daily.     No current facility-administered medications for this visit.    Allergies:   Empagliflozin  Social History:  The patient  reports that he has never smoked. He has never used smokeless tobacco. He reports that he does not drink alcohol and does not use drugs.   Family History:  The patient's family history includes Heart disease in his mother.    ROS:  General:no colds or fevers, no weight changes Skin:no rashes or ulcers HEENT:no blurred vision, no congestion CV:see HPI PUL:see HPI GI:no diarrhea constipation or melena, no indigestion GU:no hematuria, no dysuria MS:no joint pain, no claudication Neuro:no syncope, no lightheadedness Endo:+ diabetes- stable glucose, no thyroid disease  Wt Readings from Last 3 Encounters:  04/25/22 202 lb 9.6 oz (91.9 kg)  04/01/22 195 lb 12.8 oz (88.8 kg)  03/04/22 198 lb (89.8 kg)     PHYSICAL EXAM: VS:  BP 130/80   Pulse (!) 56   Ht 5' 9"  (1.753 m)   Wt 202 lb 9.6 oz (91.9 kg)   SpO2 98%   BMI 29.92 kg/m  , BMI Body mass index is 29.92 kg/m. General:Pleasant affect, NAD Skin:Warm and dry, brisk capillary refill HEENT:normocephalic, sclera clear, mucus membranes moist Neck:supple, no JVD, no bruits  Heart:S1S2 RRR with 3/6 systolic murmur, gallup, rub or click Lungs:clear without rales, rhonchi, or wheezes WCH:ENID, non tender, + BS, do not palpate  liver spleen or masses Ext:no lower ext edema, 2+ pedal pulses, 2+ radial pulses Neuro:alert and oriented, MAE, follows commands, + facial symmetry    EKG:  EKG is ordered today.  The ekg ordered today demonstrates SB at 65, non specific ST and T wave abnormality, no acute process. Personally reviewed by myself.   Recent Labs: 03/30/2022: ALT 24 03/31/2022: BUN 15; Creatinine, Ser 1.13; Hemoglobin 12.0; Platelets 186; Potassium 3.7; Sodium 139    Lipid Panel    Component Value Date/Time   CHOL 109 01/14/2021 0105   CHOL 129 04/27/2020 0824   TRIG 131 01/14/2021 0105   HDL 44 01/14/2021 0105   HDL 39 (L) 04/27/2020 0824   CHOLHDL 2.5 01/14/2021 0105   VLDL 26 01/14/2021 0105   LDLCALC 39 01/14/2021 0105   LDLCALC 63 04/27/2020 0824       Other studies Reviewed: Additional studies/ records that were reviewed today include: .   Cath 04/01/2022   Ost LAD to Prox LAD lesion is 100% stenosed.  LIMA to LAD is patent.   Dist LAD lesion is 70% stenosed.  Diffuse disease noted in the native LAD.   Prox RCA to Mid RCA lesion is 100% stenosed.  SVG to PDA is patent.   Dist RCA lesion is 40% stenosed.   Ost RPDA to RPDA lesion is 40% stenosed.   Ost Cx to Prox Cx lesion is 99% stenosed.   SVG to OM is patent.  There is a Dist Graft to Insertion lesion is 80% stenosed.   Scoring balloon angioplasty was performed using a 1.75 mm score flex, followed by a BALL SAPPHIRE NC24 2.50X10.   Post intervention, there is a 10% residual stenosis.   There is moderate aortic valve stenosis.  Mean gradient 37 mmHg   Continue aggressive secondary prevention.  Dual antiplatelet therapy going forward.   Area of disease was distal in the graft and there is difficulty in delivering equipment as well.  I do not think an additional stent would be beneficial.  Could consider drug-eluting balloon if available in the event that he has further restenosis. Diagnostic Dominance:  Right  Intervention       Echo 02/17/22 FINDINGS  Left Ventricle: Left ventricular ejection fraction, by estimation, is 65  to 70%. Left ventricular ejection fraction by 3D volume is 68 %. The left  ventricle has normal function. The left ventricle has no regional wall  motion abnormalities. The left  ventricular internal cavity size was normal in size. There is no left  ventricular hypertrophy. Left ventricular diastolic parameters are  consistent with Grade I diastolic dysfunction (impaired relaxation).  Normal left ventricular filling pressure.   Right Ventricle: The right ventricular size is normal. No increase in  right ventricular wall thickness. Right ventricular systolic function is  normal. Tricuspid regurgitation signal is inadequate for assessing PA  pressure.   Left Atrium: Left atrial size was normal in size.   Right Atrium: Right atrial size was normal in size.   Pericardium: There is no evidence of pericardial effusion.   Mitral Valve: The mitral valve is degenerative in appearance. There is  mild calcification of the anterior mitral valve leaflet(s). Moderate  mitral annular calcification. No evidence of mitral valve regurgitation.  No evidence of mitral valve stenosis.   Tricuspid Valve: The tricuspid valve is normal in structure. Tricuspid  valve regurgitation is not demonstrated. No evidence of tricuspid  stenosis.   Aortic Valve: The aortic valve is calcified. There is severe calcifcation  of the aortic valve. There is severe thickening of the aortic valve.  Aortic valve regurgitation is trivial. Moderate aortic stenosis is  present. Aortic valve mean gradient measures   24.2 mmHg. Aortic valve peak gradient measures 43.2 mmHg. Aortic valve  area, by VTI measures 1.56 cm.   Pulmonic Valve: The pulmonic valve was normal in structure. Pulmonic valve  regurgitation is trivial. No evidence of pulmonic stenosis.   Aorta: Aortic dilatation noted. There  is borderline dilatation of the  ascending aorta, measuring 37 mm.   Venous: The inferior vena cava is normal in size with greater than 50%  respiratory variability, suggesting right atrial pressure of 3 mmHg.   IAS/Shunts: The interatrial septum appears to be lipomatous. No atrial  level shunt detected by color flow Doppler     ASSESSMENT AND PLAN:  1.  Ongoing stable angina.  He is interested in increasing Imdur to 180 my,  will need to order 60 mg 3 daily.at same time.  Will increase Ranexa to 1000 mg BID as well. Cr is stable, but will check BMP post cath as well.    2. CAD with hx of CABG 02/2008 with LIMA to LAD, VG to PDA, VG to diag, seq, VG to intermediate and OM. Has had several PCIs. Most recent 04/01/22 with patent LIMA to LAD, VG to PDA is patent. VG to OM is patent, distal graft to insertion lesion is 80% stenosed and scoring balloon angioplasty was successfully completed.  10% residual stenosis.  Dr. Irish Lack did not believe an additional stent would be beneficial.  Could consider Drug eluding balloon if available in the event of further restenosis. Continue ASA and plavix.   3.  Mod AS. Monitor.  4.  HLD zetia was stopped, will check lipids in 4 weeks. Continue statin  5. HTN controlled, no change except increase of imdur.   Current medicines are reviewed with the patient today.  The patient Has no concerns regarding medicines.  The following changes have been made:  See above Labs/ tests ordered today include:see above  Disposition:   FU:  see above  Signed, Cecilie Kicks, NP  04/25/2022 2:53 PM    [facility]  [place] [place], [place], [place] Phone: [phone]; Fax: [phone]

## 2022-04-26 LAB — BASIC METABOLIC PANEL
BUN/Creatinine Ratio: 12 (ref 10–24)
BUN: 14 mg/dL (ref 8–27)
CO2: 21 mmol/L (ref 20–29)
Calcium: 9.7 mg/dL (ref 8.6–10.2)
Chloride: 102 mmol/L (ref 96–106)
Creatinine, Ser: 1.15 mg/dL (ref 0.76–1.27)
Glucose: 227 mg/dL — ABNORMAL HIGH (ref 70–99)
Potassium: 4.2 mmol/L (ref 3.5–5.2)
Sodium: 139 mmol/L (ref 134–144)
eGFR: 65 mL/min/{1.73_m2} (ref >59)

## 2022-05-25 ENCOUNTER — Ambulatory Visit (INDEPENDENT_AMBULATORY_CARE_PROVIDER_SITE_OTHER): Payer: Medicare Other | Admitting: Cardiovascular Disease

## 2022-05-25 ENCOUNTER — Encounter: Payer: Self-pay | Admitting: Cardiovascular Disease

## 2022-05-25 VITALS — BP 124/70 | HR 49 | Ht 69.0 in | Wt 204.0 lb

## 2022-05-25 DIAGNOSIS — I10 Essential (primary) hypertension: Secondary | ICD-10-CM

## 2022-05-25 DIAGNOSIS — E782 Mixed hyperlipidemia: Secondary | ICD-10-CM | POA: Diagnosis not present

## 2022-05-25 DIAGNOSIS — I25119 Atherosclerotic heart disease of native coronary artery with unspecified angina pectoris: Secondary | ICD-10-CM

## 2022-05-25 DIAGNOSIS — I350 Nonrheumatic aortic (valve) stenosis: Secondary | ICD-10-CM | POA: Diagnosis not present

## 2022-05-25 MED ORDER — NITROGLYCERIN 0.4 MG SL SUBL: SUBLINGUAL_TABLET | SUBLINGUAL | 5 refills | Status: DC

## 2022-05-25 NOTE — Patient Instructions (Signed)
Medication Instructions:  Your physician recommends that you continue on your current medications as directed. Please refer to the Current Medication list given to you today.  *If you need a refill on your cardiac medications before your next appointment, please call your pharmacy*   Lab Work: NONE If you have labs (blood work) drawn today and your tests are completely normal, you will receive your results only by: [facility] (if you have MyChart) OR A paper copy in the mail If you have any lab test that is abnormal or we need to change your treatment, we will call you to review the results.   Testing/Procedures: ECHO (in 6 months, prior to visit) Your physician has requested that you have an echocardiogram. Echocardiography is a painless test that uses sound waves to create images of your heart. It provides your doctor with information about the size and shape of your heart and how well your heart's chambers and valves are working. This procedure takes approximately one hour. There are no restrictions for this procedure.  Follow-Up: At [facility], you and your health needs are our priority.  As part of our continuing mission to provide you with exceptional heart care, we have created designated Provider Care Teams.  These Care Teams include your primary Cardiologist (physician) and Advanced Practice Providers (APPs -  Physician Assistants and Nurse Practitioners) who all work together to provide you with the care you need, when you need it.  Your next appointment:   6 month(s)  The format for your next appointment:   In Person  Provider:   Sherren Mocha, MD      Important Information About Sugar

## 2022-05-25 NOTE — Progress Notes (Signed)
Cardiology Office Note:    Date:  06/01/2022   ID:  [name] [name], DOB 1945/11/03, MRN [id number]  PCP:  Marda Stalker, [place] Prov[facility] Cardiologist:  Sherren Mocha, MD    Referring MD: Marda Stalker, PA-C   Chief Complaint  Patient presents with   Shortness of Breath    History of Present Illness:    [name] [name] is a 76 y.o. male with a hx of coronary and peripheral arterial disease, aortic stenosis, presenting for follow-up evaluation today.  He has undergone CABG and PCI procedures after bypass surgery.  He has had multiple PCI procedures over the past few years.  The patient underwent right common femoral endarterectomy in 2022 to treat severe common femoral artery stenosis and right leg claudication.  He underwent PCI in November 2022 and June 2023 with treatment of the saphenous vein graft to obtuse marginal for treatment of progressive anginal chest pain.  He is here alone today. He continues to have anginal chest discomfort when walking any long distance like he has to do at work. He premedicates with NTG at times. There is associated dyspnea with exertion. No resting chest pain or pain with minimal activity. No orthopnea, PND, or edema. He states that he didn't have any improvement in symptoms after his last PCI procedure.   Past Medical History:  Diagnosis Date   Aortic stenosis    Arrhythmia    Premature beats   Coronary atherosclerosis of artery bypass graft    CABG 2009, LHC 07/2016 restenosis of SVG to OM treated with DES   Diabetes mellitus ([place])    Essential hypertension, benign    Fatty liver    History of kidney stones    PAD (peripheral artery disease) ([place])    Pure hypercholesterolemia     Past Surgical History:  Procedure Laterality Date   ABDOMINAL AORTOGRAM W/LOWER EXTREMITY N/A 12/30/2020   Procedure: ABDOMINAL AORTOGRAM W/LOWER EXTREMITY;  Surgeon: Wellington Hampshire, MD;  Location: [facility];  Service:  Cardiovascular;  Laterality: N/A;   ADENOIDECTOMY     CARDIAC CATHETERIZATION N/A 07/18/2016   Procedure: Left Heart Cath and Cors/Grafts Angiography;  Surgeon: Burnell Blanks, MD;  Location: [facility];  Service: Cardiovascular;  Laterality: N/A;   CARDIAC CATHETERIZATION N/A 07/18/2016   Procedure: Coronary Stent Intervention;  Surgeon: Burnell Blanks, MD;  Location: [facility];  Service: Cardiovascular;  Laterality: N/A;   CHOLECYSTECTOMY     CORONARY ANGIOPLASTY WITH STENT PLACEMENT     CORONARY ARTERY BYPASS GRAFT     CORONARY BALLOON ANGIOPLASTY N/A 04/01/2022   Procedure: CORONARY BALLOON ANGIOPLASTY;  Surgeon: Jettie Booze, MD;  Location: [facility];  Service: Cardiovascular;  Laterality: N/A;  SVG-OM1   CORONARY BALLOON ANGIOPLASTY N/A 09/10/2021   Procedure: CORONARY BALLOON ANGIOPLASTY;  Surgeon: Sherren Mocha, MD;  Location: [facility];  Service: Cardiovascular;  Laterality: N/A;   CORONARY STENT INTERVENTION N/A 01/11/2019   Procedure: CORONARY STENT INTERVENTION;  Surgeon: Sherren Mocha, MD;  Location: [facility];  Service: Cardiovascular;  Laterality: N/A;   ENDARTERECTOMY FEMORAL Right 01/13/2021   Procedure: RIGHT FEMORAL ENDARTERECTOMY  WITH PATCH ANGIOPLASTY;  Surgeon: Rosetta Posner, MD;  Location: [facility];  Service: Vascular;  Laterality: Right;   INGUINAL HERNIA REPAIR Right    LEFT HEART CATH AND CORS/GRAFTS ANGIOGRAPHY N/A 01/11/2019   Procedure: LEFT HEART CATH AND CORS/GRAFTS ANGIOGRAPHY;  Surgeon: Sherren Mocha, MD;  Location: [facility];  Service: Cardiovascular;  Laterality: N/A;   LEFT HEART CATH AND CORS/GRAFTS ANGIOGRAPHY N/A 04/01/2022   Procedure: LEFT HEART CATH AND CORS/GRAFTS ANGIOGRAPHY;  Surgeon: Jettie Booze, MD;  Location: [facility];  Service: Cardiovascular;  Laterality: N/A;   LEFT HEART CATH AND CORS/GRAFTS ANGIOGRAPHY N/A 09/10/2021   Procedure: LEFT HEART CATH AND  CORS/GRAFTS ANGIOGRAPHY;  Surgeon: Sherren Mocha, MD;  Location: [facility];  Service: Cardiovascular;  Laterality: N/A;   Right femoral endarterectomy and Dacron patch angioplasty  01/13/3021   TONSILLECTOMY      Current Medications: Current Meds  Medication Sig   amLODipine (NORVASC) 10 MG tablet Take 10 mg by mouth daily.   aspirin 81 MG EC tablet TAKE 1 TABLET BY MOUTH EVERY DAY   benazepril (LOTENSIN) 40 MG tablet Take 40 mg by mouth daily.    cholecalciferol (VITAMIN D3) 25 MCG (1000 UNIT) tablet Take 1,000 Units by mouth daily.   clopidogrel (PLAVIX) 75 MG tablet TAKE 1 TABLET BY MOUTH EVERY DAY   ELDERBERRY PO Take 1 tablet by mouth daily.   finasteride (PROSCAR) 5 MG tablet Take 5 mg by mouth daily.   glucose blood (PRECISION QID TEST) test strip 1 each by Other route as directed.   hydrochlorothiazide (HYDRODIURIL) 25 MG tablet TAKE 1 TABLET BY MOUTH EVERY DAY   insulin NPH Human (NOVOLIN N) 100 UNIT/ML injection Inject 15 Units into the skin 2 (two) times daily before a meal.   Insulin Pen Needle (B-D UF III MINI PEN NEEDLES) 31G X 5 MM MISC Use to inject Ozempic once a week   isosorbide mononitrate (IMDUR) 60 MG 24 hr tablet Take 180 mg by mouth daily.   metFORMIN (GLUCOPHAGE) 1000 MG tablet Take 1,000 mg by mouth 2 (two) times daily with a meal.   metoprolol tartrate (LOPRESSOR) 50 MG tablet Take 1 tablet (50 mg total) by mouth 2 (two) times daily.   Multiple Vitamin (MULTIVITAMIN WITH MINERALS) TABS tablet Take 1 tablet by mouth daily.   phenazopyridine (PYRIDIUM) 100 MG tablet Take 100 mg by mouth daily. Urinary urgency   ranolazine (RANEXA) 1000 MG SR tablet Take 1 tablet (1,000 mg total) by mouth 2 (two) times daily.   rosuvastatin (CRESTOR) 20 MG tablet TAKE 1 TABLET BY MOUTH EVERY DAY   Zinc Gluconate 100 MG TABS Take 1 tablet by mouth daily.   [DISCONTINUED] nitroGLYCERIN (NITROSTAT) 0.4 MG SL tablet PLACE 1 TABLET UNDER THE TONGUE EVERY 5 MINUTES AS NEEDED  FOR CHEST PAIN.     Allergies:   Empagliflozin   Social History   Socioeconomic History   Marital status: Married    Spouse name: Not on file   Number of children: Not on file   Years of education: Not on file   Highest education level: Not on file  Occupational History   Not on file  Tobacco Use   Smoking status: Never   Smokeless tobacco: Never  Vaping Use   Vaping Use: Never used  Substance and Sexual Activity   Alcohol use: No   Drug use: No   Sexual activity: Not on file  Other Topics Concern   Not on file  Social History Narrative   Not on file   Social Determinants of Health   Financial Resource Strain: Not on file  Food Insecurity: Not on file  Transportation Needs: Not on file  Physical Activity: Not on file  Stress: Not on file  Social Connections:  Not on file     Family History: The patient's family history includes Heart disease in his mother. There is no history of Diabetes.  ROS:   Please see the history of present illness.    All other systems reviewed and are negative.  EKGs/Labs/Other Studies Reviewed:    The following studies were reviewed today: Echo 02/17/22: 1. Left ventricular ejection fraction, by estimation, is 65 to 70%. Left  ventricular ejection fraction by 3D volume is 68 %. The left ventricle has  normal function. The left ventricle has no regional wall motion  abnormalities. Left ventricular diastolic   parameters are consistent with Grade I diastolic dysfunction (impaired  relaxation).   2. Right ventricular systolic function is normal. The right ventricular  size is normal. Tricuspid regurgitation signal is inadequate for assessing  PA pressure.   3. The mitral valve is degenerative. No evidence of mitral valve  regurgitation. No evidence of mitral stenosis. Moderate mitral annular  calcification.   4. The aortic valve is calcified. There is severe calcifcation of the  aortic valve. There is severe thickening of the aortic  valve. Aortic valve  regurgitation is trivial. Moderate aortic valve stenosis. Aortic valve  area, by VTI measures 1.56 cm.  Aortic valve mean gradient measures 24.2 mmHg. Aortic valve Vmax measures  3.29 m/s.   5. Aortic dilatation noted. There is borderline dilatation of the  ascending aorta, measuring 37 mm.   6. The inferior vena cava is normal in size with greater than 50%  respiratory variability, suggesting right atrial pressure of 3 mmHg.   7. Compared to study dated 08/09/2021, the mean AVG has increased from 20  to 37mhg, AV VMax has increased from 3.03 to 3.267m and DVI has decresed  from 0.34 to 0.29.   FINDINGS   Left Ventricle: Left ventricular ejection fraction, by estimation, is 65  to 70%. Left ventricular ejection fraction by 3D volume is 68 %. The left  ventricle has normal function. The left ventricle has no regional wall  motion abnormalities. The left  ventricular internal cavity size was normal in size. There is no left  ventricular hypertrophy. Left ventricular diastolic parameters are  consistent with Grade I diastolic dysfunction (impaired relaxation).  Normal left ventricular filling pressure.   Right Ventricle: The right ventricular size is normal. No increase in  right ventricular wall thickness. Right ventricular systolic function is  normal. Tricuspid regurgitation signal is inadequate for assessing PA  pressure.   Left Atrium: Left atrial size was normal in size.   Right Atrium: Right atrial size was normal in size.   Pericardium: There is no evidence of pericardial effusion.   Mitral Valve: The mitral valve is degenerative in appearance. There is  mild calcification of the anterior mitral valve leaflet(s). Moderate  mitral annular calcification. No evidence of mitral valve regurgitation.  No evidence of mitral valve stenosis.   Tricuspid Valve: The tricuspid valve is normal in structure. Tricuspid  valve regurgitation is not demonstrated. No  evidence of tricuspid  stenosis.   Aortic Valve: The aortic valve is calcified. There is severe calcifcation  of the aortic valve. There is severe thickening of the aortic valve.  Aortic valve regurgitation is trivial. Moderate aortic stenosis is  present. Aortic valve mean gradient measures   24.2 mmHg. Aortic valve peak gradient measures 43.2 mmHg. Aortic valve  area, by VTI measures 1.56 cm.   Pulmonic Valve: The pulmonic valve was normal in structure. Pulmonic valve  regurgitation is trivial.  No evidence of pulmonic stenosis.   Aorta: Aortic dilatation noted. There is borderline dilatation of the  ascending aorta, measuring 37 mm.   Venous: The inferior vena cava is normal in size with greater than 50%  respiratory variability, suggesting right atrial pressure of 3 mmHg.   IAS/Shunts: The interatrial septum appears to be lipomatous. No atrial  level shunt detected by color flow Doppler.   EKG:  EKG is ordered today.  The ekg ordered today demonstrates sinus bradycardia 49 bpm, nonspecific ST-T changes  Recent Labs: 03/30/2022: ALT 24 03/31/2022: Hemoglobin 12.0; Platelets 186 04/25/2022: BUN 14; Creatinine, Ser 1.15; Potassium 4.2; Sodium 139  Recent Lipid Panel    Component Value Date/Time   CHOL 109 01/14/2021 0105   CHOL 129 04/27/2020 0824   TRIG 131 01/14/2021 0105   HDL 44 01/14/2021 0105   HDL 39 (L) 04/27/2020 0824   CHOLHDL 2.5 01/14/2021 0105   VLDL 26 01/14/2021 0105   LDLCALC 39 01/14/2021 0105   LDLCALC 63 04/27/2020 0824     Risk Assessment/Calculations:           Physical Exam:    VS:  BP 124/70   Pulse (!) 49   Ht 5' 9"  (1.753 m)   Wt 204 lb (92.5 kg)   SpO2 98%   BMI 30.13 kg/m     Wt Readings from Last 3 Encounters:  05/25/22 204 lb (92.5 kg)  04/25/22 202 lb 9.6 oz (91.9 kg)  04/01/22 195 lb 12.8 oz (88.8 kg)     GEN:  Well nourished, well developed in no acute distress HEENT: Normal NECK: No JVD; No carotid bruits LYMPHATICS: No  lymphadenopathy CARDIAC: RRR, 3/6 mid peaking crescendo decrescendo murmur at the RUSB RESPIRATORY:  Clear to auscultation without rales, wheezing or rhonchi  ABDOMEN: Soft, non-tender, non-distended MUSCULOSKELETAL:  No edema; No deformity  SKIN: Warm and dry NEUROLOGIC:  Alert and oriented x 3 PSYCHIATRIC:  Normal affect   ASSESSMENT:    1. Coronary artery disease involving native coronary artery of native heart with angina pectoris ([place])   2. Nonrheumatic aortic valve stenosis   3. Essential hypertension   4. Mixed hyperlipidemia    PLAN:    In order of problems listed above:  The patient continues to have CCS Class 2 angina on maximally tolerated medical therapy. Will continue his current medical program. I don't think he will benefit from further PCI procedures at this point and he is known to have diffuse distal vessel disease.  Exam ad echo findings are consistent with moderate AS. Will continue with a surveillance program to include clinical follow-up and serial echo studies.  BP controlled on current Rx.  Lipids at goal on rosuvastatin           Medication Adjustments/Labs and Tests Ordered: Current medicines are reviewed at length with the patient today.  Concerns regarding medicines are outlined above.  Orders Placed This Encounter  Procedures   EKG 12-Lead   ECHOCARDIOGRAM COMPLETE   Meds ordered this encounter  Medications   nitroGLYCERIN (NITROSTAT) 0.4 MG SL tablet    Sig: Dissolve 1 tablet under the tongue every 5 minutes as needed for chest pain. Max of 3 doses, then 911.    Dispense:  25 tablet    Refill:  5    Patient Instructions  Medication Instructions:  Your physician recommends that you continue on your current medications as directed. Please refer to the Current Medication list given to you today.  *If you  need a refill on your cardiac medications before your next appointment, please call your pharmacy*   Lab Work: NONE If you have labs  (blood work) drawn today and your tests are completely normal, you will receive your results only by: [place] (if you have MyChart) OR A paper copy in the mail If you have any lab test that is abnormal or we need to change your treatment, we will call you to review the results.   Testing/Procedures: ECHO (in 6 months, prior to visit) Your physician has requested that you have an echocardiogram. Echocardiography is a painless test that uses sound waves to create images of your heart. It provides your doctor with information about the size and shape of your heart and how well your heart's chambers and valves are working. This procedure takes approximately one hour. There are no restrictions for this procedure.  Follow-Up: At [facility], you and your health needs are our priority.  As part of our continuing mission to provide you with exceptional heart care, we have created designated Provider Care Teams.  These Care Teams include your primary Cardiologist (physician) and Advanced Practice Providers (APPs -  Physician Assistants and Nurse Practitioners) who all work together to provide you with the care you need, when you need it.  Your next appointment:   6 month(s)  The format for your next appointment:   In Person  Provider:   Sherren Mocha, MD      Important Information About Sugar         Signed, Sherren Mocha, MD  06/01/2022 10:59 PM    [place]

## 2022-05-30 ENCOUNTER — Encounter (HOSPITAL_COMMUNITY): Payer: Self-pay | Admitting: Cardiovascular Disease

## 2022-06-01 ENCOUNTER — Encounter: Payer: Self-pay | Admitting: Cardiovascular Disease

## 2022-07-15 ENCOUNTER — Observation Stay (HOSPITAL_COMMUNITY)
Admission: EM | Admit: 2022-07-15 | Discharge: 2022-07-16 | Disposition: A | Discharge status: Home / Self Care | Payer: Medicare Other | Attending: Cardiovascular Disease | Admitting: Cardiovascular Disease

## 2022-07-15 ENCOUNTER — Other Ambulatory Visit: Payer: Self-pay

## 2022-07-15 ENCOUNTER — Emergency Department (HOSPITAL_COMMUNITY): Payer: Medicare Other

## 2022-07-15 ENCOUNTER — Telehealth: Payer: Self-pay | Admitting: Cardiovascular Disease

## 2022-07-15 ENCOUNTER — Encounter (HOSPITAL_COMMUNITY): Payer: Self-pay | Admitting: *Deleted

## 2022-07-15 DIAGNOSIS — I25110 Atherosclerotic heart disease of native coronary artery with unstable angina pectoris: Secondary | ICD-10-CM | POA: Insufficient documentation

## 2022-07-15 DIAGNOSIS — Z951 Presence of aortocoronary bypass graft: Secondary | ICD-10-CM | POA: Insufficient documentation

## 2022-07-15 DIAGNOSIS — Z955 Presence of coronary angioplasty implant and graft: Secondary | ICD-10-CM | POA: Diagnosis not present

## 2022-07-15 DIAGNOSIS — I350 Nonrheumatic aortic (valve) stenosis: Secondary | ICD-10-CM | POA: Diagnosis not present

## 2022-07-15 DIAGNOSIS — I200 Unstable angina: Secondary | ICD-10-CM | POA: Diagnosis not present

## 2022-07-15 DIAGNOSIS — E119 Type 2 diabetes mellitus without complications: Secondary | ICD-10-CM | POA: Diagnosis not present

## 2022-07-15 DIAGNOSIS — Z794 Long term (current) use of insulin: Secondary | ICD-10-CM | POA: Diagnosis not present

## 2022-07-15 DIAGNOSIS — R079 Chest pain, unspecified: Secondary | ICD-10-CM | POA: Diagnosis not present

## 2022-07-15 DIAGNOSIS — Z7982 Long term (current) use of aspirin: Secondary | ICD-10-CM | POA: Insufficient documentation

## 2022-07-15 DIAGNOSIS — I739 Peripheral vascular disease, unspecified: Secondary | ICD-10-CM | POA: Diagnosis present

## 2022-07-15 DIAGNOSIS — Z79899 Other long term (current) drug therapy: Secondary | ICD-10-CM | POA: Insufficient documentation

## 2022-07-15 DIAGNOSIS — I10 Essential (primary) hypertension: Secondary | ICD-10-CM | POA: Diagnosis not present

## 2022-07-15 DIAGNOSIS — E7800 Pure hypercholesterolemia, unspecified: Secondary | ICD-10-CM | POA: Diagnosis present

## 2022-07-15 DIAGNOSIS — Z7902 Long term (current) use of antithrombotics/antiplatelets: Secondary | ICD-10-CM | POA: Diagnosis not present

## 2022-07-15 DIAGNOSIS — I25810 Atherosclerosis of coronary artery bypass graft(s) without angina pectoris: Secondary | ICD-10-CM | POA: Diagnosis present

## 2022-07-15 DIAGNOSIS — Z7984 Long term (current) use of oral hypoglycemic drugs: Secondary | ICD-10-CM | POA: Diagnosis not present

## 2022-07-15 DIAGNOSIS — I201 Angina pectoris with documented spasm: Secondary | ICD-10-CM | POA: Diagnosis not present

## 2022-07-15 DIAGNOSIS — R0789 Other chest pain: Secondary | ICD-10-CM | POA: Diagnosis not present

## 2022-07-15 LAB — CBC WITH DIFFERENTIAL/PLATELET
Abs Immature Granulocytes: 0.04 10*3/uL (ref 0.00–0.07)
Basophils Absolute: 0.1 10*3/uL (ref 0.0–0.1)
Basophils Relative: 1 %
Eosinophils Absolute: 0.3 10*3/uL (ref 0.0–0.5)
Eosinophils Relative: 4 %
HCT: 39.9 % (ref 39.0–52.0)
Hemoglobin: 13.1 g/dL (ref 13.0–17.0)
Immature Granulocytes: 1 %
Lymphocytes Relative: 18 %
Lymphs Abs: 1.3 10*3/uL (ref 0.7–4.0)
MCH: 29.6 pg (ref 26.0–34.0)
MCHC: 32.8 g/dL (ref 30.0–36.0)
MCV: 90.3 fL (ref 80.0–100.0)
Monocytes Absolute: 0.6 10*3/uL (ref 0.1–1.0)
Monocytes Relative: 8 %
Neutro Abs: 4.9 10*3/uL (ref 1.7–7.7)
Neutrophils Relative %: 68 %
Platelets: 234 10*3/uL (ref 150–400)
RBC: 4.42 MIL/uL (ref 4.22–5.81)
RDW: 13.1 % (ref 11.5–15.5)
WBC: 7.1 10*3/uL (ref 4.0–10.5)
nRBC: 0 % (ref 0.0–0.2)

## 2022-07-15 LAB — CBC
HCT: 39.9 % (ref 39.0–52.0)
Hemoglobin: 13.4 g/dL (ref 13.0–17.0)
MCH: 29.7 pg (ref 26.0–34.0)
MCHC: 33.6 g/dL (ref 30.0–36.0)
MCV: 88.5 fL (ref 80.0–100.0)
Platelets: 258 10*3/uL (ref 150–400)
RBC: 4.51 MIL/uL (ref 4.22–5.81)
RDW: 13.1 % (ref 11.5–15.5)
WBC: 7.3 10*3/uL (ref 4.0–10.5)
nRBC: 0 % (ref 0.0–0.2)

## 2022-07-15 LAB — MAGNESIUM: Magnesium: 1.8 mg/dL (ref 1.7–2.4)

## 2022-07-15 LAB — TROPONIN I (HIGH SENSITIVITY)
Troponin I (High Sensitivity): 10 ng/L (ref <18)
Troponin I (High Sensitivity): 13 ng/L (ref <18)

## 2022-07-15 LAB — BASIC METABOLIC PANEL
Anion gap: 10 (ref 5–15)
BUN: 15 mg/dL (ref 8–23)
CO2: 21 mmol/L — ABNORMAL LOW (ref 22–32)
Calcium: 9.5 mg/dL (ref 8.9–10.3)
Chloride: 107 mmol/L (ref 98–111)
Creatinine, Ser: 1.18 mg/dL (ref 0.61–1.24)
GFR, Estimated: >60 mL/min (ref >60)
Glucose, Bld: 211 mg/dL — ABNORMAL HIGH (ref 70–99)
Potassium: 4.2 mmol/L (ref 3.5–5.1)
Sodium: 138 mmol/L (ref 135–145)

## 2022-07-15 LAB — CREATININE, SERUM
Creatinine, Ser: 1.12 mg/dL (ref 0.61–1.24)
GFR, Estimated: >60 mL/min (ref >60)

## 2022-07-15 LAB — HEMOGLOBIN A1C
Hgb A1c MFr Bld: 7.1 % — ABNORMAL HIGH (ref 4.8–5.6)
Mean Plasma Glucose: 157.07 mg/dL

## 2022-07-15 MED ORDER — METOPROLOL TARTRATE 50 MG PO TABS
75.0000 mg | ORAL_TABLET | Freq: Two times a day (BID) | ORAL | Status: DC
  Administered 2022-07-15 – 2022-07-16 (×2): 75 mg via ORAL
  Filled 2022-07-15 (×2): qty 1

## 2022-07-15 MED ORDER — INSULIN ASPART 100 UNIT/ML IJ SOLN: 0.0000 [IU] | Freq: Three times a day (TID) | INTRAMUSCULAR | Status: DC

## 2022-07-15 MED ORDER — FINASTERIDE 5 MG PO TABS
5.0000 mg | ORAL_TABLET | Freq: Every day | ORAL | Status: DC
  Administered 2022-07-15 – 2022-07-16 (×2): 5 mg via ORAL
  Filled 2022-07-15 (×2): qty 1

## 2022-07-15 MED ORDER — RANOLAZINE ER 500 MG PO TB12
1000.0000 mg | ORAL_TABLET | Freq: Two times a day (BID) | ORAL | Status: DC
  Administered 2022-07-15 – 2022-07-16 (×2): 1000 mg via ORAL
  Filled 2022-07-15 (×2): qty 2

## 2022-07-15 MED ORDER — ASPIRIN 81 MG PO TBEC
81.0000 mg | DELAYED_RELEASE_TABLET | Freq: Every day | ORAL | Status: DC
  Administered 2022-07-15 – 2022-07-16 (×2): 81 mg via ORAL
  Filled 2022-07-15 (×2): qty 1

## 2022-07-15 MED ORDER — INSULIN ASPART PROT & ASPART (70-30 MIX) 100 UNIT/ML ~~LOC~~ SUSP
10.0000 [IU] | Freq: Every day | SUBCUTANEOUS | Status: DC
  Administered 2022-07-15: 10 [IU] via SUBCUTANEOUS
  Filled 2022-07-15: qty 10

## 2022-07-15 MED ORDER — ACETAMINOPHEN 325 MG PO TABS: 650.0000 mg | ORAL_TABLET | ORAL | Status: DC | PRN

## 2022-07-15 MED ORDER — HEPARIN SODIUM (PORCINE) 5000 UNIT/ML IJ SOLN
5000.0000 [IU] | Freq: Three times a day (TID) | INTRAMUSCULAR | Status: DC
  Administered 2022-07-15 – 2022-07-16 (×2): 5000 [IU] via SUBCUTANEOUS
  Filled 2022-07-15 (×2): qty 1

## 2022-07-15 MED ORDER — ONDANSETRON HCL 4 MG/2ML IJ SOLN: 4.0000 mg | Freq: Four times a day (QID) | INTRAMUSCULAR | Status: DC | PRN

## 2022-07-15 MED ORDER — HYDROCHLOROTHIAZIDE 25 MG PO TABS
25.0000 mg | ORAL_TABLET | Freq: Every day | ORAL | Status: DC
  Administered 2022-07-15 – 2022-07-16 (×2): 25 mg via ORAL
  Filled 2022-07-15 (×2): qty 1

## 2022-07-15 MED ORDER — BENAZEPRIL HCL 20 MG PO TABS
40.0000 mg | ORAL_TABLET | Freq: Every day | ORAL | Status: DC
  Administered 2022-07-15 – 2022-07-16 (×2): 40 mg via ORAL
  Filled 2022-07-15 (×2): qty 2

## 2022-07-15 MED ORDER — NITROGLYCERIN 0.4 MG SL SUBL: 0.4000 mg | SUBLINGUAL_TABLET | SUBLINGUAL | Status: DC | PRN

## 2022-07-15 MED ORDER — ROSUVASTATIN CALCIUM 20 MG PO TABS
20.0000 mg | ORAL_TABLET | Freq: Every day | ORAL | Status: DC
  Administered 2022-07-15 – 2022-07-16 (×2): 20 mg via ORAL
  Filled 2022-07-15 (×2): qty 1

## 2022-07-15 MED ORDER — ISOSORBIDE MONONITRATE ER 30 MG PO TB24
90.0000 mg | ORAL_TABLET | Freq: Two times a day (BID) | ORAL | Status: DC
  Administered 2022-07-15 – 2022-07-16 (×2): 90 mg via ORAL
  Filled 2022-07-15 (×2): qty 3

## 2022-07-15 MED ORDER — AMLODIPINE BESYLATE 10 MG PO TABS
10.0000 mg | ORAL_TABLET | Freq: Every day | ORAL | Status: DC
  Administered 2022-07-15 – 2022-07-16 (×2): 10 mg via ORAL
  Filled 2022-07-15 (×2): qty 1

## 2022-07-15 MED ORDER — PHENAZOPYRIDINE HCL 100 MG PO TABS
100.0000 mg | ORAL_TABLET | Freq: Every day | ORAL | Status: DC
  Administered 2022-07-15: 100 mg via ORAL
  Filled 2022-07-15 (×5): qty 1

## 2022-07-15 MED ORDER — INSULIN ASPART 100 UNIT/ML IJ SOLN
0.0000 [IU] | Freq: Three times a day (TID) | INTRAMUSCULAR | Status: DC
  Administered 2022-07-16: 3 [IU] via SUBCUTANEOUS
  Administered 2022-07-16: 5 [IU] via SUBCUTANEOUS

## 2022-07-15 MED ORDER — CLOPIDOGREL BISULFATE 75 MG PO TABS
75.0000 mg | ORAL_TABLET | Freq: Every day | ORAL | Status: DC
  Administered 2022-07-15 – 2022-07-16 (×2): 75 mg via ORAL
  Filled 2022-07-15 (×2): qty 1

## 2022-07-15 NOTE — ED Provider Triage Note (Signed)
Emergency Medicine Provider Triage Evaluation Note  [name] [name] , a 76 y.o. male  was evaluated in triage.  Pt complains of chest pain with minimal exertion.  Patient has history of CAD and is status post CABG, and PCI.  He states today he noticed minimal activity causing chest pain.  Such as walking to the bathroom.  Chest pain does resolve with nitro.  Currently chest pain-free.  Has received nitro and aspirin prior to arrival.  Review of Systems  Positive: As above Negative: As above  Physical Exam  BP (!) 165/86 (BP Location: Right Arm)   Pulse 64   Temp 98.4 F (36.9 C) (Oral)   Resp 16   Ht 5' 9"  (1.753 m)   Wt 91.2 kg   SpO2 100%   BMI 29.68 kg/m  Gen:   Awake, no distress   Resp:  Normal effort  MSK:   Moves extremities without difficulty  Other:    Medical Decision Making  Medically screening exam initiated at 9:44 AM.  Appropriate orders placed.  [name] [name] was informed that the remainder of the evaluation will be completed by another provider, this initial triage assessment does not replace that evaluation, and the importance of remaining in the ED until their evaluation is complete.     Evlyn Courier, PA-C 07/15/22 0945

## 2022-07-15 NOTE — ED Provider Notes (Signed)
[facility] EMERGENCY DEPARTMENT Provider Note   CSN: [id number] Arrival date & time: 07/15/22  2482     History  Chief Complaint  Patient presents with   Chest Pain    [name] [name] is a 76 y.o. male.  76 yo M with a chief complaint of chest pain.  This been an ongoing issue for him.  He actually was admitted back in June with similar symptoms.  Ended up having a cardiac catheterization done and had an angioplasty.  Since then he feels like his pain had not gone away but only occurred with significant exertion.  Since then it has become more prevalent and occurring with even minimal tasks.  He has been taking more nitroglycerin than he thinks is reasonable and decided to come to the emergency department for evaluation.   Chest Pain      Home Medications Prior to Admission medications   Medication Sig Start Date End Date Taking? Authorizing Provider  amLODipine (NORVASC) 10 MG tablet Take 10 mg by mouth daily.    [provider]  aspirin 81 MG EC tablet TAKE 1 TABLET BY MOUTH EVERY DAY 02/23/21   Sherren Mocha, MD  benazepril (LOTENSIN) 40 MG tablet Take 40 mg by mouth daily.  04/30/12   [provider]  cholecalciferol (VITAMIN D3) 25 MCG (1000 UNIT) tablet Take 1,000 Units by mouth daily.    [provider]  clopidogrel (PLAVIX) 75 MG tablet TAKE 1 TABLET BY MOUTH EVERY DAY 01/17/22   Sherren Mocha, MD  ELDERBERRY PO Take 1 tablet by mouth daily.    [provider]  finasteride (PROSCAR) 5 MG tablet Take 5 mg by mouth daily. 03/06/22   [provider]  glucose blood (PRECISION QID TEST) test strip 1 each by Other route as directed. 08/03/20   [provider]  hydrochlorothiazide (HYDRODIURIL) 25 MG tablet TAKE 1 TABLET BY MOUTH EVERY DAY 09/24/21   Sherren Mocha, MD  insulin NPH Human (NOVOLIN N) 100 UNIT/ML injection Inject 15 Units into the skin 2 (two) times daily before a meal.    [provider]  Insulin Pen Needle (B-D UF III MINI PEN NEEDLES) 31G X 5 MM MISC Use to inject Ozempic once a week 01/02/21   [provider]  isosorbide mononitrate (IMDUR) 60 MG 24 hr tablet Take 180 mg by mouth daily.    [provider]  metFORMIN (GLUCOPHAGE) 1000 MG tablet Take 1,000 mg by mouth 2 (two) times daily with a meal. 05/21/19   [provider]  metoprolol tartrate (LOPRESSOR) 50 MG tablet Take 1 tablet (50 mg total) by mouth 2 (two) times daily. 11/29/21   Deberah Pelton, NP  Multiple Vitamin (MULTIVITAMIN WITH MINERALS) TABS tablet Take 1 tablet by mouth daily.    [provider]  nitroGLYCERIN (NITROSTAT) 0.4 MG SL tablet Dissolve 1 tablet under the tongue every 5 minutes as needed for chest pain. Max of 3 doses, then 911. 05/25/22   Sherren Mocha, MD  phenazopyridine (PYRIDIUM) 100 MG tablet Take 100 mg by mouth daily. Urinary urgency 10/05/21   [provider]  ranolazine (RANEXA) 1000 MG SR tablet Take 1 tablet (1,000 mg total) by mouth 2 (two) times daily. 04/25/22 04/26/23  Isaiah Serge, NP  rosuvastatin (CRESTOR) 20 MG tablet TAKE 1 TABLET BY MOUTH EVERY DAY 03/21/22   Sherren Mocha, MD  Zinc Gluconate 100 MG TABS Take 1 tablet by mouth daily.    [provider]      Allergies    Empagliflozin    Review of Systems   Review of Systems  Cardiovascular:  Positive for chest pain.    Physical Exam Updated Vital Signs BP (!) 174/79 (BP Location: Right Arm)   Pulse 63   Temp 98 F (36.7 C) (Oral)   Resp 17   Ht 5' 9"  (1.753 m)   Wt 91.2 kg   SpO2 99%   BMI 29.68 kg/m  Physical Exam Vitals and nursing note reviewed.  Constitutional:      Appearance: He is well-developed.  HENT:     Head: Normocephalic and atraumatic.  Eyes:     Pupils: Pupils are equal, round, and reactive to light.  Neck:     Vascular: No JVD.  Cardiovascular:     Rate and Rhythm: Normal rate and regular rhythm.     Heart sounds: No murmur heard.     No friction rub. No gallop.  Pulmonary:     Effort: No respiratory distress.     Breath sounds: No wheezing.  Abdominal:     General: There is no distension.     Tenderness: There is no abdominal tenderness. There is no guarding or rebound.  Musculoskeletal:        General: Normal range of motion.     Cervical back: Normal range of motion and neck supple.  Skin:    Coloration: Skin is not pale.     Findings: No rash.  Neurological:     Mental Status: He is alert and oriented to person, place, and time.  Psychiatric:        Behavior: Behavior normal.     ED Results / Procedures / Treatments   Labs (all labs ordered are listed, but only abnormal results are displayed) Labs Reviewed  BASIC METABOLIC PANEL - Abnormal; Notable for the following components:      Result Value   CO2 21 (*)    Glucose, Bld 211 (*)    All other components within normal limits  CBC WITH DIFFERENTIAL/PLATELET  MAGNESIUM  TROPONIN I (HIGH SENSITIVITY)  TROPONIN I (HIGH SENSITIVITY)    EKG EKG Interpretation  Date/Time:  Friday July 15 2022 09:34:57 EDT Ventricular Rate:  71 PR Interval:  180 QRS Duration: 96 QT Interval:  414 QTC Calculation: 449 R Axis:   52 Text Interpretation: Sinus rhythm with occasional Premature ventricular complexes Nonspecific ST abnormality Abnormal ECG st depression in the inferior and lateral leeds, seen on prior though not most recent Confirmed by Deno Etienne [phone]) on 07/15/2022 3:10:52 PM  Radiology DG Chest 2 View  Result Date: 07/15/2022 CLINICAL DATA:  Chest pain. EXAM: CHEST - 2 VIEW COMPARISON:  March 30, 2022. FINDINGS: The heart size and mediastinal contours are within normal limits. Status post coronary bypass graft. Both lungs are clear. The visualized skeletal structures are unremarkable. IMPRESSION: No active cardiopulmonary disease. Electronically Signed   By: Marijo Conception M.D.   On: 07/15/2022 10:16    Procedures Procedures    Medications  Ordered in ED Medications - No data to display  ED Course/ Medical Decision Making/ A&P                           Medical Decision Making Risk Decision regarding hospitalization.   76 yo M with a significant past medical history of multiple cardiac catheterizations and MIs in the past comes in with a chief complaints of what  sounds like unstable angina.  Patient with worsening chest pain typical of his angina occurring with minimal exertion.  His initial troponin is negative.  His EKG shows ST depressions which is similar to when he came in in June.  Will discuss with cardiology.  Discussed case with Dr. Johnsie Cancel, cardiology evaluated the patient at bedside and will admit to the hospital.  CRITICAL CARE Performed by: Cecilio Asper   Total critical care time: 35 minutes  Critical care time was exclusive of separately billable procedures and treating other patients.  Critical care was necessary to treat or prevent imminent or life-threatening deterioration.  Critical care was time spent personally by me on the following activities: development of treatment plan with patient and/or surrogate as well as nursing, discussions with consultants, evaluation of patient's response to treatment, examination of patient, obtaining history from patient or surrogate, ordering and performing treatments and interventions, ordering and review of laboratory studies, ordering and review of radiographic studies, pulse oximetry and re-evaluation of patient's condition.  The patients results and plan were reviewed and discussed.   Any x-rays performed were independently reviewed by myself.   Differential diagnosis were considered with the presenting HPI.  Medications - No data to display  Vitals:   07/15/22 0932 07/15/22 0942 07/15/22 1112 07/15/22 1312  BP: (!) 165/86  (!) 166/85 (!) 174/79  Pulse: 64  62 63  Resp: 16  15 17   Temp: 98.4 F (36.9 C)  98.3 F (36.8 C) 98 F (36.7 C)  TempSrc:  Oral  Oral Oral  SpO2: 100%  100% 99%  Weight:  91.2 kg    Height:  5' 9"  (1.753 m)      Final diagnoses:  Unstable angina (HCC)    Admission/ observation were discussed with the admitting physician, patient and/or family and they are comfortable with the plan.          Final Clinical Impression(s) / ED Diagnoses Final diagnoses:  Unstable angina [facility]    Rx / DC Orders ED Discharge Orders     None         Deno Etienne, DO 07/15/22 1607

## 2022-07-15 NOTE — Telephone Encounter (Signed)
Returned call to patient who states he was having chest pain so went to ER. He is still in waiting room at time of call, but has had EKG, CXR, and labs drawn. He states "it's not my first rodeo." Told him he's where he needs to be. No questions or concerns.

## 2022-07-15 NOTE — H&P (Signed)
Physician History and Physical     Patient ID: [name] [name] MRN: [id number] DOB/AGE: 1946-03-08 76 y.o. Admit date: 07/15/2022  Primary Care Physician: Marda Stalker, PA-C Primary Cardiologist: [name]  Active Problems:   * No active hospital problems. *   HPI:  76 y.o.with known moderate AS and CAD. Post CABG  2009 with multiple interventions to distal anastamosis of SVG to OM. Last cath done 04/01/22 difficult from left arm by Dr Irish Lack. Had ballon Rx scoring no repeat stent as he already has multiple layers and has significant vein graft native vessel mismatch. He also has severe diffuse distal LAD disease and distal RCA disease after graft insertion that can cause angina He has chronic angina Pre medicates with SL nitro before heavy exertion Last 2 weeks feels things are worse with angina after only 25-50 feet No rest pain. ECG non acute and troponins negative in ER. Echo 02/17/22 with normal EF 65-70% Moderate AS mean gradient 24 peak 43 mmHg AVA 1.4 DVI 0.29.    He also has PVD with previous endarterectomy for right femoral with patch angioplasty March 2022 by Dr Early  Review of systems complete and found to be negative unless listed above   Past Medical History:  Diagnosis Date   Aortic stenosis    Arrhythmia    Premature beats   Coronary atherosclerosis of artery bypass graft    CABG 2009, LHC 07/2016 restenosis of SVG to OM treated with DES   Diabetes mellitus ([place])    Essential hypertension, benign    Fatty liver    History of kidney stones    PAD (peripheral artery disease) ([place])    Pure hypercholesterolemia     Family History  Problem Relation Age of Onset   Heart disease Mother        No details   Diabetes Neg Hx     Social History   Socioeconomic History   Marital status: Married    Spouse name: Not on file   Number of children: Not on file   Years of education: Not on file   Highest education level: Not on file  Occupational History   Not on file   Tobacco Use   Smoking status: Never   Smokeless tobacco: Never  Vaping Use   Vaping Use: Never used  Substance and Sexual Activity   Alcohol use: No   Drug use: No   Sexual activity: Not on file  Other Topics Concern   Not on file  Social History Narrative   Not on file   Social Determinants of Health   Financial Resource Strain: Not on file  Food Insecurity: Not on file  Transportation Needs: Not on file  Physical Activity: Not on file  Stress: Not on file  Social Connections: Not on file  Intimate Partner Violence: Not on file    Past Surgical History:  Procedure Laterality Date   ABDOMINAL AORTOGRAM W/LOWER EXTREMITY N/A 12/30/2020   Procedure: ABDOMINAL AORTOGRAM W/LOWER EXTREMITY;  Surgeon: Wellington Hampshire, MD;  Location: [facility];  Service: Cardiovascular;  Laterality: N/A;   ADENOIDECTOMY     CARDIAC CATHETERIZATION N/A 07/18/2016   Procedure: Left Heart Cath and Cors/Grafts Angiography;  Surgeon: Burnell Blanks, MD;  Location: [facility];  Service: Cardiovascular;  Laterality: N/A;   CARDIAC CATHETERIZATION N/A 07/18/2016   Procedure: Coronary Stent Intervention;  Surgeon: Burnell Blanks, MD;  Location: [facility];  Service: Cardiovascular;  Laterality: N/A;   CHOLECYSTECTOMY  CORONARY ANGIOPLASTY WITH STENT PLACEMENT     CORONARY ARTERY BYPASS GRAFT     CORONARY BALLOON ANGIOPLASTY N/A 04/01/2022   Procedure: CORONARY BALLOON ANGIOPLASTY;  Surgeon: Jettie Booze, MD;  Location: [facility];  Service: Cardiovascular;  Laterality: N/A;  SVG-OM1   CORONARY BALLOON ANGIOPLASTY N/A 09/10/2021   Procedure: CORONARY BALLOON ANGIOPLASTY;  Surgeon: Sherren Mocha, MD;  Location: [facility];  Service: Cardiovascular;  Laterality: N/A;   CORONARY STENT INTERVENTION N/A 01/11/2019   Procedure: CORONARY STENT INTERVENTION;  Surgeon: Sherren Mocha, MD;  Location: [facility];  Service: Cardiovascular;   Laterality: N/A;   ENDARTERECTOMY FEMORAL Right 01/13/2021   Procedure: RIGHT FEMORAL ENDARTERECTOMY  WITH PATCH ANGIOPLASTY;  Surgeon: Rosetta Posner, MD;  Location: [facility];  Service: Vascular;  Laterality: Right;   INGUINAL HERNIA REPAIR Right    LEFT HEART CATH AND CORS/GRAFTS ANGIOGRAPHY N/A 01/11/2019   Procedure: LEFT HEART CATH AND CORS/GRAFTS ANGIOGRAPHY;  Surgeon: Sherren Mocha, MD;  Location: [facility];  Service: Cardiovascular;  Laterality: N/A;   LEFT HEART CATH AND CORS/GRAFTS ANGIOGRAPHY N/A 04/01/2022   Procedure: LEFT HEART CATH AND CORS/GRAFTS ANGIOGRAPHY;  Surgeon: Jettie Booze, MD;  Location: [facility];  Service: Cardiovascular;  Laterality: N/A;   LEFT HEART CATH AND CORS/GRAFTS ANGIOGRAPHY N/A 09/10/2021   Procedure: LEFT HEART CATH AND CORS/GRAFTS ANGIOGRAPHY;  Surgeon: Sherren Mocha, MD;  Location: [facility];  Service: Cardiovascular;  Laterality: N/A;   Right femoral endarterectomy and Dacron patch angioplasty  01/13/3021   TONSILLECTOMY       (Not in a hospital admission)   Physical Exam: Blood pressure (!) 174/79, pulse 63, temperature 98 F (36.7 C), temperature source Oral, resp. rate 17, height 5' 9"  (1.753 m), weight 91.2 kg, SpO2 99 %.   Affect appropriate Healthy:  appears stated age 38: normal Neck supple with no adenopathy JVP normal no bruits no thyromegaly Lungs clear with no wheezing and good diaphragmatic motion Heart:  S1/S2 preserved moderate AS murmur, no rub, gallop or click PMI normal Abdomen: benighn, BS positve, no tenderness, no AAA no bruit.  No HSM or HJR Distal pulses intact  scar from right femoral surgery  No edema Neuro non-focal Skin warm and dry No muscular weakness  No current facility-administered medications on file prior to encounter.   Current Outpatient Medications on File Prior to Encounter  Medication Sig Dispense Refill   amLODipine (NORVASC) 10 MG tablet Take 10 mg by mouth  daily.     aspirin 81 MG EC tablet TAKE 1 TABLET BY MOUTH EVERY DAY 90 tablet 3   benazepril (LOTENSIN) 40 MG tablet Take 40 mg by mouth daily.      cholecalciferol (VITAMIN D3) 25 MCG (1000 UNIT) tablet Take 1,000 Units by mouth daily.     clopidogrel (PLAVIX) 75 MG tablet TAKE 1 TABLET BY MOUTH EVERY DAY 90 tablet 3   ELDERBERRY PO Take 1 tablet by mouth daily.     finasteride (PROSCAR) 5 MG tablet Take 5 mg by mouth daily.     glucose blood (PRECISION QID TEST) test strip 1 each by Other route as directed.     hydrochlorothiazide (HYDRODIURIL) 25 MG tablet TAKE 1 TABLET BY MOUTH EVERY DAY 90 tablet 3   insulin NPH Human (NOVOLIN N) 100 UNIT/ML injection Inject 15 Units into the skin 2 (two) times daily before a meal.     Insulin Pen Needle (B-D UF III MINI  PEN NEEDLES) 31G X 5 MM MISC Use to inject Ozempic once a week     isosorbide mononitrate (IMDUR) 60 MG 24 hr tablet Take 180 mg by mouth daily.     metFORMIN (GLUCOPHAGE) 1000 MG tablet Take 1,000 mg by mouth 2 (two) times daily with a meal.     metoprolol tartrate (LOPRESSOR) 50 MG tablet Take 1 tablet (50 mg total) by mouth 2 (two) times daily. 180 tablet 3   Multiple Vitamin (MULTIVITAMIN WITH MINERALS) TABS tablet Take 1 tablet by mouth daily.     nitroGLYCERIN (NITROSTAT) 0.4 MG SL tablet Dissolve 1 tablet under the tongue every 5 minutes as needed for chest pain. Max of 3 doses, then 911. 25 tablet 5   phenazopyridine (PYRIDIUM) 100 MG tablet Take 100 mg by mouth daily. Urinary urgency     ranolazine (RANEXA) 1000 MG SR tablet Take 1 tablet (1,000 mg total) by mouth 2 (two) times daily. 60 tablet 11   rosuvastatin (CRESTOR) 20 MG tablet TAKE 1 TABLET BY MOUTH EVERY DAY 90 tablet 3   Zinc Gluconate 100 MG TABS Take 1 tablet by mouth daily.      Labs:   Lab Results  Component Value Date   WBC 7.1 07/15/2022   HGB 13.1 07/15/2022   HCT 39.9 07/15/2022   MCV 90.3 07/15/2022   PLT 234 07/15/2022    Recent Labs  Lab  07/15/22 0955  NA 138  K 4.2  CL 107  CO2 21*  BUN 15  CREATININE 1.18  CALCIUM 9.5  GLUCOSE 211*   Lab Results  Component Value Date   CKTOTAL 141 07/22/2012   CKTOTAL 62 08/16/2008   CKTOTAL 64 08/15/2008   CKMB 3.3 07/22/2012   CKMB 1.9 08/16/2008   CKMB 1.9 08/15/2008   TROPONINI 0.05 (HH) 07/18/2016   TROPONINI 0.05 (HH) 07/17/2016   TROPONINI 0.04 (HH) 07/17/2016     Lab Results  Component Value Date   CHOL 109 01/14/2021   CHOL 129 04/27/2020   CHOL 174 01/23/2020   Lab Results  Component Value Date   HDL 44 01/14/2021   HDL 39 (L) 04/27/2020   HDL 41 01/23/2020   Lab Results  Component Value Date   LDLCALC 39 01/14/2021   LDLCALC 63 04/27/2020   LDLCALC 96 01/23/2020   Lab Results  Component Value Date   TRIG 131 01/14/2021   TRIG 156 (H) 04/27/2020   TRIG 214 (H) 01/23/2020   Lab Results  Component Value Date   CHOLHDL 2.5 01/14/2021   CHOLHDL 3.3 04/27/2020   CHOLHDL 4.2 01/23/2020   No results found for: "LDLDIRECT"     Radiology: DG Chest 2 View  Result Date: 07/15/2022 CLINICAL DATA:  Chest pain. EXAM: CHEST - 2 VIEW COMPARISON:  March 30, 2022. FINDINGS: The heart size and mediastinal contours are within normal limits. Status post coronary bypass graft. Both lungs are clear. The visualized skeletal structures are unremarkable. IMPRESSION: No active cardiopulmonary disease. Electronically Signed   By: Marijo Conception M.D.   On: 07/15/2022 10:16    EKG: SR LVH nonspecific ST changes   ASSESSMENT AND PLAN:   1. Angina:  discussed with Dr [name] who knows him well admit for 24  hour observation Split imdur upt to 90 mg bid. Increase lopressor Continue Ranexa DAT and statin. Not inclined to cath unless ECG changes, rest pain or bump in troponin No need for heparin 2. Aortic stenosis:  discussed with [facility] wether we should  enter him in moderate AS TAVR trial to see if relief of gradients helps his angina Can do outpatient TAVR CT and especially  if he measures to a large 29 mm Sapien 3 valve may be worth considering Can update TTE this month in office   Consider d/c over weekend with outpatient f/u [facility] Signed: Collier Salina Nishan9/29/2023, 4:12 PM

## 2022-07-15 NOTE — ED Triage Notes (Signed)
States he went to the bathroom this am after going to he layed back down in bed and started having chest pressure took 2 ntg with relief, then the pressure returned. States he is scheduled for stress test next week.

## 2022-07-15 NOTE — Telephone Encounter (Signed)
Patient called stating he is in the emergency room, he was advised by the ER to give [place] a call to let us know he is there.

## 2022-07-16 DIAGNOSIS — I200 Unstable angina: Secondary | ICD-10-CM | POA: Diagnosis not present

## 2022-07-16 DIAGNOSIS — Z951 Presence of aortocoronary bypass graft: Secondary | ICD-10-CM | POA: Diagnosis not present

## 2022-07-16 DIAGNOSIS — I25110 Atherosclerotic heart disease of native coronary artery with unstable angina pectoris: Secondary | ICD-10-CM | POA: Diagnosis not present

## 2022-07-16 DIAGNOSIS — R079 Chest pain, unspecified: Secondary | ICD-10-CM | POA: Diagnosis not present

## 2022-07-16 DIAGNOSIS — I350 Nonrheumatic aortic (valve) stenosis: Secondary | ICD-10-CM | POA: Diagnosis not present

## 2022-07-16 DIAGNOSIS — I10 Essential (primary) hypertension: Secondary | ICD-10-CM | POA: Diagnosis not present

## 2022-07-16 LAB — CBC
HCT: 35.6 % — ABNORMAL LOW (ref 39.0–52.0)
Hemoglobin: 12 g/dL — ABNORMAL LOW (ref 13.0–17.0)
MCH: 30 pg (ref 26.0–34.0)
MCHC: 33.7 g/dL (ref 30.0–36.0)
MCV: 89 fL (ref 80.0–100.0)
Platelets: 250 10*3/uL (ref 150–400)
RBC: 4 MIL/uL — ABNORMAL LOW (ref 4.22–5.81)
RDW: 13.1 % (ref 11.5–15.5)
WBC: 8.1 10*3/uL (ref 4.0–10.5)
nRBC: 0 % (ref 0.0–0.2)

## 2022-07-16 LAB — BASIC METABOLIC PANEL
Anion gap: 12 (ref 5–15)
BUN: 17 mg/dL (ref 8–23)
CO2: 24 mmol/L (ref 22–32)
Calcium: 9.1 mg/dL (ref 8.9–10.3)
Chloride: 104 mmol/L (ref 98–111)
Creatinine, Ser: 1.17 mg/dL (ref 0.61–1.24)
GFR, Estimated: >60 mL/min (ref >60)
Glucose, Bld: 173 mg/dL — ABNORMAL HIGH (ref 70–99)
Potassium: 3.9 mmol/L (ref 3.5–5.1)
Sodium: 140 mmol/L (ref 135–145)

## 2022-07-16 LAB — GLUCOSE, CAPILLARY
Glucose-Capillary: 267 mg/dL — ABNORMAL HIGH (ref 70–99)
Glucose-Capillary: 159 mg/dL — ABNORMAL HIGH (ref 70–99)
Glucose-Capillary: 212 mg/dL — ABNORMAL HIGH (ref 70–99)

## 2022-07-16 MED ORDER — PHENAZOPYRIDINE HCL 100 MG PO TABS: 100.0000 mg | ORAL_TABLET | Freq: Every day | ORAL | Status: DC

## 2022-07-16 MED ORDER — METOPROLOL TARTRATE 75 MG PO TABS: 75.0000 mg | ORAL_TABLET | Freq: Two times a day (BID) | ORAL | 2 refills | Status: DC

## 2022-07-16 MED ORDER — ISOSORBIDE MONONITRATE ER 60 MG PO TB24: 90.0000 mg | ORAL_TABLET | Freq: Two times a day (BID) | ORAL | 2 refills | Status: DC

## 2022-07-16 NOTE — Discharge Summary (Addendum)
Discharge Summary    Patient ID: [name] [name] MRN: [id number]; DOB: Mar 17, 1946  Admit date: 07/15/2022 Discharge date: 07/16/2022  PCP:  Marda Stalker, [facility] Cardiologist:  Sherren Mocha, MD   Discharge Diagnoses    Principal Problem:   Chest pain Active Problems:   Essential hypertension   CAD s/p CABG x5   Moderate aortic stenosis   Pure hypercholesterolemia   Type 2 diabetes mellitus with complication, with long-term current use of insulin ([place])   PAD (peripheral artery disease) ([place])    Diagnostic Studies/Procedures    None _____________   History of Present Illness     [name] [name] is a 76 y.o. male with a history of CAD s/p remote CABG x5 (LIMA to LAD, reverse SVG to Diag, sequential SVG to intermediate coronary artery and distal LCX, and reverse SVG to distal RCA) in 2009 with multiple interventions since that time (most recently balloon angioplasty to distal SVG to OM in 03/2022), moderate aortic stenosis, PAD s/p endarterectomy with patch angioplasty of right femoral artery in 12/2020, hypertension, hyperlipidemia, and type 2 diabetes mellitus who is followed by Dr. [name] and was admitted on 07/15/2022 for further evaluation of chest pain.  History of CAD as described above. He underwent CABG x5 in 2009 and has had multiple interventions since then.  He has chronic angina.  Last cath in 03/2022 showed patent LIMA to LAD and SVG to PAD with an 80% stenosis of the distal SVG to OM. He underwent successful PCI with balloon angioplasty to this lesion.  He was last seen by Dr. [name] in 05/2022 at which time he continued to report and chest pain when walking long distances like he has to do at work with associated dyspnea.  He reported premedicated with Nitroglycerin at times. He denied any resting chest pain and reported that he did not have any improvement in symptoms after his last PCI.  Patient presented to the ED on 07/15/2022 for  worsening angina over the last 2 weeks. He reported chest pain after only walking about 25-50 feet. He denied any resting pain. EKG showed no acute ischemic changes and high-sensitivity troponin negative x2. He was admitted for observation and adjustment of antianginals.  Hospital Course     Consultants: None   Patient was admitted on 07/15/2022 for observation and adjustment of antianginals after presenting with chest pain. He has a long history of CAD s/p CABG in 2009 and multiple interventions since then, most recently in 03/2022. He has chronic angina and reported no significant improvement in symptoms since PCI at recent office visit in 05/2022. He presented with worsening exertional chest pain after only walking about 25-50 feet. He denied any resting chest pain. EKG showed no acute ischemic changes and high-sensitivity troponin negative x2. His Imdur was increased to 2m twice daily and Lopressor was increased to 745mtwice daily. He was continued on home Amlodipine 1054maily and Ranexa. Of note, he had both 500m23md 1,000mg63mRanexa listed under his PTA medications but could not remember what dose he was taking. He was continued on 1,000mg 51me daily here. He did well with these changes and denies any chest pain today. Continue all other home medications including DAPT (Aspirin and Plavix), Benazepril 40mg d57m, HCTZ 25mg da86m and Crestor 20mg dai66m Patient was seen and examined by Dr. PembertonJohney Framed determined to be stable for discharge. Outpatient follow-up arranged. Medications as below.  Did the patient have  an acute coronary syndrome (MI, NSTEMI, STEMI, etc) this admission?:  No                               Did the patient have a percutaneous coronary intervention (stent / angioplasty)?:  No.    _____________  Discharge Vitals Blood pressure 125/64, pulse 67, temperature 97.6 F (36.4 C), temperature source Oral, resp. rate 14, height 5' 9"  (1.753 m), weight 93.7 kg, SpO2  95 %.  Filed Weights   07/15/22 0942 07/15/22 1831  Weight: 91.2 kg 93.7 kg    Labs & Radiologic Studies    CBC Recent Labs    07/15/22 0955 07/15/22 1845 07/16/22 0208  WBC 7.1 7.3 8.1  NEUTROABS 4.9  --   --   HGB 13.1 13.4 12.0*  HCT 39.9 39.9 35.6*  MCV 90.3 88.5 89.0  PLT 234 258 568   Basic Metabolic Panel Recent Labs    07/15/22 0955 07/15/22 1845 07/16/22 0208  NA 138  --  140  K 4.2  --  3.9  CL 107  --  104  CO2 21*  --  24  GLUCOSE 211*  --  173*  BUN 15  --  17  CREATININE 1.18 1.12 1.17  CALCIUM 9.5  --  9.1  MG 1.8  --   --    Liver Function Tests No results for input(s): "AST", "ALT", "ALKPHOS", "BILITOT", "PROT", "ALBUMIN" in the last 72 hours. No results for input(s): "LIPASE", "AMYLASE" in the last 72 hours. High Sensitivity Troponin:   Recent Labs  Lab 07/15/22 0955 07/15/22 1652  TROPONINIHS 10 13    BNP Invalid input(s): "POCBNP" D-Dimer No results for input(s): "DDIMER" in the last 72 hours. Hemoglobin A1C Recent Labs    07/15/22 1845  HGBA1C 7.1*   Fasting Lipid Panel No results for input(s): "CHOL", "HDL", "LDLCALC", "TRIG", "CHOLHDL", "LDLDIRECT" in the last 72 hours. Thyroid Function Tests No results for input(s): "TSH", "T4TOTAL", "T3FREE", "THYROIDAB" in the last 72 hours.  Invalid input(s): "FREET3" _____________  DG Chest 2 View  Result Date: 07/15/2022 CLINICAL DATA:  Chest pain. EXAM: CHEST - 2 VIEW COMPARISON:  March 30, 2022. FINDINGS: The heart size and mediastinal contours are within normal limits. Status post coronary bypass graft. Both lungs are clear. The visualized skeletal structures are unremarkable. IMPRESSION: No active cardiopulmonary disease. Electronically Signed   By: Marijo Conception M.D.   On: 07/15/2022 10:16   Disposition   Patient is being discharged home today in good condition.  Follow-up Plans & Appointments     Follow-up Information     Liliane Shi, PA-C Follow up.   Specialties:  Cardiology, Physician Assistant Why: Hospital follow-up with Cardiology scheduled for 07/27/2022 at 8:25am. Please arrive 15 minutes early for check-in. If this date/time does not work for you, please call our office to reschedule. Contact information: [address] [place] [address] [phone]                Discharge Instructions     Diet - low sodium heart healthy   Complete by: As directed    Increase activity slowly   Complete by: As directed        Discharge Medications   Allergies as of 07/16/2022       Reactions   Empagliflozin Other (See Comments)   Abdominal pain/dizziness        Medication List  TAKE these medications    amLODipine 10 MG tablet Commonly known as: NORVASC Take 10 mg by mouth daily.   aspirin EC 81 MG tablet TAKE 1 TABLET BY MOUTH EVERY DAY   B-D UF III MINI PEN NEEDLES 31G X 5 MM Misc Generic drug: Insulin Pen Needle Use to inject Ozempic once a week   benazepril 40 MG tablet Commonly known as: LOTENSIN Take 40 mg by mouth daily.   cholecalciferol 25 MCG (1000 UNIT) tablet Commonly known as: VITAMIN D3 Take 1,000 Units by mouth daily.   clopidogrel 75 MG tablet Commonly known as: PLAVIX TAKE 1 TABLET BY MOUTH EVERY DAY   ELDERBERRY PO Take 1 tablet by mouth daily.   finasteride 5 MG tablet Commonly known as: PROSCAR Take 5 mg by mouth daily.   hydrochlorothiazide 25 MG tablet Commonly known as: HYDRODIURIL TAKE 1 TABLET BY MOUTH EVERY DAY   insulin NPH Human 100 UNIT/ML injection Commonly known as: NOVOLIN N Inject 15 Units into the skin 2 (two) times daily before a meal.   isosorbide mononitrate 60 MG 24 hr tablet Commonly known as: IMDUR Take 1.5 tablets (90 mg total) by mouth 2 (two) times daily. What changed:  how much to take when to take this Another medication with the same name was removed. Continue taking this medication, and follow the directions you see here.    metFORMIN 1000 MG tablet Commonly known as: GLUCOPHAGE Take 1,000 mg by mouth 2 (two) times daily with a meal.   Metoprolol Tartrate 75 MG Tabs Take 75 mg by mouth 2 (two) times daily. What changed:  medication strength how much to take   multivitamin with minerals Tabs tablet Take 1 tablet by mouth daily.   nitroGLYCERIN 0.4 MG SL tablet Commonly known as: NITROSTAT Dissolve 1 tablet under the tongue every 5 minutes as needed for chest pain. Max of 3 doses, then 911.   phenazopyridine 100 MG tablet Commonly known as: Pyridium Take 1 tablet (100 mg total) by mouth daily. Urinary urgency   Precision QID Test test strip Generic drug: glucose blood 1 each by Other route as directed.   ranolazine 1000 MG SR tablet Commonly known as: Ranexa Take 1 tablet (1,000 mg total) by mouth 2 (two) times daily. What changed: Another medication with the same name was removed. Continue taking this medication, and follow the directions you see here.   rosuvastatin 20 MG tablet Commonly known as: CRESTOR TAKE 1 TABLET BY MOUTH EVERY DAY   Zinc Gluconate 100 MG Tabs Take 1 tablet by mouth daily.           Outstanding Labs/Studies   N/A  Duration of Discharge Encounter   Greater than 30 minutes including physician time.  Signed, Darreld Mclean, PA-C 07/16/2022, 1:21 PM  Patient seen and examined and agree with Sande Rives PA-C as detailed above.   In brief, the patient is a 76 y.o. male with a history of CAD s/p remote CABG x5 (LIMA to LAD, reverse SVG to Diag, sequential SVG to intermediate coronary artery and distal LCX, and reverse SVG to distal RCA) in 2009 with multiple interventions since that time (most recently balloon angioplasty to distal SVG to OM in 03/2022), moderate aortic stenosis, PAD s/p endarterectomy with patch angioplasty of right femoral artery in 12/2020, hypertension, hyperlipidemia, and type 2 diabetes mellitus who is followed by Dr. [name] and was  admitted on 07/15/2022 for further evaluation of chest pain.  Trop negative overnight. Chest pain resolved.  He feels well and would like to go home on increased dose of imdur. Will arrange for CV follow-up.  GEN: No acute distress.   Neck: No JVD Cardiac: RRR, 2/6 harsh systolic murmur Respiratory: Clear to auscultation bilaterally. GI: Soft, nontender, non-distended  MS: No edema; No deformity. Neuro:  Nonfocal  Psych: Normal affect    Plan: -Continue medical management of CAD given minimal improvement in chest pain since last PCI  -Continue imdur 71m BID -Continue metop 766mBID -Continue ranexa 100071mID -Continue ASA and plavix -Continue crestor 37m92mily -Will discuss possible enrollment in Progress trial for moderate AS as outpatient  HeatGwyndolyn Kaufman

## 2022-07-16 NOTE — Progress Notes (Signed)
Pt's HR in the 40's-50's bpm while asleep. Will continue to monitor pt.

## 2022-07-16 NOTE — Discharge Instructions (Signed)
Medication Changes: - INCREASE Imdur to 21m twice daily. - INCREASE Metoprolol tartrate to 798mtwice daily. - CONTINUE Ranexa 1,00074mwice daily. - CONTINUE Amlodipine 58m98mily. - Continue all other medications as directed elsewhere on discharge summary.

## 2022-07-16 NOTE — Care Management Obs Status (Signed)
[place] NOTIFICATION   Patient Details  Name: [name] [name] MRN: [id number] Date of Birth: November 06, 1945   Medicare Observation Status Notification Given:  Yes    Camary Sosa G., RN 07/16/2022, 9:33 AM

## 2022-07-17 LAB — LIPOPROTEIN A (LPA): Lipoprotein (a): 115.6 nmol/L — ABNORMAL HIGH (ref <75.0)

## 2022-07-18 DIAGNOSIS — E1159 Type 2 diabetes mellitus with other circulatory complications: Secondary | ICD-10-CM | POA: Diagnosis not present

## 2022-07-18 DIAGNOSIS — Z7984 Long term (current) use of oral hypoglycemic drugs: Secondary | ICD-10-CM | POA: Diagnosis not present

## 2022-07-18 DIAGNOSIS — Z794 Long term (current) use of insulin: Secondary | ICD-10-CM | POA: Diagnosis not present

## 2022-07-18 DIAGNOSIS — E1165 Type 2 diabetes mellitus with hyperglycemia: Secondary | ICD-10-CM | POA: Diagnosis not present

## 2022-07-18 DIAGNOSIS — Z951 Presence of aortocoronary bypass graft: Secondary | ICD-10-CM | POA: Diagnosis not present

## 2022-07-24 ENCOUNTER — Other Ambulatory Visit: Payer: Self-pay | Admitting: Cardiovascular Disease

## 2022-07-24 DIAGNOSIS — I350 Nonrheumatic aortic (valve) stenosis: Secondary | ICD-10-CM

## 2022-07-26 DIAGNOSIS — E119 Type 2 diabetes mellitus without complications: Secondary | ICD-10-CM | POA: Diagnosis not present

## 2022-07-26 DIAGNOSIS — E782 Mixed hyperlipidemia: Secondary | ICD-10-CM | POA: Diagnosis not present

## 2022-07-26 DIAGNOSIS — I10 Essential (primary) hypertension: Secondary | ICD-10-CM | POA: Diagnosis not present

## 2022-07-26 DIAGNOSIS — Z6829 Body mass index (BMI) 29.0-29.9, adult: Secondary | ICD-10-CM | POA: Diagnosis not present

## 2022-07-26 DIAGNOSIS — E663 Overweight: Secondary | ICD-10-CM | POA: Diagnosis not present

## 2022-07-27 ENCOUNTER — Ambulatory Visit: Payer: Medicare Other | Attending: Physician Assistant | Admitting: Nurse Practitioner

## 2022-07-27 ENCOUNTER — Encounter: Payer: Self-pay | Admitting: Nurse Practitioner

## 2022-07-27 VITALS — BP 120/78 | HR 60 | Ht 69.0 in | Wt 207.2 lb

## 2022-07-27 DIAGNOSIS — I739 Peripheral vascular disease, unspecified: Secondary | ICD-10-CM | POA: Insufficient documentation

## 2022-07-27 DIAGNOSIS — I200 Unstable angina: Secondary | ICD-10-CM

## 2022-07-27 DIAGNOSIS — I25119 Atherosclerotic heart disease of native coronary artery with unspecified angina pectoris: Secondary | ICD-10-CM | POA: Insufficient documentation

## 2022-07-27 DIAGNOSIS — I6523 Occlusion and stenosis of bilateral carotid arteries: Secondary | ICD-10-CM | POA: Insufficient documentation

## 2022-07-27 DIAGNOSIS — I359 Nonrheumatic aortic valve disorder, unspecified: Secondary | ICD-10-CM | POA: Diagnosis not present

## 2022-07-27 DIAGNOSIS — I10 Essential (primary) hypertension: Secondary | ICD-10-CM | POA: Diagnosis not present

## 2022-07-27 DIAGNOSIS — E785 Hyperlipidemia, unspecified: Secondary | ICD-10-CM | POA: Insufficient documentation

## 2022-07-27 DIAGNOSIS — I350 Nonrheumatic aortic (valve) stenosis: Secondary | ICD-10-CM | POA: Diagnosis not present

## 2022-07-27 LAB — LIPID PANEL
Chol/HDL Ratio: 4.1 ratio (ref 0.0–5.0)
Cholesterol, Total: 160 mg/dL (ref 100–199)
HDL: 39 mg/dL — ABNORMAL LOW (ref >39)
LDL Chol Calc (NIH): 72 mg/dL (ref 0–99)
Triglycerides: 305 mg/dL — ABNORMAL HIGH (ref 0–149)
VLDL Cholesterol Cal: 49 mg/dL — ABNORMAL HIGH (ref 5–40)

## 2022-07-27 NOTE — Progress Notes (Signed)
Cardiology Office Note:    Date:  07/27/2022   ID:  [name] [name], DOB [birth year], MRN [id number]  PCP:  Marda Stalker, [place] Providers Cardiologist:  Sherren Mocha, MD     Referring MD: Marda Stalker, PA-C   CC: Here for ED follow-up and exertional chest pain  History of Present Illness:    [name] [name] is a 76 y.o. male with a hx of the following:  Hypertension CAD, s/p CABG x 5 in 2009, history of PCI procedures Atherosclerosis with intermittent claudication PAD Moderate AS Carotid stenosis History of NSTEMI Type 2 diabetes Hyperlipidemia Murmur   Underwent multiple PCI procedures over the past few years since undergoing CABG.  Underwent right common femoral endarterectomy in 2002 to treat severe CFA stenosis and right leg claudication.  Underwent PCI in November 2022 in June 2023 with treatment of SVG to obtuse marginal for treatment of anginal pain, successful PCI with balloon angioplasty to this lesion.  Last seen by Dr. [name] on May 25, 2022.  Stated he continued to have anginal discomfort when walking long distances, premedicate with nitroglycerin at times.  Associated symptoms included dyspnea with exertion.  Stated after his last PCI procedure he did not have any improvement in his symptoms.  TTE in May 2023 showed EF 65 to 70%.  Grade 1 DD.  Dr. [name] at that time decided to continue current medical management, as he did not think further PCI procedures would benefit him as he was known to have diffuse distal disease of his vessels.  We will continue surveillance program to include clinical follow-up and serial echocardiogram studies for his history of moderate AS.   Because his chest pain did not go away since last cardiac cath, he presented to [facility] ED on July 15, 2022 for evaluation of chest pain, symptoms were becoming more prevalent and occurring with minimal task.  EKG revealed sinus rhythm with occasional PVCs,  nonspecific ST abnormality/ST depressions similar to EKG in June.  Troponins negative.  2 view chest x-ray negative for anything acute.  Dr. Johnsie Cancel consulted who spoke with Dr. [name], admitted him for observation.  Decided to split Imdur to 90 mg twice daily.  Increase Lopressor.  Stated there is no inclination to cath unless EKG changes, rest angina, or bump in troponins.  No need for heparin.  Stated for his moderate AS, whether patient should be entered into TAVR trial to see if relief of gradients helped his angina, stated to consider outpatient TAVR CT and especially if he measured to a large 29 mm SAPIEN 3 valve, he stated this may be worth considering.  Recommended to update TTE this month in office.  Was evaluated by Dr. Johney Frame at discharge, was stable and discharged home.  Today he presents for follow-up from recent ED visit.  He states he is still having similar symptoms, although central chest pain is not quite as bad, described as a dull, and non-radiating.  Says this occurs when he starts walking real fast and will sometimes take a 1 tablet of sublingual nitroglycerin prior to walking that will help.  He also knows to sit down soon after this starts, and within 5 minutes it is starting to ease up.  Rates it as a 5 or 6 on the 0-10 pain scale.  Does have some shortness of breath along with this, but taking deep breath helps relieve this.  States this has been going on for over a year.  Blood  pressure stable at home, denies any dizziness, palpitations, syncope, presyncope, lightheadedness, orthopnea, PND, swelling, bleeding, or claudication.   Past Medical History:  Diagnosis Date   Aortic stenosis    Arrhythmia    Premature beats   Coronary atherosclerosis of artery bypass graft    CABG 2009, LHC 07/2016 restenosis of SVG to OM treated with DES   Diabetes mellitus ([place])    Essential hypertension, benign    Fatty liver    History of kidney stones    PAD (peripheral artery disease) ([place])     Pure hypercholesterolemia     Past Surgical History:  Procedure Laterality Date   ABDOMINAL AORTOGRAM W/LOWER EXTREMITY N/A 12/30/2020   Procedure: ABDOMINAL AORTOGRAM W/LOWER EXTREMITY;  Surgeon: Wellington Hampshire, MD;  Location: [place] [facility];  Service: Cardiovascular;  Laterality: N/A;   ADENOIDECTOMY     CARDIAC CATHETERIZATION N/A 07/18/2016   Procedure: Left Heart Cath and Cors/Grafts Angiography;  Surgeon: Burnell Blanks, MD;  Location: [place] [facility];  Service: Cardiovascular;  Laterality: N/A;   CARDIAC CATHETERIZATION N/A 07/18/2016   Procedure: Coronary Stent Intervention;  Surgeon: Burnell Blanks, MD;  Location: [place] [facility];  Service: Cardiovascular;  Laterality: N/A;   CHOLECYSTECTOMY     CORONARY ANGIOPLASTY WITH STENT PLACEMENT     CORONARY ARTERY BYPASS GRAFT     CORONARY BALLOON ANGIOPLASTY N/A 04/01/2022   Procedure: CORONARY BALLOON ANGIOPLASTY;  Surgeon: Jettie Booze, MD;  Location: [place] [facility];  Service: Cardiovascular;  Laterality: N/A;  SVG-OM1   CORONARY BALLOON ANGIOPLASTY N/A 09/10/2021   Procedure: CORONARY BALLOON ANGIOPLASTY;  Surgeon: Sherren Mocha, MD;  Location: [place] [facility];  Service: Cardiovascular;  Laterality: N/A;   CORONARY STENT INTERVENTION N/A 01/11/2019   Procedure: CORONARY STENT INTERVENTION;  Surgeon: Sherren Mocha, MD;  Location: [place] [facility];  Service: Cardiovascular;  Laterality: N/A;   ENDARTERECTOMY FEMORAL Right 01/13/2021   Procedure: RIGHT FEMORAL ENDARTERECTOMY  WITH PATCH ANGIOPLASTY;  Surgeon: Rosetta Posner, MD;  Location: [place];  Service: Vascular;  Laterality: Right;   INGUINAL HERNIA REPAIR Right    LEFT HEART CATH AND CORS/GRAFTS ANGIOGRAPHY N/A 01/11/2019   Procedure: LEFT HEART CATH AND CORS/GRAFTS ANGIOGRAPHY;  Surgeon: Sherren Mocha, MD;  Location: [place] [facility];  Service: Cardiovascular;  Laterality: N/A;   LEFT HEART CATH AND CORS/GRAFTS ANGIOGRAPHY N/A  04/01/2022   Procedure: LEFT HEART CATH AND CORS/GRAFTS ANGIOGRAPHY;  Surgeon: Jettie Booze, MD;  Location: [place] [facility];  Service: Cardiovascular;  Laterality: N/A;   LEFT HEART CATH AND CORS/GRAFTS ANGIOGRAPHY N/A 09/10/2021   Procedure: LEFT HEART CATH AND CORS/GRAFTS ANGIOGRAPHY;  Surgeon: Sherren Mocha, MD;  Location: [place] [facility];  Service: Cardiovascular;  Laterality: N/A;   Right femoral endarterectomy and Dacron patch angioplasty  01/13/3021   TONSILLECTOMY      Current Medications: Current Meds  Medication Sig   amLODipine (NORVASC) 10 MG tablet Take 10 mg by mouth daily.   aspirin 81 MG EC tablet TAKE 1 TABLET BY MOUTH EVERY DAY   benazepril (LOTENSIN) 40 MG tablet Take 40 mg by mouth daily.    cholecalciferol (VITAMIN D3) 25 MCG (1000 UNIT) tablet Take 1,000 Units by mouth daily.   clopidogrel (PLAVIX) 75 MG tablet TAKE 1 TABLET BY MOUTH EVERY DAY   ELDERBERRY PO Take 1 tablet by mouth daily.   finasteride (PROSCAR) 5 MG tablet Take 5 mg by mouth daily.   glucose blood (PRECISION QID  TEST) test strip 1 each by Other route as directed.   hydrochlorothiazide (HYDRODIURIL) 25 MG tablet TAKE 1 TABLET BY MOUTH EVERY DAY   insulin NPH Human (NOVOLIN N) 100 UNIT/ML injection Inject 15 Units into the skin 2 (two) times daily before a meal.   Insulin Pen Needle (B-D UF III MINI PEN NEEDLES) 31G X 5 MM MISC Use to inject Ozempic once a week   isosorbide mononitrate (IMDUR) 60 MG 24 hr tablet Take 1.5 tablets (90 mg total) by mouth 2 (two) times daily.   metFORMIN (GLUCOPHAGE) 1000 MG tablet Take 1,000 mg by mouth 2 (two) times daily with a meal.   metoprolol tartrate 75 MG TABS Take 75 mg by mouth 2 (two) times daily.   Multiple Vitamin (MULTIVITAMIN WITH MINERALS) TABS tablet Take 1 tablet by mouth daily.   nitroGLYCERIN (NITROSTAT) 0.4 MG SL tablet Dissolve 1 tablet under the tongue every 5 minutes as needed for chest pain. Max of 3 doses, then 911.    phenazopyridine (PYRIDIUM) 100 MG tablet Take 1 tablet (100 mg total) by mouth daily. Urinary urgency   ranolazine (RANEXA) 1000 MG SR tablet Take 1 tablet (1,000 mg total) by mouth 2 (two) times daily.   rosuvastatin (CRESTOR) 20 MG tablet TAKE 1 TABLET BY MOUTH EVERY DAY   Zinc Gluconate 100 MG TABS Take 1 tablet by mouth daily.     Allergies:   Empagliflozin   Social History   Socioeconomic History   Marital status: Married    Spouse name: Not on file   Number of children: Not on file   Years of education: Not on file   Highest education level: Not on file  Occupational History   Not on file  Tobacco Use   Smoking status: Never   Smokeless tobacco: Never  Vaping Use   Vaping Use: Never used  Substance and Sexual Activity   Alcohol use: No   Drug use: No   Sexual activity: Not on file  Other Topics Concern   Not on file  Social History Narrative   Not on file   Social Determinants of Health   Financial Resource Strain: Not on file  Food Insecurity: No Food Insecurity (07/16/2022)   Hunger Vital Sign    Worried About Running Out of Food in the Last Year: Never true    Ran Out of Food in the Last Year: Never true  Transportation Needs: Not on file  Physical Activity: Not on file  Stress: Not on file  Social Connections: Not on file     Family History: The patient's family history includes Heart disease in his mother. There is no history of Diabetes.  ROS:   Review of Systems  Constitutional: Negative.   HENT: Negative.    Eyes: Negative.   Respiratory:  Positive for shortness of breath. Negative for cough, hemoptysis, sputum production and wheezing.        See HPI.  Cardiovascular:  Positive for chest pain. Negative for palpitations, orthopnea, claudication, leg swelling and PND.       See HPI.  Gastrointestinal: Negative.   Genitourinary: Negative.   Musculoskeletal: Negative.   Skin: Negative.   Neurological: Negative.   Endo/Heme/Allergies: Negative.    Psychiatric/Behavioral: Negative.      Please see the history of present illness.    All other systems reviewed and are negative.  EKGs/Labs/Other Studies Reviewed:    The following studies were reviewed today:   EKG:  EKG is not ordered.  Left heart cath and coronary angiography on April 01, 2022:   Ost LAD to Prox LAD lesion is 100% stenosed.  LIMA to LAD is patent.   Dist LAD lesion is 70% stenosed.  Diffuse disease noted in the native LAD.   Prox RCA to Mid RCA lesion is 100% stenosed.  SVG to PDA is patent.   Dist RCA lesion is 40% stenosed.   Ost RPDA to RPDA lesion is 40% stenosed.   Ost Cx to Prox Cx lesion is 99% stenosed.   SVG to OM is patent.  There is a Dist Graft to Insertion lesion is 80% stenosed.   Scoring balloon angioplasty was performed using a 1.75 mm score flex, followed by a BALL SAPPHIRE NC24 2.50X10.   Post intervention, there is a 10% residual stenosis.   There is moderate aortic valve stenosis.  Mean gradient 37 mmHg   Continue aggressive secondary prevention.  Dual antiplatelet therapy going forward.   Area of disease was distal in the graft and there is difficulty in delivering equipment as well.  I do not think an additional stent would be beneficial.  Could consider drug-eluting balloon if available in the event that he has further restenosis.   TTE on Feb 17, 2022: 1. Left ventricular ejection fraction, by estimation, is 65 to 70%. Left  ventricular ejection fraction by 3D volume is 68 %. The left ventricle has  normal function. The left ventricle has no regional wall motion  abnormalities. Left ventricular diastolic   parameters are consistent with Grade I diastolic dysfunction (impaired  relaxation).   2. Right ventricular systolic function is normal. The right ventricular  size is normal. Tricuspid regurgitation signal is inadequate for assessing  PA pressure.   3. The mitral valve is degenerative. No evidence of mitral valve   regurgitation. No evidence of mitral stenosis. Moderate mitral annular  calcification.   4. The aortic valve is calcified. There is severe calcifcation of the  aortic valve. There is severe thickening of the aortic valve. Aortic valve  regurgitation is trivial. Moderate aortic valve stenosis. Aortic valve  area, by VTI measures 1.56 cm.  Aortic valve mean gradient measures 24.2 mmHg. Aortic valve Vmax measures  3.29 m/s.   5. Aortic dilatation noted. There is borderline dilatation of the  ascending aorta, measuring 37 mm.   6. The inferior vena cava is normal in size with greater than 50%  respiratory variability, suggesting right atrial pressure of 3 mmHg.   7. Compared to study dated 08/09/2021, the mean AVG has increased from 20  to 36mhg, AV VMax has increased from 3.03 to 3.234m and DVI has decresed  from 0.34 to 0.29.    Left heart cath on September 10, 2021:     Ost LAD to Prox LAD lesion is 100% stenosed.   Dist LAD lesion is 70% stenosed.   Ost Cx to Prox Cx lesion is 99% stenosed.   Dist RCA lesion is 40% stenosed.   Prox RCA to Mid RCA lesion is 100% stenosed.   Ost RPDA to RPDA lesion is 40% stenosed.   Dist Graft to Insertion lesion is 75% stenosed.   Balloon angioplasty was performed using a BALLN [place] EUHub[place]stonX 2.5X15.   Post intervention, there is a 20% residual stenosis.   SVG and is normal in caliber.   SVG and is normal in caliber.   SVG and is normal in caliber.   LIMA and is normal in caliber.   There is moderate  aortic valve stenosis.   There is no mitral valve stenosis.   1.  Severe native three-vessel coronary artery disease with total occlusion of the RCA and total occlusion of the left main. 2.  Status post aortocoronary bypass surgery with continued patency of the LIMA to LAD, saphenous vein graft to distal RCA, saphenous vein graft to diagonal, and saphenous vein graft to OM.  There is mild in-stent restenosis in the native PDA ostium estimated at  40%.  There is severe in-stent restenosis at the stented segment of the SVG to OM anastomosis, estimated at 75%. 3.  Successful balloon angioplasty of severe in-stent restenosis involving the SVG to obtuse marginal distal anastomosis, reducing 75% stenosis to 20%. 4.  Moderate aortic stenosis with mean gradient 33 mmHg   Recommend: Same-day PCI protocol as long as no early complications arise.  Ongoing medical therapy and surveillance of aortic stenosis.  If persistent lifestyle limiting symptoms, will need to consider redo CABG/aortic valve replacement versus TAVR and ongoing medical therapy.  If significant improvement in symptoms, we will continue with our current management of medical therapy and surveillance of aortic stenosis.   Bilateral carotid duplex on August 31, 2021: Left Carotid: Velocities in the left ICA are consistent with a 1-39%  stenosis.   Vertebrals: Bilateral vertebral arteries demonstrate antegrade flow.  Subclavians: Normal flow hemodynamics were seen in bilateral subclavian  arteries.    Abdominal aortogram w/ lower extremity on 12/30/2020: 1.  No significant aortoiliac disease. 2.  Right lower extremity: Severe heavily calcified disease affecting the common femoral artery into the ostium of the profunda, severe TP trunk stenosis with collaterals as well as severe proximal anterior tibial artery disease with collaterals. 3.  Moderate left common femoral artery disease.  A full runoff on the left was not performed given lack of symptoms.   Recommendations: Recommend right common femoral artery endarterectomy.  I consulted Dr. Donnetta Hutching. Tibial disease does not need to be treated at the present time given that the patient's main symptoms are claudication and not critical limb ischemia.  Lower extremity arterial ultrasound on 09/14/2020: Right: 75-99% stenosis noted in the common femoral artery. Moderate  atherosclerosis noted throughout aorta and iliac arteries.  Heterogenous  irregular calcified plaque seen in the CFA portion of artery.   Left: 30-49% stenosis noted in the common femoral artery. Moderate  atherosclerosis noted throughout aorta and iliac arteries. There is a  large piece of plaque protruding from the posterior wall into the lumen of  the distal portion of CFA.   ABI's on 09/14/20: Right ABI: Indicated mild right lower extremity arterial disease, right toe brachial index was normal.  Left ABI: Left ABI was in normal range without evidence of significant left lower extremity arterial disease, left toe brachial index is abnormal.  Recent Labs: 03/30/2022: ALT 24 07/15/2022: Magnesium 1.8 07/16/2022: BUN 17; Creatinine, Ser 1.17; Hemoglobin 12.0; Platelets 250; Potassium 3.9; Sodium 140  Recent Lipid Panel    Component Value Date/Time   CHOL 109 01/14/2021 0105   CHOL 129 04/27/2020 0824   TRIG 131 01/14/2021 0105   HDL 44 01/14/2021 0105   HDL 39 (L) 04/27/2020 0824   CHOLHDL 2.5 01/14/2021 0105   VLDL 26 01/14/2021 0105   LDLCALC 39 01/14/2021 0105   LDLCALC 63 04/27/2020 0824    Physical Exam:    VS:  BP 120/78   Pulse 60   Ht 5' 9"  (1.753 m)   Wt 207 lb 3.2 oz (94 kg)  SpO2 97%   BMI 30.60 kg/m     Wt Readings from Last 3 Encounters:  07/27/22 207 lb 3.2 oz (94 kg)  07/15/22 206 lb 8 oz (93.7 kg)  05/25/22 204 lb (92.5 kg)     GEN: Well nourished, well developed in no acute distress HEENT: Normal NECK: No JVD; Right carotid bruit noted louder on ascultation vs. Left carotid bruit (minimal on ascultation).  CARDIAC: S1/S2, RRR, Grade 2/6 systolic murmur loudest along RSB, no rubs or gallops noted; 2+ peripheral pulses throughout, strong and equal bilaterally RESPIRATORY:  Clear and diminished to auscultation without rales, wheezing or rhonchi  ABDOMEN: Soft, non-tender, non-distended, bowel sounds x 4 MUSCULOSKELETAL:  No edema; No deformity  SKIN: Warm and dry NEUROLOGIC:  Alert and oriented x  3 PSYCHIATRIC:  Normal affect   ASSESSMENT:    1. Coronary artery disease involving native coronary artery of native heart with angina pectoris ([place])   2. Hyperlipidemia, unspecified hyperlipidemia type   3. Bilateral carotid artery stenosis   4. PAD (peripheral artery disease) ([place])   5. Moderate aortic stenosis   6. Hypertension, unspecified type    PLAN:    In order of problems listed above:  CAD, s/p CABG x 5 in 2009, history of PCI procedures Previous hospital stay did not warrant catheterization per Dr. [name] and Dr. Kyla Balzarine recommendation. He continues to have chronic anginal symptoms with exertion, despite balloon angioplasty in June.  Continues to take NG before walking.  Upon further review of his medications, discovered he is only taking Imdur 60 mg twice daily, and discussed Dr. Antionette Char recommendation to take Imdur 90 mg twice daily. He verbalizes understanding.  Moderate AS may also be contributing to this.  Continue current medication regimen.  ED precautions discussed. Heart healthy diet and regular cardiovascular exercise encouraged.    2. HLD Previous LDL 1 year ago, 39.  Total cholesterol 109.  He is due for repeat blood work and is fasting today.  We will obtain FLP. Heart healthy diet and regular cardiovascular exercise encouraged.   3. Bilateral carotid artery stenosis Carotid duplex on November 2022 revealed 1 to 39% stenosis in bilateral ICA. Bilateral vertebral arteries demonstrated antegrade flow with normal flow hemodynamics seen along bilateral subclavian arteries.  Right carotid bruit on exam more pronounced than left carotid bruit.  Currently asymptomatic.  Update carotid duplex for November.  Continue aspirin and Crestor. Heart healthy diet and regular cardiovascular exercise encouraged.   4. PAD Had a endarterectomy with patch angioplasty of right femoral artery in 12/2020.  Denies any claudication symptoms.  Continue aspirin and Crestor.  Will obtain FLP as  mentioned above. Heart healthy diet and regular cardiovascular exercise encouraged.   5. Moderate AS Last 2D echo shows slight progression of aortic stenosis, remained in moderate range.  Cardiology recommended follow-up this month.  We will update TTE at this time.  We will route note to Dr. [name] regarding TAVR CT that was discussed when he was in hospital.   6. HTN BP today, 120/78.  BP well controlled at home.  Continue amlodipine, benazepril, hydrochlorothiazide, and metoprolol tartrate. Heart healthy diet and regular cardiovascular exercise encouraged.   7. Disposition: Follow up with Dr. [name] in 1 month or sooner if anything changes.     Medication Adjustments/Labs and Tests Ordered: Current medicines are reviewed at length with the patient today.  Concerns regarding medicines are outlined above.  Orders Placed This Encounter  Procedures   Lipid panel   ECHOCARDIOGRAM  COMPLETE   VAS US CAROTID   No orders of the defined types were placed in this encounter.   Patient Instructions  Medication Instructions:  Your physician recommends that you continue on your current medications as directed. Please refer to the Current Medication list given to you today.  Remember to take the Isosorbide 1 1/2 tablet twice a day   *If you need a refill on your cardiac medications before your next appointment, please call your pharmacy*   Lab Work: TODAY: LIPID  If you have labs (blood work) drawn today and your tests are completely normal, you will receive your results only by: [place] (if you have MyChart) OR A paper copy in the mail If you have any lab test that is abnormal or we need to change your treatment, we will call you to review the results.   Testing/Procedures: Your physician has requested that you have an echocardiogram 1ST AVAILABLE. Echocardiography is a painless test that uses sound waves to create images of your heart. It provides your doctor with information  about the size and shape of your heart and how well your heart's chambers and valves are working. This procedure takes approximately one hour. There are no restrictions for this procedure.  Your physician has requested that you have a carotid duplex IN NOVEMBER. This test is an ultrasound of the carotid arteries in your neck. It looks at blood flow through these arteries that supply the brain with blood. Allow one hour for this exam. There are no restrictions or special instructions.    Follow-Up: At [facility], you and your health needs are our priority.  As part of our continuing mission to provide you with exceptional heart care, we have created designated Provider Care Teams.  These Care Teams include your primary Cardiologist (physician) and Advanced Practice Providers (APPs -  Physician Assistants and Nurse Practitioners) who all work together to provide you with the care you need, when you need it.  We recommend signing up for the patient portal called "MyChart".  Sign up information is provided on this After Visit Summary.  MyChart is used to connect with patients for Virtual Visits (Telemedicine).  Patients are able to view lab/test results, encounter notes, upcoming appointments, etc.  Non-urgent messages can be sent to your provider as well.   To learn more about what you can do with MyChart, go to [email or web address].    Your next appointment:   2 week(s)  The format for your next appointment:   In Person  Provider:   Sherren Mocha, MD  or Richardson Dopp, PA-C         Other Instructions   Important Information About Sugar         Signed, Finis Bud, NP  07/27/2022 9:20 AM    [place]

## 2022-07-27 NOTE — Patient Instructions (Signed)
Medication Instructions:  Your physician recommends that you continue on your current medications as directed. Please refer to the Current Medication list given to you today.  Remember to take the Isosorbide 1 1/2 tablet twice a day   *If you need a refill on your cardiac medications before your next appointment, please call your pharmacy*   Lab Work: TODAY: LIPID  If you have labs (blood work) drawn today and your tests are completely normal, you will receive your results only by: [place] (if you have MyChart) OR A paper copy in the mail If you have any lab test that is abnormal or we need to change your treatment, we will call you to review the results.   Testing/Procedures: Your physician has requested that you have an echocardiogram 1ST AVAILABLE. Echocardiography is a painless test that uses sound waves to create images of your heart. It provides your doctor with information about the size and shape of your heart and how well your heart's chambers and valves are working. This procedure takes approximately one hour. There are no restrictions for this procedure.  Your physician has requested that you have a carotid duplex IN NOVEMBER. This test is an ultrasound of the carotid arteries in your neck. It looks at blood flow through these arteries that supply the brain with blood. Allow one hour for this exam. There are no restrictions or special instructions.    Follow-Up: At [facility], you and your health needs are our priority.  As part of our continuing mission to provide you with exceptional heart care, we have created designated Provider Care Teams.  These Care Teams include your primary Cardiologist (physician) and Advanced Practice Providers (APPs -  Physician Assistants and Nurse Practitioners) who all work together to provide you with the care you need, when you need it.  We recommend signing up for the patient portal called "MyChart".  Sign up information is  provided on this After Visit Summary.  MyChart is used to connect with patients for Virtual Visits (Telemedicine).  Patients are able to view lab/test results, encounter notes, upcoming appointments, etc.  Non-urgent messages can be sent to your provider as well.   To learn more about what you can do with MyChart, go to [email or web address].    Your next appointment:   2 week(s)  The format for your next appointment:   In Person  Provider:   Sherren Mocha, MD  or Richardson Dopp, PA-C         Other Instructions   Important Information About Sugar

## 2022-07-28 NOTE — Progress Notes (Signed)
Thx for reaching out. I think we should order the TAVR CT's - he might be a candidate for our moderate risk tral. I will ask our team to place orders. Can you guys arrange follow-up with me after the CT studies? thx

## 2022-07-29 ENCOUNTER — Other Ambulatory Visit: Payer: Self-pay | Admitting: Cardiovascular Disease

## 2022-07-29 DIAGNOSIS — I350 Nonrheumatic aortic (valve) stenosis: Secondary | ICD-10-CM

## 2022-08-08 ENCOUNTER — Ambulatory Visit (HOSPITAL_COMMUNITY): Payer: Medicare Other | Attending: Nurse Practitioner

## 2022-08-08 DIAGNOSIS — I6523 Occlusion and stenosis of bilateral carotid arteries: Secondary | ICD-10-CM | POA: Diagnosis not present

## 2022-08-08 DIAGNOSIS — I25119 Atherosclerotic heart disease of native coronary artery with unspecified angina pectoris: Secondary | ICD-10-CM

## 2022-08-08 LAB — ECHOCARDIOGRAM COMPLETE
AR max vel: 0.87 cm2
AV Area VTI: 0.83 cm2
AV Area mean vel: 0.84 cm2
AV Mean grad: 44 mmHg
AV Peak grad: 75 mmHg
Ao pk vel: 4.33 m/s
Area-P 1/2: 2.33 cm2
P 1/2 time: 590 msec
S' Lateral: 3.8 cm

## 2022-08-08 MED ORDER — PERFLUTREN LIPID MICROSPHERE
1.0000 mL | INTRAVENOUS | Status: AC | PRN
  Administered 2022-08-08: 2 mL via INTRAVENOUS

## 2022-08-10 ENCOUNTER — Ambulatory Visit: Payer: Medicare Other | Admitting: Physician Assistant

## 2022-08-19 ENCOUNTER — Ambulatory Visit: Payer: Medicare Other | Attending: Cardiovascular Disease | Admitting: Cardiovascular Disease

## 2022-08-19 ENCOUNTER — Encounter: Payer: Self-pay | Admitting: Cardiovascular Disease

## 2022-08-19 VITALS — BP 126/70 | HR 52 | Ht 69.0 in | Wt 211.2 lb

## 2022-08-19 DIAGNOSIS — I350 Nonrheumatic aortic (valve) stenosis: Secondary | ICD-10-CM | POA: Insufficient documentation

## 2022-08-19 DIAGNOSIS — I359 Nonrheumatic aortic valve disorder, unspecified: Secondary | ICD-10-CM | POA: Insufficient documentation

## 2022-08-19 DIAGNOSIS — Z01810 Encounter for preprocedural cardiovascular examination: Secondary | ICD-10-CM | POA: Diagnosis not present

## 2022-08-19 DIAGNOSIS — I200 Unstable angina: Secondary | ICD-10-CM | POA: Diagnosis not present

## 2022-08-19 DIAGNOSIS — I25119 Atherosclerotic heart disease of native coronary artery with unspecified angina pectoris: Secondary | ICD-10-CM | POA: Diagnosis not present

## 2022-08-19 LAB — BASIC METABOLIC PANEL
BUN/Creatinine Ratio: 13 (ref 10–24)
BUN: 15 mg/dL (ref 8–27)
CO2: 23 mmol/L (ref 20–29)
Calcium: 9.5 mg/dL (ref 8.6–10.2)
Chloride: 101 mmol/L (ref 96–106)
Creatinine, Ser: 1.15 mg/dL (ref 0.76–1.27)
Glucose: 146 mg/dL — ABNORMAL HIGH (ref 70–99)
Potassium: 4.4 mmol/L (ref 3.5–5.2)
Sodium: 139 mmol/L (ref 134–144)
eGFR: 65 mL/min/{1.73_m2} (ref >59)

## 2022-08-19 NOTE — Patient Instructions (Signed)
Medication Instructions:  Your physician recommends that you continue on your current medications as directed. Please refer to the Current Medication list given to you today. *If you need a refill on your cardiac medications before your next appointment, please call your pharmacy*  Lab Work: BMP today If you have labs (blood work) drawn today and your tests are completely normal, you will receive your results only by: [place] (if you have MyChart) OR A paper copy in the mail If you have any lab test that is abnormal or we need to change your treatment, we will call you to review the results.  Testing/Procedures: TAVR CT's Your physician has requested that you have cardiac CT. Cardiac computed tomography (CT) is a painless test that uses an x-ray machine to take clear, detailed pictures of your heart. For further information please visit HugeFiesta.tn. Please follow instruction sheet as given.  Follow-Up: At [facility], you and your health needs are our priority.  As part of our continuing mission to provide you with exceptional heart care, we have created designated Provider Care Teams.  These Care Teams include your primary Cardiologist (physician) and Advanced Practice Providers (APPs -  Physician Assistants and Nurse Practitioners) who all work together to provide you with the care you need, when you need it.  Your next appointment:   Structural Team will follow-up  The format for your next appointment:   In Person  Provider:   Sherren Mocha, MD      Important Information About Sugar

## 2022-08-19 NOTE — Progress Notes (Addendum)
Pre Surgical Assessment: 5 M Walk Test  91M=16.22f  5 Meter Walk Test- trial 1: 4.46 seconds 5 Meter Walk Test- trial 2: 4.98 seconds 5 Meter Walk Test- trial 3: 4.45 seconds 5 Meter Walk Test Average: 4.63 seconds   ____________________  STS score Procedure Type: Isolated AVR Perioperative Outcome Estimate % Operative Mortality 3.9% Morbidity & Mortality 11.7% Stroke 2.82% Renal Failure 2.76% Reoperation 3.03% Prolonged Ventilation 8.38% Deep Sternal Wound Infection 0.417% [facility] (>14 days) 8.44% Short Hospital Stay (<6 days)* 31.7%

## 2022-08-19 NOTE — Progress Notes (Signed)
Cardiology Office Note:    Date:  08/19/2022   ID:  [name] [name], DOB 1946-09-26, MRN [id number]  PCP:  Marda Stalker, [place] [facility] Cardiologist:  Sherren Mocha, MD     Referring MD: Marda Stalker, PA-C   Chief Complaint  Patient presents with   Coronary Artery Disease   Chest Pain    History of Present Illness:    [name] [name] is a 76 y.o. male with a hx of  coronary and peripheral arterial disease, aortic stenosis, presenting for follow-up evaluation today.  He has undergone CABG and PCI procedures after bypass surgery.  He has had multiple PCI procedures over the past few years.  The patient underwent right common femoral endarterectomy in 2022 to treat severe common femoral artery stenosis and right leg claudication.  He underwent PCI in November 2022 and June 2023 with treatment of the saphenous vein graft to obtuse marginal for treatment of progressive anginal chest pain.  The patient has been followed for moderate aortic stenosis.  He recently underwent a repeat echocardiogram demonstrating progression to severe aortic stenosis and he presents today for further discussion of treatment options.  The patient is here alone today.  He continues to have exertional angina with any moderate level activity.  Sometimes he premedicate with sublingual nitroglycerin.  He has substernal chest pressure resolves within a few minutes of rest.  He has shortness of breath associated with this and states that his exertional dyspnea has worsened over the past 6 months.  He does not have orthopnea, PND, lightheadedness, heart palpitations, syncope, or edema.  Past Medical History:  Diagnosis Date   Arrhythmia    Premature beats   Coronary atherosclerosis of artery bypass graft    CABG 2009, LHC 07/2016 restenosis of SVG to OM treated with DES   Diabetes mellitus ([place])    Essential hypertension, benign    Fatty liver    History of kidney stones    HTN  (hypertension)    Moderate Aortic Stenosis    Murmur    PAD (peripheral artery disease) ([place])    Pure hypercholesterolemia     Past Surgical History:  Procedure Laterality Date   ABDOMINAL AORTOGRAM W/LOWER EXTREMITY N/A 12/30/2020   Procedure: ABDOMINAL AORTOGRAM W/LOWER EXTREMITY;  Surgeon: Wellington Hampshire, MD;  Location: [place] [facility];  Service: Cardiovascular;  Laterality: N/A;   ADENOIDECTOMY     CARDIAC CATHETERIZATION N/A 07/18/2016   Procedure: Left Heart Cath and Cors/Grafts Angiography;  Surgeon: Burnell Blanks, MD;  Location: [place] [facility];  Service: Cardiovascular;  Laterality: N/A;   CARDIAC CATHETERIZATION N/A 07/18/2016   Procedure: Coronary Stent Intervention;  Surgeon: Burnell Blanks, MD;  Location: [place] [facility];  Service: Cardiovascular;  Laterality: N/A;   CHOLECYSTECTOMY     CORONARY ANGIOPLASTY WITH STENT PLACEMENT     CORONARY ARTERY BYPASS GRAFT     CORONARY BALLOON ANGIOPLASTY N/A 04/01/2022   Procedure: CORONARY BALLOON ANGIOPLASTY;  Surgeon: Jettie Booze, MD;  Location: [place] [facility];  Service: Cardiovascular;  Laterality: N/A;  SVG-OM1   CORONARY BALLOON ANGIOPLASTY N/A 09/10/2021   Procedure: CORONARY BALLOON ANGIOPLASTY;  Surgeon: Sherren Mocha, MD;  Location: [place] [facility];  Service: Cardiovascular;  Laterality: N/A;   CORONARY STENT INTERVENTION N/A 01/11/2019   Procedure: CORONARY STENT INTERVENTION;  Surgeon: Sherren Mocha, MD;  Location: [place] [facility];  Service: Cardiovascular;  Laterality: N/A;   ENDARTERECTOMY FEMORAL Right 01/13/2021  Procedure: RIGHT FEMORAL ENDARTERECTOMY  WITH PATCH ANGIOPLASTY;  Surgeon: Rosetta Posner, MD;  Location: [place];  Service: Vascular;  Laterality: Right;   INGUINAL HERNIA REPAIR Right    LEFT HEART CATH AND CORS/GRAFTS ANGIOGRAPHY N/A 01/11/2019   Procedure: LEFT HEART CATH AND CORS/GRAFTS ANGIOGRAPHY;  Surgeon: Sherren Mocha, MD;  Location: [place] [facility];   Service: Cardiovascular;  Laterality: N/A;   LEFT HEART CATH AND CORS/GRAFTS ANGIOGRAPHY N/A 04/01/2022   Procedure: LEFT HEART CATH AND CORS/GRAFTS ANGIOGRAPHY;  Surgeon: Jettie Booze, MD;  Location: [place] [facility];  Service: Cardiovascular;  Laterality: N/A;   LEFT HEART CATH AND CORS/GRAFTS ANGIOGRAPHY N/A 09/10/2021   Procedure: LEFT HEART CATH AND CORS/GRAFTS ANGIOGRAPHY;  Surgeon: Sherren Mocha, MD;  Location: [place] [facility];  Service: Cardiovascular;  Laterality: N/A;   Right femoral endarterectomy and Dacron patch angioplasty  01/13/3021   TONSILLECTOMY      Current Medications: Current Meds  Medication Sig   amLODipine (NORVASC) 10 MG tablet Take 10 mg by mouth daily.   aspirin 81 MG EC tablet TAKE 1 TABLET BY MOUTH EVERY DAY   benazepril (LOTENSIN) 40 MG tablet Take 40 mg by mouth daily.    cholecalciferol (VITAMIN D3) 25 MCG (1000 UNIT) tablet Take 1,000 Units by mouth daily.   clopidogrel (PLAVIX) 75 MG tablet TAKE 1 TABLET BY MOUTH EVERY DAY   ELDERBERRY PO Take 1 tablet by mouth daily.   finasteride (PROSCAR) 5 MG tablet Take 5 mg by mouth daily.   glucose blood (PRECISION QID TEST) test strip 1 each by Other route as directed.   hydrochlorothiazide (HYDRODIURIL) 25 MG tablet TAKE 1 TABLET BY MOUTH EVERY DAY   insulin NPH Human (NOVOLIN N) 100 UNIT/ML injection Inject 15 Units into the skin 2 (two) times daily before a meal.   Insulin Pen Needle (B-D UF III MINI PEN NEEDLES) 31G X 5 MM MISC Use to inject Ozempic once a week   isosorbide mononitrate (IMDUR) 60 MG 24 hr tablet Take 1.5 tablets (90 mg total) by mouth 2 (two) times daily.   metFORMIN (GLUCOPHAGE) 1000 MG tablet Take 1,000 mg by mouth 2 (two) times daily with a meal.   metoprolol tartrate 75 MG TABS Take 75 mg by mouth 2 (two) times daily.   Multiple Vitamin (MULTIVITAMIN WITH MINERALS) TABS tablet Take 1 tablet by mouth daily.   nitroGLYCERIN (NITROSTAT) 0.4 MG SL tablet DISSOLVE 1 TABLET UNDER  THE TONGUE EVERY 5 MINUTES AS NEEDED FOR CHEST PAIN. MAX OF 3 DOSES, THEN 911.   phenazopyridine (PYRIDIUM) 100 MG tablet Take 1 tablet (100 mg total) by mouth daily. Urinary urgency   ranolazine (RANEXA) 1000 MG SR tablet Take 1 tablet (1,000 mg total) by mouth 2 (two) times daily.   rosuvastatin (CRESTOR) 20 MG tablet TAKE 1 TABLET BY MOUTH EVERY DAY   Zinc Gluconate 100 MG TABS Take 1 tablet by mouth daily.     Allergies:   Empagliflozin   Social History   Socioeconomic History   Marital status: Married    Spouse name: Not on file   Number of children: Not on file   Years of education: Not on file   Highest education level: Not on file  Occupational History   Not on file  Tobacco Use   Smoking status: Never   Smokeless tobacco: Never  Vaping Use   Vaping Use: Never used  Substance and Sexual Activity   Alcohol use: No   Drug use:  No   Sexual activity: Not on file  Other Topics Concern   Not on file  Social History Narrative   Not on file   Social Determinants of Health   Financial Resource Strain: Not on file  Food Insecurity: No Food Insecurity (07/16/2022)   Hunger Vital Sign    Worried About Running Out of Food in the Last Year: Never true    Ran Out of Food in the Last Year: Never true  Transportation Needs: Not on file  Physical Activity: Not on file  Stress: Not on file  Social Connections: Not on file     Family History: The patient's family history includes Heart disease in his mother. There is no history of Diabetes.  ROS:   Please see the history of present illness.    All other systems reviewed and are negative.  EKGs/Labs/Other Studies Reviewed:    The following studies were reviewed today: Cardiac Cath 04/01/2022:   Ost LAD to Prox LAD lesion is 100% stenosed.  LIMA to LAD is patent.   Dist LAD lesion is 70% stenosed.  Diffuse disease noted in the native LAD.   Prox RCA to Mid RCA lesion is 100% stenosed.  SVG to PDA is patent.   Dist RCA  lesion is 40% stenosed.   Ost RPDA to RPDA lesion is 40% stenosed.   Ost Cx to Prox Cx lesion is 99% stenosed.   SVG to OM is patent.  There is a Dist Graft to Insertion lesion is 80% stenosed.   Scoring balloon angioplasty was performed using a 1.75 mm score flex, followed by a BALL SAPPHIRE NC24 2.50X10.   Post intervention, there is a 10% residual stenosis.   There is moderate aortic valve stenosis.  Mean gradient 37 mmHg   Continue aggressive secondary prevention.  Dual antiplatelet therapy going forward.   Area of disease was distal in the graft and there is difficulty in delivering equipment as well.  I do not think an additional stent would be beneficial.  Could consider drug-eluting balloon if available in the event that he has further restenosis.  Diagnostic Dominance: Right Left Anterior Descending  Vessel is large.  Ost LAD to Prox LAD lesion is 100% stenosed. The lesion is chronically occluded.  Dist LAD lesion is 70% stenosed.    Left Circumflex  Ost Cx to Prox Cx lesion is 99% stenosed.    Second Obtuse Marginal Branch  Vessel is moderate in size.    Right Coronary Artery  Prox RCA to Mid RCA lesion is 100% stenosed.  Dist RCA lesion is 40% stenosed.    Right Posterior Descending Artery  Ost RPDA to RPDA lesion is 40% stenosed. The lesion is moderately calcified. The lesion was previously treated .    Saphenous Graft To Dist RCA  SVG and is normal in caliber.    Saphenous Graft To 2nd Mrg  SVG and is normal in caliber.  Dist Graft to Insertion lesion is 80% stenosed. The lesion was previously treated using a drug eluting stent over 2 years ago.    Saphenous Graft To 1st Diag  SVG and is normal in caliber.    LIMA LIMA Graft To Dist LAD  LIMA and is normal in caliber.    Intervention   Dist Graft to Insertion lesion (Saphenous Graft To 2nd Mrg)  Angioplasty  CATHETER LAUNCHER 6FR AL1 guide catheter was inserted. WIRE ASAHI PROWATER 180CM guidewire used to  cross lesion. Scoring balloon angioplasty was performed using a  BALL SAPPHIRE NC24 2.50X10. Initially, a 2.0 score flex balloon was advanced but would not cross. We then used a 2.0 semicompliant balloon to predilate. The 2.0 score flex still would not cross. We then placed a second wire for support which was the grand slam wire. We then used a 1.75 score flex balloon to pretreat. We then used a 2.5 [place] balloon after multiple inflations with a score flex had been done. There was significantly improved angiographic result. Areas to distal and the graft and there is difficulty in delivering equipment as well. I do not think an additional stent would be beneficial. Could consider drug-eluting balloon if available in the event that he has further restenosis.  Post-Intervention Lesion Assessment  The intervention was successful. Pre-interventional TIMI flow is 3. Post-intervention TIMI flow is 3. No complications occurred at this lesion.  There is a 10% residual stenosis post intervention.     Left Heart  Aortic Valve There is moderate aortic valve stenosis. There was some difficulty crossing the valve.  AL-1 guide with a straight wire used.   Coronary Diagrams  Diagnostic Dominance: Right  Intervention   EKG:  EKG is ordered today.  The ekg ordered today demonstrates sinus bradycardia 52 bpm, ST and T wave abnormality consider lateral ischemia.  2D echocardiogram 08/08/2022: 1. Left ventricular ejection fraction, by estimation, is 65 to 70%. The  left ventricle has normal function. The left ventricle has no regional  wall motion abnormalities. Left ventricular diastolic parameters were  normal.   2. Right ventricular systolic function is normal. The right ventricular  size is normal. There is normal pulmonary artery systolic pressure.   3. The mitral valve is normal in structure. Trivial mitral valve  regurgitation. No evidence of mitral stenosis.   4. The inferior vena cava is normal in size  with greater than 50%  respiratory variability, suggesting right atrial pressure of 3 mmHg.   5. The aortic valve is calcified. There is severe calcifcation of the  aortic valve. Aortic valve regurgitation is trivial. Severe aortic valve  stenosis. Vmax 4.3 m/s, MG 44 mmHg, AVA 0.8 cm^2, DI 0.26   Recent Labs: 03/30/2022: ALT 24 07/15/2022: Magnesium 1.8 07/16/2022: BUN 17; Creatinine, Ser 1.17; Hemoglobin 12.0; Platelets 250; Potassium 3.9; Sodium 140  Recent Lipid Panel    Component Value Date/Time   CHOL 160 07/27/2022 0909   TRIG 305 (H) 07/27/2022 0909   HDL 39 (L) 07/27/2022 0909   CHOLHDL 4.1 07/27/2022 0909   CHOLHDL 2.5 01/14/2021 0105   VLDL 26 01/14/2021 0105   LDLCALC 72 07/27/2022 0909     Risk Assessment/Calculations:                Physical Exam:    VS:  BP 126/70   Pulse (!) 52   Ht 5' 9"  (1.753 m)   Wt 211 lb 3.2 oz (95.8 kg)   SpO2 98%   BMI 31.19 kg/m     Wt Readings from Last 3 Encounters:  08/19/22 211 lb 3.2 oz (95.8 kg)  07/27/22 207 lb 3.2 oz (94 kg)  07/15/22 206 lb 8 oz (93.7 kg)     GEN:  Well nourished, well developed in no acute distress HEENT: Normal NECK: No JVD; No carotid bruits LYMPHATICS: No lymphadenopathy CARDIAC: RRR, there is a 3/6 harsh crescendo decrescendo murmur, mid to late peaking, at the right upper sternal border RESPIRATORY:  Clear to auscultation without rales, wheezing or rhonchi  ABDOMEN: Soft, non-tender, non-distended MUSCULOSKELETAL:  No  edema; No deformity  SKIN: Warm and dry NEUROLOGIC:  Alert and oriented x 3 PSYCHIATRIC:  Normal affect   ASSESSMENT:    1. Aortic valve disorder   2. Severe aortic stenosis   3. Coronary artery disease involving native coronary artery of native heart with angina pectoris ([place])   4. Pre-procedural cardiovascular examination    PLAN:    In order of problems listed above:  [name] [name] is very well-known to me with frequent follow-up over recent years because of  progressive lifestyle limiting angina.  He has undergone multiple PCI procedures following his multivessel CABG in 2009.  The patient has significant diffuse small vessel disease that is not amenable to revascularization.  I think he has received the maximal benefit of PCI procedures, but he continues to have lifestyle limiting angina and shortness of breath in spite of aggressive antianginal therapy with amlodipine, high-dose isosorbide, metoprolol, and ranolazine.  I suspect progressive aortic stenosis is playing a significant role.  See below for further discussion.  The patient will continue on dual antiplatelet therapy with aspirin and clopidogrel. I personally reviewed the patient's recent echo images.  He has progressed into the severe aortic stenosis range with a mean transvalvular gradient in excess of 40 mmHg and a peak systolic velocity greater than 4 m/s.  We discussed the natural history of aortic stenosis today, its potential contribution to his angina and shortness of breath, and reviewed treatment options.  His treatment options would include TAVR, redo sternotomy with surgical AVR, or palliative medical therapy.  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  He wants to be as active as possible and he is limited by NYHA functional class III symptoms.  He would like to undergo further evaluation for TAVR which I think is appropriate.  Will order CTA studies of the heart as well as the chest, abdomen, and pelvis, to evaluate anatomic suitability for TAVR as well as vascular access.  The patient has had a history of right femoral endarterectomy and may have issues with peripheral access, but CTA will help delineate this.  Once his CTA studies are completed, he will be referred for formal [facility] surgical consultation as part of a multidisciplinary approach to his care.  I do not think he will require repeat heart catheterization as this was just performed  within the past 6 months.  As part of his evaluation today, and this demonstrated a procedural animation of the TAVR procedure, reviewed procedural steps, expected recovery, and potential risks.           Medication Adjustments/Labs and Tests Ordered: Current medicines are reviewed at length with the patient today.  Concerns regarding medicines are outlined above.  Orders Placed This Encounter  Procedures   CT CORONARY MORPH W/CTA COR W/SCORE W/CA W/CM &/OR WO/CM   CT ANGIO ABDOMEN PELVIS  W &/OR WO CONTRAST   CT ANGIO CHEST AORTA W/CM & OR WO/CM   Basic metabolic panel   EKG 56-EPPI   No orders of the defined types were placed in this encounter.   Patient Instructions  Medication Instructions:  Your physician recommends that you continue on your current medications as directed. Please refer to the Current Medication list given to you today. *If you need a refill on your cardiac medications before your next appointment, please call your pharmacy*  Lab Work: BMP today If you have labs (blood work) drawn today and your tests are completely normal, you  will receive your results only by: [place] (if you have MyChart) OR A paper copy in the mail If you have any lab test that is abnormal or we need to change your treatment, we will call you to review the results.  Testing/Procedures: TAVR CT's Your physician has requested that you have cardiac CT. Cardiac computed tomography (CT) is a painless test that uses an x-ray machine to take clear, detailed pictures of your heart. For further information please visit HugeFiesta.tn. Please follow instruction sheet as given.  Follow-Up: At [facility], you and your health needs are our priority.  As part of our continuing mission to provide you with exceptional heart care, we have created designated Provider Care Teams.  These Care Teams include your primary Cardiologist (physician) and Advanced Practice Providers (APPs -   Physician Assistants and Nurse Practitioners) who all work together to provide you with the care you need, when you need it.  Your next appointment:   Structural Team will follow-up  The format for your next appointment:   In Person  Provider:   Sherren Mocha, MD      Important Information About Sugar         Signed, Sherren Mocha, MD  08/19/2022 12:29 PM    [place]

## 2022-08-26 ENCOUNTER — Ambulatory Visit (HOSPITAL_COMMUNITY)
Admission: RE | Admit: 2022-08-26 | Discharge: 2022-08-26 | Disposition: A | Discharge status: Home / Self Care | Payer: Medicare Other | Source: Ambulatory Visit | Attending: Cardiovascular Disease | Admitting: Cardiovascular Disease

## 2022-08-26 DIAGNOSIS — I350 Nonrheumatic aortic (valve) stenosis: Secondary | ICD-10-CM | POA: Insufficient documentation

## 2022-08-26 DIAGNOSIS — Z01818 Encounter for other preprocedural examination: Secondary | ICD-10-CM | POA: Diagnosis not present

## 2022-08-26 DIAGNOSIS — I359 Nonrheumatic aortic valve disorder, unspecified: Secondary | ICD-10-CM | POA: Insufficient documentation

## 2022-08-26 DIAGNOSIS — K5730 Diverticulosis of large intestine without perforation or abscess without bleeding: Secondary | ICD-10-CM | POA: Diagnosis not present

## 2022-08-26 DIAGNOSIS — K449 Diaphragmatic hernia without obstruction or gangrene: Secondary | ICD-10-CM | POA: Diagnosis not present

## 2022-08-26 MED ORDER — IOHEXOL 350 MG/ML SOLN
100.0000 mL | Freq: Once | INTRAVENOUS | Status: AC | PRN
  Administered 2022-08-26: 100 mL via INTRAVENOUS

## 2022-08-29 ENCOUNTER — Ambulatory Visit (HOSPITAL_COMMUNITY)
Admission: RE | Admit: 2022-08-29 | Discharge: 2022-08-29 | Disposition: A | Discharge status: Home / Self Care | Payer: Medicare Other | Source: Ambulatory Visit | Attending: Nurse Practitioner | Admitting: Nurse Practitioner

## 2022-08-29 DIAGNOSIS — I25119 Atherosclerotic heart disease of native coronary artery with unspecified angina pectoris: Secondary | ICD-10-CM | POA: Insufficient documentation

## 2022-08-29 DIAGNOSIS — I6523 Occlusion and stenosis of bilateral carotid arteries: Secondary | ICD-10-CM | POA: Insufficient documentation

## 2022-08-31 ENCOUNTER — Ambulatory Visit: Payer: Medicare Other | Admitting: Cardiovascular Disease

## 2022-08-31 ENCOUNTER — Other Ambulatory Visit: Payer: Self-pay

## 2022-08-31 DIAGNOSIS — E785 Hyperlipidemia, unspecified: Secondary | ICD-10-CM

## 2022-08-31 DIAGNOSIS — I6523 Occlusion and stenosis of bilateral carotid arteries: Secondary | ICD-10-CM

## 2022-08-31 MED ORDER — ROSUVASTATIN CALCIUM 40 MG PO TABS: 40.0000 mg | ORAL_TABLET | Freq: Every day | ORAL | 3 refills | Status: AC

## 2022-08-31 NOTE — Progress Notes (Signed)
Place repeat order per message from Finis Bud, NP

## 2022-09-07 DIAGNOSIS — R972 Elevated prostate specific antigen [PSA]: Secondary | ICD-10-CM | POA: Diagnosis not present

## 2022-09-09 NOTE — Progress Notes (Signed)
[address]       [place] [address]             [phone]           Luisdaniel W Shinn [place] Medical Record #[id number] Date of Birth: Jul 23, 1946  Sherren Mocha, MD Marda Stalker, PA-C  Chief Complaint:   Angina  History of Present Illness:     Pt is a very pleasant 76 yo wm who has had a CABG in the past with several PCIs following. He continues to be symptomatic with exertional CP that is relieved with rest. He also premedicates with NTG prior to exerting himself to keep from having CP. He has no lightheadedness, syncope, palpitations or lower ext edema. He has known moderate AS and in a recent echo was found to have progressed to severe AS with  amean gradient of 44 mmHg and peak gradient of 75 mmHg. He has normal LV function and a valve area of 0.8cm2. It was felt best to proceed with TAVR to relieve the symptomatic AS. He had a cath with CAD but small vessel disease and TAVR CTA with acceptable measurements. Of note he did have a Right femoral patch angioplasty for PAD. He has no further issues with his lower right leg.      Past Medical History:  Diagnosis Date   Arrhythmia    Premature beats   Coronary atherosclerosis of artery bypass graft    CABG 2009, LHC 07/2016 restenosis of SVG to OM treated with DES   Diabetes mellitus ([place])    Essential hypertension, benign    Fatty liver    History of kidney stones    HTN (hypertension)    Moderate Aortic Stenosis    Murmur    PAD (peripheral artery disease) ([place])    Pure hypercholesterolemia     Past Surgical History:  Procedure Laterality Date   ABDOMINAL AORTOGRAM W/LOWER EXTREMITY N/A 12/30/2020   Procedure: ABDOMINAL AORTOGRAM W/LOWER EXTREMITY;  Surgeon: Wellington Hampshire, MD;  Location: [facility];  Service: Cardiovascular;  Laterality: N/A;   ADENOIDECTOMY     CARDIAC CATHETERIZATION N/A 07/18/2016   Procedure: Left Heart Cath and Cors/Grafts Angiography;  Surgeon: Burnell Blanks, MD;  Location: [facility];  Service: Cardiovascular;  Laterality: N/A;   CARDIAC CATHETERIZATION N/A 07/18/2016   Procedure: Coronary Stent Intervention;  Surgeon: Burnell Blanks, MD;  Location: [facility];  Service: Cardiovascular;  Laterality: N/A;   CHOLECYSTECTOMY     CORONARY ANGIOPLASTY WITH STENT PLACEMENT     CORONARY ARTERY BYPASS GRAFT     CORONARY BALLOON ANGIOPLASTY N/A 04/01/2022   Procedure: CORONARY BALLOON ANGIOPLASTY;  Surgeon: Jettie Booze, MD;  Location: [facility];  Service: Cardiovascular;  Laterality: N/A;  SVG-OM1   CORONARY BALLOON ANGIOPLASTY N/A 09/10/2021   Procedure: CORONARY BALLOON ANGIOPLASTY;  Surgeon: Sherren Mocha, MD;  Location: [facility];  Service: Cardiovascular;  Laterality: N/A;   CORONARY STENT INTERVENTION N/A 01/11/2019   Procedure: CORONARY STENT INTERVENTION;  Surgeon: Sherren Mocha, MD;  Location: [facility];  Service: Cardiovascular;  Laterality: N/A;   ENDARTERECTOMY FEMORAL Right 01/13/2021   Procedure: RIGHT FEMORAL ENDARTERECTOMY  WITH PATCH ANGIOPLASTY;  Surgeon: Rosetta Posner, MD;  Location: [facility];  Service: Vascular;  Laterality: Right;   INGUINAL HERNIA REPAIR Right    LEFT HEART CATH AND CORS/GRAFTS ANGIOGRAPHY N/A 01/11/2019   Procedure: LEFT HEART CATH AND  CORS/GRAFTS ANGIOGRAPHY;  Surgeon: Sherren Mocha, MD;  Location: [facility];  Service: Cardiovascular;  Laterality: N/A;   LEFT HEART CATH AND CORS/GRAFTS ANGIOGRAPHY N/A 04/01/2022   Procedure: LEFT HEART CATH AND CORS/GRAFTS ANGIOGRAPHY;  Surgeon: Jettie Booze, MD;  Location: [facility];  Service: Cardiovascular;  Laterality: N/A;   LEFT HEART CATH AND CORS/GRAFTS ANGIOGRAPHY N/A 09/10/2021   Procedure: LEFT HEART CATH AND CORS/GRAFTS ANGIOGRAPHY;  Surgeon: Sherren Mocha, MD;  Location: [facility];  Service: Cardiovascular;  Laterality: N/A;   Right femoral endarterectomy and Dacron  patch angioplasty  01/13/3021   TONSILLECTOMY      Social History   Tobacco Use  Smoking Status Never  Smokeless Tobacco Never    Social History   Substance and Sexual Activity  Alcohol Use No    Social History   Socioeconomic History   Marital status: Married    Spouse name: Not on file   Number of children: Not on file   Years of education: Not on file   Highest education level: Not on file  Occupational History   Not on file  Tobacco Use   Smoking status: Never   Smokeless tobacco: Never  Vaping Use   Vaping Use: Never used  Substance and Sexual Activity   Alcohol use: No   Drug use: No   Sexual activity: Not on file  Other Topics Concern   Not on file  Social History Narrative   Not on file   Social Determinants of Health   Financial Resource Strain: Not on file  Food Insecurity: No Food Insecurity (07/16/2022)   Hunger Vital Sign    Worried About Running Out of Food in the Last Year: Never true    Ran Out of Food in the Last Year: Never true  Transportation Needs: Not on file  Physical Activity: Not on file  Stress: Not on file  Social Connections: Not on file  Intimate Partner Violence: Not At Risk (07/16/2022)   Humiliation, Afraid, Rape, and Kick questionnaire    Fear of Current or Ex-Partner: No    Emotionally Abused: No    Physically Abused: No    Sexually Abused: No    Allergies  Allergen Reactions   Empagliflozin Other (See Comments)    Abdominal pain/dizziness    Current Outpatient Medications  Medication Sig Dispense Refill   amLODipine (NORVASC) 10 MG tablet Take 10 mg by mouth daily.     aspirin 81 MG EC tablet TAKE 1 TABLET BY MOUTH EVERY DAY 90 tablet 3   benazepril (LOTENSIN) 40 MG tablet Take 40 mg by mouth daily.      cholecalciferol (VITAMIN D3) 25 MCG (1000 UNIT) tablet Take 1,000 Units by mouth daily.     clopidogrel (PLAVIX) 75 MG tablet TAKE 1 TABLET BY MOUTH EVERY DAY 90 tablet 3   ELDERBERRY PO Take 1 tablet by mouth  daily.     finasteride (PROSCAR) 5 MG tablet Take 5 mg by mouth daily.     glucose blood (PRECISION QID TEST) test strip 1 each by Other route as directed.     hydrochlorothiazide (HYDRODIURIL) 25 MG tablet TAKE 1 TABLET BY MOUTH EVERY DAY 90 tablet 3   insulin NPH Human (NOVOLIN N) 100 UNIT/ML injection Inject 15 Units into the skin 2 (two) times daily before a meal.     Insulin Pen Needle (B-D UF III MINI PEN NEEDLES) 31G X 5 MM MISC Use to inject Ozempic once a week  isosorbide mononitrate (IMDUR) 60 MG 24 hr tablet Take 1.5 tablets (90 mg total) by mouth 2 (two) times daily. 90 tablet 2   metFORMIN (GLUCOPHAGE) 1000 MG tablet Take 1,000 mg by mouth 2 (two) times daily with a meal.     metoprolol tartrate 75 MG TABS Take 75 mg by mouth 2 (two) times daily. 60 tablet 2   Multiple Vitamin (MULTIVITAMIN WITH MINERALS) TABS tablet Take 1 tablet by mouth daily.     nitroGLYCERIN (NITROSTAT) 0.4 MG SL tablet DISSOLVE 1 TABLET UNDER THE TONGUE EVERY 5 MINUTES AS NEEDED FOR CHEST PAIN. MAX OF 3 DOSES, THEN 911. 25 tablet 11   phenazopyridine (PYRIDIUM) 100 MG tablet Take 1 tablet (100 mg total) by mouth daily. Urinary urgency 10 tablet    ranolazine (RANEXA) 1000 MG SR tablet Take 1 tablet (1,000 mg total) by mouth 2 (two) times daily. 60 tablet 11   rosuvastatin (CRESTOR) 40 MG tablet Take 1 tablet (40 mg total) by mouth daily. 90 tablet 3   Zinc Gluconate 100 MG TABS Take 1 tablet by mouth daily.     No current facility-administered medications for this visit.     Family History  Problem Relation Age of Onset   Heart disease Mother        No details   Diabetes Neg Hx        Physical Exam: There were no vitals taken for this visit. Lungs: clear Card: rr with 3/6 sem Ext: warm and well perfused Neuro: no focal deficits Teeth in good repair    Diagnostic Studies & Laboratory data: I have personally reviewed the following studies and agree with the findings    TTE  (07/2022) IMPRESSIONS   1. Left ventricular ejection fraction, by estimation, is 65 to 70%. The  left ventricle has normal function. The left ventricle has no regional  wall motion abnormalities. Left ventricular diastolic parameters were  normal.   2. Right ventricular systolic function is normal. The right ventricular  size is normal. There is normal pulmonary artery systolic pressure.   3. The mitral valve is normal in structure. Trivial mitral valve  regurgitation. No evidence of mitral stenosis.   4. The inferior vena cava is normal in size with greater than 50%  respiratory variability, suggesting right atrial pressure of 3 mmHg.   5. The aortic valve is calcified. There is severe calcifcation of the  aortic valve. Aortic valve regurgitation is trivial. Severe aortic valve  stenosis. Vmax 4.3 m/s, MG 44 mmHg, AVA 0.8 cm^2, DI 0.26   FINDINGS   Left Ventricle: Left ventricular ejection fraction, by estimation, is 65  to 70%. The left ventricle has normal function. The left ventricle has no  regional wall motion abnormalities. The left ventricular internal cavity  size was normal in size. There is   no left ventricular hypertrophy. Left ventricular diastolic parameters  were normal.   Right Ventricle: The right ventricular size is normal. No increase in  right ventricular wall thickness. Right ventricular systolic function is  normal. There is normal pulmonary artery systolic pressure. The tricuspid  regurgitant velocity is 1.78 m/s, and   with an assumed right atrial pressure of 3 mmHg, the estimated right  ventricular systolic pressure is 16.1 mmHg.   Left Atrium: Left atrial size was normal in size.   Right Atrium: Right atrial size was normal in size.   Pericardium: There is no evidence of pericardial effusion.   Mitral Valve: The mitral valve is normal in  structure. Trivial mitral  valve regurgitation. No evidence of mitral valve stenosis.   Tricuspid Valve: The  tricuspid valve is normal in structure. Tricuspid  valve regurgitation is mild.   Aortic Valve: The aortic valve is calcified. There is severe calcifcation  of the aortic valve. Aortic valve regurgitation is trivial. Aortic  regurgitation PHT measures 590 msec. Severe aortic stenosis is present.  Aortic valve mean gradient measures 44.0  mmHg. Aortic valve peak gradient measures 75.0 mmHg. Aortic valve area, by  VTI measures 0.83 cm.   Pulmonic Valve: The pulmonic valve was not well visualized. Pulmonic valve  regurgitation is trivial.   Aorta: The aortic root and ascending aorta are structurally normal, with  no evidence of dilitation.   Venous: The inferior vena cava is normal in size with greater than 50%  respiratory variability, suggesting right atrial pressure of 3 mmHg.   IAS/Shunts: The interatrial septum was not well visualized.     LEFT VENTRICLE  PLAX 2D  LVIDd:         5.70 cm   Diastology  LVIDs:         3.80 cm   LV e' medial:    7.29 cm/s  LV PW:         0.80 cm   LV E/e' medial:  12.1  LV IVS:        0.80 cm   LV e' lateral:   13.20 cm/s  LVOT diam:     2.00 cm   LV E/e' lateral: 6.7  LV SV:         85  LV SV Index:   40  LVOT Area:     3.14 cm     RIGHT VENTRICLE  RV Basal diam:  3.60 cm  RV S prime:     14.50 cm/s  TAPSE (M-mode): 2.3 cm  RVSP:           15.7 mmHg   LEFT ATRIUM             Index        RIGHT ATRIUM           Index  LA diam:        4.60 cm 2.19 cm/m   RA Pressure: 3.00 mmHg  LA Vol (A2C):   70.8 ml 33.76 ml/m  RA Area:     14.40 cm  LA Vol (A4C):   55.3 ml 26.37 ml/m  RA Volume:   40.00 ml  19.07 ml/m  LA Biplane Vol: 62.9 ml 29.99 ml/m   AORTIC VALVE  AV Area (Vmax):    0.87 cm  AV Area (Vmean):   0.84 cm  AV Area (VTI):     0.83 cm  AV Vmax:           433.00 cm/s  AV Vmean:          303.000 cm/s  AV VTI:            1.020 m  AV Peak Grad:      75.0 mmHg  AV Mean Grad:      44.0 mmHg  LVOT Vmax:         120.00 cm/s   LVOT Vmean:        80.700 cm/s  LVOT VTI:          0.269 m  LVOT/AV VTI ratio: 0.26  AI PHT:            590 msec    AORTA  Ao Root diam: 3.30 cm  Ao Asc diam:  3.50 cm   MITRAL VALVE                TRICUSPID VALVE  MV Area (PHT):              TR Peak grad:   12.7 mmHg  MV Decel Time:              TR Vmax:        178.00 cm/s  MV E velocity: 88.00 cm/s   Estimated RAP:  3.00 mmHg  MV A velocity: 112.00 cm/s  RVSP:           15.7 mmHg  MV E/A ratio:  0.79                              SHUNTS                              Systemic VTI:  0.27 m                              Systemic Diam: 2.00 cm   CATH (03/2022) Conclusion   Ost LAD to Prox LAD lesion is 100% stenosed.  LIMA to LAD is patent.   Dist LAD lesion is 70% stenosed.  Diffuse disease noted in the native LAD.   Prox RCA to Mid RCA lesion is 100% stenosed.  SVG to PDA is patent.   Dist RCA lesion is 40% stenosed.   Ost RPDA to RPDA lesion is 40% stenosed.   Ost Cx to Prox Cx lesion is 99% stenosed.   SVG to OM is patent.  There is a Dist Graft to Insertion lesion is 80% stenosed.   Scoring balloon angioplasty was performed using a 1.75 mm score flex, followed by a BALL SAPPHIRE NC24 2.50X10.   Post intervention, there is a 10% residual stenosis.   There is moderate aortic valve stenosis.  Mean gradient 37 mmHg   Continue aggressive secondary prevention.  Dual antiplatelet therapy going forward.   Area of disease was distal in the graft and there is difficulty in delivering equipment as well.  I do not think an additional stent would be beneficial.  Could consider drug-eluting balloon if available in the event that he has further restenosis.   Recent Radiology Findings:   TAVR CTA (08/2022)   FINDINGS: Aortic Root:   Aortic valve: Trileaflet   Aortic valve calcium score: 2370   Aortic annulus:   Diameter: 56m x 298m  Perimeter: 7870m Area: 467 mm^2   Calcifications: No calcifications   Coronary height: Min  Left - 71m43min Right - 17mm42minotubular height: Left cusp - 22mm;51mht cusp - 24mm; 39moronary cusp - 26mm   46m (as measured 3 mm below the annulus):   Diameter: 29mm x 266m  Ar67m487 mm^2   Calcifications: No calcifications   Aortic sinus width: Left cusp - 34mm; Righ52msp - 32mm; Nonco69mry cusp - 35mm   Sinot71mar junction width: 29mm x 28mm  38mimum33moroscopic Angle for Delivery: LAO 11 CRA 10   Cardiac:   Right atrium: Normal size   Right ventricle: Normal size   Pulmonary arteries: Normal size   Pulmonary veins: Normal configuration   Left atrium: Mild enlargement  Left ventricle: Normal size   Pericardium: Normal thickness   Coronary arteries: S/p CABG with patent LIMA-LAD, SVG-diagonal, SVG-OM, and SVG-PDA   IMPRESSION: 1. Trileaflet aortic valve with severe calcifications (AV calcium score 2370)   2. Aortic annulus measures 58m x 257min diameter with perimeter 7866mnd area 467 mm^2. No annular or LVOT calcifications. Annular measurements are suitable for delivery of a 64m48mwards Sapien 3 valve   3. Sufficient coronary to annulus distance, measuring 12mm13mleft main and 17mm 42mCA   4. Optimum Fluoroscopic Angle for Delivery:  LAO 11 CRA 10   5. S/p CABG with patent LIMA-LAD, SVG-diagonal, SVG-OM, and SVG-PDA      Recent Lab Findings: Lab Results  Component Value Date   WBC 8.1 07/16/2022   HGB 12.0 (L) 07/16/2022   HCT 35.6 (L) 07/16/2022   PLT 250 07/16/2022   GLUCOSE 146 (H) 08/19/2022   CHOL 160 07/27/2022   TRIG 305 (H) 07/27/2022   HDL 39 (L) 07/27/2022   LDLCALC 72 07/27/2022   ALT 24 03/30/2022   AST 23 03/30/2022   NA 139 08/19/2022   K 4.4 08/19/2022   CL 101 08/19/2022   CREATININE 1.15 08/19/2022   BUN 15 08/19/2022   CO2 23 08/19/2022   INR 1.0 01/11/2021   HGBA1C 7.1 (H) 07/15/2022      Assessment / Plan:     Pt with severe symptomatic AS with NYHA class 3 symptoms and a class I  indication for AVR. With his age, previous CABG and PAD, he would best be served with TAVR with a 64mm S75mn valve via the left femoral approach. All the risks and goals of the procedure were discussed and he wishes to proceed. We discussed bail out options and in case of a limited complication we would proceed with surgery.   I have spent 60 min in review of the records, viewing studies and in face to face with patient and in coordination of future care    Mafalda Mcginniss WeCoralie Common2023 10:51 AM

## 2022-09-12 ENCOUNTER — Institutional Professional Consult (permissible substitution) (INDEPENDENT_AMBULATORY_CARE_PROVIDER_SITE_OTHER): Payer: Medicare Other | Admitting: Thoracic Surgery (Cardiothoracic Vascular Surgery)

## 2022-09-12 ENCOUNTER — Encounter: Payer: Self-pay | Admitting: Thoracic Surgery (Cardiothoracic Vascular Surgery)

## 2022-09-12 VITALS — BP 154/77 | HR 75 | Resp 18 | Ht 69.0 in | Wt 211.0 lb

## 2022-09-12 DIAGNOSIS — I350 Nonrheumatic aortic (valve) stenosis: Secondary | ICD-10-CM | POA: Diagnosis not present

## 2022-09-12 NOTE — Patient Instructions (Signed)
Schedule for TAVR

## 2022-09-14 DIAGNOSIS — R3912 Poor urinary stream: Secondary | ICD-10-CM | POA: Diagnosis not present

## 2022-09-14 DIAGNOSIS — R972 Elevated prostate specific antigen [PSA]: Secondary | ICD-10-CM | POA: Diagnosis not present

## 2022-09-14 DIAGNOSIS — N401 Enlarged prostate with lower urinary tract symptoms: Secondary | ICD-10-CM | POA: Diagnosis not present

## 2022-09-21 ENCOUNTER — Other Ambulatory Visit: Payer: Self-pay

## 2022-09-21 DIAGNOSIS — I350 Nonrheumatic aortic (valve) stenosis: Secondary | ICD-10-CM

## 2022-09-23 DIAGNOSIS — Z0000 Encounter for general adult medical examination without abnormal findings: Secondary | ICD-10-CM | POA: Diagnosis not present

## 2022-09-23 DIAGNOSIS — Z6831 Body mass index (BMI) 31.0-31.9, adult: Secondary | ICD-10-CM | POA: Diagnosis not present

## 2022-09-23 DIAGNOSIS — Z1389 Encounter for screening for other disorder: Secondary | ICD-10-CM | POA: Diagnosis not present

## 2022-09-30 ENCOUNTER — Telehealth: Payer: Self-pay | Admitting: Cardiology

## 2022-09-30 ENCOUNTER — Encounter (HOSPITAL_COMMUNITY)
Admission: RE | Admit: 2022-09-30 | Discharge: 2022-09-30 | Disposition: A | Discharge status: Home / Self Care | Payer: Medicare Other | Source: Ambulatory Visit | Attending: Cardiovascular Disease | Admitting: Cardiovascular Disease

## 2022-09-30 ENCOUNTER — Ambulatory Visit (HOSPITAL_COMMUNITY)
Admission: RE | Admit: 2022-09-30 | Discharge: 2022-09-30 | Disposition: A | Discharge status: Home / Self Care | Payer: Medicare Other | Source: Ambulatory Visit | Attending: Cardiovascular Disease | Admitting: Cardiovascular Disease

## 2022-09-30 DIAGNOSIS — I350 Nonrheumatic aortic (valve) stenosis: Secondary | ICD-10-CM | POA: Insufficient documentation

## 2022-09-30 DIAGNOSIS — Z01818 Encounter for other preprocedural examination: Secondary | ICD-10-CM | POA: Diagnosis not present

## 2022-09-30 DIAGNOSIS — Z1152 Encounter for screening for COVID-19: Secondary | ICD-10-CM | POA: Insufficient documentation

## 2022-09-30 LAB — CBC
HCT: 42.3 % (ref 39.0–52.0)
Hemoglobin: 14.3 g/dL (ref 13.0–17.0)
MCH: 29.6 pg (ref 26.0–34.0)
MCHC: 33.8 g/dL (ref 30.0–36.0)
MCV: 87.6 fL (ref 80.0–100.0)
Platelets: 233 10*3/uL (ref 150–400)
RBC: 4.83 MIL/uL (ref 4.22–5.81)
RDW: 13 % (ref 11.5–15.5)
WBC: 6.9 10*3/uL (ref 4.0–10.5)
nRBC: 0 % (ref 0.0–0.2)

## 2022-09-30 LAB — COMPREHENSIVE METABOLIC PANEL
ALT: 20 U/L (ref 0–44)
AST: 24 U/L (ref 15–41)
Albumin: 4.3 g/dL (ref 3.5–5.0)
Alkaline Phosphatase: 52 U/L (ref 38–126)
Anion gap: 12 (ref 5–15)
BUN: 14 mg/dL (ref 8–23)
CO2: 22 mmol/L (ref 22–32)
Calcium: 9.4 mg/dL (ref 8.9–10.3)
Chloride: 102 mmol/L (ref 98–111)
Creatinine, Ser: 1.37 mg/dL — ABNORMAL HIGH (ref 0.61–1.24)
GFR, Estimated: 53 mL/min — ABNORMAL LOW (ref >60)
Glucose, Bld: 175 mg/dL — ABNORMAL HIGH (ref 70–99)
Potassium: 4 mmol/L (ref 3.5–5.1)
Sodium: 136 mmol/L (ref 135–145)
Total Bilirubin: 0.8 mg/dL (ref 0.3–1.2)
Total Protein: 7.3 g/dL (ref 6.5–8.1)

## 2022-09-30 LAB — TYPE AND SCREEN
ABO/RH(D): A POS
Antibody Screen: NEGATIVE

## 2022-09-30 LAB — URINALYSIS, ROUTINE W REFLEX MICROSCOPIC
Bilirubin Urine: NEGATIVE
Glucose, UA: NEGATIVE mg/dL
Hgb urine dipstick: NEGATIVE
Ketones, ur: NEGATIVE mg/dL
Leukocytes,Ua: NEGATIVE
Nitrite: NEGATIVE
Protein, ur: NEGATIVE mg/dL
Specific Gravity, Urine: 1.016 (ref 1.005–1.030)
pH: 5 (ref 5.0–8.0)

## 2022-09-30 LAB — SARS CORONAVIRUS 2 (TAT 6-24 HRS): SARS Coronavirus 2: NEGATIVE

## 2022-09-30 LAB — SURGICAL PCR SCREEN
MRSA, PCR: NEGATIVE
Staphylococcus aureus: POSITIVE — AB

## 2022-09-30 LAB — PROTIME-INR
INR: 1 (ref 0.8–1.2)
Prothrombin Time: 13.4 seconds (ref 11.4–15.2)

## 2022-09-30 NOTE — Telephone Encounter (Signed)
Patients pre-TAVR labs returned with slightly elevated Cr at 1.37 from a baseline around 1.0. Will have him hold HCTZ until after TAVR and recheck BMET while inpatient.   Kathyrn Drown NP-C Structural Heart Team  Pager: [phone] Phone: [phone]

## 2022-09-30 NOTE — Progress Notes (Addendum)
Patient signed all consents at PAT lab appointment. CHG soap and instructions were given to patient. CHG surgical prep reviewed with patient and all questions answered.  Theodosia Quay, RN and Angelena Form, PA-C made aware of following abnormal labs: Surgical PCR - MSSA positive

## 2022-10-03 MED ORDER — NOREPINEPHRINE 4 MG/250ML-% IV SOLN
0.0000 ug/min | INTRAVENOUS | Status: DC
  Filled 2022-10-03: qty 250

## 2022-10-03 MED ORDER — POTASSIUM CHLORIDE 2 MEQ/ML IV SOLN
80.0000 meq | INTRAVENOUS | Status: DC
  Filled 2022-10-03: qty 40

## 2022-10-03 MED ORDER — CEFAZOLIN SODIUM-DEXTROSE 2-4 GM/100ML-% IV SOLN
2.0000 g | INTRAVENOUS | Status: AC
  Administered 2022-10-04: 2 g via INTRAVENOUS
  Filled 2022-10-03: qty 100

## 2022-10-03 MED ORDER — DEXMEDETOMIDINE HCL IN NACL 400 MCG/100ML IV SOLN
0.1000 ug/kg/h | INTRAVENOUS | Status: DC
  Filled 2022-10-03: qty 100

## 2022-10-03 MED ORDER — HEPARIN 30,000 UNITS/1000 ML (OHS) CELLSAVER SOLUTION
Status: DC
  Filled 2022-10-03: qty 1000

## 2022-10-03 MED ORDER — MAGNESIUM SULFATE 50 % IJ SOLN
40.0000 meq | INTRAMUSCULAR | Status: DC
  Filled 2022-10-03: qty 9.85

## 2022-10-03 NOTE — Anesthesia Preprocedure Evaluation (Signed)
Anesthesia Evaluation  Patient identified by MRN, date of birth, ID band Patient awake    Reviewed: Allergy & Precautions, NPO status , Patient's Chart, lab work & pertinent test results, reviewed documented beta blocker date and time   History of Anesthesia Complications Negative for: history of anesthetic complications  Airway Mallampati: II  TM Distance: >3 FB Neck ROM: Full    Dental  (+) Dental Advisory Given   Pulmonary neg pulmonary ROS   Pulmonary exam normal        Cardiovascular hypertension, Pt. on medications and Pt. on home beta blockers + CAD, + Past MI, + Cardiac Stents, + CABG and + Peripheral Vascular Disease  + Valvular Problems/Murmurs AS  Rhythm:Regular Rate:Normal + Systolic murmurs  '23 Carotid US - 40-59% right ICAS, 1-39% left ICAS  '23 TTE - EF 65 to 70%. Trivial mitral valve regurgitation. Aortic valve regurgitation is trivial. Severe aortic valve stenosis. Vmax 4.3 m/s, MG 44 mmHg, AVA 0.8 cm^2, DI 0.26   '23 Cath -   Ost LAD to Prox LAD lesion is 100% stenosed.  LIMA to LAD is patent.   Dist LAD lesion is 70% stenosed.  Diffuse disease noted in the native LAD.   Prox RCA to Mid RCA lesion is 100% stenosed.  SVG to PDA is patent.   Dist RCA lesion is 40% stenosed.   Ost RPDA to RPDA lesion is 40% stenosed.   Ost Cx to Prox Cx lesion is 99% stenosed.   SVG to OM is patent.  There is a Dist Graft to Insertion lesion is 80% stenosed.   Scoring balloon angioplasty was performed using a 1.75 mm score flex, followed by a BALL SAPPHIRE NC24 2.50X10.   Post intervention, there is a 10% residual stenosis.   There is moderate aortic valve stenosis.  Mean gradient 37 mmHg    Neuro/Psych negative neurological ROS  negative psych ROS   GI/Hepatic negative GI ROS, Neg liver ROS,,,  Endo/Other  diabetes, Type 2, Oral Hypoglycemic Agents, Insulin Dependent    Renal/GU Renal InsufficiencyRenal  disease     Musculoskeletal negative musculoskeletal ROS (+)    Abdominal   Peds  Hematology  On Plavix    Anesthesia Other Findings   Reproductive/Obstetrics                              Anesthesia Physical Anesthesia Plan  ASA: 4  Anesthesia Plan: MAC   Post-op Pain Management: Tylenol PO (pre-op)* and Minimal or no pain anticipated   Induction:   PONV Risk Score and Plan: 1 and Propofol infusion and Treatment may vary due to age or medical condition  Airway Management Planned: Nasal Cannula and Natural Airway  Additional Equipment: Arterial line  Intra-op Plan:   Post-operative Plan:   Informed Consent: I have reviewed the patients History and Physical, chart, labs and discussed the procedure including the risks, benefits and alternatives for the proposed anesthesia with the patient or authorized representative who has indicated his/her understanding and acceptance.       Plan Discussed with: CRNA and Anesthesiologist  Anesthesia Plan Comments:          Anesthesia Quick Evaluation

## 2022-10-03 NOTE — H&P (Signed)
[address]       [place] [address]             [phone]                                   [facility] Medical Record #[id number] Date of Birth: Oct 17, 1946   Sherren Mocha, MD Marda Stalker, PA-C   Chief Complaint:   Angina   History of Present Illness:     Pt is a very pleasant 76 yo wm who has had a CABG in the past with several PCIs following. He continues to be symptomatic with exertional CP that is relieved with rest. He also premedicates with NTG prior to exerting himself to keep from having CP. He has no lightheadedness, syncope, palpitations or lower ext edema. He has known moderate AS and in a recent echo was found to have progressed to severe AS with  amean gradient of 44 mmHg and peak gradient of 75 mmHg. He has normal LV function and a valve area of 0.8cm2. It was felt best to proceed with TAVR to relieve the symptomatic AS. He had a cath with CAD but small vessel disease and TAVR CTA with acceptable measurements. Of note he did have a Right femoral patch angioplasty for PAD. He has no further issues with his lower right leg.             Past Medical History:  Diagnosis Date   Arrhythmia      Premature beats   Coronary atherosclerosis of artery bypass graft      CABG 2009, LHC 07/2016 restenosis of SVG to OM treated with DES   Diabetes mellitus ([place])     Essential hypertension, benign     Fatty liver     History of kidney stones     HTN (hypertension)     Moderate Aortic Stenosis     Murmur     PAD (peripheral artery disease) ([place])     Pure hypercholesterolemia             Past Surgical History:  Procedure Laterality Date   ABDOMINAL AORTOGRAM W/LOWER EXTREMITY N/A 12/30/2020    Procedure: ABDOMINAL AORTOGRAM W/LOWER EXTREMITY;  Surgeon: Wellington Hampshire, MD;  Location: [facility];  Service: Cardiovascular;  Laterality: N/A;   ADENOIDECTOMY       CARDIAC CATHETERIZATION N/A 07/18/2016    Procedure: Left  Heart Cath and Cors/Grafts Angiography;  Surgeon: Burnell Blanks, MD;  Location: [facility];  Service: Cardiovascular;  Laterality: N/A;   CARDIAC CATHETERIZATION N/A 07/18/2016    Procedure: Coronary Stent Intervention;  Surgeon: Burnell Blanks, MD;  Location: [facility];  Service: Cardiovascular;  Laterality: N/A;   CHOLECYSTECTOMY       CORONARY ANGIOPLASTY WITH STENT PLACEMENT       CORONARY ARTERY BYPASS GRAFT       CORONARY BALLOON ANGIOPLASTY N/A 04/01/2022    Procedure: CORONARY BALLOON ANGIOPLASTY;  Surgeon: Jettie Booze, MD;  Location: [facility];  Service: Cardiovascular;  Laterality: N/A;  SVG-OM1   CORONARY BALLOON ANGIOPLASTY N/A 09/10/2021    Procedure: CORONARY BALLOON ANGIOPLASTY;  Surgeon: Sherren Mocha, MD;  Location: [facility];  Service: Cardiovascular;  Laterality: N/A;   CORONARY STENT INTERVENTION N/A 01/11/2019    Procedure: CORONARY STENT INTERVENTION;  Surgeon: Sherren Mocha,  MD;  Location: [facility];  Service: Cardiovascular;  Laterality: N/A;   ENDARTERECTOMY FEMORAL Right 01/13/2021    Procedure: RIGHT FEMORAL ENDARTERECTOMY  WITH PATCH ANGIOPLASTY;  Surgeon: Rosetta Posner, MD;  Location: [facility];  Service: Vascular;  Laterality: Right;   INGUINAL HERNIA REPAIR Right     LEFT HEART CATH AND CORS/GRAFTS ANGIOGRAPHY N/A 01/11/2019    Procedure: LEFT HEART CATH AND CORS/GRAFTS ANGIOGRAPHY;  Surgeon: Sherren Mocha, MD;  Location: [facility];  Service: Cardiovascular;  Laterality: N/A;   LEFT HEART CATH AND CORS/GRAFTS ANGIOGRAPHY N/A 04/01/2022    Procedure: LEFT HEART CATH AND CORS/GRAFTS ANGIOGRAPHY;  Surgeon: Jettie Booze, MD;  Location: [facility];  Service: Cardiovascular;  Laterality: N/A;   LEFT HEART CATH AND CORS/GRAFTS ANGIOGRAPHY N/A 09/10/2021    Procedure: LEFT HEART CATH AND CORS/GRAFTS ANGIOGRAPHY;  Surgeon: Sherren Mocha, MD;  Location: [facility];  Service:  Cardiovascular;  Laterality: N/A;   Right femoral endarterectomy and Dacron patch angioplasty   01/13/3021   TONSILLECTOMY          Social History       Tobacco Use  Smoking Status Never  Smokeless Tobacco Never    Social History       Substance and Sexual Activity  Alcohol Use No      Social History         Socioeconomic History   Marital status: Married      Spouse name: Not on file   Number of children: Not on file   Years of education: Not on file   Highest education level: Not on file  Occupational History   Not on file  Tobacco Use   Smoking status: Never   Smokeless tobacco: Never  Vaping Use   Vaping Use: Never used  Substance and Sexual Activity   Alcohol use: No   Drug use: No   Sexual activity: Not on file  Other Topics Concern   Not on file  Social History Narrative   Not on file    Social Determinants of Health        Financial Resource Strain: Not on file  Food Insecurity: No Food Insecurity (07/16/2022)    Hunger Vital Sign     Worried About Running Out of Food in the Last Year: Never true     Ran Out of Food in the Last Year: Never true  Transportation Needs: Not on file  Physical Activity: Not on file  Stress: Not on file  Social Connections: Not on file  Intimate Partner Violence: Not At Risk (07/16/2022)    Humiliation, Afraid, Rape, and Kick questionnaire     Fear of Current or Ex-Partner: No     Emotionally Abused: No     Physically Abused: No     Sexually Abused: No           Allergies  Allergen Reactions   Empagliflozin Other (See Comments)      Abdominal pain/dizziness            Current Outpatient Medications  Medication Sig Dispense Refill   amLODipine (NORVASC) 10 MG tablet Take 10 mg by mouth daily.       aspirin 81 MG EC tablet TAKE 1 TABLET BY MOUTH EVERY DAY 90 tablet 3   benazepril (LOTENSIN) 40 MG tablet Take 40 mg by mouth daily.        cholecalciferol (VITAMIN D3) 25 MCG (1000 UNIT) tablet Take 1,000 Units  by mouth daily.       clopidogrel (PLAVIX) 75 MG tablet TAKE 1 TABLET BY MOUTH EVERY DAY 90 tablet 3   ELDERBERRY PO Take 1 tablet by mouth daily.       finasteride (PROSCAR) 5 MG tablet Take 5 mg by mouth daily.       glucose blood (PRECISION QID TEST) test strip 1 each by Other route as directed.       hydrochlorothiazide (HYDRODIURIL) 25 MG tablet TAKE 1 TABLET BY MOUTH EVERY DAY 90 tablet 3   insulin NPH Human (NOVOLIN N) 100 UNIT/ML injection Inject 15 Units into the skin 2 (two) times daily before a meal.       Insulin Pen Needle (B-D UF III MINI PEN NEEDLES) 31G X 5 MM MISC Use to inject Ozempic once a week       isosorbide mononitrate (IMDUR) 60 MG 24 hr tablet Take 1.5 tablets (90 mg total) by mouth 2 (two) times daily. 90 tablet 2   metFORMIN (GLUCOPHAGE) 1000 MG tablet Take 1,000 mg by mouth 2 (two) times daily with a meal.       metoprolol tartrate 75 MG TABS Take 75 mg by mouth 2 (two) times daily. 60 tablet 2   Multiple Vitamin (MULTIVITAMIN WITH MINERALS) TABS tablet Take 1 tablet by mouth daily.       nitroGLYCERIN (NITROSTAT) 0.4 MG SL tablet DISSOLVE 1 TABLET UNDER THE TONGUE EVERY 5 MINUTES AS NEEDED FOR CHEST PAIN. MAX OF 3 DOSES, THEN 911. 25 tablet 11   phenazopyridine (PYRIDIUM) 100 MG tablet Take 1 tablet (100 mg total) by mouth daily. Urinary urgency 10 tablet     ranolazine (RANEXA) 1000 MG SR tablet Take 1 tablet (1,000 mg total) by mouth 2 (two) times daily. 60 tablet 11   rosuvastatin (CRESTOR) 40 MG tablet Take 1 tablet (40 mg total) by mouth daily. 90 tablet 3   Zinc Gluconate 100 MG TABS Take 1 tablet by mouth daily.        No current facility-administered medications for this visit.             Family History  Problem Relation Age of Onset   Heart disease Mother          No details   Diabetes Neg Hx              Physical Exam: There were no vitals taken for this visit. Lungs: clear Card: rr with 3/6 sem Ext: warm and well perfused Neuro: no focal  deficits Teeth in good repair       Diagnostic Studies & Laboratory data: I have personally reviewed the following studies and agree with the findings    TTE (07/2022) IMPRESSIONS   1. Left ventricular ejection fraction, by estimation, is 65 to 70%. The  left ventricle has normal function. The left ventricle has no regional  wall motion abnormalities. Left ventricular diastolic parameters were  normal.   2. Right ventricular systolic function is normal. The right ventricular  size is normal. There is normal pulmonary artery systolic pressure.   3. The mitral valve is normal in structure. Trivial mitral valve  regurgitation. No evidence of mitral stenosis.   4. The inferior vena cava is normal in size with greater than 50%  respiratory variability, suggesting right atrial pressure of 3 mmHg.   5. The aortic valve is calcified. There is severe calcifcation of the  aortic valve. Aortic valve regurgitation is trivial. Severe aortic valve  stenosis.  Vmax 4.3 m/s, MG 44 mmHg, AVA 0.8 cm^2, DI 0.26   FINDINGS   Left Ventricle: Left ventricular ejection fraction, by estimation, is 65  to 70%. The left ventricle has normal function. The left ventricle has no  regional wall motion abnormalities. The left ventricular internal cavity  size was normal in size. There is   no left ventricular hypertrophy. Left ventricular diastolic parameters  were normal.   Right Ventricle: The right ventricular size is normal. No increase in  right ventricular wall thickness. Right ventricular systolic function is  normal. There is normal pulmonary artery systolic pressure. The tricuspid  regurgitant velocity is 1.78 m/s, and   with an assumed right atrial pressure of 3 mmHg, the estimated right  ventricular systolic pressure is 47.8 mmHg.   Left Atrium: Left atrial size was normal in size.   Right Atrium: Right atrial size was normal in size.   Pericardium: There is no evidence of pericardial effusion.    Mitral Valve: The mitral valve is normal in structure. Trivial mitral  valve regurgitation. No evidence of mitral valve stenosis.   Tricuspid Valve: The tricuspid valve is normal in structure. Tricuspid  valve regurgitation is mild.   Aortic Valve: The aortic valve is calcified. There is severe calcifcation  of the aortic valve. Aortic valve regurgitation is trivial. Aortic  regurgitation PHT measures 590 msec. Severe aortic stenosis is present.  Aortic valve mean gradient measures 44.0  mmHg. Aortic valve peak gradient measures 75.0 mmHg. Aortic valve area, by  VTI measures 0.83 cm.   Pulmonic Valve: The pulmonic valve was not well visualized. Pulmonic valve  regurgitation is trivial.   Aorta: The aortic root and ascending aorta are structurally normal, with  no evidence of dilitation.   Venous: The inferior vena cava is normal in size with greater than 50%  respiratory variability, suggesting right atrial pressure of 3 mmHg.   IAS/Shunts: The interatrial septum was not well visualized.     LEFT VENTRICLE  PLAX 2D  LVIDd:         5.70 cm   Diastology  LVIDs:         3.80 cm   LV e' medial:    7.29 cm/s  LV PW:         0.80 cm   LV E/e' medial:  12.1  LV IVS:        0.80 cm   LV e' lateral:   13.20 cm/s  LVOT diam:     2.00 cm   LV E/e' lateral: 6.7  LV SV:         85  LV SV Index:   40  LVOT Area:     3.14 cm     RIGHT VENTRICLE  RV Basal diam:  3.60 cm  RV S prime:     14.50 cm/s  TAPSE (M-mode): 2.3 cm  RVSP:           15.7 mmHg   LEFT ATRIUM             Index        RIGHT ATRIUM           Index  LA diam:        4.60 cm 2.19 cm/m   RA Pressure: 3.00 mmHg  LA Vol (A2C):   70.8 ml 33.76 ml/m  RA Area:     14.40 cm  LA Vol (A4C):   55.3 ml 26.37 ml/m  RA Volume:   40.00 ml  19.07 ml/m  LA Biplane Vol: 62.9 ml 29.99 ml/m   AORTIC VALVE  AV Area (Vmax):    0.87 cm  AV Area (Vmean):   0.84 cm  AV Area (VTI):     0.83 cm  AV Vmax:           433.00 cm/s   AV Vmean:          303.000 cm/s  AV VTI:            1.020 m  AV Peak Grad:      75.0 mmHg  AV Mean Grad:      44.0 mmHg  LVOT Vmax:         120.00 cm/s  LVOT Vmean:        80.700 cm/s  LVOT VTI:          0.269 m  LVOT/AV VTI ratio: 0.26  AI PHT:            590 msec    AORTA  Ao Root diam: 3.30 cm  Ao Asc diam:  3.50 cm   MITRAL VALVE                TRICUSPID VALVE  MV Area (PHT):              TR Peak grad:   12.7 mmHg  MV Decel Time:              TR Vmax:        178.00 cm/s  MV E velocity: 88.00 cm/s   Estimated RAP:  3.00 mmHg  MV A velocity: 112.00 cm/s  RVSP:           15.7 mmHg  MV E/A ratio:  0.79                              SHUNTS                              Systemic VTI:  0.27 m                              Systemic Diam: 2.00 cm    CATH (03/2022) Conclusion   Ost LAD to Prox LAD lesion is 100% stenosed.  LIMA to LAD is patent.   Dist LAD lesion is 70% stenosed.  Diffuse disease noted in the native LAD.   Prox RCA to Mid RCA lesion is 100% stenosed.  SVG to PDA is patent.   Dist RCA lesion is 40% stenosed.   Ost RPDA to RPDA lesion is 40% stenosed.   Ost Cx to Prox Cx lesion is 99% stenosed.   SVG to OM is patent.  There is a Dist Graft to Insertion lesion is 80% stenosed.   Scoring balloon angioplasty was performed using a 1.75 mm score flex, followed by a BALL SAPPHIRE NC24 2.50X10.   Post intervention, there is a 10% residual stenosis.   There is moderate aortic valve stenosis.  Mean gradient 37 mmHg   Continue aggressive secondary prevention.  Dual antiplatelet therapy going forward.   Area of disease was distal in the graft and there is difficulty in delivering equipment as well.  I do not think an additional stent would be beneficial.  Could consider drug-eluting balloon if available in the event that he has further restenosis.  Recent Radiology Findings:   TAVR CTA (08/2022)    FINDINGS: Aortic Root:   Aortic valve: Trileaflet   Aortic valve calcium  score: 2370   Aortic annulus:   Diameter: 34m x 271m  Perimeter: 7848m Area: 467 mm^2   Calcifications: No calcifications   Coronary height: Min Left - 67m52min Right - 17mm65minotubular height: Left cusp - 22mm;72mht cusp - 24mm; 43moronary cusp - 26mm   44m (as measured 3 mm below the annulus):   Diameter: 29mm x 26m  Ar76m487 mm^2   Calcifications: No calcifications   Aortic sinus width: Left cusp - 34mm; Righ5msp - 32mm; Nonco22mry cusp - 35mm   Sinot64mar junction width: 29mm x 28mm  14mimum35moroscopic Angle for Delivery: LAO 11 CRA 10   Cardiac:   Right atrium: Normal size   Right ventricle: Normal size   Pulmonary arteries: Normal size   Pulmonary veins: Normal configuration   Left atrium: Mild enlargement   Left ventricle: Normal size   Pericardium: Normal thickness   Coronary arteries: S/p CABG with patent LIMA-LAD, SVG-diagonal, SVG-OM, and SVG-PDA   IMPRESSION: 1. Trileaflet aortic valve with severe calcifications (AV calcium score 2370)   2. Aortic annulus measures 28mm x 23mm in 82meter60mh perimeter 78mm and area 46772m2. No annular or LVOT calcifications. Annular measurements are suitable for delivery of a 26mm Edwards Sapie70mvalve   3. Sufficient coronary to annulus distance, measuring 67mm to left main a80m7mm to RCA   4. Opt25m Fluoroscopic Angle for Delivery:  LAO 11 CRA 10   5. S/p CABG with patent LIMA-LAD, SVG-diagonal, SVG-OM, and SVG-PDA       Recent Lab Findings: Recent Labs       Lab Results  Component Value Date    WBC 8.1 07/16/2022    HGB 12.0 (L) 07/16/2022    HCT 35.6 (L) 07/16/2022    PLT 250 07/16/2022    GLUCOSE 146 (H) 08/19/2022    CHOL 160 07/27/2022    TRIG 305 (H) 07/27/2022    HDL 39 (L) 07/27/2022    LDLCALC 72 07/27/2022    ALT 24 03/30/2022    AST 23 03/30/2022    NA 139 08/19/2022    K 4.4 08/19/2022    CL 101 08/19/2022    CREATININE 1.15 08/19/2022    BUN 15  08/19/2022    CO2 23 08/19/2022    INR 1.0 01/11/2021    HGBA1C 7.1 (H) 07/15/2022            Assessment / Plan:     Pt with severe symptomatic AS with NYHA class 3 symptoms and a class I indication for AVR. With his age, previous CABG and PAD, he would best be served with TAVR with a 26mm Sapien valve via41m left femoral approach. All the risks and goals of the procedure were discussed and he wishes to proceed. We discussed bail out options and in case of a limited complication we would proceed with surgery.

## 2022-10-04 ENCOUNTER — Inpatient Hospital Stay (HOSPITAL_COMMUNITY): Payer: Medicare Other | Admitting: Physician Assistant

## 2022-10-04 ENCOUNTER — Other Ambulatory Visit: Payer: Self-pay

## 2022-10-04 ENCOUNTER — Encounter (HOSPITAL_COMMUNITY)
Admission: RE | Disposition: A | Discharge status: Home / Self Care | Payer: Self-pay | Source: Home / Self Care | Attending: Cardiovascular Disease

## 2022-10-04 ENCOUNTER — Encounter (HOSPITAL_COMMUNITY): Payer: Self-pay | Admitting: Cardiovascular Disease

## 2022-10-04 ENCOUNTER — Inpatient Hospital Stay (HOSPITAL_COMMUNITY): Payer: Medicare Other

## 2022-10-04 ENCOUNTER — Inpatient Hospital Stay (HOSPITAL_COMMUNITY)
Admission: RE | Admit: 2022-10-04 | Discharge: 2022-10-06 | DRG: 267 | Disposition: A | Discharge status: Home / Self Care | Payer: Medicare Other | Attending: Cardiovascular Disease | Admitting: Cardiovascular Disease

## 2022-10-04 ENCOUNTER — Other Ambulatory Visit: Payer: Self-pay | Admitting: Physician Assistant

## 2022-10-04 DIAGNOSIS — I25110 Atherosclerotic heart disease of native coronary artery with unstable angina pectoris: Secondary | ICD-10-CM | POA: Diagnosis present

## 2022-10-04 DIAGNOSIS — Z952 Presence of prosthetic heart valve: Secondary | ICD-10-CM

## 2022-10-04 DIAGNOSIS — Z6830 Body mass index (BMI) 30.0-30.9, adult: Secondary | ICD-10-CM | POA: Diagnosis not present

## 2022-10-04 DIAGNOSIS — E1151 Type 2 diabetes mellitus with diabetic peripheral angiopathy without gangrene: Secondary | ICD-10-CM | POA: Diagnosis present

## 2022-10-04 DIAGNOSIS — I442 Atrioventricular block, complete: Secondary | ICD-10-CM | POA: Diagnosis present

## 2022-10-04 DIAGNOSIS — I350 Nonrheumatic aortic (valve) stenosis: Secondary | ICD-10-CM | POA: Diagnosis not present

## 2022-10-04 DIAGNOSIS — Z7902 Long term (current) use of antithrombotics/antiplatelets: Secondary | ICD-10-CM | POA: Diagnosis not present

## 2022-10-04 DIAGNOSIS — Z79899 Other long term (current) drug therapy: Secondary | ICD-10-CM

## 2022-10-04 DIAGNOSIS — Z794 Long term (current) use of insulin: Secondary | ICD-10-CM | POA: Diagnosis not present

## 2022-10-04 DIAGNOSIS — Z8249 Family history of ischemic heart disease and other diseases of the circulatory system: Secondary | ICD-10-CM

## 2022-10-04 DIAGNOSIS — K760 Fatty (change of) liver, not elsewhere classified: Secondary | ICD-10-CM | POA: Diagnosis present

## 2022-10-04 DIAGNOSIS — I459 Conduction disorder, unspecified: Secondary | ICD-10-CM | POA: Diagnosis present

## 2022-10-04 DIAGNOSIS — Z7984 Long term (current) use of oral hypoglycemic drugs: Secondary | ICD-10-CM | POA: Diagnosis not present

## 2022-10-04 DIAGNOSIS — E785 Hyperlipidemia, unspecified: Secondary | ICD-10-CM | POA: Diagnosis present

## 2022-10-04 DIAGNOSIS — I252 Old myocardial infarction: Secondary | ICD-10-CM

## 2022-10-04 DIAGNOSIS — I10 Essential (primary) hypertension: Secondary | ICD-10-CM | POA: Diagnosis present

## 2022-10-04 DIAGNOSIS — I2510 Atherosclerotic heart disease of native coronary artery without angina pectoris: Secondary | ICD-10-CM | POA: Diagnosis not present

## 2022-10-04 DIAGNOSIS — Z7982 Long term (current) use of aspirin: Secondary | ICD-10-CM | POA: Diagnosis not present

## 2022-10-04 DIAGNOSIS — Z006 Encounter for examination for normal comparison and control in clinical research program: Secondary | ICD-10-CM | POA: Diagnosis not present

## 2022-10-04 DIAGNOSIS — E7800 Pure hypercholesterolemia, unspecified: Secondary | ICD-10-CM | POA: Diagnosis present

## 2022-10-04 DIAGNOSIS — I739 Peripheral vascular disease, unspecified: Secondary | ICD-10-CM | POA: Diagnosis present

## 2022-10-04 DIAGNOSIS — Z950 Presence of cardiac pacemaker: Secondary | ICD-10-CM | POA: Insufficient documentation

## 2022-10-04 DIAGNOSIS — I25810 Atherosclerosis of coronary artery bypass graft(s) without angina pectoris: Secondary | ICD-10-CM | POA: Diagnosis present

## 2022-10-04 DIAGNOSIS — Z955 Presence of coronary angioplasty implant and graft: Secondary | ICD-10-CM

## 2022-10-04 DIAGNOSIS — Z888 Allergy status to other drugs, medicaments and biological substances status: Secondary | ICD-10-CM

## 2022-10-04 DIAGNOSIS — E669 Obesity, unspecified: Secondary | ICD-10-CM | POA: Diagnosis present

## 2022-10-04 DIAGNOSIS — E119 Type 2 diabetes mellitus without complications: Secondary | ICD-10-CM

## 2022-10-04 HISTORY — DX: Nonrheumatic aortic (valve) stenosis: I35.0

## 2022-10-04 HISTORY — PX: TRANSCATHETER AORTIC VALVE REPLACEMENT, TRANSFEMORAL: SHX6400

## 2022-10-04 HISTORY — DX: Presence of prosthetic heart valve: Z95.2

## 2022-10-04 HISTORY — PX: INTRAOPERATIVE TRANSTHORACIC ECHOCARDIOGRAM: SHX6523

## 2022-10-04 LAB — POCT I-STAT, CHEM 8
BUN: 15 mg/dL (ref 8–23)
Calcium, Ion: 1.24 mmol/L (ref 1.15–1.40)
Chloride: 105 mmol/L (ref 98–111)
Creatinine, Ser: 1 mg/dL (ref 0.61–1.24)
Glucose, Bld: 193 mg/dL — ABNORMAL HIGH (ref 70–99)
HCT: 34 % — ABNORMAL LOW (ref 39.0–52.0)
Hemoglobin: 11.6 g/dL — ABNORMAL LOW (ref 13.0–17.0)
Potassium: 4.2 mmol/L (ref 3.5–5.1)
Sodium: 139 mmol/L (ref 135–145)
TCO2: 21 mmol/L — ABNORMAL LOW (ref 22–32)

## 2022-10-04 LAB — ECHOCARDIOGRAM LIMITED
AR max vel: 4.67 cm2
AV Area VTI: 3.9 cm2
AV Area mean vel: 3.88 cm2
AV Mean grad: 2 mmHg
AV Peak grad: 3.8 mmHg
Ao pk vel: 0.97 m/s
S' Lateral: 2.4 cm

## 2022-10-04 LAB — GLUCOSE, CAPILLARY
Glucose-Capillary: 133 mg/dL — ABNORMAL HIGH (ref 70–99)
Glucose-Capillary: 165 mg/dL — ABNORMAL HIGH (ref 70–99)
Glucose-Capillary: 193 mg/dL — ABNORMAL HIGH (ref 70–99)
Glucose-Capillary: 194 mg/dL — ABNORMAL HIGH (ref 70–99)
Glucose-Capillary: 269 mg/dL — ABNORMAL HIGH (ref 70–99)

## 2022-10-04 SURGERY — IMPLANTATION, AORTIC VALVE, TRANSCATHETER, FEMORAL APPROACH
Anesthesia: Monitor Anesthesia Care | Site: Chest

## 2022-10-04 MED ORDER — NOREPINEPHRINE 4 MG/250ML-% IV SOLN
INTRAVENOUS | Status: DC | PRN
  Administered 2022-10-04 (×2): 1 ug/min via INTRAVENOUS

## 2022-10-04 MED ORDER — SODIUM CHLORIDE 0.9 % IV SOLN: INTRAVENOUS | Status: DC

## 2022-10-04 MED ORDER — INSULIN ASPART 100 UNIT/ML IJ SOLN
INTRAMUSCULAR | Status: AC
  Filled 2022-10-04: qty 1

## 2022-10-04 MED ORDER — FINASTERIDE 5 MG PO TABS
5.0000 mg | ORAL_TABLET | Freq: Every day | ORAL | Status: DC
  Administered 2022-10-04 – 2022-10-06 (×3): 5 mg via ORAL
  Filled 2022-10-04 (×3): qty 1

## 2022-10-04 MED ORDER — SODIUM CHLORIDE 0.9% FLUSH
3.0000 mL | Freq: Two times a day (BID) | INTRAVENOUS | Status: DC
  Administered 2022-10-04 – 2022-10-06 (×4): 3 mL via INTRAVENOUS

## 2022-10-04 MED ORDER — ASPIRIN 81 MG PO TBEC
81.0000 mg | DELAYED_RELEASE_TABLET | Freq: Every day | ORAL | Status: DC
  Administered 2022-10-04 – 2022-10-06 (×3): 81 mg via ORAL
  Filled 2022-10-04 (×3): qty 1

## 2022-10-04 MED ORDER — AMLODIPINE BESYLATE 10 MG PO TABS
10.0000 mg | ORAL_TABLET | Freq: Every day | ORAL | Status: DC
  Administered 2022-10-04 – 2022-10-06 (×3): 10 mg via ORAL
  Filled 2022-10-04 (×3): qty 1

## 2022-10-04 MED ORDER — HEPARIN SODIUM (PORCINE) 1000 UNIT/ML IJ SOLN
INTRAMUSCULAR | Status: AC
  Filled 2022-10-04: qty 1

## 2022-10-04 MED ORDER — INSULIN ASPART 100 UNIT/ML IJ SOLN
0.0000 [IU] | Freq: Three times a day (TID) | INTRAMUSCULAR | Status: DC
  Administered 2022-10-04 (×2): 12 [IU] via SUBCUTANEOUS
  Administered 2022-10-05: 2 [IU] via SUBCUTANEOUS
  Administered 2022-10-05: 12 [IU] via SUBCUTANEOUS
  Administered 2022-10-05 (×2): 4 [IU] via SUBCUTANEOUS
  Administered 2022-10-06 (×2): 12 [IU] via SUBCUTANEOUS

## 2022-10-04 MED ORDER — PROPOFOL 10 MG/ML IV BOLUS
INTRAVENOUS | Status: AC
  Filled 2022-10-04: qty 20

## 2022-10-04 MED ORDER — IODIXANOL 320 MG/ML IV SOLN
INTRAVENOUS | Status: DC | PRN
  Administered 2022-10-04: 43 mL

## 2022-10-04 MED ORDER — CHLORHEXIDINE GLUCONATE 4 % EX LIQD: 30.0000 mL | CUTANEOUS | Status: DC

## 2022-10-04 MED ORDER — CLOPIDOGREL BISULFATE 75 MG PO TABS
75.0000 mg | ORAL_TABLET | Freq: Every day | ORAL | Status: DC
  Administered 2022-10-04: 75 mg via ORAL
  Filled 2022-10-04: qty 1

## 2022-10-04 MED ORDER — BENAZEPRIL HCL 20 MG PO TABS
40.0000 mg | ORAL_TABLET | Freq: Every day | ORAL | Status: DC
  Administered 2022-10-04 – 2022-10-06 (×3): 40 mg via ORAL
  Filled 2022-10-04 (×4): qty 2

## 2022-10-04 MED ORDER — LIDOCAINE HCL 1 % IJ SOLN
INTRAMUSCULAR | Status: DC | PRN
  Administered 2022-10-04: 6 mL

## 2022-10-04 MED ORDER — HEPARIN 6000 UNIT IRRIGATION SOLUTION
Status: AC
  Filled 2022-10-04: qty 500

## 2022-10-04 MED ORDER — RANOLAZINE ER 500 MG PO TB12
500.0000 mg | ORAL_TABLET | Freq: Two times a day (BID) | ORAL | Status: DC
  Administered 2022-10-04 – 2022-10-06 (×5): 500 mg via ORAL
  Filled 2022-10-04 (×7): qty 1

## 2022-10-04 MED ORDER — CHLORHEXIDINE GLUCONATE 0.12 % MT SOLN
15.0000 mL | Freq: Once | OROMUCOSAL | Status: AC
  Administered 2022-10-04: 15 mL via OROMUCOSAL
  Filled 2022-10-04: qty 15

## 2022-10-04 MED ORDER — TRAMADOL HCL 50 MG PO TABS: 50.0000 mg | ORAL_TABLET | ORAL | Status: DC | PRN

## 2022-10-04 MED ORDER — ROSUVASTATIN CALCIUM 20 MG PO TABS
40.0000 mg | ORAL_TABLET | Freq: Every day | ORAL | Status: DC
  Administered 2022-10-04 – 2022-10-06 (×3): 40 mg via ORAL
  Filled 2022-10-04 (×3): qty 2

## 2022-10-04 MED ORDER — HYDROCHLOROTHIAZIDE 25 MG PO TABS
25.0000 mg | ORAL_TABLET | Freq: Every day | ORAL | Status: DC
  Administered 2022-10-04 – 2022-10-06 (×3): 25 mg via ORAL
  Filled 2022-10-04 (×3): qty 1

## 2022-10-04 MED ORDER — SODIUM CHLORIDE 0.9 % IV SOLN: INTRAVENOUS | Status: AC

## 2022-10-04 MED ORDER — FENTANYL CITRATE (PF) 100 MCG/2ML IJ SOLN
INTRAMUSCULAR | Status: DC | PRN
  Administered 2022-10-04 (×2): 50 ug via INTRAVENOUS

## 2022-10-04 MED ORDER — MORPHINE SULFATE (PF) 2 MG/ML IV SOLN: 1.0000 mg | INTRAVENOUS | Status: DC | PRN

## 2022-10-04 MED ORDER — HEPARIN 6000 UNIT IRRIGATION SOLUTION
Status: AC
  Filled 2022-10-04: qty 1500

## 2022-10-04 MED ORDER — ISOSORBIDE MONONITRATE ER 60 MG PO TB24
90.0000 mg | ORAL_TABLET | Freq: Two times a day (BID) | ORAL | Status: DC
  Administered 2022-10-04 – 2022-10-06 (×5): 90 mg via ORAL
  Filled 2022-10-04 (×5): qty 1

## 2022-10-04 MED ORDER — CEFAZOLIN SODIUM-DEXTROSE 2-4 GM/100ML-% IV SOLN
2.0000 g | Freq: Three times a day (TID) | INTRAVENOUS | Status: AC
  Administered 2022-10-04 (×2): 2 g via INTRAVENOUS
  Filled 2022-10-04 (×2): qty 100

## 2022-10-04 MED ORDER — LIDOCAINE HCL 1 % IJ SOLN
INTRAMUSCULAR | Status: AC
  Filled 2022-10-04: qty 20

## 2022-10-04 MED ORDER — PROTAMINE SULFATE 10 MG/ML IV SOLN
INTRAVENOUS | Status: DC | PRN
  Administered 2022-10-04: 10 mg via INTRAVENOUS
  Administered 2022-10-04: 40 mg via INTRAVENOUS
  Administered 2022-10-04: 10 mg via INTRAVENOUS
  Administered 2022-10-04 (×2): 40 mg via INTRAVENOUS

## 2022-10-04 MED ORDER — FENTANYL CITRATE (PF) 250 MCG/5ML IJ SOLN
INTRAMUSCULAR | Status: AC
  Filled 2022-10-04: qty 5

## 2022-10-04 MED ORDER — INSULIN ASPART 100 UNIT/ML IJ SOLN
0.0000 [IU] | INTRAMUSCULAR | Status: DC | PRN
  Administered 2022-10-04: 2 [IU] via SUBCUTANEOUS

## 2022-10-04 MED ORDER — PROPOFOL 1000 MG/100ML IV EMUL
INTRAVENOUS | Status: AC
  Filled 2022-10-04: qty 100

## 2022-10-04 MED ORDER — ACETAMINOPHEN 650 MG RE SUPP: 650.0000 mg | Freq: Four times a day (QID) | RECTAL | Status: DC | PRN

## 2022-10-04 MED ORDER — HEPARIN 6000 UNIT IRRIGATION SOLUTION
Status: DC | PRN
  Administered 2022-10-04: 3

## 2022-10-04 MED ORDER — SODIUM CHLORIDE 0.9% FLUSH: 3.0000 mL | INTRAVENOUS | Status: DC | PRN

## 2022-10-04 MED ORDER — CHLORHEXIDINE GLUCONATE 4 % EX LIQD: 60.0000 mL | Freq: Once | CUTANEOUS | Status: DC

## 2022-10-04 MED ORDER — ACETAMINOPHEN 500 MG PO TABS
1000.0000 mg | ORAL_TABLET | Freq: Once | ORAL | Status: AC
  Administered 2022-10-04: 1000 mg via ORAL
  Filled 2022-10-04: qty 2

## 2022-10-04 MED ORDER — ACETAMINOPHEN 325 MG PO TABS: 650.0000 mg | ORAL_TABLET | Freq: Four times a day (QID) | ORAL | Status: DC | PRN

## 2022-10-04 MED ORDER — PHENYLEPHRINE 80 MCG/ML (10ML) SYRINGE FOR IV PUSH (FOR BLOOD PRESSURE SUPPORT)
PREFILLED_SYRINGE | INTRAVENOUS | Status: DC | PRN
  Administered 2022-10-04: 40 ug via INTRAVENOUS

## 2022-10-04 MED ORDER — OXYCODONE HCL 5 MG PO TABS: 5.0000 mg | ORAL_TABLET | ORAL | Status: DC | PRN

## 2022-10-04 MED ORDER — HEPARIN SODIUM (PORCINE) 1000 UNIT/ML IJ SOLN
INTRAMUSCULAR | Status: DC | PRN
  Administered 2022-10-04: 14000 [IU] via INTRAVENOUS

## 2022-10-04 MED ORDER — LACTATED RINGERS IV SOLN: INTRAVENOUS | Status: DC | PRN

## 2022-10-04 MED ORDER — DEXMEDETOMIDINE HCL IN NACL 400 MCG/100ML IV SOLN
INTRAVENOUS | Status: DC | PRN
  Administered 2022-10-04: 1 ug/kg/h via INTRAVENOUS

## 2022-10-04 MED ORDER — SODIUM CHLORIDE 0.9 % IV SOLN
250.0000 mL | INTRAVENOUS | Status: DC | PRN
  Administered 2022-10-05: 250 mL via INTRAVENOUS

## 2022-10-04 MED ORDER — NITROGLYCERIN IN D5W 200-5 MCG/ML-% IV SOLN: 0.0000 ug/min | INTRAVENOUS | Status: DC

## 2022-10-04 MED ORDER — ONDANSETRON HCL 4 MG/2ML IJ SOLN: 4.0000 mg | Freq: Four times a day (QID) | INTRAMUSCULAR | Status: DC | PRN

## 2022-10-04 MED ORDER — PROPOFOL 500 MG/50ML IV EMUL
INTRAVENOUS | Status: DC | PRN
  Administered 2022-10-04: 20 ug/kg/min via INTRAVENOUS

## 2022-10-04 SURGICAL SUPPLY — 64 items
BAG COUNTER SPONGE SURGICOUNT (BAG) ×2 IMPLANT
BAG DECANTER FOR FLEXI CONT (MISCELLANEOUS) IMPLANT
BLADE CLIPPER SURG (BLADE) IMPLANT
BLADE STERNUM SYSTEM 6 (BLADE) IMPLANT
CABLE ADAPT CONN TEMP 6FT (ADAPTER) ×2 IMPLANT
CANISTER SUCT 3000ML PPV (MISCELLANEOUS) IMPLANT
CATH DIAG EXPO 6F AL1 (CATHETERS) IMPLANT
CATH DIAG EXPO 6F VENT PIG 145 (CATHETERS) ×4 IMPLANT
CATH INFINITI 6F AL2 (CATHETERS) IMPLANT
CATH S G BIP PACING (CATHETERS) ×2 IMPLANT
CHLORAPREP W/TINT 26 (MISCELLANEOUS) ×2 IMPLANT
CNTNR URN SCR LID CUP LEK RST (MISCELLANEOUS) ×4 IMPLANT
CONT SPEC 4OZ STRL OR WHT (MISCELLANEOUS) ×4
COVER BACK TABLE 80X110 HD (DRAPES) IMPLANT
DERMABOND ADVANCED .7 DNX12 (GAUZE/BANDAGES/DRESSINGS) ×2 IMPLANT
DEVICE CLOSURE PERCLS PRGLD 6F (VASCULAR PRODUCTS) ×4 IMPLANT
DRSG IV TEGADERM 3.5X4.5 STRL (GAUZE/BANDAGES/DRESSINGS) IMPLANT
DRSG TEGADERM 4X4.75 (GAUZE/BANDAGES/DRESSINGS) ×4 IMPLANT
ELECT REM PT RETURN 9FT ADLT (ELECTROSURGICAL) ×2
ELECTRODE REM PT RTRN 9FT ADLT (ELECTROSURGICAL) ×2 IMPLANT
GAUZE SPONGE 2X2 STRL 8-PLY (GAUZE/BANDAGES/DRESSINGS) IMPLANT
GAUZE SPONGE 4X4 12PLY STRL (GAUZE/BANDAGES/DRESSINGS) ×2 IMPLANT
GLOVE BIO SURGEON STRL SZ7.5 (GLOVE) ×2 IMPLANT
GLOVE BIO SURGEON STRL SZ8 (GLOVE) ×2 IMPLANT
GLOVE BIOGEL PI IND STRL 6.5 (GLOVE) IMPLANT
GLOVE BIOGEL PI IND STRL 7.5 (GLOVE) IMPLANT
GLOVE ECLIPSE 7.0 STRL STRAW (GLOVE) ×2 IMPLANT
GLOVE SURG SS PI 6.5 STRL IVOR (GLOVE) IMPLANT
GOWN STRL REUS W/ TWL LRG LVL3 (GOWN DISPOSABLE) IMPLANT
GOWN STRL REUS W/ TWL XL LVL3 (GOWN DISPOSABLE) ×2 IMPLANT
GOWN STRL REUS W/TWL LRG LVL3 (GOWN DISPOSABLE)
GOWN STRL REUS W/TWL XL LVL3 (GOWN DISPOSABLE) ×2
GUIDEWIRE SAF TJ AMPL .035X180 (WIRE) ×2 IMPLANT
GUIDEWIRE SAFE TJ AMPLATZ EXST (WIRE) ×2 IMPLANT
KIT BASIN OR (CUSTOM PROCEDURE TRAY) ×2 IMPLANT
KIT HEART LEFT (KITS) ×2 IMPLANT
KIT SAPIAN 3 ULTRA RESILIA 26 (Valve) IMPLANT
KIT TURNOVER KIT B (KITS) ×2 IMPLANT
NS IRRIG 1000ML POUR BTL (IV SOLUTION) ×2 IMPLANT
PACK ENDO MINOR (CUSTOM PROCEDURE TRAY) ×2 IMPLANT
PAD ARMBOARD 7.5X6 YLW CONV (MISCELLANEOUS) ×4 IMPLANT
PAD ELECT DEFIB RADIOL ZOLL (MISCELLANEOUS) ×2 IMPLANT
PERCLOSE PROGLIDE 6F (VASCULAR PRODUCTS) ×4
POSITIONER HEAD DONUT 9IN (MISCELLANEOUS) ×2 IMPLANT
SET MICROPUNCTURE 5F STIFF (MISCELLANEOUS) ×2 IMPLANT
SHEATH BRITE TIP 7FR 35CM (SHEATH) ×2 IMPLANT
SHEATH PINNACLE 6F 10CM (SHEATH) ×2 IMPLANT
SHEATH PINNACLE 8F 10CM (SHEATH) ×2 IMPLANT
SLEEVE REPOSITIONING LENGTH 30 (MISCELLANEOUS) ×2 IMPLANT
SPIKE FLUID TRANSFER (MISCELLANEOUS) ×2 IMPLANT
SPONGE T-LAP 18X18 ~~LOC~~+RFID (SPONGE) ×2 IMPLANT
STOPCOCK MORSE 400PSI 3WAY (MISCELLANEOUS) ×4 IMPLANT
SUT SILK  1 MH (SUTURE) ×2
SUT SILK 1 MH (SUTURE) ×2 IMPLANT
SYR 50ML LL SCALE MARK (SYRINGE) ×2 IMPLANT
SYR BULB IRRIG 60ML STRL (SYRINGE) IMPLANT
TOWEL GREEN STERILE (TOWEL DISPOSABLE) ×4 IMPLANT
TRANSDUCER W/STOPCOCK (MISCELLANEOUS) ×4 IMPLANT
TRAY FOLEY SLVR 14FR TEMP STAT (SET/KITS/TRAYS/PACK) IMPLANT
TUBE SUCT INTRACARD DLP 20F (MISCELLANEOUS) IMPLANT
WIRE EMERALD 3MM-J .035X150CM (WIRE) ×2 IMPLANT
WIRE EMERALD 3MM-J .035X260CM (WIRE) ×2 IMPLANT
WIRE EMERALD ST .035X260CM (WIRE) ×4 IMPLANT
WIRE SAFARI SM CURVE 275 (WIRE) ×2 IMPLANT

## 2022-10-04 NOTE — Discharge Instructions (Signed)
Supplemental Discharge Instructions for  Pacemaker/Defibrillator Patients    Activity No heavy lifting or vigorous activity with your left/right arm for 6 to 8 weeks.  Do not raise your left/right arm above your head for one week.  Gradually raise your affected arm as drawn below.             10/10/22                  10/11/22                 10/12/22                10/13/22 __  NO DRIVING until cleared to at your wound check visit.  WOUND CARE Keep the wound area clean and dry.  Do not get this area wet , no showers until cleared toat your wound check visit  . The tape/steri-strips on your wound will fall off; do not pull them off.  No bandage is needed on the site.  DO  NOT apply any creams, oils, or ointments to the wound area. If you notice any drainage or discharge from the wound, any swelling or bruising at the site, or you develop a fever > 101? F after you are discharged home, call the office at once.  Special Instructions You are still able to use cellular telephones; use the ear opposite the side where you have your pacemaker/defibrillator.  Avoid carrying your cellular phone near your device. When traveling through airports, show security personnel your identification card to avoid being screened in the metal detectors.  Ask the security personnel to use the hand wand. Avoid arc welding equipment, MRI testing (magnetic resonance imaging), TENS units (transcutaneous nerve stimulators).  Call the office for questions about other devices. Avoid electrical appliances that are in poor condition or are not properly grounded. Microwave ovens are safe to be near or to operate.     ACTIVITY AND EXERCISE  Daily activity and exercise are an important part of your recovery. People recover at different rates depending on their general health and type of valve procedure.  Most people recovering from TAVR feel better relatively quickly   No lifting, pushing, pulling more than 10  pounds (examples to avoid: groceries, vacuuming, gardening, golfing):             - For one week with a procedure through the groin.             - For six weeks for procedures through the chest wall or neck. NOTE: You will typically see one of our providers 7-14 days after your procedure to discuss WHEN TO RESUME the above activities.      DRIVING  Do not drive until you are seen for follow up and cleared by a provider. Generally, we ask patient to not drive for 1 week after their procedure.  If you have been told by your doctor in the past that you may not drive, you must talk with him/her before you begin driving again.   DRESSING  Groin site: you may leave the clear dressing over the site for up to one week or until it falls off.   HYGIENE  If you had a femoral (leg) procedure, you may take a shower when you return home. After the shower, pat the site dry. Do NOT use powder, oils or lotions in your groin area until the site has completely healed.  If you had a chest procedure, you  may shower when you return home unless specifically instructed not to by your discharging practitioner.             - DO NOT scrub incision; pat dry with a towel.             - DO NOT apply any lotions, oils, powders to the incision.             - No tub baths / swimming for at least 2 weeks.  If you notice any fevers, chills, increased pain, swelling, bleeding or pus, please contact your doctor.   ADDITIONAL INFORMATION  If you are going to have an upcoming dental procedure, please contact our office as you will require antibiotics ahead of time to prevent infection on your heart valve.    If you have any questions or concerns you can call the structural heart phone during normal business hours 8am-4pm. If you have an urgent need after hours or weekends please call [phone] to talk to the on call provider for general cardiology. If you have an emergency that requires immediate attention, please call 911.     After TAVR Checklist  Check  Test Description   Follow up appointment in 1-2 weeks  You will see our structural heart advanced practice provider. Your incision sites will be checked and you will be cleared to drive and resume all normal activities if you are doing well.     1 month echo and follow up  You will have an echo to check on your new heart valve and be seen back in the office by a structural heart advanced practice provider.   Follow up with your primary cardiologist You will need to be seen by your primary cardiologist in the following 3-6 months after your 1 month appointment in the valve clinic.    1 year echo and follow up You will have another echo to check on your heart valve after 1 year and be seen back in the office by a structural heart advanced practice provider. This your last structural heart visit.   Bacterial endocarditis prophylaxis  You will have to take antibiotics for the rest of your life before all dental procedures (even teeth cleanings) to protect your heart valve. Antibiotics are also required before some surgeries. Please check with your cardiologist before scheduling any surgeries. Also, please make sure to tell us if you have a penicillin allergy as you will require an alternative antibiotic.

## 2022-10-04 NOTE — Anesthesia Postprocedure Evaluation (Signed)
Anesthesia Post Note  Patient: [name] [name]  Procedure(s) Performed: Transcatheter Aortic Valve Replacement, Transfemoral (Left: Chest) INTRAOPERATIVE TRANSTHORACIC ECHOCARDIOGRAM     Patient location during evaluation: PACU Anesthesia Type: MAC Level of consciousness: awake and alert Pain management: pain level controlled Vital Signs Assessment: post-procedure vital signs reviewed and stable Respiratory status: spontaneous breathing, nonlabored ventilation and respiratory function stable Cardiovascular status: stable and blood pressure returned to baseline Anesthetic complications: no   No notable events documented.  Last Vitals:  Vitals:   10/04/22 0958 10/04/22 1051  BP: (!) 126/56 (!) 118/54  Pulse: 60 60  Resp:  17  Temp: 36.7 C 36.7 C  SpO2:      Last Pain:  Vitals:   10/04/22 1051  TempSrc: Temporal  PainSc: 0-No pain                 Audry Pili

## 2022-10-04 NOTE — Interval H&P Note (Signed)
History and Physical Interval Note:  10/04/2022 6:30 AM  [name] [name]  has presented today for surgery, with the diagnosis of Severe Aortic Stenosis.  The various methods of treatment have been discussed with the patient and family. After consideration of risks, benefits and other options for treatment, the patient has consented to  Procedure(s): Transcatheter Aortic Valve Replacement, Transfemoral (Left) INTRAOPERATIVE TRANSTHORACIC ECHOCARDIOGRAM (N/A) as a surgical intervention.  The patient's history has been reviewed, patient examined, no change in status, stable for surgery.  I have reviewed the patient's chart and labs.  Questions were answered to the patient's satisfaction.     Coralie Common

## 2022-10-04 NOTE — Progress Notes (Signed)
  [place] [facility] VALVE TEAM  Patient doing well s/p TAVR. He is hemodynamically stable. Groin sites stable, temp wire in left groin. Tele shows pacing with PVCs. I turned pacer down to 30s and he remains pacer dependant. Continue bed rest while temp wire in.  Angelena Form PA-C  [facility]  Pager [phone]

## 2022-10-04 NOTE — Anesthesia Procedure Notes (Signed)
Procedure Name: MAC Date/Time: 10/04/2022 7:34 AM  Performed by: Mariea Clonts, CRNAPre-anesthesia Checklist: Patient identified, Emergency Drugs available, Suction available, Patient being monitored and Timeout performed Patient Re-evaluated:Patient Re-evaluated prior to induction Oxygen Delivery Method: Simple face mask Preoxygenation: Pre-oxygenation with 100% oxygen

## 2022-10-04 NOTE — TOC Initial Note (Signed)
Transition of Care [facility]) - Initial/Assessment Note    Patient Details  Name: [name] [name] MRN: [id number] Date of Birth: Feb 23, 1946  Transition of Care [facility]) CM/SW Contact:    Erenest Rasher, RN Phone Number: [phone] 10/04/2022, 5:41 PM  Clinical Narrative:                 TOC CM spoke to pt and states he was independent PTA and still works.  Will continue to follow for dc needs.   Expected Discharge Plan: Home/Self Care Barriers to Discharge: Continued Medical Work up   Patient Goals and CMS Choice        Expected Discharge Plan and Services Expected Discharge Plan: Home/Self Care   Discharge Planning Services: CM Consult                 Prior Living Arrangements/Services     Patient language and need for interpreter reviewed:: Yes Do you feel safe going back to the place where you live?: Yes      Need for Family Participation in Patient Care: No (Comment) Care giver support system in place?: Yes (comment)   Criminal Activity/Legal Involvement Pertinent to Current Situation/Hospitalization: No - Comment as needed  Activities of Daily Living Home Assistive Devices/Equipment: Eyeglasses, Hearing aid ADL Screening (condition at time of admission) Patient's cognitive ability adequate to safely complete daily activities?: Yes Is the patient deaf or have difficulty hearing?: Yes Does the patient have difficulty seeing, even when wearing glasses/contacts?: No Does the patient have difficulty concentrating, remembering, or making decisions?: No Patient able to express need for assistance with ADLs?: Yes Does the patient have difficulty dressing or bathing?: No Independently performs ADLs?: Yes (appropriate for developmental age) Does the patient have difficulty walking or climbing stairs?: No Weakness of Legs: None Weakness of Arms/Hands: None  Permission Sought/Granted Permission sought to share information with : Case Manager, Family  Supports Permission granted to share information with : Yes, Verbal Permission Granted              Emotional Assessment Appearance:: Appears stated age Attitude/Demeanor/Rapport: Engaged Affect (typically observed): Accepting Orientation: : Oriented to Self, Oriented to Place, Oriented to  Time, Oriented to Situation   Psych Involvement: No (comment)  Admission diagnosis:  S/P TAVR (transcatheter aortic valve replacement) [Z95.2] Patient Active Problem List   Diagnosis Date Noted   Severe aortic stenosis 10/04/2022   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   Bilateral carotid artery stenosis 07/27/2022   PAD (peripheral artery disease) ([place]) 01/13/2021   CAD (coronary artery disease) 01/18/2019   History of colon polyps 06/28/2018   Nuclear sclerosis of both eyes 06/10/2016   Insulin dependent type 2 diabetes mellitus ([place]) 06/10/2016   Epistaxis 04/10/2012   Mixed conductive and sensorineural hearing loss of both ears 04/10/2012   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

## 2022-10-04 NOTE — Anesthesia Procedure Notes (Signed)
Arterial Line Insertion Start/End12/19/2023 7:05 AM, 10/04/2022 7:15 AM Performed by: CRNA  Patient location: Pre-op. Preanesthetic checklist: patient identified, IV checked, site marked, risks and benefits discussed, surgical consent, monitors and equipment checked, pre-op evaluation, timeout performed and anesthesia consent Lidocaine 1% used for infiltration Right, radial was placed Catheter size: 20 G Hand hygiene performed  and maximum sterile barriers used   Attempts: 2 Procedure performed without using ultrasound guided technique. Following insertion, dressing applied and Biopatch. Post procedure assessment: normal  Patient tolerated the procedure well with no immediate complications.

## 2022-10-04 NOTE — Discharge Summary (Incomplete)
[facility]VE TEAM  Discharge Summary    Patient ID: JAKHARI SPACE MRN: [id number]; DOB: 07-Jan-1946  Admit date: 10/04/2022 Discharge date: 10/06/2022  Primary Care Provider: Marda Stalker, PA-C  Primary Cardiologist: Sherren Mocha, MD  Discharge Diagnoses    Principal Problem:   S/P TAVR (transcatheter aortic valve replacement) Active Problems:   HLD (hyperlipidemia)   Essential hypertension   CAD s/p CABG x5   Insulin dependent type 2 diabetes mellitus (HCC)   PAD (peripheral artery disease) (HCC)   Severe aortic stenosis   S/P placement of cardiac pacemaker   CHB (complete heart block) (HCC)   Allergies Allergies  Allergen Reactions   Empagliflozin Other (See Comments)    Abdominal pain/dizziness    Diagnostic Studies/Procedures    TAVR OPERATIVE NOTE     Date of Procedure:                10/04/2022   Preoperative Diagnosis:      Severe Aortic Stenosis    Postoperative Diagnosis:    Same    Procedure:        Transcatheter Aortic Valve Replacement - Percutaneous Left Transfemoral Approach             Edwards Sapien 3 Ultra THV (size 26 mm, model # 9755RSL, serial # [id number])              Co-Surgeons:                        Coralie Common MD and Sherren Mocha, MD      Anesthesiologist:                  Renold Don MD    Echocardiographer:              Tish Men MD   Pre-operative Echo Findings: Severe aortic stenosis normal left ventricular systolic function   Post-operative Echo Findings: No  paravalvular leak normal left ventricular systolic function _____________    Echo 10/05/22: IMPRESSIONS   1. 26 mm S3. Vmax 1.6 m/s, MG 5.3 mmHG, EOA 2.87 cm2, DI 0.58. No  regurgitation or paravalvular leak. The aortic valve has been  repaired/replaced. Aortic valve regurgitation is not visualized. There is  a 26 mm Sapien prosthetic (TAVR) valve present in  the aortic position. Procedure Date:  10/04/2022. Echo findings are  consistent with normal structure and function of the aortic valve  prosthesis.   2. Apical movement consistent with RV pacing. Left ventricular ejection  fraction, by estimation, is 55 to 60%. The left ventricle has normal  function. The left ventricle has no regional wall motion abnormalities.  There is moderate asymmetric left  ventricular hypertrophy of the basal-septal segment. Left ventricular  diastolic parameters are consistent with Grade I diastolic dysfunction  (impaired relaxation).   3. Right ventricular systolic function is normal. The right ventricular  size is normal. Tricuspid regurgitation signal is inadequate for assessing  PA pressure.   4. The mitral valve is grossly normal. Trivial mitral valve  regurgitation. No evidence of mitral stenosis.   5. The inferior vena cava is normal in size with greater than 50%  respiratory variability, suggesting right atrial pressure of 3 mmHg.   Comparison(s): No significant change from prior study.    ________________________  10/05/22 PACEMAKER IMPLANT   Conclusion  SURGEON:  Will Meredith Leeds, MD     PREPROCEDURE DIAGNOSIS:  complete AV block  POSTPROCEDURE DIAGNOSIS:  complete AV block     PROCEDURES:   1.  Pacemaker implantation.        CONCLUSIONS:   1. Successful implantation of a Medtronic Azure XT DR MRI SureScan dual-chamber pacemaker for symptomatic bradycardia  2. No early apparent complications.   History of Present Illness     [name] [name] is a 76 y.o. male with a history of CAD s/p CABG 2009, and subsequent multivessel cononary stenting and chronic angina due to small vessel CAD, obesity, PAD s/p fem endart 2022, DM2, HTN, HLD, and severe AS who presented to [facility] on 10/04/22 for planned TAVR.   He has undergone CABG and PCI procedures after bypass surgery.  He has had multiple PCI procedures over the past few years.  The patient underwent right common femoral  endarterectomy in 2022 to treat severe common femoral artery stenosis and right leg claudication.  He underwent PCI in November 2022 and June 2023 with treatment of the saphenous vein graft to obtuse marginal for treatment of progressive anginal chest pain. The patient has been followed for moderate aortic stenosis. Surveillance echo on 08/08/22 showed progression from moderate AS to severe AS with severe aortic valve calcification, AVA 0.8 cm2, mean gradient 44.0 mmHg, and peak gradient 75.0 mmHg.  He continued to have anginal chest pain and progressive DOE and despite maximal medical therapy.  The patient has been evaluated by the multidisciplinary valve team and felt to have severe, symptomatic aortic stenosis and to be a suitable candidate for TAVR, which was set up for 10/04/22.   Hospital Course     Consultants: none   Severe AS: s/p successful TAVR with a 26 mm Edwards Sapien 3 Ultra Resilia THV via the TF approach on 10/04/22. Post operative showed EF 55%, normally functioning TAVR with a mean gradient of 5.3 mmHg and no PVL. Groin sites are stable. ECG with paced rhythm. Continue chronic DAPT. Pt walked with cardiac rehab with no issues. Plan for discharge home today with close follow up in the office next week.   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 Now s/p Medtronic Azure XT DR MRI SureScan dual-chamber pacemaker on 10/05/22 by Dr. Curt Bears.  CAD: s/p CABG in 2009 and subsequent multiple PCI in 08/2021 and 03/2022. Per last cath note, "area of disease was distal in the graft and there is difficulty in delivering equipment as well. I do not think an additional stent would be beneficial. Could consider drug-eluting balloon if available in the event that he has further restenosis." Continue medical therapy.   PAD: s/p rt fem endart in 2022. Vascular follows for PAD and carotid stenosis. Continue medical management    DMT2: treated with SSI while admitted. Resume home meds at  discharge. Okay to resume Metformin after 48 hours after contrast dye exposure (12/21 PM)    HTN: mildly elevated. BB on hold. Will resume after PPM   HLD: continue crestor  _____________  Discharge Vitals Blood pressure 134/80, pulse (!) 110, temperature 98.4 F (36.9 C), temperature source Oral, resp. rate 18, height _0  (1.753 m), weight 93.5 kg, SpO2 94 %.  Filed Weights   10/04/22 0542 10/05/22 0500 10/06/22 0600  Weight: 93 kg 93.7 kg 93.5 kg    GEN: Well nourished, well developed, in no acute distress HEENT: normal Neck: no JVD or masses Cardiac: RRR; 2/6 flow murmur. No rubs, or gallops,no edema  Respiratory:  clear to auscultation bilaterally, normal work of breathing GI: soft, nontender,  nondistended, + BS MS: no deformity or atrophy Skin: warm and dry, no rash.  Groin sites clear without hematoma or ecchymosis. Pacermaker site with mild ecchymosis Neuro:  Alert and Oriented x 3, Strength and sensation are intact Psych: euthymic mood, full affect  Labs & Radiologic Studies    CBC Recent Labs    10/05/22 0518 10/06/22 0359  WBC 9.0 8.5  HGB 12.5* 12.7*  HCT 35.7* 37.6*  MCV 86.2 86.8  PLT 169 671   Basic Metabolic Panel Recent Labs    10/05/22 0518 10/06/22 0359  NA 136 134*  K 4.2 4.0  CL 107 103  CO2 20* 21*  GLUCOSE 169* 252*  BUN 14 12  CREATININE 1.17 1.19  CALCIUM 8.4* 9.1  MG 1.8  --    Liver Function Tests No results for input(s): "AST", "ALT", "ALKPHOS", "BILITOT", "PROT", "ALBUMIN" in the last 72 hours. No results for input(s): "LIPASE", "AMYLASE" in the last 72 hours. Cardiac Enzymes No results for input(s): "CKTOTAL", "CKMB", "CKMBINDEX", "TROPONINI" in the last 72 hours. BNP Invalid input(s): "POCBNP" D-Dimer No results for input(s): "DDIMER" in the last 72 hours. Hemoglobin A1C No results for input(s): "HGBA1C" in the last 72 hours. Fasting Lipid Panel No results for input(s): "CHOL", "HDL", "LDLCALC", "TRIG", "CHOLHDL",  "LDLDIRECT" in the last 72 hours. Thyroid Function Tests No results for input(s): "TSH", "T4TOTAL", "T3FREE", "THYROIDAB" in the last 72 hours.  Invalid input(s): "FREET3" _____________  DG Chest 2 View  Result Date: 10/06/2022 CLINICAL DATA:  Pacemaker placement EXAM: CHEST - 2 VIEW COMPARISON:  09/30/2022 FINDINGS: New LEFT subclavian sequential pacemaker with leads at RIGHT atrium and RIGHT ventricle. Normal heart size post CABG and TAVR. Mediastinal contours and pulmonary vascularity normal. Atherosclerotic calcification aorta. Minimal scarring at lingula. Lungs otherwise clear. No infiltrate, pleural effusion, or pneumothorax. Osseous structures unremarkable. IMPRESSION: New LEFT subclavian pacemaker as above. No acute abnormalities. Electronically Signed   By: Lavonia Dana M.D.   On: 10/06/2022 08:36   ECHOCARDIOGRAM COMPLETE  Result Date: 10/05/2022    ECHOCARDIOGRAM REPORT   Patient Name:   JONHATAN HEARTY Date of Exam: 10/05/2022 Medical Rec #:  [id number]     Height:       69.0 in Accession #:    [id number]    Weight:       206.6 lb Date of Birth:  March 25, 1946     BSA:          2.095 m Patient Age:    29 years      BP:           156/55 mmHg Patient Gender: M             HR:           50 bpm. Exam Location:  Inpatient Procedure: 2D Echo, Cardiac Doppler and Color Doppler Indications:    I35.0 Nonrheumatic aortic (valve) stenosis  History:        Patient has prior history of Echocardiogram examinations, most                 recent 10/04/2022. CAD, Abnormal ECG, Aortic Valve Disease,                 Arrythmias:AV Block; Risk Factors:Hypertension, Dyslipidemia and                 Diabetes. Aortic stenosis.                 Aortic Valve: 26 mm Sapien prosthetic,  stented (TAVR) valve is                 present in the aortic position. Procedure Date: 10/04/2022.  Sonographer:    Roseanna Rainbow RDCS Referring Phys: [id number] [place]  Sonographer Comments: Technically difficult study due to poor echo  windows. Image acquisition challenging due to patient body habitus. Patient supine with temp pacer. Could not turn. IMPRESSIONS  1. 26 mm S3. Vmax 1.6 m/s, MG 5.3 mmHG, EOA 2.87 cm2, DI 0.58. No regurgitation or paravalvular leak. The aortic valve has been repaired/replaced. Aortic valve regurgitation is not visualized. There is a 26 mm Sapien prosthetic (TAVR) valve present in the aortic position. Procedure Date: 10/04/2022. Echo findings are consistent with normal structure and function of the aortic valve prosthesis.  2. Apical movement consistent with RV pacing. Left ventricular ejection fraction, by estimation, is 55 to 60%. The left ventricle has normal function. The left ventricle has no regional wall motion abnormalities. There is moderate asymmetric left ventricular hypertrophy of the basal-septal segment. Left ventricular diastolic parameters are consistent with Grade I diastolic dysfunction (impaired relaxation).  3. Right ventricular systolic function is normal. The right ventricular size is normal. Tricuspid regurgitation signal is inadequate for assessing PA pressure.  4. The mitral valve is grossly normal. Trivial mitral valve regurgitation. No evidence of mitral stenosis.  5. The inferior vena cava is normal in size with greater than 50% respiratory variability, suggesting right atrial pressure of 3 mmHg. Comparison(s): No significant change from prior study. FINDINGS  Left Ventricle: Apical movement consistent with RV pacing. Left ventricular ejection fraction, by estimation, is 55 to 60%. The left ventricle has normal function. The left ventricle has no regional wall motion abnormalities. The left ventricular internal cavity size was normal in size. There is moderate asymmetric left ventricular hypertrophy of the basal-septal segment. Abnormal (paradoxical) septal motion, consistent with RV pacemaker. Left ventricular diastolic parameters are consistent with Grade I diastolic dysfunction (impaired  relaxation). Right Ventricle: The right ventricular size is normal. No increase in right ventricular wall thickness. Right ventricular systolic function is normal. Tricuspid regurgitation signal is inadequate for assessing PA pressure. Left Atrium: Left atrial size was normal in size. Right Atrium: Right atrial size was normal in size. Pericardium: There is no evidence of pericardial effusion. Mitral Valve: The mitral valve is grossly normal. Trivial mitral valve regurgitation. No evidence of mitral valve stenosis. Tricuspid Valve: The tricuspid valve is grossly normal. Tricuspid valve regurgitation is trivial. No evidence of tricuspid stenosis. Aortic Valve: 26 mm S3. Vmax 1.6 m/s, MG 5.3 mmHG, EOA 2.87 cm2, DI 0.58. No regurgitation or paravalvular leak. The aortic valve has been repaired/replaced. Aortic valve regurgitation is not visualized. Aortic valve mean gradient measures 5.3 mmHg. Aortic valve peak gradient measures 10.2 mmHg. Aortic valve area, by VTI measures 2.87 cm. There is a 26 mm Sapien prosthetic, stented (TAVR) valve present in the aortic position. Procedure Date: 10/04/2022. Echo findings are consistent with normal structure and function of the aortic valve prosthesis. Pulmonic Valve: The pulmonic valve was grossly normal. Pulmonic valve regurgitation is not visualized. No evidence of pulmonic stenosis. Aorta: The aortic root and ascending aorta are structurally normal, with no evidence of dilitation. Venous: The inferior vena cava is normal in size with greater than 50% respiratory variability, suggesting right atrial pressure of 3 mmHg. IAS/Shunts: The atrial septum is grossly normal. Additional Comments: A device lead is visualized in the right atrium and right ventricle.  LEFT VENTRICLE PLAX  2D LVIDd:         4.70 cm      Diastology LVIDs:         2.90 cm      LV e' medial:    13.90 cm/s LV PW:         1.20 cm      LV E/e' medial:  5.2 LV IVS:        1.50 cm      LV e' lateral:   9.68 cm/s  LVOT diam:     2.50 cm      LV E/e' lateral: 7.5 LV SV:         88 LV SV Index:   42 LVOT Area:     4.91 cm  LV Volumes (MOD) LV vol d, MOD A2C: 64.9 ml LV vol d, MOD A4C: 102.0 ml LV vol s, MOD A2C: 15.5 ml LV vol s, MOD A4C: 34.6 ml LV SV MOD A2C:     49.4 ml LV SV MOD A4C:     102.0 ml LV SV MOD BP:      66.7 ml RIGHT VENTRICLE             IVC RV S prime:     12.90 cm/s  IVC diam: 1.60 cm TAPSE (M-mode): 2.1 cm LEFT ATRIUM             Index        RIGHT ATRIUM           Index LA diam:        4.10 cm 1.96 cm/m   RA Area:     12.50 cm LA Vol (A2C):   37.2 ml 17.76 ml/m  RA Volume:   25.10 ml  11.98 ml/m LA Vol (A4C):   31.6 ml 15.09 ml/m LA Biplane Vol: 34.1 ml 16.28 ml/m  AORTIC VALVE AV Area (Vmax):    2.71 cm AV Area (Vmean):   2.71 cm AV Area (VTI):     2.87 cm AV Vmax:           159.33 cm/s AV Vmean:          104.000 cm/s AV VTI:            0.306 m AV Peak Grad:      10.2 mmHg AV Mean Grad:      5.3 mmHg LVOT Vmax:         87.85 cm/s LVOT Vmean:        57.400 cm/s LVOT VTI:          0.179 m LVOT/AV VTI ratio: 0.58  AORTA Ao Root diam: 3.80 cm Ao Asc diam:  3.05 cm MITRAL VALVE MV Area (PHT): 2.18 cm     SHUNTS MV Decel Time: 349 msec     Systemic VTI:  0.18 m MV E velocity: 72.83 cm/s   Systemic Diam: 2.50 cm MV A velocity: 100.13 cm/s MV E/A ratio:  0.73 Eleonore Chiquito MD Electronically signed by Eleonore Chiquito MD Signature Date/Time: 10/05/2022/7:16:19 PM    Final    EP PPM/ICD IMPLANT  Result Date: 10/05/2022 SURGEON:  Constance Haw, MD   PREPROCEDURE DIAGNOSIS:  complete AV block   POSTPROCEDURE DIAGNOSIS:  complete AV block    PROCEDURES:  1.  Pacemaker implantation.   INTRODUCTION: [name] [name] is a 76 y.o. male  with a history of bradycardia who presents today for pacemaker implantation.  He has complete AV block post TAVR. No Reversible causes  have been identified.  The patient therefore presents today for pacemaker implantation.   DESCRIPTION OF PROCEDURE:  Informed written  consent was obtained, and  the patient was brought to the electrophysiology lab in a fasting state.  The patient required no sedation for the procedure today.  The patients left chest was prepped and draped in the usual sterile fashion by the EP lab staff. The skin overlying the left deltopectoral region was infiltrated with lidocaine for local analgesia.  A 4-cm incision was made over the left deltopectoral region.  A left subcutaneous pacemaker pocket was fashioned using a combination of sharp and blunt dissection. Electrocautery was required to assure hemostasis.  RA/RV Lead Placement: The left axillary vein was cannulated.  Through the left axillary vein, a Medtronic model 5076 (serial number [id number]) right atrial lead and a Medtronic model 3830 (serial number [id number]) right ventricular lead were advanced with fluoroscopic visualization into the right atrial appendage and left bundle area positions respectively.  Initial atrial lead P- waves measured 2.73m with impedance of 976 ohms and a threshold of 0.8 V at 0.5 msec.  Right ventricular lead R-waves measured 12 mV with an impedance of 1308 ohms and a threshold of 0.8 V at 0.5 msec.  Both leads were secured to the pectoralis fascia using #2-0 silk over the suture sleeves. Device Placement:  The leads were then connected to a Medtronic Azure XT DR MRI SureScan (serial number [id number]) pacemaker.  The pocket was irrigated with copious gentamicin solution.  The pacemaker was then placed into the pocket.  The pocket was then closed in 3 layers with 2.0 Vicryl suture for the subcutaneous and 3.0 Vicryl suture subcuticular layers.  Steri-Strips and a sterile dressing were then applied. EBL<122m There were no early apparent complications.   CONCLUSIONS:  1. Successful implantation of a Medtronic Azure XT DR MRI SureScan dual-chamber pacemaker for symptomatic bradycardia  2. No early apparent complications.       Will MaMeredith LeedsMD 10/05/2022 6:19  PM   ECHOCARDIOGRAM LIMITED  Result Date: 10/04/2022    ECHOCARDIOGRAM LIMITED REPORT   Patient Name:   JEZIV WELCHELate of Exam: 10/04/2022 Medical Rec #:  [id number]   Height:       69.0 in Accession #:    [id number]  Weight:       205.0 lb Date of Birth:  1/29-Nov-1945   BSA:          2.088 m Patient Age:    [age]      BP:           178/76 mmHg Patient Gender: M             HR:           82 bpm. Exam Location:  Inpatient Procedure: Limited Echo, Cardiac Doppler and Color Doppler Indications:     I35.0 Nonrheumatic aortic (valve) stenosis  History:         Patient has prior history of Echocardiogram examinations, most                  recent 08/08/2022. CAD and Previous Myocardial Infarction,                  Prior CABG, Aortic Valve Disease, Signs/Symptoms:Chest Pain;                  Risk Factors:Diabetes, Dyslipidemia and Hypertension.  Aortic Valve: 26 mm Sapien prosthetic, stented (TAVR) valve is                  present in the aortic position. Procedure Date: 10/04/2022.  Sonographer:     Roseanna Rainbow RDCS Referring Phys:  [place] Diagnosing Phys: Eleonore Chiquito MD  Sonographer Comments: Technically difficult study due to poor echo windows. TAVR procedure using 35m ES valve. IMPRESSIONS  1. Echo guided TAVR. Vmax 0.9 m/s, MG 2.0 mmHG, EOA 3.9 cm2, DI 0.79. No regurgitation or paravalvular leak. There is a 26 mm Sapien prosthetic (TAVR) valve present in the aortic position. Procedure Date: 10/04/2022.  2. Left ventricular ejection fraction, by estimation, is 60 to 65%. The left ventricle has normal function. There is moderate concentric left ventricular hypertrophy.  3. Right ventricular systolic function is normal. The right ventricular size is normal.  4. The mitral valve is grossly normal. Trivial mitral valve regurgitation. No evidence of mitral stenosis. FINDINGS  Left Ventricle: Left ventricular ejection fraction, by estimation, is 60 to 65%. The left ventricle has  normal function. The left ventricular internal cavity size was normal in size. There is moderate concentric left ventricular hypertrophy. Right Ventricle: The right ventricular size is normal. No increase in right ventricular wall thickness. Right ventricular systolic function is normal. Pericardium: Trivial pericardial effusion is present. The pericardial effusion is posterior to the left ventricle. Mitral Valve: The mitral valve is grossly normal. Trivial mitral valve regurgitation. No evidence of mitral valve stenosis. Tricuspid Valve: The tricuspid valve is grossly normal. Tricuspid valve regurgitation is trivial. No evidence of tricuspid stenosis. Aortic Valve: Echo guided TAVR. Vmax 0.9 m/s, MG 2.0 mmHG, EOA 3.9 cm2, DI 0.79. No regurgitation or paravalvular leak. Aortic valve mean gradient measures 2.0 mmHg. Aortic valve peak gradient measures 3.8 mmHg. Aortic valve area, by VTI measures 3.90 cm. There is a 26 mm Sapien prosthetic, stented (TAVR) valve present in the aortic position. Procedure Date: 10/04/2022. Pulmonic Valve: The pulmonic valve was grossly normal. Pulmonic valve regurgitation is trivial. No evidence of pulmonic stenosis. Aorta: The aortic root and ascending aorta are structurally normal, with no evidence of dilitation. Additional Comments: Spectral Doppler performed. Color Doppler performed.  LEFT VENTRICLE PLAX 2D LVIDd:         4.10 cm LVIDs:         2.40 cm LV PW:         1.40 cm LV IVS:        1.70 cm LVOT diam:     2.50 cm LV SV:         87 LV SV Index:   42 LVOT Area:     4.91 cm  AORTIC VALVE AV Area (Vmax):    4.67 cm AV Area (Vmean):   3.88 cm AV Area (VTI):     3.90 cm AV Vmax:           97.00 cm/s AV Vmean:          68.000 cm/s AV VTI:            0.224 m AV Peak Grad:      3.8 mmHg AV Mean Grad:      2.0 mmHg LVOT Vmax:         92.30 cm/s LVOT Vmean:        53.700 cm/s LVOT VTI:          0.178 m LVOT/AV VTI ratio: 0.79  AORTA Ao Asc diam: 3.60 cm  SHUNTS  Systemic VTI:  0.18 m  Systemic Diam: 2.50 cm Eleonore Chiquito MD Electronically signed by Eleonore Chiquito MD Signature Date/Time: 10/04/2022/1:18:40 PM    Final    Structural Heart Procedure  Result Date: 10/04/2022 See surgical note for result.  DG Chest 2 View  Result Date: 10/02/2022 CLINICAL DATA:  Preoperative examination for TAVR.  Remote CABG. EXAM: CHEST - 2 VIEW COMPARISON:  Radiographs 07/15/2022.  CT 08/26/2022. FINDINGS: The heart size and mediastinal contours are stable post CABG. There is aortic atherosclerosis. The lungs are clear. There is no pleural effusion or pneumothorax. No acute osseous findings are demonstrated. There are mild degenerative changes in the spine. IMPRESSION: Stable chest. No acute cardiopulmonary process. Aortic atherosclerosis. Electronically Signed   By: Richardean Sale M.D.   On: 10/02/2022 11:12   Disposition   Pt is being discharged home today in good condition.  Follow-up Plans & Appointments     Follow-up Information     Eileen Stanford, PA-C. Go on 10/12/2022.   Specialties: Cardiology, Radiology Why: @ 3pm, please arrive at least 10 min early Contact information: [place] [address] [phone]                Discharge Instructions     Amb Referral to Cardiac Rehabilitation   Complete by: As directed    Diagnosis: Valve Replacement   Valve: Tricuspid   After initial evaluation and assessments completed: Virtual Based Care may be provided alone or in conjunction with Phase 2 Cardiac Rehab based on patient barriers.: Yes   Intensive Cardiac Rehabilitation (ICR) [place] location only OR Traditional Cardiac Rehabilitation (TCR) *If criteria for ICR are not met will enroll in TCR [facility] only): Yes       Discharge Medications   Allergies as of 10/06/2022       Reactions   Empagliflozin Other (See Comments)   Abdominal pain/dizziness        Medication List     TAKE these medications    amLODipine 10 MG  tablet Commonly known as: NORVASC Take 10 mg by mouth daily.   aspirin EC 81 MG tablet TAKE 1 TABLET BY MOUTH EVERY DAY   B-D UF III MINI PEN NEEDLES 31G X 5 MM Misc Generic drug: Insulin Pen Needle Use to inject Ozempic once a week   benazepril 40 MG tablet Commonly known as: LOTENSIN Take 40 mg by mouth daily.   clopidogrel 75 MG tablet Commonly known as: PLAVIX TAKE 1 TABLET BY MOUTH EVERY DAY   finasteride 5 MG tablet Commonly known as: PROSCAR Take 5 mg by mouth daily.   hydrochlorothiazide 25 MG tablet Commonly known as: HYDRODIURIL TAKE 1 TABLET BY MOUTH EVERY DAY   isosorbide mononitrate 60 MG 24 hr tablet Commonly known as: IMDUR Take 1.5 tablets (90 mg total) by mouth 2 (two) times daily. What changed: how much to take   metFORMIN 1000 MG tablet Commonly known as: GLUCOPHAGE Take 1,000 mg by mouth 2 (two) times daily with a meal. Notes to patient:  Okay to resume Metformin after 48 hours after contrast dye exposure (12/21 PM)    Metoprolol Tartrate 75 MG Tabs Take 75 mg by mouth 2 (two) times daily. What changed: how much to take   multivitamin with minerals Tabs tablet Take 1 tablet by mouth daily.   nitroGLYCERIN 0.4 MG SL tablet Commonly known as: NITROSTAT DISSOLVE 1 TABLET UNDER THE TONGUE EVERY 5 MINUTES AS NEEDED FOR CHEST PAIN. MAX OF  3 DOSES, THEN 911.   NovoLIN N FlexPen 100 UNIT/ML Kiwkpen Generic drug: Insulin NPH (Human) (Isophane) Inject 15 Units into the skin 2 (two) times daily.   phenazopyridine 100 MG tablet Commonly known as: Pyridium Take 1 tablet (100 mg total) by mouth daily. Urinary urgency   Precision QID Test test strip Generic drug: glucose blood 1 each by Other route as directed.   ranolazine 500 MG 12 hr tablet Commonly known as: RANEXA Take 500 mg by mouth 2 (two) times daily. What changed: Another medication with the same name was removed. Continue taking this medication, and follow the directions you see here.    rosuvastatin 40 MG tablet Commonly known as: CRESTOR Take 1 tablet (40 mg total) by mouth daily. What changed: how much to take            Outstanding Labs/Studies   none  Duration of Discharge Encounter   Greater than 30 minutes including physician time.  Signed, Angelena Form, PA-C Student Nurse Practitioner 10/06/2022, 9:21 AM  Patient seen, examined. Available data reviewed. Agree with findings, assessment, and plan as outlined by Nell Range, PA-C. On exam, he is alert, oriented, in NAD. Lungs CTA, CV: RRR with a 2/6 SEM at the RUSB, abd: soft, NT. Ext: groin sites clear. No edema. Pt medically stable after PPM placement yesterday. Agree with DC meds above. Post-TAVR restrictions reviewed with patient. Home today.  Sherren Mocha, M.D. 10/06/2022 6:42 PM

## 2022-10-04 NOTE — Op Note (Signed)
[facility]   MULTIDISCIPLINARY HEART VALVE TEAM   TAVR OPERATIVE NOTE   Date of Procedure:  10/04/2022  Preoperative Diagnosis: Severe Aortic Stenosis   Postoperative Diagnosis: Same   Procedure:   Transcatheter Aortic Valve Replacement - Percutaneous Left Transfemoral Approach  Edwards Sapien 3 Ultra THV (size 26 mm, model # 9755RSL, serial # [id number])   Co-Surgeons:  Coralie Common MD and Sherren Mocha, MD    Anesthesiologist:  Renold Don MD   Echocardiographer:  Tish Men MD  Pre-operative Echo Findings: Severe aortic stenosis normal left ventricular systolic function  Post-operative Echo Findings: No  paravalvular leak normal left ventricular systolic function   BRIEF CLINICAL NOTE AND INDICATIONS FOR SURGERY    Pt with severe symptomatic AS with NYHA class 3 symptoms and a class I indication for AVR. With his age, previous CABG and PAD, he would best be served with TAVR with a 80m Sapien valve via the left femoral approach.     DETAILS OF THE OPERATIVE PROCEDURE  PREPARATION:    The patient was brought to the operating room on the above mentioned date and appropriate monitoring was established by the anesthesia team. The patient was placed in the supine position on the operating table.  Intravenous antibiotics were administered. The patient was monitored closely throughout the procedure under conscious sedation.   Baseline transthoracic echocardiogram was performed. The patient's abdomen and both groins were prepped and draped in a sterile manner. A time out procedure was performed.   PERIPHERAL ACCESS:    Using the modified Seldinger technique, femoral arterial and venous access was obtained with placement of 6 Fr sheaths on the right side.  A pigtail diagnostic catheter was passed through the right arterial sheath under fluoroscopic guidance into the aortic root.  A temporary transvenous pacemaker catheter was passed through the right  femoral venous sheath under fluoroscopic guidance into the right ventricle.  The pacemaker was tested to ensure stable lead placement and pacemaker capture. Aortic root angiography was performed in order to determine the optimal angiographic angle for valve deployment.   TRANSFEMORAL ACCESS:   Percutaneous transfemoral access and sheath placement was performed using ultrasound guidance.  The left common femoral artery was cannulated using a micropuncture needle and appropriate location was verified using hand injection angiogram.  A pair of Abbott Perclose percutaneous closure devices were placed and a 6 French sheath replaced into the femoral artery.  The patient was heparinized systemically and ACT verified > 250 seconds.    A 14 Fr transfemoral E-sheath was introduced into the left common femoral artery after progressively dilating over an Amplatz superstiff wire. An AL 2  catheter was used to direct a straight-tip exchange length wire across the native aortic valve into the left ventricle. This was exchanged out for a pigtail catheter and position was confirmed in the LV apex. Simultaneous LV and Ao pressures were recorded.LVEDP of 220mg with a mean gradient of 4252m  The pigtail catheter was exchanged for a Safari wire in the LV apex.   BALLOON AORTIC VALVULOPLASTY:   Was not done   TRANSCATHETER HEART VALVE DEPLOYMENT:   An Edwards Sapien 3 Ultra transcatheter heart valve (size 26 mm) was prepared and crimped per manufacturer's guidelines, and the proper orientation of the valve is confirmed on the [employer] system. The valve was advanced through the introducer sheath using normal technique until in an appropriate position in the abdominal aorta beyond the sheath tip. The balloon was then retracted and  using the fine-tuning wheel was centered on the valve. The valve was then advanced across the aortic arch using appropriate flexion of the catheter. The valve was carefully  positioned across the aortic valve annulus. The Commander catheter was retracted using normal technique. Once final position of the valve has been confirmed by angiographic assessment, the valve is deployed during rapid ventricular pacing to maintain systolic blood pressure < 50 mmHg and pulse pressure < 10 mmHg. The balloon inflation is held for >3 seconds after reaching full deployment volume. Once the balloon has fully deflated the balloon is retracted into the ascending aorta and valve function is assessed using echocardiography. There is felt to be no paravalvular leak and no central aortic insufficiency.  The patient's hemodynamic recovery following valve deployment is good. Patient experienced heart block The deployment balloon and guidewire are both removed.    PROCEDURE COMPLETION:   The sheath was removed and femoral artery closure performed.  Protamine was administered once femoral arterial repair was complete. The temporary pacemaker was secured in place for transport to unit for ongoing temporary pacing back up. The  pigtail catheter and femoral sheath were removed with manual pressure used for venous hemostasis.    The patient tolerated the procedure well and is transported to the cath lab recovery area in stable condition.  All sponge instrument and needle counts are verified correct at completion of the operation.   No blood products were administered during the operation.  The patient received a total of 43 mL of intravenous contrast during the procedure.   Coralie Common, MD 10/04/2022 9:43 AM

## 2022-10-04 NOTE — OR Nursing (Signed)
Pacer Placement at 68 cm, right groin

## 2022-10-04 NOTE — Progress Notes (Signed)
  Echocardiogram 2D Echocardiogram has been performed.  [name] [name] 10/04/2022, 10:01 AM

## 2022-10-04 NOTE — Transfer of Care (Signed)
Immediate Anesthesia Transfer of Care Note  Patient: [name] [name]  Procedure(s) Performed: Transcatheter Aortic Valve Replacement, Transfemoral (Left: Chest) INTRAOPERATIVE TRANSTHORACIC ECHOCARDIOGRAM  Patient Location: Cath Lab  Anesthesia Type:MAC  Level of Consciousness: awake, alert , and oriented  Airway & Oxygen Therapy: Patient Spontanous Breathing and Patient connected to nasal cannula oxygen  Post-op Assessment: Report given to RN and Post -op Vital signs reviewed and stable  Post vital signs: Reviewed and stable  Last Vitals:  Vitals Value Taken Time  BP 102/51 10/04/22 1000  Temp 36.7 C 10/04/22 0958  Pulse 60 10/04/22 1003  Resp 19 10/04/22 1003  SpO2 94 % 10/04/22 1003  Vitals shown include unvalidated device data.  Last Pain:  Vitals:   10/04/22 0958  TempSrc:   PainSc: 0-No pain         Complications: No notable events documented.

## 2022-10-04 NOTE — Op Note (Signed)
[facility]   MULTIDISCIPLINARY HEART VALVE TEAM   TAVR OPERATIVE NOTE   Date of Procedure:  10/04/2022  Preoperative Diagnosis: Severe Aortic Stenosis   Postoperative Diagnosis: Same   Procedure:   Transcatheter Aortic Valve Replacement - Percutaneous  Transfemoral Approach  Edwards Sapien 3 Ultra Resilia THV (size 26 mm, serial # [id number])   Co-Surgeons:  Coralie Common, MD and Sherren Mocha, MD  Anesthesiologist:  Renold Don, MD  Echocardiographer:  Marry Guan, MD  Pre-operative Echo Findings: Severe aortic stenosis Normal left ventricular systolic function  Post-operative Echo Findings: No paravalvular leak Normal/unchanged left ventricular systolic function  BRIEF CLINICAL NOTE AND INDICATIONS FOR SURGERY  76 yo male who I have followed for many years with prior CABG, multiple PCI procedures since CABG, chronic angina requiring frequent use of NTG, who has developed progressive angina and dyspnea in the setting of worsening and now severe Stage D1 aortic stenosis.   During the course of the patient's preoperative work up they have been evaluated comprehensively by a multidisciplinary team of specialists coordinated through the [facility] in the [facility].  They have been demonstrated to suffer from symptomatic severe aortic stenosis as noted above. The patient has been counseled extensively as to the relative risks and benefits of all options for the treatment of severe aortic stenosis including long term medical therapy, conventional surgery for aortic valve replacement, and transcatheter aortic valve replacement.  The patient has been independently evaluated in formal cardiac surgical consultation by Dr Lavonna Monarch, who deemed the patient appropriate for TAVR. Based upon review of all of the patient's preoperative diagnostic tests they are felt to be candidate for transcatheter aortic valve replacement using  the transfemoral approach as an alternative to conventional surgery.    Following the decision to proceed with transcatheter aortic valve replacement, a discussion has been held regarding what types of management strategies would be attempted intraoperatively in the event of life-threatening complications, including whether or not the patient would be considered a candidate for the use of cardiopulmonary bypass and/or conversion to open sternotomy for attempted surgical intervention.  The patient has been advised of a variety of complications that might develop peculiar to this approach including but not limited to risks of death, stroke, paravalvular leak, aortic dissection or other major vascular complications, aortic annulus rupture, device embolization, cardiac rupture or perforation, acute myocardial infarction, arrhythmia, heart block or bradycardia requiring permanent pacemaker placement, congestive heart failure, respiratory failure, renal failure, pneumonia, infection, other late complications related to structural valve deterioration or migration, or other complications that might ultimately cause a temporary or permanent loss of functional independence or other long term morbidity.  The patient provides full informed consent for the procedure as described and all questions were answered preoperatively.  DETAILS OF THE OPERATIVE PROCEDURE  PREPARATION:   The patient is brought to the operating room on the above mentioned date and central monitoring was established by the anesthesia team including placement of a radial arterial line. The patient is placed in the supine position on the operating table.  Intravenous antibiotics are administered. The patient is monitored closely throughout the procedure under conscious sedation.    Baseline transthoracic echocardiogram is performed. The patient's chest, abdomen, both groins, and both lower extremities are prepared and draped in a sterile manner. A  time out procedure is performed.   PERIPHERAL ACCESS:   Using ultrasound guidance, femoral arterial and venous access is obtained with placement of 6  Fr sheaths on the right side.  [place] images are digitally captured and stored in the patient's chart. A pigtail diagnostic catheter was passed through the femoral arterial sheath under fluoroscopic guidance into the aortic root.  A temporary transvenous pacemaker catheter was passed through the femoral venous sheath under fluoroscopic guidance into the right ventricle.  The pacemaker was tested to ensure stable lead placement and pacemaker capture. Aortic root angiography was performed in order to determine the optimal angiographic angle for valve deployment.  TRANSFEMORAL ACCESS:  A micropuncture technique is used to access the left femoral artery under fluoroscopic and ultrasound guidance.  2 Perclose devices are deployed at 10' and 2' positions to 'PreClose' the femoral artery. An 8 French sheath is placed and then an Amplatz Superstiff wire is advanced through the sheath. This is changed out for a 14 French transfemoral E-Sheath after progressively dilating over the Superstiff wire.  An AL-2 catheter was used to direct a straight-tip exchange length wire across the native aortic valve into the left ventricle. This was exchanged out for a pigtail catheter and position was confirmed in the LV apex. Simultaneous LV and Ao pressures were recorded.  The pigtail catheter was exchanged for an Amplatz Extra-stiff wire in the LV apex.    BALLOON AORTIC VALVULOPLASTY:  Not performed  TRANSCATHETER HEART VALVE DEPLOYMENT:  An Edwards Sapien 3 transcatheter heart valve (size 26 mm) was prepared and crimped per manufacturer's guidelines, and the proper orientation of the valve is confirmed on the [employer] delivery system. The valve was advanced through the introducer sheath using normal technique until in an appropriate position in the abdominal aorta beyond  the sheath tip. The balloon was then retracted and using the fine-tuning wheel was centered on the valve. The valve was then advanced across the aortic arch using appropriate flexion of the catheter. The valve was carefully positioned across the aortic valve annulus. The Commander catheter was retracted using normal technique. Once final position of the valve has been confirmed by angiographic assessment, the valve is deployed while temporarily holding ventilation and during rapid ventricular pacing to maintain systolic blood pressure < 50 mmHg and pulse pressure < 10 mmHg. The balloon inflation is held for >3 seconds after reaching full deployment volume. Once the balloon has fully deflated the balloon is retracted into the ascending aorta and valve function is assessed using echocardiography. The patient's hemodynamic recovery following valve deployment is good.  However, the patient developed complete AV block and required temporary transvenous pacing. The deployment balloon and guidewire are both removed. Echo demostrated acceptable post-procedural gradients, stable mitral valve function, and no aortic insufficiency.    PROCEDURE COMPLETION:  The sheath was removed and femoral artery closure is performed using the 2 previously deployed Perclose devices.  Protamine is administered once femoral arterial repair was complete. The site is clear with no evidence of bleeding or hematoma after the sutures are tightened. The temporary pacemaker is left in place due to the presence of complete AV block. The pigtail catheters are removed. Manual pressure is used for contralateral femoral arterial hemostasis for the 6 Fr sheath.  The patient tolerated the procedure well and is transported to the recovery area in stable condition. There were no immediate intraoperative complications. All sponge instrument and needle counts are verified correct at completion of the operation.   The patient received a total of 43 mL of  intravenous contrast during the procedure.  Conclusion: Successful transfemoral TAVR with a 26 mm Sapien 3 Ultra  Resilia valve with mean post-op gradient 2 mmHg and no PVL. Procedure complicated by complete AV block - temporary pacemaker secured in place with pacing threshold less than 0.6 mA. Will place EP consultation.  Sherren Mocha, MD 10/04/2022 3:06 PM

## 2022-10-05 ENCOUNTER — Inpatient Hospital Stay (HOSPITAL_COMMUNITY): Payer: Medicare Other

## 2022-10-05 ENCOUNTER — Encounter (HOSPITAL_COMMUNITY): Payer: Self-pay | Admitting: Cardiovascular Disease

## 2022-10-05 ENCOUNTER — Inpatient Hospital Stay (HOSPITAL_COMMUNITY)
Admission: RE | Disposition: A | Discharge status: Home / Self Care | Payer: Self-pay | Source: Home / Self Care | Attending: Cardiovascular Disease

## 2022-10-05 ENCOUNTER — Other Ambulatory Visit: Payer: Self-pay

## 2022-10-05 DIAGNOSIS — I350 Nonrheumatic aortic (valve) stenosis: Secondary | ICD-10-CM | POA: Diagnosis not present

## 2022-10-05 DIAGNOSIS — Z006 Encounter for examination for normal comparison and control in clinical research program: Secondary | ICD-10-CM | POA: Diagnosis not present

## 2022-10-05 DIAGNOSIS — Z952 Presence of prosthetic heart valve: Secondary | ICD-10-CM | POA: Diagnosis not present

## 2022-10-05 DIAGNOSIS — I442 Atrioventricular block, complete: Secondary | ICD-10-CM

## 2022-10-05 DIAGNOSIS — I25110 Atherosclerotic heart disease of native coronary artery with unstable angina pectoris: Secondary | ICD-10-CM | POA: Diagnosis not present

## 2022-10-05 HISTORY — PX: PACEMAKER IMPLANT: EP1218

## 2022-10-05 LAB — POCT I-STAT, CHEM 8
BUN: 15 mg/dL (ref 8–23)
BUN: 15 mg/dL (ref 8–23)
BUN: 14 mg/dL (ref 8–23)
Calcium, Ion: 1.22 mmol/L (ref 1.15–1.40)
Calcium, Ion: 1.26 mmol/L (ref 1.15–1.40)
Calcium, Ion: 1.24 mmol/L (ref 1.15–1.40)
Chloride: 105 mmol/L (ref 98–111)
Chloride: 104 mmol/L (ref 98–111)
Chloride: 106 mmol/L (ref 98–111)
Creatinine, Ser: 1 mg/dL (ref 0.61–1.24)
Creatinine, Ser: 1.1 mg/dL (ref 0.61–1.24)
Creatinine, Ser: 1.1 mg/dL (ref 0.61–1.24)
Glucose, Bld: 212 mg/dL — ABNORMAL HIGH (ref 70–99)
Glucose, Bld: 207 mg/dL — ABNORMAL HIGH (ref 70–99)
Glucose, Bld: 205 mg/dL — ABNORMAL HIGH (ref 70–99)
HCT: 38 % — ABNORMAL LOW (ref 39.0–52.0)
HCT: 36 % — ABNORMAL LOW (ref 39.0–52.0)
HCT: 34 % — ABNORMAL LOW (ref 39.0–52.0)
Hemoglobin: 12.9 g/dL — ABNORMAL LOW (ref 13.0–17.0)
Hemoglobin: 12.2 g/dL — ABNORMAL LOW (ref 13.0–17.0)
Hemoglobin: 11.6 g/dL — ABNORMAL LOW (ref 13.0–17.0)
Potassium: 4.3 mmol/L (ref 3.5–5.1)
Potassium: 4.5 mmol/L (ref 3.5–5.1)
Potassium: 4.4 mmol/L (ref 3.5–5.1)
Sodium: 139 mmol/L (ref 135–145)
Sodium: 138 mmol/L (ref 135–145)
Sodium: 138 mmol/L (ref 135–145)
TCO2: 23 mmol/L (ref 22–32)
TCO2: 22 mmol/L (ref 22–32)
TCO2: 22 mmol/L (ref 22–32)

## 2022-10-05 LAB — BASIC METABOLIC PANEL
Anion gap: 9 (ref 5–15)
BUN: 14 mg/dL (ref 8–23)
CO2: 20 mmol/L — ABNORMAL LOW (ref 22–32)
Calcium: 8.4 mg/dL — ABNORMAL LOW (ref 8.9–10.3)
Chloride: 107 mmol/L (ref 98–111)
Creatinine, Ser: 1.17 mg/dL (ref 0.61–1.24)
GFR, Estimated: >60 mL/min (ref >60)
Glucose, Bld: 169 mg/dL — ABNORMAL HIGH (ref 70–99)
Potassium: 4.2 mmol/L (ref 3.5–5.1)
Sodium: 136 mmol/L (ref 135–145)

## 2022-10-05 LAB — GLUCOSE, CAPILLARY
Glucose-Capillary: 186 mg/dL — ABNORMAL HIGH (ref 70–99)
Glucose-Capillary: 191 mg/dL — ABNORMAL HIGH (ref 70–99)
Glucose-Capillary: 146 mg/dL — ABNORMAL HIGH (ref 70–99)
Glucose-Capillary: 266 mg/dL — ABNORMAL HIGH (ref 70–99)

## 2022-10-05 LAB — ECHOCARDIOGRAM COMPLETE
AR max vel: 2.71 cm2
AV Area VTI: 2.87 cm2
AV Area mean vel: 2.71 cm2
AV Mean grad: 5.3 mmHg
AV Peak grad: 10.2 mmHg
Ao pk vel: 1.59 m/s
Area-P 1/2: 2.18 cm2
Calc EF: 73.1 %
Height: 69 in
S' Lateral: 2.9 cm
Single Plane A2C EF: 76.1 %
Single Plane A4C EF: 66.1 %
Weight: 3305.14 oz

## 2022-10-05 LAB — CBC
HCT: 35.7 % — ABNORMAL LOW (ref 39.0–52.0)
Hemoglobin: 12.5 g/dL — ABNORMAL LOW (ref 13.0–17.0)
MCH: 30.2 pg (ref 26.0–34.0)
MCHC: 35 g/dL (ref 30.0–36.0)
MCV: 86.2 fL (ref 80.0–100.0)
Platelets: 169 10*3/uL (ref 150–400)
RBC: 4.14 MIL/uL — ABNORMAL LOW (ref 4.22–5.81)
RDW: 13 % (ref 11.5–15.5)
WBC: 9 10*3/uL (ref 4.0–10.5)
nRBC: 0 % (ref 0.0–0.2)

## 2022-10-05 LAB — POCT I-STAT 7, (LYTES, BLD GAS, ICA,H+H)
Acid-base deficit: 4 mmol/L — ABNORMAL HIGH (ref 0.0–2.0)
Bicarbonate: 21 mmol/L (ref 20.0–28.0)
Calcium, Ion: 1.27 mmol/L (ref 1.15–1.40)
HCT: 39 % (ref 39.0–52.0)
Hemoglobin: 13.3 g/dL (ref 13.0–17.0)
O2 Saturation: 99 %
Potassium: 4.3 mmol/L (ref 3.5–5.1)
Sodium: 138 mmol/L (ref 135–145)
TCO2: 22 mmol/L (ref 22–32)
pCO2 arterial: 38.2 mmHg (ref 32–48)
pH, Arterial: 7.349 — ABNORMAL LOW (ref 7.35–7.45)
pO2, Arterial: 160 mmHg — ABNORMAL HIGH (ref 83–108)

## 2022-10-05 LAB — SURGICAL PCR SCREEN
MRSA, PCR: NEGATIVE
Staphylococcus aureus: POSITIVE — AB

## 2022-10-05 LAB — MAGNESIUM: Magnesium: 1.8 mg/dL (ref 1.7–2.4)

## 2022-10-05 SURGERY — PACEMAKER IMPLANT

## 2022-10-05 MED ORDER — MIDAZOLAM HCL 5 MG/5ML IJ SOLN
INTRAMUSCULAR | Status: AC
  Filled 2022-10-05: qty 5

## 2022-10-05 MED ORDER — CEFAZOLIN SODIUM-DEXTROSE 2-4 GM/100ML-% IV SOLN
INTRAVENOUS | Status: AC
  Filled 2022-10-05: qty 100

## 2022-10-05 MED ORDER — SODIUM CHLORIDE 0.9 % IV SOLN: INTRAVENOUS | Status: DC

## 2022-10-05 MED ORDER — SODIUM CHLORIDE 0.9 % IV SOLN
80.0000 mg | INTRAVENOUS | Status: AC
  Administered 2022-10-05: 80 mg
  Filled 2022-10-05: qty 2

## 2022-10-05 MED ORDER — SODIUM CHLORIDE 0.9% FLUSH: 3.0000 mL | INTRAVENOUS | Status: DC | PRN

## 2022-10-05 MED ORDER — CEFAZOLIN SODIUM-DEXTROSE 1-4 GM/50ML-% IV SOLN
1.0000 g | Freq: Four times a day (QID) | INTRAVENOUS | Status: AC
  Administered 2022-10-05 – 2022-10-06 (×3): 1 g via INTRAVENOUS
  Filled 2022-10-05 (×3): qty 50

## 2022-10-05 MED ORDER — SODIUM CHLORIDE 0.9% FLUSH
3.0000 mL | Freq: Two times a day (BID) | INTRAVENOUS | Status: DC
  Administered 2022-10-05 – 2022-10-06 (×3): 3 mL via INTRAVENOUS

## 2022-10-05 MED ORDER — CHLORHEXIDINE GLUCONATE 4 % EX LIQD: 60.0000 mL | Freq: Once | CUTANEOUS | Status: DC

## 2022-10-05 MED ORDER — ONDANSETRON HCL 4 MG/2ML IJ SOLN: 4.0000 mg | Freq: Four times a day (QID) | INTRAMUSCULAR | Status: DC | PRN

## 2022-10-05 MED ORDER — SODIUM CHLORIDE 0.9 % IV SOLN
INTRAVENOUS | Status: AC
  Filled 2022-10-05: qty 2

## 2022-10-05 MED ORDER — LIDOCAINE HCL (PF) 1 % IJ SOLN
INTRAMUSCULAR | Status: DC | PRN
  Administered 2022-10-05: 60 mL

## 2022-10-05 MED ORDER — FENTANYL CITRATE (PF) 100 MCG/2ML IJ SOLN
INTRAMUSCULAR | Status: AC
  Filled 2022-10-05: qty 2

## 2022-10-05 MED ORDER — MIDAZOLAM HCL 5 MG/5ML IJ SOLN
INTRAMUSCULAR | Status: DC | PRN
  Administered 2022-10-05 (×2): 1 mg via INTRAVENOUS

## 2022-10-05 MED ORDER — ACETAMINOPHEN 325 MG PO TABS: 325.0000 mg | ORAL_TABLET | ORAL | Status: DC | PRN

## 2022-10-05 MED ORDER — FENTANYL CITRATE (PF) 100 MCG/2ML IJ SOLN
INTRAMUSCULAR | Status: DC | PRN
  Administered 2022-10-05 (×2): 25 ug via INTRAVENOUS

## 2022-10-05 MED ORDER — CEFAZOLIN SODIUM-DEXTROSE 2-4 GM/100ML-% IV SOLN
2.0000 g | INTRAVENOUS | Status: AC
  Administered 2022-10-05: 2 g via INTRAVENOUS

## 2022-10-05 MED ORDER — HEPARIN (PORCINE) IN NACL 1000-0.9 UT/500ML-% IV SOLN
INTRAVENOUS | Status: AC
  Filled 2022-10-05: qty 500

## 2022-10-05 MED ORDER — CHLORHEXIDINE GLUCONATE 4 % EX LIQD
60.0000 mL | Freq: Once | CUTANEOUS | Status: AC
  Administered 2022-10-05: 4 via TOPICAL
  Filled 2022-10-05: qty 30

## 2022-10-05 MED ORDER — HEPARIN (PORCINE) IN NACL 1000-0.9 UT/500ML-% IV SOLN
INTRAVENOUS | Status: DC | PRN
  Administered 2022-10-05: 500 mL

## 2022-10-05 MED ORDER — CHLORHEXIDINE GLUCONATE CLOTH 2 % EX PADS
6.0000 | MEDICATED_PAD | Freq: Every day | CUTANEOUS | Status: DC
  Administered 2022-10-05 – 2022-10-06 (×2): 6 via TOPICAL

## 2022-10-05 MED ORDER — SODIUM CHLORIDE 0.9 % IV SOLN: 250.0000 mL | INTRAVENOUS | Status: DC

## 2022-10-05 MED ORDER — LIDOCAINE HCL 1 % IJ SOLN
INTRAMUSCULAR | Status: AC
  Filled 2022-10-05: qty 60

## 2022-10-05 SURGICAL SUPPLY — 13 items
CABLE SURGICAL S-101-97-12 (CABLE) ×1 IMPLANT
CATH RIGHTSITE C315HIS02 (CATHETERS) IMPLANT
IPG PACE AZUR XT DR MRI W1DR01 (Pacemaker) IMPLANT
LEAD CAPSURE NOVUS 5076-52CM (Lead) IMPLANT
LEAD SELECT SECURE 3830 383069 (Lead) IMPLANT
PACE AZURE XT DR MRI W1DR01 (Pacemaker) ×1 IMPLANT
PAD DEFIB RADIO PHYSIO CONN (PAD) ×1 IMPLANT
SELECT SECURE 3830 383069 (Lead) ×1 IMPLANT
SHEATH 7FR PRELUDE SNAP 13 (SHEATH) IMPLANT
SHEATH PROBE COVER 6X72 (BAG) IMPLANT
SLITTER UNIVERSAL DS2A003 (MISCELLANEOUS) IMPLANT
TRAY PACEMAKER INSERTION (PACKS) ×1 IMPLANT
WIRE HI TORQ VERSACORE-J 145CM (WIRE) IMPLANT

## 2022-10-05 NOTE — Consult Note (Addendum)
Cardiology Consultation   Patient ID: [name] [name] MRN: [id number]; DOB: [birth year]  Admit date: 10/04/2022 Date of Consult: 10/05/2022  PCP:  Marda Stalker, [facility] Cardiologist:  Sherren Mocha, MD   {    Patient Profile:   [name] [name] is a 76 y.o. male with a hx of CAD (prior CABG/PCIs post CABG, most recently PCI to SVG to OM June 2023), PVD (R CFA endarterectomy), HTN, HLD, DM, VHD w/severe AS who is being seen 10/05/2022 for the evaluation of post TAVR CHB at the request of Dr. [name].  History of Present Illness:   [name] [name] follows closely with Dr. [name] and his team, with ongoing and progressive symptoms edspite multiple PCIs (and felt ot have received the maximal benefit of PCI procedures and antianginal medications), planned to pursue TAVR for his AS.  He was brought yesterday and underwent TAVR with Dr. [name], developed CHB with deployment of the valve requiring pacing via the temp wire.  26 mm Sapien 3 Ultra Resilia valve   POST op day #1 this AM he remains in CHB and EP is asked to see him.  Back up pacing is off and he remains in CHB 50's (No evidence of conduction system disease pre-TAVR)  Limited echo yesterday post TAVR LVEF 60-65% No perivalvular leak  LABS K+ 4.2 Mag 1.8 BUN/Creat 14/1.17 WBC 9.0 H/H 12.5/35.7 Plts 169  Home meds include lopressor 14m BID, none here  Back is bothering him in bed, otherwise no complaints. 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 CBurt Knack& Dr. BCyndia Bent  Severe aortic stenosis     Past Surgical History:  Procedure Laterality Date   ABDOMINAL AORTOGRAM W/LOWER EXTREMITY N/A 12/30/2020   Procedure: ABDOMINAL AORTOGRAM W/LOWER EXTREMITY;  Surgeon: AWellington Hampshire MD;  [facility];  Service: Cardiovascular;  Laterality: N/A;   ADENOIDECTOMY     CARDIAC CATHETERIZATION N/A 07/18/2016   Procedure: Left Heart Cath and Cors/Grafts Angiography;  Surgeon: CBurnell Blanks MD;  [facility];  Service: Cardiovascular;  Laterality: N/A;   CARDIAC CATHETERIZATION N/A 07/18/2016   Procedure: Coronary Stent Intervention;  Surgeon: CBurnell Blanks MD;  [facility];  Service: Cardiovascular;  Laterality: N/A;   CHOLECYSTECTOMY     CORONARY ANGIOPLASTY WITH STENT PLACEMENT     CORONARY ARTERY BYPASS GRAFT     CORONARY BALLOON ANGIOPLASTY N/A 04/01/2022   Procedure: CORONARY BALLOON ANGIOPLASTY;  Surgeon: VJettie Booze MD;  [facility];  Service: Cardiovascular;  Laterality: N/A;  SVG-OM1   CORONARY BALLOON ANGIOPLASTY N/A 09/10/2021   Procedure: CORONARY BALLOON ANGIOPLASTY;  Surgeon: CSherren Mocha MD;  [facility];  Service: Cardiovascular;  Laterality: N/A;   CORONARY STENT INTERVENTION N/A 01/11/2019   Procedure: CORONARY STENT INTERVENTION;  Surgeon: CSherren Mocha MD;  [facility];  Service: Cardiovascular;  Laterality:  N/A;   ENDARTERECTOMY FEMORAL Right 01/13/2021   Procedure: RIGHT FEMORAL ENDARTERECTOMY  WITH PATCH ANGIOPLASTY;  Surgeon: Rosetta Posner, MD;  Location: [place];  Service: Vascular;  Laterality: Right;   INGUINAL HERNIA REPAIR Right    INTRAOPERATIVE TRANSTHORACIC ECHOCARDIOGRAM N/A 10/04/2022   Procedure: INTRAOPERATIVE TRANSTHORACIC ECHOCARDIOGRAM;  Surgeon: Sherren Mocha, MD;  Location: [facility];  Service: Open Heart Surgery;  Laterality: N/A;   LEFT HEART CATH AND  CORS/GRAFTS ANGIOGRAPHY N/A 01/11/2019   Procedure: LEFT HEART CATH AND CORS/GRAFTS ANGIOGRAPHY;  Surgeon: Sherren Mocha, MD;  [facility];  Service: Cardiovascular;  Laterality: N/A;   LEFT HEART CATH AND CORS/GRAFTS ANGIOGRAPHY N/A 04/01/2022   Procedure: LEFT HEART CATH AND CORS/GRAFTS ANGIOGRAPHY;  Surgeon: Jettie Booze, MD;  Location: [facility];  Service: Cardiovascular;  Laterality: N/A;   LEFT HEART CATH AND CORS/GRAFTS ANGIOGRAPHY N/A 09/10/2021   Procedure: LEFT HEART CATH AND CORS/GRAFTS ANGIOGRAPHY;  Surgeon: Sherren Mocha, MD;  Location: [facility];  Service: Cardiovascular;  Laterality: N/A;   Right femoral endarterectomy and Dacron patch angioplasty  01/13/3021   TONSILLECTOMY     TRANSCATHETER AORTIC VALVE REPLACEMENT, TRANSFEMORAL Left 10/04/2022   Procedure: Transcatheter Aortic Valve Replacement, Transfemoral;  Surgeon: Sherren Mocha, MD;  Location: [facility];  Service: Open Heart Surgery;  Laterality: Left;     Home Medications:  Prior to Admission medications   Medication Sig Start Date End Date Taking? Authorizing Provider  amLODipine (NORVASC) 10 MG tablet Take 10 mg by mouth daily.   Yes [provider]  aspirin 81 MG EC tablet TAKE 1 TABLET BY MOUTH EVERY DAY 02/23/21  Yes Sherren Mocha, MD  benazepril (LOTENSIN) 40 MG tablet Take 40 mg by mouth daily.  04/30/12  Yes [provider]  clopidogrel (PLAVIX) 75 MG tablet TAKE 1 TABLET BY MOUTH EVERY DAY 01/17/22  Yes Sherren Mocha, MD  finasteride (PROSCAR) 5 MG tablet Take 5 mg by mouth daily. 03/06/22  Yes [provider]  hydrochlorothiazide (HYDRODIURIL) 25 MG tablet TAKE 1 TABLET BY MOUTH EVERY DAY 09/24/21  Yes Sherren Mocha, MD  isosorbide mononitrate (IMDUR) 60 MG 24 hr tablet Take 1.5 tablets (90 mg total) by mouth 2 (two) times daily. Patient taking differently: Take 120 mg by mouth 2 (two) times daily. 07/16/22  Yes Sande Rives E, PA-C   metFORMIN (GLUCOPHAGE) 1000 MG tablet Take 1,000 mg by mouth 2 (two) times daily with a meal. 05/21/19  Yes [provider]  metoprolol tartrate 75 MG TABS Take 75 mg by mouth 2 (two) times daily. Patient taking differently: Take 50 mg by mouth 2 (two) times daily. 07/16/22  Yes Sande Rives E, PA-C  Multiple Vitamin (MULTIVITAMIN WITH MINERALS) TABS tablet Take 1 tablet by mouth daily.   Yes [provider]  nitroGLYCERIN (NITROSTAT) 0.4 MG SL tablet DISSOLVE 1 TABLET UNDER THE TONGUE EVERY 5 MINUTES AS NEEDED FOR CHEST PAIN. MAX OF 3 DOSES, THEN 911. 07/29/22  Yes Sherren Mocha, MD  NOVOLIN N FLEXPEN 100 UNIT/ML FlexPen Inject 15 Units into the skin 2 (two) times daily. 09/26/22  Yes [provider]  ranolazine (RANEXA) 500 MG 12 hr tablet Take 500 mg by mouth 2 (two) times daily. 09/23/22  Yes [provider]  rosuvastatin (CRESTOR) 40 MG tablet Take 1 tablet (40 mg total) by mouth daily. Patient taking differently: Take 20 mg by mouth daily. 08/31/22  Yes Finis Bud, NP  glucose blood (PRECISION QID TEST) test strip  1 each by Other route as directed. 08/03/20   [provider]  Insulin Pen Needle (B-D UF III MINI PEN NEEDLES) 31G X 5 MM MISC Use to inject Ozempic once a week 01/02/21   [provider]  phenazopyridine (PYRIDIUM) 100 MG tablet Take 1 tablet (100 mg total) by mouth daily. Urinary urgency Patient not taking: Reported on 09/29/2022 07/16/22   Darreld Mclean, PA-C  ranolazine (RANEXA) 1000 MG SR tablet Take 1 tablet (1,000 mg total) by mouth 2 (two) times daily. Patient not taking: Reported on 09/29/2022 04/25/22 04/26/23  Isaiah Serge, NP    Inpatient Medications: Scheduled Meds:  amLODipine  10 mg Oral Daily   aspirin EC  81 mg Oral Daily   benazepril  40 mg Oral Daily   Chlorhexidine Gluconate Cloth  6 each Topical Daily   finasteride  5 mg Oral Daily   hydrochlorothiazide  25 mg Oral Daily   insulin aspart   0-24 Units Subcutaneous TID AC & HS   isosorbide mononitrate  90 mg Oral BID   ranolazine  500 mg Oral BID   rosuvastatin  40 mg Oral Daily   sodium chloride flush  3 mL Intravenous Q12H   Continuous Infusions:  sodium chloride 10 mL/hr at 10/05/22 0800   nitroGLYCERIN     PRN Meds: sodium chloride, acetaminophen **OR** acetaminophen, morphine injection, ondansetron (ZOFRAN) IV, oxyCODONE, sodium chloride flush, traMADol  Allergies:    Allergies  Allergen Reactions   Empagliflozin Other (See Comments)    Abdominal pain/dizziness    Social History:   Social History   Socioeconomic History   Marital status: Married    Spouse name: Not on file   Number of children: Not on file   Years of education: Not on file   Highest education level: Not on file  Occupational History   Not on file  Tobacco Use   Smoking status: Never   Smokeless tobacco: Never  Vaping Use   Vaping Use: Never used  Substance and Sexual Activity   Alcohol use: No   Drug use: No   Sexual activity: Not on file  Other Topics Concern   Not on file  Social History Narrative   Not on file   Social Determinants of Health   Financial Resource Strain: Not on file  Food Insecurity: No Food Insecurity (10/04/2022)   Hunger Vital Sign    Worried About Running Out of Food in the Last Year: Never true    Ran Out of Food in the Last Year: Never true  Transportation Needs: No Transportation Needs (10/04/2022)   PRAPARE - Hydrologist (Medical): No    Lack of Transportation (Non-Medical): No  Physical Activity: Not on file  Stress: Not on file  Social Connections: Not on file  Intimate Partner Violence: Not At Risk (10/04/2022)   Humiliation, Afraid, Rape, and Kick questionnaire    Fear of Current or Ex-Partner: No    Emotionally Abused: No    Physically Abused: No    Sexually Abused: No    Family History:   Family History  Problem Relation Age of Onset   Heart disease  Mother        No details   Diabetes Neg Hx      ROS:  Please see the history of present illness.  All other ROS reviewed and negative.     Physical Exam/Data:   Vitals:   10/05/22 0600 10/05/22 0635 10/05/22 0700 10/05/22  0800  BP: (!) 136/51  (!) 131/52 (!) 156/55  Pulse: (!) 52  (!) 51 (!) 50  Resp: (!) 28  (!) 22 19  Temp:  98.6 F (37 C)    TempSrc:  Oral    SpO2: 93%  96% 96%  Weight:      Height:        Intake/Output Summary (Last 24 hours) at 10/05/2022 1000 Last data filed at 10/05/2022 0800 Gross per 24 hour  Intake 700.77 ml  Output 1740 ml  Net -1039.23 ml      10/05/2022    5:00 AM 10/04/2022    5:42 AM 10/03/2022    8:00 AM  Last 3 Weights  Weight (lbs) 206 lb 9.1 oz 205 lb 210 lb 15.7 oz  Weight (kg) 93.7 kg 92.987 kg 95.7 kg     Body mass index is 30.51 kg/m.  General:  Well nourished, well developed, in no acute distress HEENT: normal Neck: no JVD Vascular: No carotid bruits; Distal pulses 2+ bilaterally Cardiac: RRR; 3/6SM Lungs:  CTA b/l, no wheezing, rhonchi or rales  Abd: soft, nontender Ext: no edema Musculoskeletal:  No deformities Skin: warm and dry  Neuro:  no focal abnormalities noted Psych:  Normal affect   EKG:  The EKG was personally reviewed and demonstrates:   Yesterday asynchronous VP 60 Today likely CHB with LBBB intrinsic and paced beats, 62bpm  OLD 09/30/22 SR 84bpm, normal intervals   Telemetry:  Telemetry was personally reviewed and demonstrates:   Occasionally appears to have 1:1 with a very long PR iterval though falls back into CHB  Relevant CV Studies:  10/04/22: limited echo  1. Echo guided TAVR. Vmax 0.9 m/s, MG 2.0 mmHG, EOA 3.9 cm2, DI 0.79. No  regurgitation or paravalvular leak. There is a 26 mm Sapien prosthetic  (TAVR) valve present in the aortic position. Procedure Date: 10/04/2022.   2. Left ventricular ejection fraction, by estimation, is 60 to 65%. The  left ventricle has normal function.  There is moderate concentric left  ventricular hypertrophy.   3. Right ventricular systolic function is normal. The right ventricular  size is normal.   4. The mitral valve is grossly normal. Trivial mitral valve  regurgitation. No evidence of mitral stenosis.   08/08/22: TTE 1. Left ventricular ejection fraction, by estimation, is 65 to 70%. The  left ventricle has normal function. The left ventricle has no regional  wall motion abnormalities. Left ventricular diastolic parameters were  normal.   2. Right ventricular systolic function is normal. The right ventricular  size is normal. There is normal pulmonary artery systolic pressure.   3. The mitral valve is normal in structure. Trivial mitral valve  regurgitation. No evidence of mitral stenosis.   4. The inferior vena cava is normal in size with greater than 50%  respiratory variability, suggesting right atrial pressure of 3 mmHg.   5. The aortic valve is calcified. There is severe calcifcation of the  aortic valve. Aortic valve regurgitation is trivial. Severe aortic valve  stenosis. Vmax 4.3 m/s, MG 44 mmHg, AVA 0.8 cm^2, DI 0.26    04/01/22: LHC/PCI Ost LAD to Prox LAD lesion is 100% stenosed.  LIMA to LAD is patent.   Dist LAD lesion is 70% stenosed.  Diffuse disease noted in the native LAD.   Prox RCA to Mid RCA lesion is 100% stenosed.  SVG to PDA is patent.   Dist RCA lesion is 40% stenosed.   Ost RPDA to RPDA  lesion is 40% stenosed.   Ost Cx to Prox Cx lesion is 99% stenosed.   SVG to OM is patent.  There is a Dist Graft to Insertion lesion is 80% stenosed.   Scoring balloon angioplasty was performed using a 1.75 mm score flex, followed by a BALL SAPPHIRE NC24 2.50X10.   Post intervention, there is a 10% residual stenosis.   There is moderate aortic valve stenosis.  Mean gradient 37 mmHg   Continue aggressive secondary prevention.  Dual antiplatelet therapy going forward.   Area of disease was distal in the graft and  there is difficulty in delivering equipment as well.  I do not think an additional stent would be beneficial.  Could consider drug-eluting balloon if available in the event that he has further restenosis.   Laboratory Data:  High Sensitivity Troponin:  No results for input(s): "TROPONINIHS" in the last 720 hours.   Chemistry Recent Labs  Lab 09/30/22 0930 10/04/22 0804 10/04/22 0932 10/04/22 1002 10/05/22 0518  NA 136   < > 138 139 136  K 4.0   < > 4.4 4.2 4.2  CL 102   < > 106 105 107  CO2 22  --   --   --  20*  GLUCOSE 175*   < > 205* 193* 169*  BUN 14   < > 14 15 14   CREATININE 1.37*   < > 1.10 1.00 1.17  CALCIUM 9.4  --   --   --  8.4*  MG  --   --   --   --  1.8  GFRNONAA 53*  --   --   --  >60  ANIONGAP 12  --   --   --  9   < > = values in this interval not displayed.    Recent Labs  Lab 09/30/22 0930  PROT 7.3  ALBUMIN 4.3  AST 24  ALT 20  ALKPHOS 52  BILITOT 0.8   Lipids No results for input(s): "CHOL", "TRIG", "HDL", "LABVLDL", "LDLCALC", "CHOLHDL" in the last 168 hours.  Hematology Recent Labs  Lab 09/30/22 0930 10/04/22 0804 10/04/22 0932 10/04/22 1002 10/05/22 0518  WBC 6.9  --   --   --  9.0  RBC 4.83  --   --   --  4.14*  HGB 14.3   < > 11.6* 11.6* 12.5*  HCT 42.3   < > 34.0* 34.0* 35.7*  MCV 87.6  --   --   --  86.2  MCH 29.6  --   --   --  30.2  MCHC 33.8  --   --   --  35.0  RDW 13.0  --   --   --  13.0  PLT 233  --   --   --  169   < > = values in this interval not displayed.   Thyroid No results for input(s): "TSH", "FREET4" in the last 168 hours.  BNPNo results for input(s): "BNP", "PROBNP" in the last 168 hours.  DDimer No results for input(s): "DDIMER" in the last 168 hours.   Radiology/Studies:     Assessment and Plan:   CHB Post TAVR No evidence of any recovery of conduction this AM Post TAVR EF yesterday 60-65% LBBB escape  He [name] [name] need PPM I discussed with him rational for PPM, implant procedure, potential  risks,benefits He is agreeable to proceed.   VHD S/p TAVR POD #1 today   CAD CABG and multiple PCIs Most recently  June 2023 to SVG to OM [name] [name] resume Plavix post PPM pending pocket stability  Risk Assessment/Risk Scores:    For questions or updates, please contact [place] Please consult [email or web address] for contact info under    Signed, Baldwin [place], PA-C  10/05/2022 10:00 AM  I have seen and examined this patient with [name] [name].  Agree with above, note added to reflect my findings.  Patient with a past medical history as above.  He is postop day 1 from TAVR.  Prior to implantation of the valve, the patient went into complete heart block.  He remains in complete heart block with a temporary wire in his right femoral vein.  Currently feeling well.  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  No recovery of conduction.  Has a left bundle branch escape with prior normal conduction.  Due to that, we [name] [name] need pacemaker implant.  Risk and benefits were discussed which which include bleeding, tamponade, infection, pneumothorax, lead dislodgment.  He understands these risks and is agreed to the procedure.  [name] [name] M. Shantelle Alles MD 10/05/2022 1:20 PM

## 2022-10-05 NOTE — Progress Notes (Signed)
  Echocardiogram 2D Echocardiogram has been performed.  [name] [name] 10/05/2022, 10:23 AM

## 2022-10-05 NOTE — Interval H&P Note (Signed)
History and Physical Interval Note:  10/05/2022 1:22 PM  [name] [name]  has presented today for surgery, with the diagnosis of heart block.  The various methods of treatment have been discussed with the patient and family. After consideration of risks, benefits and other options for treatment, the patient has consented to  Procedure(s): PACEMAKER IMPLANT (N/A) as a surgical intervention.  The patient's history has been reviewed, patient examined, no change in status, stable for surgery.  I have reviewed the patient's chart and labs.  Questions were answered to the patient's satisfaction.     Fleda Pagel [employer]

## 2022-10-05 NOTE — H&P (View-Only) (Signed)
Cardiology Consultation   Patient ID: [name] [name] MRN: [id number]; DOB: 04-11-1946  Admit date: 10/04/2022 Date of Consult: 10/05/2022  PCP:  Marda Stalker, [place] Providers Cardiologist:  Sherren Mocha, MD   {    Patient Profile:   [name] [name] is a 76 y.o. male with a hx of CAD (prior CABG/PCIs post CABG, most recently PCI to SVG to OM June 2023), PVD (R CFA endarterectomy), HTN, HLD, DM, VHD w/severe AS who is being seen 10/05/2022 for the evaluation of post TAVR CHB at the request of Dr. [name].  History of Present Illness:   [name] [name] follows closely with Dr. [name] and his team, with ongoing and progressive symptoms edspite multiple PCIs (and felt ot have received the maximal benefit of PCI procedures and antianginal medications), planned to pursue TAVR for his AS.  He was brought yesterday and underwent TAVR with Dr. [name], developed CHB with deployment of the valve requiring pacing via the temp wire.  26 mm Sapien 3 Ultra Resilia valve   POST op day #1 this AM he remains in CHB and EP is asked to see him.  Back up pacing is off and he remains in CHB 50's (No evidence of conduction system disease pre-TAVR)  Limited echo yesterday post TAVR LVEF 60-65% No perivalvular leak  LABS K+ 4.2 Mag 1.8 BUN/Creat 14/1.17 WBC 9.0 H/H 12.5/35.7 Plts 169  Home meds include lopressor 110m BID, none here  Back is bothering him in bed, otherwise no complaints. 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 CBurt Knack& Dr. BCyndia Bent  Severe aortic stenosis     Past Surgical History:  Procedure Laterality Date   ABDOMINAL AORTOGRAM W/LOWER EXTREMITY N/A 12/30/2020   Procedure: ABDOMINAL AORTOGRAM W/LOWER EXTREMITY;  Surgeon: AWellington Hampshire MD;  [facility];  Service: Cardiovascular;  Laterality: N/A;   ADENOIDECTOMY     CARDIAC CATHETERIZATION N/A 07/18/2016   Procedure: Left Heart Cath and Cors/Grafts Angiography;  Surgeon: CBurnell Blanks MD;  [facility];  Service: Cardiovascular;  Laterality: N/A;   CARDIAC CATHETERIZATION N/A 07/18/2016   Procedure: Coronary Stent Intervention;  Surgeon: CBurnell Blanks MD;  [facility];  Service: Cardiovascular;  Laterality: N/A;   CHOLECYSTECTOMY     CORONARY ANGIOPLASTY WITH STENT PLACEMENT     CORONARY ARTERY BYPASS GRAFT     CORONARY BALLOON ANGIOPLASTY N/A 04/01/2022   Procedure: CORONARY BALLOON ANGIOPLASTY;  Surgeon: VJettie Booze MD;  [facility];  Service: Cardiovascular;  Laterality: N/A;  SVG-OM1   CORONARY BALLOON ANGIOPLASTY N/A 09/10/2021   Procedure: CORONARY BALLOON ANGIOPLASTY;  Surgeon: CSherren Mocha MD;  [facility];  Service: Cardiovascular;  Laterality: N/A;   CORONARY STENT INTERVENTION N/A 01/11/2019   Procedure: CORONARY STENT INTERVENTION;  Surgeon: CSherren Mocha MD;  [facility];  Service: Cardiovascular;  Laterality:  N/A;   ENDARTERECTOMY FEMORAL Right 01/13/2021   Procedure: RIGHT FEMORAL ENDARTERECTOMY  WITH PATCH ANGIOPLASTY;  Surgeon: Rosetta Posner, MD;  Location: [place];  Service: Vascular;  Laterality: Right;   INGUINAL HERNIA REPAIR Right    INTRAOPERATIVE TRANSTHORACIC ECHOCARDIOGRAM N/A 10/04/2022   Procedure: INTRAOPERATIVE TRANSTHORACIC ECHOCARDIOGRAM;  Surgeon: Sherren Mocha, MD;  Location: [place];  Service: Open Heart Surgery;  Laterality: N/A;   LEFT HEART CATH AND  CORS/GRAFTS ANGIOGRAPHY N/A 01/11/2019   Procedure: LEFT HEART CATH AND CORS/GRAFTS ANGIOGRAPHY;  Surgeon: Sherren Mocha, MD;  Location: [facility];  Service: Cardiovascular;  Laterality: N/A;   LEFT HEART CATH AND CORS/GRAFTS ANGIOGRAPHY N/A 04/01/2022   Procedure: LEFT HEART CATH AND CORS/GRAFTS ANGIOGRAPHY;  Surgeon: Jettie Booze, MD;  Location: [facility];  Service: Cardiovascular;  Laterality: N/A;   LEFT HEART CATH AND CORS/GRAFTS ANGIOGRAPHY N/A 09/10/2021   Procedure: LEFT HEART CATH AND CORS/GRAFTS ANGIOGRAPHY;  Surgeon: Sherren Mocha, MD;  Location: [facility];  Service: Cardiovascular;  Laterality: N/A;   Right femoral endarterectomy and Dacron patch angioplasty  01/13/3021   TONSILLECTOMY     TRANSCATHETER AORTIC VALVE REPLACEMENT, TRANSFEMORAL Left 10/04/2022   Procedure: Transcatheter Aortic Valve Replacement, Transfemoral;  Surgeon: Sherren Mocha, MD;  Location: [place];  Service: Open Heart Surgery;  Laterality: Left;     Home Medications:  Prior to Admission medications   Medication Sig Start Date End Date Taking? Authorizing Provider  amLODipine (NORVASC) 10 MG tablet Take 10 mg by mouth daily.   Yes [provider]  aspirin 81 MG EC tablet TAKE 1 TABLET BY MOUTH EVERY DAY 02/23/21  Yes Sherren Mocha, MD  benazepril (LOTENSIN) 40 MG tablet Take 40 mg by mouth daily.  04/30/12  Yes [provider]  clopidogrel (PLAVIX) 75 MG tablet TAKE 1 TABLET BY MOUTH EVERY DAY 01/17/22  Yes Sherren Mocha, MD  finasteride (PROSCAR) 5 MG tablet Take 5 mg by mouth daily. 03/06/22  Yes [provider]  hydrochlorothiazide (HYDRODIURIL) 25 MG tablet TAKE 1 TABLET BY MOUTH EVERY DAY 09/24/21  Yes Sherren Mocha, MD  isosorbide mononitrate (IMDUR) 60 MG 24 hr tablet Take 1.5 tablets (90 mg total) by mouth 2 (two) times daily. Patient taking differently: Take 120 mg by mouth 2 (two) times daily. 07/16/22  Yes Sande Rives E, PA-C   metFORMIN (GLUCOPHAGE) 1000 MG tablet Take 1,000 mg by mouth 2 (two) times daily with a meal. 05/21/19  Yes [provider]  metoprolol tartrate 75 MG TABS Take 75 mg by mouth 2 (two) times daily. Patient taking differently: Take 50 mg by mouth 2 (two) times daily. 07/16/22  Yes Sande Rives E, PA-C  Multiple Vitamin (MULTIVITAMIN WITH MINERALS) TABS tablet Take 1 tablet by mouth daily.   Yes [provider]  nitroGLYCERIN (NITROSTAT) 0.4 MG SL tablet DISSOLVE 1 TABLET UNDER THE TONGUE EVERY 5 MINUTES AS NEEDED FOR CHEST PAIN. MAX OF 3 DOSES, THEN 911. 07/29/22  Yes Sherren Mocha, MD  NOVOLIN N FLEXPEN 100 UNIT/ML FlexPen Inject 15 Units into the skin 2 (two) times daily. 09/26/22  Yes [provider]  ranolazine (RANEXA) 500 MG 12 hr tablet Take 500 mg by mouth 2 (two) times daily. 09/23/22  Yes [provider]  rosuvastatin (CRESTOR) 40 MG tablet Take 1 tablet (40 mg total) by mouth daily. Patient taking differently: Take 20 mg by mouth daily. 08/31/22  Yes Finis Bud, NP  glucose blood (PRECISION QID TEST) test strip  1 each by Other route as directed. 08/03/20   [provider]  Insulin Pen Needle (B-D UF III MINI PEN NEEDLES) 31G X 5 MM MISC Use to inject Ozempic once a week 01/02/21   [provider]  phenazopyridine (PYRIDIUM) 100 MG tablet Take 1 tablet (100 mg total) by mouth daily. Urinary urgency Patient not taking: Reported on 09/29/2022 07/16/22   Darreld Mclean, PA-C  ranolazine (RANEXA) 1000 MG SR tablet Take 1 tablet (1,000 mg total) by mouth 2 (two) times daily. Patient not taking: Reported on 09/29/2022 04/25/22 04/26/23  Isaiah Serge, NP    Inpatient Medications: Scheduled Meds:  amLODipine  10 mg Oral Daily   aspirin EC  81 mg Oral Daily   benazepril  40 mg Oral Daily   Chlorhexidine Gluconate Cloth  6 each Topical Daily   finasteride  5 mg Oral Daily   hydrochlorothiazide  25 mg Oral Daily   insulin aspart   0-24 Units Subcutaneous TID AC & HS   isosorbide mononitrate  90 mg Oral BID   ranolazine  500 mg Oral BID   rosuvastatin  40 mg Oral Daily   sodium chloride flush  3 mL Intravenous Q12H   Continuous Infusions:  sodium chloride 10 mL/hr at 10/05/22 0800   nitroGLYCERIN     PRN Meds: sodium chloride, acetaminophen **OR** acetaminophen, morphine injection, ondansetron (ZOFRAN) IV, oxyCODONE, sodium chloride flush, traMADol  Allergies:    Allergies  Allergen Reactions   Empagliflozin Other (See Comments)    Abdominal pain/dizziness    Social History:   Social History   Socioeconomic History   Marital status: Married    Spouse name: Not on file   Number of children: Not on file   Years of education: Not on file   Highest education level: Not on file  Occupational History   Not on file  Tobacco Use   Smoking status: Never   Smokeless tobacco: Never  Vaping Use   Vaping Use: Never used  Substance and Sexual Activity   Alcohol use: No   Drug use: No   Sexual activity: Not on file  Other Topics Concern   Not on file  Social History Narrative   Not on file   Social Determinants of Health   Financial Resource Strain: Not on file  Food Insecurity: No Food Insecurity (10/04/2022)   Hunger Vital Sign    Worried About Running Out of Food in the Last Year: Never true    Ran Out of Food in the Last Year: Never true  Transportation Needs: No Transportation Needs (10/04/2022)   PRAPARE - Hydrologist (Medical): No    Lack of Transportation (Non-Medical): No  Physical Activity: Not on file  Stress: Not on file  Social Connections: Not on file  Intimate Partner Violence: Not At Risk (10/04/2022)   Humiliation, Afraid, Rape, and Kick questionnaire    Fear of Current or Ex-Partner: No    Emotionally Abused: No    Physically Abused: No    Sexually Abused: No    Family History:   Family History  Problem Relation Age of Onset   Heart disease  Mother        No details   Diabetes Neg Hx      ROS:  Please see the history of present illness.  All other ROS reviewed and negative.     Physical Exam/Data:   Vitals:   10/05/22 0600 10/05/22 0635 10/05/22 0700 10/05/22  0800  BP: (!) 136/51  (!) 131/52 (!) 156/55  Pulse: (!) 52  (!) 51 (!) 50  Resp: (!) 28  (!) 22 19  Temp:  98.6 F (37 C)    TempSrc:  Oral    SpO2: 93%  96% 96%  Weight:      Height:        Intake/Output Summary (Last 24 hours) at 10/05/2022 1000 Last data filed at 10/05/2022 0800 Gross per 24 hour  Intake 700.77 ml  Output 1740 ml  Net -1039.23 ml      10/05/2022    5:00 AM 10/04/2022    5:42 AM 10/03/2022    8:00 AM  Last 3 Weights  Weight (lbs) 206 lb 9.1 oz 205 lb 210 lb 15.7 oz  Weight (kg) 93.7 kg 92.987 kg 95.7 kg     Body mass index is 30.51 kg/m.  General:  Well nourished, well developed, in no acute distress HEENT: normal Neck: no JVD Vascular: No carotid bruits; Distal pulses 2+ bilaterally Cardiac: RRR; 3/6SM Lungs:  CTA b/l, no wheezing, rhonchi or rales  Abd: soft, nontender Ext: no edema Musculoskeletal:  No deformities Skin: warm and dry  Neuro:  no focal abnormalities noted Psych:  Normal affect   EKG:  The EKG was personally reviewed and demonstrates:   Yesterday asynchronous VP 60 Today likely CHB with LBBB intrinsic and paced beats, 62bpm  OLD 09/30/22 SR 84bpm, normal intervals   Telemetry:  Telemetry was personally reviewed and demonstrates:   Occasionally appears to have 1:1 with a very long PR iterval though falls back into CHB  Relevant CV Studies:  10/04/22: limited echo  1. Echo guided TAVR. Vmax 0.9 m/s, MG 2.0 mmHG, EOA 3.9 cm2, DI 0.79. No  regurgitation or paravalvular leak. There is a 26 mm Sapien prosthetic  (TAVR) valve present in the aortic position. Procedure Date: 10/04/2022.   2. Left ventricular ejection fraction, by estimation, is 60 to 65%. The  left ventricle has normal function.  There is moderate concentric left  ventricular hypertrophy.   3. Right ventricular systolic function is normal. The right ventricular  size is normal.   4. The mitral valve is grossly normal. Trivial mitral valve  regurgitation. No evidence of mitral stenosis.   08/08/22: TTE 1. Left ventricular ejection fraction, by estimation, is 65 to 70%. The  left ventricle has normal function. The left ventricle has no regional  wall motion abnormalities. Left ventricular diastolic parameters were  normal.   2. Right ventricular systolic function is normal. The right ventricular  size is normal. There is normal pulmonary artery systolic pressure.   3. The mitral valve is normal in structure. Trivial mitral valve  regurgitation. No evidence of mitral stenosis.   4. The inferior vena cava is normal in size with greater than 50%  respiratory variability, suggesting right atrial pressure of 3 mmHg.   5. The aortic valve is calcified. There is severe calcifcation of the  aortic valve. Aortic valve regurgitation is trivial. Severe aortic valve  stenosis. Vmax 4.3 m/s, MG 44 mmHg, AVA 0.8 cm^2, DI 0.26    04/01/22: LHC/PCI Ost LAD to Prox LAD lesion is 100% stenosed.  LIMA to LAD is patent.   Dist LAD lesion is 70% stenosed.  Diffuse disease noted in the native LAD.   Prox RCA to Mid RCA lesion is 100% stenosed.  SVG to PDA is patent.   Dist RCA lesion is 40% stenosed.   Ost RPDA to RPDA  lesion is 40% stenosed.   Ost Cx to Prox Cx lesion is 99% stenosed.   SVG to OM is patent.  There is a Dist Graft to Insertion lesion is 80% stenosed.   Scoring balloon angioplasty was performed using a 1.75 mm score flex, followed by a BALL SAPPHIRE NC24 2.50X10.   Post intervention, there is a 10% residual stenosis.   There is moderate aortic valve stenosis.  Mean gradient 37 mmHg   Continue aggressive secondary prevention.  Dual antiplatelet therapy going forward.   Area of disease was distal in the graft and  there is difficulty in delivering equipment as well.  I do not think an additional stent would be beneficial.  Could consider drug-eluting balloon if available in the event that he has further restenosis.   Laboratory Data:  High Sensitivity Troponin:  No results for input(s): "TROPONINIHS" in the last 720 hours.   Chemistry Recent Labs  Lab 09/30/22 0930 10/04/22 0804 10/04/22 0932 10/04/22 1002 10/05/22 0518  NA 136   < > 138 139 136  K 4.0   < > 4.4 4.2 4.2  CL 102   < > 106 105 107  CO2 22  --   --   --  20*  GLUCOSE 175*   < > 205* 193* 169*  BUN 14   < > 14 15 14   CREATININE 1.37*   < > 1.10 1.00 1.17  CALCIUM 9.4  --   --   --  8.4*  MG  --   --   --   --  1.8  GFRNONAA 53*  --   --   --  >60  ANIONGAP 12  --   --   --  9   < > = values in this interval not displayed.    Recent Labs  Lab 09/30/22 0930  PROT 7.3  ALBUMIN 4.3  AST 24  ALT 20  ALKPHOS 52  BILITOT 0.8   Lipids No results for input(s): "CHOL", "TRIG", "HDL", "LABVLDL", "LDLCALC", "CHOLHDL" in the last 168 hours.  Hematology Recent Labs  Lab 09/30/22 0930 10/04/22 0804 10/04/22 0932 10/04/22 1002 10/05/22 0518  WBC 6.9  --   --   --  9.0  RBC 4.83  --   --   --  4.14*  HGB 14.3   < > 11.6* 11.6* 12.5*  HCT 42.3   < > 34.0* 34.0* 35.7*  MCV 87.6  --   --   --  86.2  MCH 29.6  --   --   --  30.2  MCHC 33.8  --   --   --  35.0  RDW 13.0  --   --   --  13.0  PLT 233  --   --   --  169   < > = values in this interval not displayed.   Thyroid No results for input(s): "TSH", "FREET4" in the last 168 hours.  BNPNo results for input(s): "BNP", "PROBNP" in the last 168 hours.  DDimer No results for input(s): "DDIMER" in the last 168 hours.   Radiology/Studies:     Assessment and Plan:   CHB Post TAVR No evidence of any recovery of conduction this AM Post TAVR EF yesterday 60-65% LBBB escape  He [name] [name] need PPM I discussed with him rational for PPM, implant procedure, potential  risks,benefits He is agreeable to proceed.   VHD S/p TAVR POD #1 today   CAD CABG and multiple PCIs Most recently  June 2023 to SVG to OM [name] [name] resume Plavix post PPM pending pocket stability  Risk Assessment/Risk Scores:    For questions or updates, please contact [facility] Please consult [email or web address] for contact info under    Signed, Baldwin Jamaica, PA-C  10/05/2022 10:00 AM  I have seen and examined this patient with Tommye Standard.  Agree with above, note added to reflect my findings.  Patient with a past medical history as above.  He is postop day 1 from TAVR.  Prior to implantation of the valve, the patient went into complete heart block.  He remains in complete heart block with a temporary wire in his right femoral vein.  Currently feeling well.  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  No recovery of conduction.  Has a left bundle branch escape with prior normal conduction.  Due to that, we [name] [name] need pacemaker implant.  Risk and benefits were discussed which which include bleeding, tamponade, infection, pneumothorax, lead dislodgment.  He understands these risks and is agreed to the procedure.  Costas Sena M. Negin Hegg MD 10/05/2022 1:20 PM

## 2022-10-05 NOTE — Progress Notes (Addendum)
[place] VALVE TEAM  Patient Name: [name] [name] Date of Encounter: 10/05/2022  Admit date: 10/04/2022  Primary Care Provider: Marda Stalker, PA-C [facility] Cardiologist: Sherren Mocha, MD  [facility] Electrophysiologist:  None   [facility] Problem List     Principal Problem:   S/P TAVR (transcatheter aortic valve replacement) Active Problems:   HLD (hyperlipidemia)   Essential hypertension   CAD s/p CABG x5   Insulin dependent type 2 diabetes mellitus (HCC)   PAD (peripheral artery disease) (HCC)   Severe aortic stenosis     Subjective   No complaints. Wonders when he can get up and walk around.   Inpatient Medications    Scheduled Meds:  amLODipine  10 mg Oral Daily   aspirin EC  81 mg Oral Daily   benazepril  40 mg Oral Daily   Chlorhexidine Gluconate Cloth  6 each Topical Daily   finasteride  5 mg Oral Daily   hydrochlorothiazide  25 mg Oral Daily   insulin aspart  0-24 Units Subcutaneous TID AC & HS   isosorbide mononitrate  90 mg Oral BID   ranolazine  500 mg Oral BID   rosuvastatin  40 mg Oral Daily   sodium chloride flush  3 mL Intravenous Q12H   Continuous Infusions:  sodium chloride 10 mL/hr at 10/05/22 0800   nitroGLYCERIN     PRN Meds: sodium chloride, acetaminophen **OR** acetaminophen, morphine injection, ondansetron (ZOFRAN) IV, oxyCODONE, sodium chloride flush, traMADol   Vital Signs    Vitals:   10/05/22 0600 10/05/22 0635 10/05/22 0700 10/05/22 0800  BP: (!) 136/51  (!) 131/52 (!) 156/55  Pulse: (!) 52  (!) 51 (!) 50  Resp: (!) 28  (!) 22 19  Temp:  98.6 F (37 C)    TempSrc:  Oral    SpO2: 93%  96% 96%  Weight:      Height:        Intake/Output Summary (Last 24 hours) at 10/05/2022 0952 Last data filed at 10/05/2022 0800 Gross per 24 hour  Intake 700.77 ml  Output 1740 ml  Net -1039.23 ml   Filed Weights   10/03/22 0800 10/04/22 0542 10/05/22 0500  Weight: 95.7 kg 93  kg 93.7 kg    Physical Exam    GEN: Well nourished, well developed, in no acute distress.  HEENT: Grossly normal.  Neck: Supple, no JVD, or masses. Cardiac: RRR, 3.6 flow murmur. No rubs, or gallops. No clubbing, cyanosis, edema.   Respiratory:  Respirations regular and unlabored, clear to auscultation bilaterally. GI: Soft, nontender, nondistended, BS + x 4. MS: no deformity or atrophy. Skin: warm and dry, no rash.  Groin sites clear without hematoma or ecchymosis, temp wire in place on right  Neuro:  Strength and sensation are intact. Psych: AAOx3.  Normal affect.  Labs    CBC Recent Labs    10/04/22 1002 10/05/22 0518  WBC  --  9.0  HGB 11.6* 12.5*  HCT 34.0* 35.7*  MCV  --  86.2  PLT  --  993   Basic Metabolic Panel Recent Labs    10/04/22 1002 10/05/22 0518  NA 139 136  K 4.2 4.2  CL 105 107  CO2  --  20*  GLUCOSE 193* 169*  BUN 15 14  CREATININE 1.00 1.17  CALCIUM  --  8.4*  MG  --  1.8   Liver Function Tests No results for input(s): "AST", "ALT", "ALKPHOS", "BILITOT", "PROT", "  ALBUMIN" in the last 72 hours. No results for input(s): "LIPASE", "AMYLASE" in the last 72 hours. Cardiac Enzymes No results for input(s): "CKTOTAL", "CKMB", "CKMBINDEX", "TROPONINI" in the last 72 hours. BNP Invalid input(s): "POCBNP" D-Dimer No results for input(s): "DDIMER" in the last 72 hours. Hemoglobin A1C No results for input(s): "HGBA1C" in the last 72 hours. Fasting Lipid Panel No results for input(s): "CHOL", "HDL", "LDLCALC", "TRIG", "CHOLHDL", "LDLDIRECT" in the last 72 hours. Thyroid Function Tests No results for input(s): "TSH", "T4TOTAL", "T3FREE", "THYROIDAB" in the last 72 hours.  Invalid input(s): "FREET3"  Telemetry    CHB in 73s - Personally Reviewed  ECG    CHB with LBBB and V paced beats - Personally Reviewed  Radiology    ECHOCARDIOGRAM LIMITED  Result Date: 10/04/2022    ECHOCARDIOGRAM LIMITED REPORT   Patient Name:   [name] [name] Date  of Exam: 10/04/2022 Medical Rec #:  [id number]     Height:       69.0 in Accession #:    [id number]    Weight:       205.0 lb Date of Birth:  08-15-1946     BSA:          2.088 m Patient Age:    76 years      BP:           178/76 mmHg Patient Gender: M             HR:           82 bpm. Exam Location:  Inpatient Procedure: Limited Echo, Cardiac Doppler and Color Doppler Indications:     I35.0 Nonrheumatic aortic (valve) stenosis  History:         Patient has prior history of Echocardiogram examinations, most                  recent 08/08/2022. CAD and Previous Myocardial Infarction,                  Prior CABG, Aortic Valve Disease, Signs/Symptoms:Chest Pain;                  Risk Factors:Diabetes, Dyslipidemia and Hypertension.                  Aortic Valve: 26 mm Sapien prosthetic, stented (TAVR) valve is                  present in the aortic position. Procedure Date: 10/04/2022.  Sonographer:     Roseanna Rainbow RDCS Referring Phys:  [place] Diagnosing Phys: Eleonore Chiquito MD  Sonographer Comments: Technically difficult study due to poor echo windows. TAVR procedure using 31m ES valve. IMPRESSIONS  1. Echo guided TAVR. Vmax 0.9 m/s, MG 2.0 mmHG, EOA 3.9 cm2, DI 0.79. No regurgitation or paravalvular leak. There is a 26 mm Sapien prosthetic (TAVR) valve present in the aortic position. Procedure Date: 10/04/2022.  2. Left ventricular ejection fraction, by estimation, is 60 to 65%. The left ventricle has normal function. There is moderate concentric left ventricular hypertrophy.  3. Right ventricular systolic function is normal. The right ventricular size is normal.  4. The mitral valve is grossly normal. Trivial mitral valve regurgitation. No evidence of mitral stenosis. FINDINGS  Left Ventricle: Left ventricular ejection fraction, by estimation, is 60 to 65%. The left ventricle has normal function. The left ventricular internal cavity size was normal in size. There is moderate concentric left ventricular  hypertrophy. Right Ventricle: The right  ventricular size is normal. No increase in right ventricular wall thickness. Right ventricular systolic function is normal. Pericardium: Trivial pericardial effusion is present. The pericardial effusion is posterior to the left ventricle. Mitral Valve: The mitral valve is grossly normal. Trivial mitral valve regurgitation. No evidence of mitral valve stenosis. Tricuspid Valve: The tricuspid valve is grossly normal. Tricuspid valve regurgitation is trivial. No evidence of tricuspid stenosis. Aortic Valve: Echo guided TAVR. Vmax 0.9 m/s, MG 2.0 mmHG, EOA 3.9 cm2, DI 0.79. No regurgitation or paravalvular leak. Aortic valve mean gradient measures 2.0 mmHg. Aortic valve peak gradient measures 3.8 mmHg. Aortic valve area, by VTI measures 3.90 cm. There is a 26 mm Sapien prosthetic, stented (TAVR) valve present in the aortic position. Procedure Date: 10/04/2022. Pulmonic Valve: The pulmonic valve was grossly normal. Pulmonic valve regurgitation is trivial. No evidence of pulmonic stenosis. Aorta: The aortic root and ascending aorta are structurally normal, with no evidence of dilitation. Additional Comments: Spectral Doppler performed. Color Doppler performed.  LEFT VENTRICLE PLAX 2D LVIDd:         4.10 cm LVIDs:         2.40 cm LV PW:         1.40 cm LV IVS:        1.70 cm LVOT diam:     2.50 cm LV SV:         87 LV SV Index:   42 LVOT Area:     4.91 cm  AORTIC VALVE AV Area (Vmax):    4.67 cm AV Area (Vmean):   3.88 cm AV Area (VTI):     3.90 cm AV Vmax:           97.00 cm/s AV Vmean:          68.000 cm/s AV VTI:            0.224 m AV Peak Grad:      3.8 mmHg AV Mean Grad:      2.0 mmHg LVOT Vmax:         92.30 cm/s LVOT Vmean:        53.700 cm/s LVOT VTI:          0.178 m LVOT/AV VTI ratio: 0.79  AORTA Ao Asc diam: 3.60 cm  SHUNTS Systemic VTI:  0.18 m Systemic Diam: 2.50 cm Eleonore Chiquito MD Electronically signed by Eleonore Chiquito MD Signature Date/Time: 10/04/2022/1:18:40 PM     Final    Structural Heart Procedure  Result Date: 10/04/2022 See surgical note for result.   Cardiac Studies   TAVR OPERATIVE NOTE     Date of Procedure:                10/04/2022   Preoperative Diagnosis:      Severe Aortic Stenosis    Postoperative Diagnosis:    Same    Procedure:        Transcatheter Aortic Valve Replacement - Percutaneous  Transfemoral Approach             Edwards Sapien 3 Ultra Resilia THV (size 26 mm, serial # [id number])              Co-Surgeons:                        Coralie Common, MD and Sherren Mocha, MD   Anesthesiologist:                  Renold Don, MD   Echocardiographer:  Marry Guan, MD   Pre-operative Echo Findings: Severe aortic stenosis Normal left ventricular systolic function   Post-operative Echo Findings: No paravalvular leak Normal/unchanged left ventricular systolic function  ___________________  Echo 10/05/22: pending  Patient Profile     [name] [name] is a 76 y.o. male with a history of CAD s/p CABG 2009, and subsequent multivessel cononary stenting and chronic angina due to small vessel CAD, obesity, PAD s/p fem endart 2022, DM2, HTN, HLD, and severe AS who presented to [facility] on 10/04/22 for planned TAVR.   Assessment & Plan    Severe AS: s/p successful TAVR with a 26 mm Edwards Sapien 3 Ultra Resilia THV via the TF approach on 10/04/22. Post operative echo completed but pending formal read. Groin sites are stable. ECG shows CHB with LBBB and ventricular pacing. Continued on his chronic DAPT, but holding plavix pending PPM placement.   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 Temp wire was left in place and he was transferred to ICU. He remains in CHB but pacer turned off. EP to see today. NPO for possible PPM.    CAD: s/p CABG in 2009 and subsequent multiple PCI in 08/2021 and 03/2022. Per last cath note, "area of disease was distal in the graft and there is difficulty in delivering equipment as well.  I do not think an additional stent would be beneficial. Could consider drug-eluting balloon if available in the event that he has further restenosis." Continue medical therapy.   PAD: s/p rt fem endart in 2022. Vascular follows for PAD and carotid stenosis. Continue medical management with aspirin and crestor   DM2: continue SSI    HTN: mildly elevated. BB on hold. Will resume after PPM   HLD: continue crestor  Signed, Angelena Form, PA-C  10/05/2022, 9:52 AM  Pager [phone]  Patient seen, examined. Available data reviewed. Agree with findings, assessment, and plan as outlined by Nell Range, PA-C.  The patient is independently interviewed and examined.  He is alert, oriented, no distress.  HEENT is normal, lungs are clear bilaterally, heart is regular rate and rhythm with a 2/6 systolic ejection murmur at the right upper sternal border, no diastolic murmur, abdomen is soft and nontender, bilateral groin sites are clear, extremities have no edema.  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  Appreciate EP evaluation.  Patient scheduled for permanent pacemaker later this afternoon.  Reviewed in detail with the patient who understands the indication for permanent pacing with complete heart block following TAVR with no evidence of recovery.  He otherwise is clinically stable.  As long as his pacemaker procedure goes smoothly, I would anticipate that he will be ready for discharge tomorrow.  Will resume his Plavix when okayed by the EP team after his pocket is healed.  Sherren Mocha, M.D. 10/05/2022 12:31 PM

## 2022-10-06 ENCOUNTER — Inpatient Hospital Stay (HOSPITAL_COMMUNITY): Payer: Medicare Other

## 2022-10-06 ENCOUNTER — Encounter (HOSPITAL_COMMUNITY): Payer: Self-pay | Admitting: Cardiology

## 2022-10-06 DIAGNOSIS — I350 Nonrheumatic aortic (valve) stenosis: Secondary | ICD-10-CM | POA: Diagnosis not present

## 2022-10-06 DIAGNOSIS — I25110 Atherosclerotic heart disease of native coronary artery with unstable angina pectoris: Secondary | ICD-10-CM | POA: Diagnosis not present

## 2022-10-06 DIAGNOSIS — I442 Atrioventricular block, complete: Secondary | ICD-10-CM | POA: Insufficient documentation

## 2022-10-06 DIAGNOSIS — Z952 Presence of prosthetic heart valve: Secondary | ICD-10-CM

## 2022-10-06 DIAGNOSIS — Z950 Presence of cardiac pacemaker: Secondary | ICD-10-CM | POA: Insufficient documentation

## 2022-10-06 DIAGNOSIS — Z006 Encounter for examination for normal comparison and control in clinical research program: Secondary | ICD-10-CM | POA: Diagnosis not present

## 2022-10-06 LAB — CBC
HCT: 37.6 % — ABNORMAL LOW (ref 39.0–52.0)
Hemoglobin: 12.7 g/dL — ABNORMAL LOW (ref 13.0–17.0)
MCH: 29.3 pg (ref 26.0–34.0)
MCHC: 33.8 g/dL (ref 30.0–36.0)
MCV: 86.8 fL (ref 80.0–100.0)
Platelets: 170 10*3/uL (ref 150–400)
RBC: 4.33 MIL/uL (ref 4.22–5.81)
RDW: 13.1 % (ref 11.5–15.5)
WBC: 8.5 10*3/uL (ref 4.0–10.5)
nRBC: 0 % (ref 0.0–0.2)

## 2022-10-06 LAB — BASIC METABOLIC PANEL
Anion gap: 10 (ref 5–15)
BUN: 12 mg/dL (ref 8–23)
CO2: 21 mmol/L — ABNORMAL LOW (ref 22–32)
Calcium: 9.1 mg/dL (ref 8.9–10.3)
Chloride: 103 mmol/L (ref 98–111)
Creatinine, Ser: 1.19 mg/dL (ref 0.61–1.24)
GFR, Estimated: >60 mL/min (ref >60)
Glucose, Bld: 252 mg/dL — ABNORMAL HIGH (ref 70–99)
Potassium: 4 mmol/L (ref 3.5–5.1)
Sodium: 134 mmol/L — ABNORMAL LOW (ref 135–145)

## 2022-10-06 LAB — GLUCOSE, CAPILLARY
Glucose-Capillary: 251 mg/dL — ABNORMAL HIGH (ref 70–99)
Glucose-Capillary: 228 mg/dL — ABNORMAL HIGH (ref 70–99)

## 2022-10-06 MED ORDER — METOPROLOL TARTRATE 25 MG PO TABS
75.0000 mg | ORAL_TABLET | Freq: Two times a day (BID) | ORAL | Status: DC
  Administered 2022-10-06: 75 mg via ORAL
  Filled 2022-10-06: qty 3

## 2022-10-06 MED ORDER — ORAL CARE MOUTH RINSE: 15.0000 mL | OROMUCOSAL | Status: DC | PRN

## 2022-10-06 MED FILL — Gentamicin Sulfate Inj 40 MG/ML: INTRAMUSCULAR | Qty: 80 | Status: AC

## 2022-10-06 MED FILL — Cefazolin Sodium For Inj 2 GM: INTRAMUSCULAR | Qty: 2 | Status: AC

## 2022-10-06 NOTE — Progress Notes (Signed)
CARDIAC REHAB PHASE I   PRE:  Rate/Rhythm: 95 NSR  BP:  Sitting: 142/100      SaO2: 96 RA  MODE:  Ambulation: 1,110 ft   AD:  None  POST:  Rate/Rhythm:                           126  V-paced           116-136 NSVT  BP:  Sitting: 154/77      SaO2: 94 RA  Pt amb with standby assistance, pt reports CP 2/10 and denies SOB during amb and was returned to room w/o complaint.   Post-ambulation RPE 7 (scale 6-20), Pt went into NSVT during amb, asymptomatic; nurse aware and checked on pt.   Pt educated on ex guidelines, heart healthy diet, restrictions, and CRPII. Pt has completed CRPII before. Will refer to [facility].   Christen Bame  9:03 AM 10/06/2022    Service time is from [place] to 0903.

## 2022-10-06 NOTE — TOC Transition Note (Signed)
Transition of Care [facility]) - CM/SW Discharge Note   Patient Details  Name: [name] [name] MRN: [id number] Date of Birth: [birth year]  [facility] Care [facility]) CM/SW Contact:  Erenest Rasher, RN Phone Number: [phone] 10/06/2022, 1:25 PM   Clinical Narrative:    Reviewed dc and no needs identified. Wife will provide transportation home.    Final next level of care: Home/Self Care Barriers to Discharge: No Barriers Identified   Patient Goals and CMS Choice Patient states their goals for this hospitalization and ongoing recovery are:: wants to remain independent CMS Medicare.gov Compare Post Acute Care list provided to:: Patient      Discharge Placement     Discharge Plan and Services   Discharge Planning Services: CM Consult   Social Determinants of Health (SDOH) Interventions     Readmission Risk Interventions     No data to display

## 2022-10-06 NOTE — Progress Notes (Addendum)
Rounding Note    Patient Name: [name] [name] Date of Encounter: 10/06/2022  [place] Cardiologist: Sherren Mocha, MD   Subjective   Feels well, no site pain, no CP, SOB  Inpatient Medications    Scheduled Meds:  amLODipine  10 mg Oral Daily   aspirin EC  81 mg Oral Daily   benazepril  40 mg Oral Daily   Chlorhexidine Gluconate Cloth  6 each Topical Daily   finasteride  5 mg Oral Daily   hydrochlorothiazide  25 mg Oral Daily   insulin aspart  0-24 Units Subcutaneous TID AC & HS   isosorbide mononitrate  90 mg Oral BID   metoprolol tartrate  75 mg Oral BID   ranolazine  500 mg Oral BID   rosuvastatin  40 mg Oral Daily   sodium chloride flush  3 mL Intravenous Q12H   sodium chloride flush  3 mL Intravenous Q12H   Continuous Infusions:  sodium chloride 10 mL/hr at 10/05/22 2205    ceFAZolin (ANCEF) IV Stopped (10/06/22 0334)   nitroGLYCERIN     PRN Meds: sodium chloride, acetaminophen **OR** acetaminophen, acetaminophen, morphine injection, ondansetron (ZOFRAN) IV, ondansetron (ZOFRAN) IV, mouth rinse, oxyCODONE, sodium chloride flush, traMADol   Vital Signs    Vitals:   10/06/22 0500 10/06/22 0600 10/06/22 0655 10/06/22 0700  BP: (!) 123/56 123/82  134/80  Pulse: 86 (!) 114  (!) 110  Resp: (!) 21 (!) 30  18  Temp:   98.4 F (36.9 C)   TempSrc:   Oral   SpO2: 94% 94%  94%  Weight:  93.5 kg    Height:        Intake/Output Summary (Last 24 hours) at 10/06/2022 0858 Last data filed at 10/06/2022 0700 Gross per 24 hour  Intake 1054.35 ml  Output 1170 ml  Net -115.65 ml      10/06/2022    6:00 AM 10/05/2022    5:00 AM 10/04/2022    5:42 AM  Last 3 Weights  Weight (lbs) 206 lb 2.1 oz 206 lb 9.1 oz 205 lb  Weight (kg) 93.5 kg 93.7 kg 92.987 kg      Telemetry    ST/ST 90's-100s, V paced - Personally Reviewed  ECG    ST 104bpm, V paced - Personally Reviewed  Physical Exam   GEN: No acute distress.   Neck: No JVD Cardiac: RRR, no  murmurs, rubs, or gallops.  Respiratory: CTA b/l. GI: Soft, nontender, non-distended  MS: No edema; No deformity. Neuro:  Nonfocal  Psych: Normal affect   PPM site is stable, no hematoma, bleeding  Labs    High Sensitivity Troponin:  No results for input(s): "TROPONINIHS" in the last 720 hours.   Chemistry Recent Labs  Lab 09/30/22 0930 10/04/22 0804 10/04/22 1002 10/05/22 0518 10/06/22 0359  NA 136   < > 139 136 134*  K 4.0   < > 4.2 4.2 4.0  CL 102   < > 105 107 103  CO2 22  --   --  20* 21*  GLUCOSE 175*   < > 193* 169* 252*  BUN 14   < > 15 14 12   CREATININE 1.37*   < > 1.00 1.17 1.19  CALCIUM 9.4  --   --  8.4* 9.1  MG  --   --   --  1.8  --   PROT 7.3  --   --   --   --   ALBUMIN 4.3  --   --   --   --  AST 24  --   --   --   --   ALT 20  --   --   --   --   ALKPHOS 52  --   --   --   --   BILITOT 0.8  --   --   --   --   GFRNONAA 53*  --   --  >60 >60  ANIONGAP 12  --   --  9 10   < > = values in this interval not displayed.    Lipids No results for input(s): "CHOL", "TRIG", "HDL", "LABVLDL", "LDLCALC", "CHOLHDL" in the last 168 hours.  Hematology Recent Labs  Lab 09/30/22 0930 10/04/22 0804 10/04/22 1002 10/05/22 0518 10/06/22 0359  WBC 6.9  --   --  9.0 8.5  RBC 4.83  --   --  4.14* 4.33  HGB 14.3   < > 11.6* 12.5* 12.7*  HCT 42.3   < > 34.0* 35.7* 37.6*  MCV 87.6  --   --  86.2 86.8  MCH 29.6  --   --  30.2 29.3  MCHC 33.8  --   --  35.0 33.8  RDW 13.0  --   --  13.0 13.1  PLT 233  --   --  169 170   < > = values in this interval not displayed.   Thyroid No results for input(s): "TSH", "FREET4" in the last 168 hours.  BNPNo results for input(s): "BNP", "PROBNP" in the last 168 hours.  DDimer No results for input(s): "DDIMER" in the last 168 hours.   Radiology    DG Chest 2 View Result Date: 10/06/2022 CLINICAL DATA:  Pacemaker placement EXAM: CHEST - 2 VIEW COMPARISON:  09/30/2022 FINDINGS: New LEFT subclavian sequential pacemaker with  leads at RIGHT atrium and RIGHT ventricle. Normal heart size post CABG and TAVR. Mediastinal contours and pulmonary vascularity normal. Atherosclerotic calcification aorta. Minimal scarring at lingula. Lungs otherwise clear. No infiltrate, pleural effusion, or pneumothorax. Osseous structures unremarkable. IMPRESSION: New LEFT subclavian pacemaker as above. No acute abnormalities. Electronically Signed   By: Lavonia Dana M.D.   On: 10/06/2022 08:36    Cardiac Studies    10/05/22: TTE 1. 26 mm S3. Vmax 1.6 m/s, MG 5.3 mmHG, EOA 2.87 cm2, DI 0.58. No  regurgitation or paravalvular leak. The aortic valve has been  repaired/replaced. Aortic valve regurgitation is not visualized. There is  a 26 mm Sapien prosthetic (TAVR) valve present in  the aortic position. Procedure Date: 10/04/2022. Echo findings are  consistent with normal structure and function of the aortic valve  prosthesis.   2. Apical movement consistent with RV pacing. Left ventricular ejection  fraction, by estimation, is 55 to 60%. The left ventricle has normal  function. The left ventricle has no regional wall motion abnormalities.  There is moderate asymmetric left  ventricular hypertrophy of the basal-septal segment. Left ventricular  diastolic parameters are consistent with Grade I diastolic dysfunction  (impaired relaxation).   3. Right ventricular systolic function is normal. The right ventricular  size is normal. Tricuspid regurgitation signal is inadequate for assessing  PA pressure.   4. The mitral valve is grossly normal. Trivial mitral valve  regurgitation. No evidence of mitral stenosis.   5. The inferior vena cava is normal in size with greater than 50%  respiratory variability, suggesting right atrial pressure of 3 mmHg.   Comparison(s): No significant change from prior study.    10/04/22: limited echo  1.  Echo guided TAVR. Vmax 0.9 m/s, MG 2.0 mmHG, EOA 3.9 cm2, DI 0.79. No  regurgitation or paravalvular leak.  There is a 26 mm Sapien prosthetic  (TAVR) valve present in the aortic position. Procedure Date: 10/04/2022.   2. Left ventricular ejection fraction, by estimation, is 60 to 65%. The  left ventricle has normal function. There is moderate concentric left  ventricular hypertrophy.   3. Right ventricular systolic function is normal. The right ventricular  size is normal.   4. The mitral valve is grossly normal. Trivial mitral valve  regurgitation. No evidence of mitral stenosis.    08/08/22: TTE 1. Left ventricular ejection fraction, by estimation, is 65 to 70%. The  left ventricle has normal function. The left ventricle has no regional  wall motion abnormalities. Left ventricular diastolic parameters were  normal.   2. Right ventricular systolic function is normal. The right ventricular  size is normal. There is normal pulmonary artery systolic pressure.   3. The mitral valve is normal in structure. Trivial mitral valve  regurgitation. No evidence of mitral stenosis.   4. The inferior vena cava is normal in size with greater than 50%  respiratory variability, suggesting right atrial pressure of 3 mmHg.   5. The aortic valve is calcified. There is severe calcifcation of the  aortic valve. Aortic valve regurgitation is trivial. Severe aortic valve  stenosis. Vmax 4.3 m/s, MG 44 mmHg, AVA 0.8 cm^2, DI 0.26      04/01/22: LHC/PCI Ost LAD to Prox LAD lesion is 100% stenosed.  LIMA to LAD is patent.   Dist LAD lesion is 70% stenosed.  Diffuse disease noted in the native LAD.   Prox RCA to Mid RCA lesion is 100% stenosed.  SVG to PDA is patent.   Dist RCA lesion is 40% stenosed.   Ost RPDA to RPDA lesion is 40% stenosed.   Ost Cx to Prox Cx lesion is 99% stenosed.   SVG to OM is patent.  There is a Dist Graft to Insertion lesion is 80% stenosed.   Scoring balloon angioplasty was performed using a 1.75 mm score flex, followed by a BALL SAPPHIRE NC24 2.50X10.   Post intervention, there is a  10% residual stenosis.   There is moderate aortic valve stenosis.  Mean gradient 37 mmHg   Continue aggressive secondary prevention.  Dual antiplatelet therapy going forward.   Area of disease was distal in the graft and there is difficulty in delivering equipment as well.  I do not think an additional stent would be beneficial.  Could consider drug-eluting balloon if available in the event that he has further restenosis.    Patient Profile     76 y.o. male with a hx of CAD (prior CABG/PCIs post CABG, most recently PCI to SVG to OM June 2023), PVD (R CFA endarterectomy), HTN, HLD, DM, VHD w/severe AS admitted for TAVR, developed CHB  Assessment & Plan    CHB Post TAVR No evidence of any recovery of conduction this AM Post TAVR EF yesterday 60-65% LBBB escape   S/p PPM yesterday with Dr. Curt Bears Site is stable Wound care and activity restrictions were reviewed with the patient EP follow up is in place CXR this Am with stable lead position and no PTX Device check this Am with stable measurements  Dr. Tera Helper zhas seen and examined the patient this AM OK to resume betablocker  OK to resume plavix today OK to discharge from EP perspective when medically ready otherwise  VHD S/p TAVR POD #2 today     CAD CABG and multiple PCIs Most recently June 2023 to SVG to OM OK to resume plavix today    For questions or updates, please contact [place] Please consult [email or web address] for contact info under        Signed, Baldwin Jamaica, PA-C  10/06/2022, 8:58 AM    I have seen and examined this patient with Tommye Standard.  Agree with above, note added to reflect my findings.  On exam, RRR, no murmurs, lungs clear.  She is now status post [employer] pacemaker for complete AV block post TAVR.  Device functioning appropriately.  Chest x-ray and interrogation without issue.  OK for discharge today with follow-up in device clinic.  Joshalyn Ancheta M. Alianna Wurster MD 10/06/2022 9:44  AM

## 2022-10-07 ENCOUNTER — Telehealth (HOSPITAL_COMMUNITY): Payer: Self-pay

## 2022-10-07 ENCOUNTER — Telehealth: Payer: Self-pay

## 2022-10-07 NOTE — Telephone Encounter (Signed)
Called and spoke with pt in regards to CR, pt stated he is not interested at this time.   Closed referral

## 2022-10-07 NOTE — Telephone Encounter (Signed)
Patient contacted regarding discharge from [facility] on 10/06/2022.  Patient understands to follow up with Structural Heart APP on 10/12/2022 at 3:00 PM at [facility] location. Patient understands discharge instructions? yes Patient understands medications and regiment? yes Patient understands to bring all medications to this visit? Yes  Overall the pt is doing well and only complains about soreness at pacemaker site.

## 2022-10-10 ENCOUNTER — Other Ambulatory Visit: Payer: Self-pay | Admitting: Student

## 2022-10-11 NOTE — Progress Notes (Signed)
[facility]   MULTIDISCIPLINARY HEART VALVE CLINIC                                     Cardiology Office Note:    Date:  10/13/2022   ID:  [name] [name], DOB 02-03-1946, MRN [id number]  PCP:  Jarrett Soho, PA-C  [facility] Cardiologist:  Tonny Bollman, MD  [facility] Electrophysiologist:  None   Referring MD: Jarrett Soho, PA-C   [facility] s/p TAVR  History of Present Illness:    [name] [name] is a 76 y.o. 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  He has undergone CABG and PCI procedures after bypass surgery.  He has had multiple PCI procedures over the past few years.  The patient underwent right common femoral endarterectomy in 2022 to treat severe common femoral artery stenosis and right leg claudication.  He underwent PCI in November 2022 and June 2023 with treatment of the saphenous vein graft to obtuse marginal for treatment of progressive anginal chest pain. The patient has been followed for moderate aortic stenosis. Surveillance echo on 08/08/22 showed progression from moderate AS to severe AS with severe aortic valve calcification, AVA 0.8 cm2, mean gradient 44.0 mmHg, and peak gradient 75.0 mmHg.  He continued to have anginal chest pain and progressive DOE and despite maximal medical therapy.   The patient has been evaluated by the multidisciplinary valve team and underwent successful TAVR with a 26 mm Edwards Sapien 3 Ultra Resilia THV via the TF approach on 10/04/22. 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 Elberta Fortis. Post operative showed EF 55%, normally functioning TAVR with a mean gradient of 5.3 mmHg and no PVL. He was continued on  chronic DAPT.  Today the patient presents to clinic for follow up. No CP or SOB. No LE edema, orthopnea or PND. No dizziness or syncope. No blood in stool or urine. No palpitations. Hasn't been walking much. 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 Hopeful his chest pain will improve s/p TAVR. He has not been taking plavix due to pacer (misunderstood directions) but will resume today.   Past Surgical History:  Procedure Laterality Date   ABDOMINAL AORTOGRAM W/LOWER EXTREMITY N/A 12/30/2020   Procedure: ABDOMINAL AORTOGRAM W/LOWER EXTREMITY;  Surgeon: Iran Ouch, MD;  Location: [facility] INVASIVE CV LAB;  Service: Cardiovascular;  Laterality: N/A;   ADENOIDECTOMY     CARDIAC CATHETERIZATION N/A 07/18/2016   Procedure: Left Heart Cath and Cors/Grafts Angiography;  Surgeon: Kathleene Hazel, MD;  Location: [facility] INVASIVE CV LAB;  Service: Cardiovascular;  Laterality: N/A;   CARDIAC CATHETERIZATION N/A 07/18/2016   Procedure: Coronary Stent Intervention;  Surgeon: Kathleene Hazel, MD;  Location: [facility] INVASIVE CV LAB;  Service: Cardiovascular;  Laterality: N/A;   CHOLECYSTECTOMY     CORONARY ANGIOPLASTY WITH STENT PLACEMENT     CORONARY ARTERY BYPASS GRAFT     CORONARY BALLOON ANGIOPLASTY N/A 04/01/2022   Procedure: CORONARY BALLOON ANGIOPLASTY;  Surgeon: Corky Crafts, MD;  Location: [facility] INVASIVE CV LAB;  Service: Cardiovascular;  Laterality: N/A;  SVG-OM1  CORONARY BALLOON ANGIOPLASTY N/A 09/10/2021   Procedure: CORONARY BALLOON ANGIOPLASTY;  Surgeon: Tonny Bollman, MD;  Location: [facility] INVASIVE CV LAB;  Service: Cardiovascular;  Laterality: N/A;   CORONARY STENT INTERVENTION N/A 01/11/2019   Procedure: CORONARY STENT INTERVENTION;  Surgeon: Tonny Bollman, MD;  Location: [facility] INVASIVE CV LAB;  Service: Cardiovascular;  Laterality: N/A;   ENDARTERECTOMY FEMORAL Right 01/13/2021   Procedure: RIGHT FEMORAL ENDARTERECTOMY  WITH PATCH ANGIOPLASTY;  Surgeon: Larina Earthly, MD;  Location: [facility]  OR;  Service: Vascular;  Laterality: Right;   INGUINAL HERNIA REPAIR Right    INTRAOPERATIVE TRANSTHORACIC ECHOCARDIOGRAM N/A 10/04/2022   Procedure: INTRAOPERATIVE TRANSTHORACIC ECHOCARDIOGRAM;  Surgeon: Tonny Bollman, MD;  Location: [facility] OR;  Service: Open Heart Surgery;  Laterality: N/A;   LEFT HEART CATH AND CORS/GRAFTS ANGIOGRAPHY N/A 01/11/2019   Procedure: LEFT HEART CATH AND CORS/GRAFTS ANGIOGRAPHY;  Surgeon: Tonny Bollman, MD;  Location: [facility] INVASIVE CV LAB;  Service: Cardiovascular;  Laterality: N/A;   LEFT HEART CATH AND CORS/GRAFTS ANGIOGRAPHY N/A 04/01/2022   Procedure: LEFT HEART CATH AND CORS/GRAFTS ANGIOGRAPHY;  Surgeon: Corky Crafts, MD;  Location: [facility] INVASIVE CV LAB;  Service: Cardiovascular;  Laterality: N/A;   LEFT HEART CATH AND CORS/GRAFTS ANGIOGRAPHY N/A 09/10/2021   Procedure: LEFT HEART CATH AND CORS/GRAFTS ANGIOGRAPHY;  Surgeon: Tonny Bollman, MD;  Location: [facility] INVASIVE CV LAB;  Service: Cardiovascular;  Laterality: N/A;   PACEMAKER IMPLANT N/A 10/05/2022   Procedure: PACEMAKER IMPLANT;  Surgeon: Regan Lemming, MD;  Location: [facility] INVASIVE CV LAB;  Service: Cardiovascular;  Laterality: N/A;   Right femoral endarterectomy and Dacron patch angioplasty  01/13/3021   TONSILLECTOMY     TRANSCATHETER AORTIC VALVE REPLACEMENT, TRANSFEMORAL Left 10/04/2022   Procedure: Transcatheter Aortic Valve Replacement, Transfemoral;  Surgeon: Tonny Bollman, MD;  Location: [facility] OR;  Service: Open Heart Surgery;  Laterality: Left;    Current Medications: Current Meds  Medication Sig   isosorbide mononitrate (IMDUR) 120 MG 24 hr tablet Take 60 mg by mouth in the morning and at bedtime.     Allergies:   Empagliflozin   Social History   Socioeconomic History   Marital status: Married    Spouse name: Not on file   Number of children: Not on file   Years of education: Not on file   Highest education level: Not on file  Occupational History   Not on file  Tobacco Use    Smoking status: Never   Smokeless tobacco: Never  Vaping Use   Vaping Use: Never used  Substance and Sexual Activity   Alcohol use: No   Drug use: No   Sexual activity: Not on file  Other Topics Concern   Not on file  Social History Narrative   Not on file   Social Determinants of Health   Financial Resource Strain: Not on file  Food Insecurity: No Food Insecurity (10/04/2022)   Hunger Vital Sign    Worried About Running Out of Food in the Last Year: Never true    Ran Out of Food in the Last Year: Never true  Transportation Needs: No Transportation Needs (10/04/2022)   PRAPARE - Administrator, Civil Service (Medical): No    Lack of Transportation (Non-Medical): No  Physical Activity: Not on file  Stress: Not on file  Social Connections: Not on file     Family History: The patient's family history includes Heart disease in his mother. There is no history of Diabetes.  ROS:   Please see the history  of present illness.    All other systems reviewed and are negative.  EKGs/Labs/Other Studies Reviewed:    The following studies were reviewed today:  TAVR OPERATIVE NOTE     Date of Procedure:                10/04/2022   Preoperative Diagnosis:      Severe Aortic Stenosis    Postoperative Diagnosis:    Same    Procedure:        Transcatheter Aortic Valve Replacement - Percutaneous Left Transfemoral Approach             Edwards Sapien 3 Ultra THV (size 26 mm, model # 9755RSL, serial # [id number])              Co-Surgeons:                        Eugenio Hoes MD and Tonny Bollman, MD      Anesthesiologist:                  Leslye Peer MD    Echocardiographer:              Van Clines MD   Pre-operative Echo Findings: Severe aortic stenosis normal left ventricular systolic function   Post-operative Echo Findings: No  paravalvular leak normal left ventricular systolic function _____________     Echo 10/05/22: IMPRESSIONS   1. 26 mm S3. Vmax 1.6  m/s, MG 5.3 mmHG, EOA 2.87 cm2, DI 0.58. No  regurgitation or paravalvular leak. The aortic valve has been  repaired/replaced. Aortic valve regurgitation is not visualized. There is  a 26 mm Sapien prosthetic (TAVR) valve present in  the aortic position. Procedure Date: 10/04/2022. Echo findings are  consistent with normal structure and function of the aortic valve  prosthesis.   2. Apical movement consistent with RV pacing. Left ventricular ejection  fraction, by estimation, is 55 to 60%. The left ventricle has normal  function. The left ventricle has no regional wall motion abnormalities.  There is moderate asymmetric left  ventricular hypertrophy of the basal-septal segment. Left ventricular  diastolic parameters are consistent with Grade I diastolic dysfunction  (impaired relaxation).   3. Right ventricular systolic function is normal. The right ventricular  size is normal. Tricuspid regurgitation signal is inadequate for assessing  PA pressure.   4. The mitral valve is grossly normal. Trivial mitral valve  regurgitation. No evidence of mitral stenosis.   5. The inferior vena cava is normal in size with greater than 50%  respiratory variability, suggesting right atrial pressure of 3 mmHg.   Comparison(s): No significant change from prior study.      ________________________   10/05/22 PACEMAKER IMPLANT    Conclusion   SURGEON:  Will Jorja Loa, MD     PREPROCEDURE DIAGNOSIS:  complete AV block    POSTPROCEDURE DIAGNOSIS:  complete AV block     PROCEDURES:   1.  Pacemaker implantation.        CONCLUSIONS:   1. Successful implantation of a Medtronic Azure XT DR MRI SureScan dual-chamber pacemaker for symptomatic bradycardia  2. No early apparent complications.   EKG:  EKG is NOT ordered today  Recent Labs: 09/30/2022: ALT 20 10/05/2022: Magnesium 1.8 10/06/2022: BUN 12; Creatinine, Ser 1.19; Hemoglobin 12.7; Platelets 170; Potassium 4.0; Sodium 134  Recent  Lipid Panel    Component Value Date/Time   CHOL 160 07/27/2022 0909   TRIG 305 (H) 07/27/2022  8119   HDL 39 (L) 07/27/2022 0909   CHOLHDL 4.1 07/27/2022 0909   CHOLHDL 2.5 01/14/2021 0105   VLDL 26 01/14/2021 0105   LDLCALC 72 07/27/2022 0909     Risk Assessment/Calculations:       Physical Exam:    VS:  BP (!) 142/84   Pulse 87   Ht 5\' 9"  (1.753 m)   Wt 208 lb 9.6 oz (94.6 kg)   SpO2 98%   BMI 30.80 kg/m     Wt Readings from Last 3 Encounters:  10/12/22 208 lb 9.6 oz (94.6 kg)  10/06/22 206 lb 2.1 oz (93.5 kg)  09/12/22 211 lb (95.7 kg)     GEN:  Well nourished, well developed in no acute distress HEENT: Normal NECK: No JVD LYMPHATICS: No lymphadenopathy CARDIAC: RRR, no murmurs, rubs, gallops RESPIRATORY:  Clear to auscultation without rales, wheezing or rhonchi  ABDOMEN: Soft, non-tender, non-distended MUSCULOSKELETAL:  No edema; No deformity  SKIN: Warm and dry.  Groin sites clear without hematoma or ecchymosis  NEUROLOGIC:  Alert and oriented x 3 PSYCHIATRIC:  Normal affect   ASSESSMENT:    1. S/P TAVR (transcatheter aortic valve replacement)   2. S/P placement of cardiac pacemaker   3. Atherosclerosis of coronary artery bypass graft of native heart with angina pectoris (HCC)   4. PAD (peripheral artery disease) (HCC)   5. Hypertension, unspecified type   6. Hyperlipidemia, unspecified hyperlipidemia type    PLAN:    In order of problems listed above:  Severe AS s/p TAVR: doing well 1 week out from TAVR. Groin sites healing well. SBE prophylaxis discussed; he wants to hold off on calling anything in right now given the amount of medications he has. Continue chronic DAPT. We will see him back for 1 month follow up and echo.   CHB s/p PPM: wound check 1/4. Continue follow up with device clinic   CAD: s/p CABG in 2009 and subsequent multiple PCI in 08/2021 and 03/2022. Per last cath note, "area of disease was distal in the graft and there is difficulty  in delivering equipment as well. I do not think an additional stent would be beneficial. Could consider drug-eluting balloon if available in the event that he has further restenosis." Continue medical therapy. He has chronic angina and hopeful CP will improve s/p TAVR. 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 Continue antianginal regimen.    PAD: s/p rt fem endart in 2022. Vascular follows for PAD and carotid stenosis. Continue medical management    HTN: moderately elevated today but he reports not taking his medications this AM. He will take when he gets home.    HLD: continue crestor   Medication Adjustments/Labs and Tests Ordered: Current medicines are reviewed at length with the patient today.  Concerns regarding medicines are outlined above.  No orders of the defined types were placed in this encounter.  No orders of the defined types were placed in this encounter.   Patient Instructions  Medication Instructions:  Your physician recommends that you continue on your current medications as directed. Please refer to the Current Medication list given to you today.  *If you need a refill on your cardiac medications before your next appointment, please call your pharmacy*   Follow-Up: At [facility], you and your health needs are our priority.  As part of our continuing mission to provide you with exceptional heart care, we have created designated Provider  Care Teams.  These Care Teams include your primary Cardiologist (physician) and Advanced Practice Providers (APPs -  Physician Assistants and Nurse Practitioners) who all work together to provide you with the care you need, when you need it.   Your next appointment:   As scheduled   Important Information About Sugar         Signed, Cline Crock, PA-C  10/13/2022 10:35 AM    [facility]

## 2022-10-12 ENCOUNTER — Ambulatory Visit: Payer: Medicare Other | Attending: Physician Assistant | Admitting: Physician Assistant

## 2022-10-12 VITALS — BP 142/84 | HR 87 | Ht 69.0 in | Wt 208.6 lb

## 2022-10-12 DIAGNOSIS — I10 Essential (primary) hypertension: Secondary | ICD-10-CM | POA: Insufficient documentation

## 2022-10-12 DIAGNOSIS — I739 Peripheral vascular disease, unspecified: Secondary | ICD-10-CM | POA: Diagnosis not present

## 2022-10-12 DIAGNOSIS — I25709 Atherosclerosis of coronary artery bypass graft(s), unspecified, with unspecified angina pectoris: Secondary | ICD-10-CM | POA: Insufficient documentation

## 2022-10-12 DIAGNOSIS — E785 Hyperlipidemia, unspecified: Secondary | ICD-10-CM | POA: Insufficient documentation

## 2022-10-12 DIAGNOSIS — I200 Unstable angina: Secondary | ICD-10-CM

## 2022-10-12 DIAGNOSIS — Z950 Presence of cardiac pacemaker: Secondary | ICD-10-CM | POA: Diagnosis not present

## 2022-10-12 DIAGNOSIS — Z952 Presence of prosthetic heart valve: Secondary | ICD-10-CM | POA: Diagnosis not present

## 2022-10-12 NOTE — Patient Instructions (Signed)
Medication Instructions:  Your physician recommends that you continue on your current medications as directed. Please refer to the Current Medication list given to you today.  *If you need a refill on your cardiac medications before your next appointment, please call your pharmacy*   Follow-Up: At [facility], you and your health needs are our priority.  As part of our continuing mission to provide you with exceptional heart care, we have created designated Provider Care Teams.  These Care Teams include your primary Cardiologist (physician) and Advanced Practice Providers (APPs -  Physician Assistants and Nurse Practitioners) who all work together to provide you with the care you need, when you need it.   Your next appointment:   As scheduled   Important Information About Sugar

## 2022-10-20 ENCOUNTER — Ambulatory Visit: Payer: Medicare Other | Attending: Internal Medicine

## 2022-10-20 DIAGNOSIS — I442 Atrioventricular block, complete: Secondary | ICD-10-CM | POA: Diagnosis not present

## 2022-10-20 NOTE — Patient Instructions (Addendum)
   After Your Pacemaker   Monitor your pacemaker site for redness, swelling, and drainage. Call the device clinic at [phone] if you experience these symptoms or fever/chills.  Your incision was closed with Steri-strips or staples:  You may shower 7 days after your procedure and wash your incision with soap and water. Avoid lotions, ointments, or perfumes over your incision until it is well-healed.  You may use a hot tub or a pool after your wound check appointment if the incision is completely closed.  Do not lift, push or pull greater than 10 pounds with the affected arm until 6 weeks after your procedure. There are no other restrictions in arm movement after your wound check appointment. UNTIL AFTER FEBRUARY 2ND  You may drive, unless driving has been restricted by your healthcare providers.   Remote monitoring is used to monitor your pacemaker from home. This monitoring is scheduled every 91 days by our office. It allows [place] to keep an eye on the functioning of your device to ensure it is working properly. You will routinely see your Electrophysiologist annually (more often if necessary).

## 2022-10-21 LAB — CUP PACEART INCLINIC DEVICE CHECK
Battery Remaining Longevity: 148 mo
Battery Voltage: 3.22 V
Brady Statistic AP VP Percent: 7.4 %
Brady Statistic AP VS Percent: 0 %
Brady Statistic AS VP Percent: 92.26 %
Brady Statistic AS VS Percent: 0.34 %
Brady Statistic RA Percent Paced: 7.57 %
Brady Statistic RV Percent Paced: 99.66 %
Date Time Interrogation Session: 20240104095000
Implantable Lead Connection Status: 753985
Implantable Lead Connection Status: 753985
Implantable Lead Implant Date: 20231220
Implantable Lead Implant Date: 20231220
Implantable Lead Location: 753859
Implantable Lead Location: 753860
Implantable Lead Model: 3830
Implantable Lead Model: 5076
Implantable Pulse Generator Implant Date: 20231220
Lead Channel Impedance Value: 323 Ohm
Lead Channel Impedance Value: 418 Ohm
Lead Channel Impedance Value: 456 Ohm
Lead Channel Impedance Value: 722 Ohm
Lead Channel Pacing Threshold Amplitude: 0.625 V
Lead Channel Pacing Threshold Amplitude: 0.625 V
Lead Channel Pacing Threshold Amplitude: 0.75 V
Lead Channel Pacing Threshold Amplitude: 0.75 V
Lead Channel Pacing Threshold Pulse Width: 0.4 ms
Lead Channel Pacing Threshold Pulse Width: 0.4 ms
Lead Channel Pacing Threshold Pulse Width: 0.4 ms
Lead Channel Pacing Threshold Pulse Width: 0.4 ms
Lead Channel Sensing Intrinsic Amplitude: 3.875 mV
Lead Channel Sensing Intrinsic Amplitude: 4.625 mV
Lead Channel Sensing Intrinsic Amplitude: 6.25 mV
Lead Channel Setting Pacing Amplitude: 3.5 V
Lead Channel Setting Pacing Amplitude: 3.5 V
Lead Channel Setting Pacing Pulse Width: 0.4 ms
Lead Channel Setting Sensing Sensitivity: 1.2 mV
Zone Setting Status: 755011
Zone Setting Status: 755011

## 2022-10-21 NOTE — Progress Notes (Signed)

## 2022-11-01 DIAGNOSIS — E785 Hyperlipidemia, unspecified: Secondary | ICD-10-CM

## 2022-11-01 NOTE — Progress Notes (Signed)
Letter mailed with lab slips

## 2022-11-02 ENCOUNTER — Other Ambulatory Visit: Payer: Self-pay | Admitting: Cardiovascular Disease

## 2022-11-08 NOTE — Progress Notes (Unsigned)
[facility] [place]                                     Cardiology Office Note:    Date:  11/09/2022   ID:  [name] [name], DOB Mar 15, 1946, MRN [id number]  PCP:  Marda Stalker, PA-C  [facility] Cardiologist:  Sherren Mocha, MD  [place] Electrophysiologist:  None   Referring MD: Marda Stalker, PA-C   Chief Complaint  Patient presents with   Follow-up    1 month s/p TAVR   History of Present Illness:    [name] [name] is a 77 y.o. 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   [name] [name] underwent CABG in 2009 and has had multiple PCI procedures over the past few years. He underwent right common femoral endarterectomy in 2022 to treat severe common femoral artery stenosis and right leg claudication. He had a PCI in 08/2021 and 03/2022 with treatment of the saphenous vein graft to obtuse marginal for treatment given progressive chest pain. He has been followed for moderate aortic stenosis. Surveillance echo on 08/08/22 showed progression from moderate AS to severe AS with severe aortic valve calcification, AVA 0.8 cm2, mean gradient 44.0 mmHg, and peak gradient 75.0 mmHg.  He continued to have anginal chest pain and progressive DOE and despite maximal medical therapy.   He was then evaluated by the multidisciplinary valve team and underwent successful TAVR with a 26 mm Edwards Sapien 3 Ultra Resilia THV via the TF approach on 10/04/22. 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 Curt Bears. Post operative showed EF 55%, normally functioning TAVR with a mean gradient of 5.3 mmHg and no PVL. He was continued on chronic  DAPT.  In TOC follow up, he was doing well with no complaints. Today he is here alone and states that he continues to do well since his procedure. His chest pain has improved although had an episode while moving food from his deep freezer when it quite working. He states that the pain went away on its own without the use of NTG. He continues to work as a Presenter, broadcasting and has no issues with activity such as walking and driving. The chest pain was brief and has not recurred. He has been compliant with DAPT. Otherwise he denies chest pain, palpitations, LE edema, SOB, orthopnea, dizziness, or syncope.   Past Medical History:  Diagnosis Date   Coronary atherosclerosis of artery bypass graft    CABG 2009, LHC 07/2016 restenosis of SVG to OM treated with DES   Diabetes mellitus ([place])    Essential hypertension, benign    Fatty liver    History of kidney stones    HTN (hypertension)    PAD (peripheral artery disease) ([place])    Pure hypercholesterolemia    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 [name] & Dr. Cyndia Bent   Severe aortic stenosis     Past Surgical History:  Procedure Laterality Date  ABDOMINAL AORTOGRAM W/LOWER EXTREMITY N/A 12/30/2020   Procedure: ABDOMINAL AORTOGRAM W/LOWER EXTREMITY;  Surgeon: Wellington Hampshire, MD;  Location: [place] [facility];  Service: Cardiovascular;  Laterality: N/A;   ADENOIDECTOMY     CARDIAC CATHETERIZATION N/A 07/18/2016   Procedure: Left Heart Cath and Cors/Grafts Angiography;  Surgeon: Burnell Blanks, MD;  Location: [place] [facility];  Service: Cardiovascular;  Laterality: N/A;   CARDIAC CATHETERIZATION N/A 07/18/2016   Procedure: Coronary Stent Intervention;  Surgeon: Burnell Blanks, MD;  Location: [place] [facility];  Service: Cardiovascular;  Laterality: N/A;   CHOLECYSTECTOMY     CORONARY ANGIOPLASTY WITH STENT PLACEMENT     CORONARY ARTERY BYPASS GRAFT      CORONARY BALLOON ANGIOPLASTY N/A 04/01/2022   Procedure: CORONARY BALLOON ANGIOPLASTY;  Surgeon: Jettie Booze, MD;  Location: [place] [facility];  Service: Cardiovascular;  Laterality: N/A;  SVG-OM1   CORONARY BALLOON ANGIOPLASTY N/A 09/10/2021   Procedure: CORONARY BALLOON ANGIOPLASTY;  Surgeon: Sherren Mocha, MD;  Location: [place] [facility];  Service: Cardiovascular;  Laterality: N/A;   CORONARY STENT INTERVENTION N/A 01/11/2019   Procedure: CORONARY STENT INTERVENTION;  Surgeon: Sherren Mocha, MD;  Location: [facility];  Service: Cardiovascular;  Laterality: N/A;   ENDARTERECTOMY FEMORAL Right 01/13/2021   Procedure: RIGHT FEMORAL ENDARTERECTOMY  WITH PATCH ANGIOPLASTY;  Surgeon: Rosetta Posner, MD;  Location: [place];  Service: Vascular;  Laterality: Right;   INGUINAL HERNIA REPAIR Right    INTRAOPERATIVE TRANSTHORACIC ECHOCARDIOGRAM N/A 10/04/2022   Procedure: INTRAOPERATIVE TRANSTHORACIC ECHOCARDIOGRAM;  Surgeon: Sherren Mocha, MD;  Location: [place];  Service: Open Heart Surgery;  Laterality: N/A;   LEFT HEART CATH AND CORS/GRAFTS ANGIOGRAPHY N/A 01/11/2019   Procedure: LEFT HEART CATH AND CORS/GRAFTS ANGIOGRAPHY;  Surgeon: Sherren Mocha, MD;  Location: [place] [facility];  Service: Cardiovascular;  Laterality: N/A;   LEFT HEART CATH AND CORS/GRAFTS ANGIOGRAPHY N/A 04/01/2022   Procedure: LEFT HEART CATH AND CORS/GRAFTS ANGIOGRAPHY;  Surgeon: Jettie Booze, MD;  Location: [place] [facility];  Service: Cardiovascular;  Laterality: N/A;   LEFT HEART CATH AND CORS/GRAFTS ANGIOGRAPHY N/A 09/10/2021   Procedure: LEFT HEART CATH AND CORS/GRAFTS ANGIOGRAPHY;  Surgeon: Sherren Mocha, MD;  Location: [place] [facility];  Service: Cardiovascular;  Laterality: N/A;   PACEMAKER IMPLANT N/A 10/05/2022   Procedure: PACEMAKER IMPLANT;  Surgeon: Constance Haw, MD;  Location: [place];  Service: Cardiovascular;  Laterality: N/A;   Right femoral endarterectomy  and Dacron patch angioplasty  01/13/3021   TONSILLECTOMY     TRANSCATHETER AORTIC VALVE REPLACEMENT, TRANSFEMORAL Left 10/04/2022   Procedure: Transcatheter Aortic Valve Replacement, Transfemoral;  Surgeon: Sherren Mocha, MD;  Location: [place];  Service: Open Heart Surgery;  Laterality: Left;    Current Medications: Current Meds  Medication Sig   amLODipine (NORVASC) 10 MG tablet Take 10 mg by mouth daily.   amoxicillin (AMOXIL) 500 MG tablet Take 4 tablets by mouth 1 hour prior to dental procedures and cleanings.   aspirin 81 MG EC tablet TAKE 1 TABLET BY MOUTH EVERY DAY   benazepril (LOTENSIN) 40 MG tablet Take 40 mg by mouth daily.    clopidogrel (PLAVIX) 75 MG tablet TAKE 1 TABLET BY MOUTH EVERY DAY   finasteride (PROSCAR) 5 MG tablet Take 5 mg by mouth daily.   glucose blood (PRECISION QID TEST) test strip 1 each by Other route as directed.   hydrochlorothiazide (HYDRODIURIL) 25 MG tablet Take 1 tablet (25 mg total)  by mouth daily.   Insulin Pen Needle (B-D UF III MINI PEN NEEDLES) 31G X 5 MM MISC Use to inject Ozempic once a week   isosorbide mononitrate (IMDUR) 120 MG 24 hr tablet Take 60 mg by mouth in the morning and at bedtime.   metFORMIN (GLUCOPHAGE) 1000 MG tablet Take 1,000 mg by mouth 2 (two) times daily with a meal.   Metoprolol Tartrate 75 MG TABS TAKE 1 TABLET BY MOUTH 2 (TWO) TIMES DAILY.   Multiple Vitamin (MULTIVITAMIN WITH MINERALS) TABS tablet Take 1 tablet by mouth daily.   nitroGLYCERIN (NITROSTAT) 0.4 MG SL tablet DISSOLVE 1 TABLET UNDER THE TONGUE EVERY 5 MINUTES AS NEEDED FOR CHEST PAIN. MAX OF 3 DOSES, THEN 911.   NOVOLIN N FLEXPEN 100 UNIT/ML FlexPen Inject 15 Units into the skin 2 (two) times daily.   ranolazine (RANEXA) 500 MG 12 hr tablet Take 500 mg by mouth 2 (two) times daily.   rosuvastatin (CRESTOR) 40 MG tablet Take 1 tablet (40 mg total) by mouth daily. (Patient taking differently: Take 20 mg by mouth daily.)     Allergies:   Empagliflozin    Social History   Socioeconomic History   Marital status: Married    Spouse name: Not on file   Number of children: Not on file   Years of education: Not on file   Highest education level: Not on file  Occupational History   Not on file  Tobacco Use   Smoking status: Never   Smokeless tobacco: Never  Vaping Use   Vaping Use: Never used  Substance and Sexual Activity   Alcohol use: No   Drug use: No   Sexual activity: Not on file  Other Topics Concern   Not on file  Social History Narrative   Not on file   Social Determinants of Health   Financial Resource Strain: Not on file  Food Insecurity: No Food Insecurity (10/04/2022)   Hunger Vital Sign    Worried About Running Out of Food in the Last Year: Never true    Ran Out of Food in the Last Year: Never true  Transportation Needs: No Transportation Needs (10/04/2022)   PRAPARE - Hydrologist (Medical): No    Lack of Transportation (Non-Medical): No  Physical Activity: Not on file  Stress: Not on file  Social Connections: Not on file     Family History: The patient's family history includes Heart disease in his mother. There is no history of Diabetes.  ROS:   Please see the history of present illness.    All other systems reviewed and are negative.  EKGs/Labs/Other Studies Reviewed:    The following studies were reviewed today:  Echocardiogram 11/09/22:   1. Left ventricular ejection fraction, by estimation, is 60 to 65%. The  left ventricle has normal function. The left ventricle has no regional  wall motion abnormalities. There is moderate concentric left ventricular  hypertrophy. Left ventricular  diastolic parameters are indeterminate.   2. Right ventricular systolic function is normal. The right ventricular  size is normal.   3. The mitral valve is normal in structure. Trivial mitral valve  regurgitation. No evidence of mitral stenosis.   4. The aortic valve has been  repaired/replaced. Aortic valve  regurgitation is not visualized. No aortic stenosis is present. There is a  26 mm Edwards valve present in the aortic position. Procedure Date:  10/04/2022. Aortic valve mean gradient measures  6.0 mmHg. Aortic valve Vmax measures  1.69 m/s.   5. The inferior vena cava is normal in size with greater than 50%  respiratory variability, suggesting right atrial pressure of 3 mmHg.    TAVR OPERATIVE NOTE     Date of Procedure:                10/04/2022   Preoperative Diagnosis:      Severe Aortic Stenosis    Postoperative Diagnosis:    Same    Procedure:        Transcatheter Aortic Valve Replacement - Percutaneous Left Transfemoral Approach             Edwards Sapien 3 Ultra THV (size 26 mm, model # 9755RSL, serial # [id number])              Co-Surgeons:                        Coralie Common MD and Sherren Mocha, MD      Anesthesiologist:                  Renold Don MD    Echocardiographer:              Tish Men MD   Pre-operative Echo Findings: Severe aortic stenosis normal left ventricular systolic function   Post-operative Echo Findings: No  paravalvular leak normal left ventricular systolic function _____________     Echo 10/05/22: IMPRESSIONS   1. 26 mm S3. Vmax 1.6 m/s, MG 5.3 mmHG, EOA 2.87 cm2, DI 0.58. No  regurgitation or paravalvular leak. The aortic valve has been  repaired/replaced. Aortic valve regurgitation is not visualized. There is  a 26 mm Sapien prosthetic (TAVR) valve present in  the aortic position. Procedure Date: 10/04/2022. Echo findings are  consistent with normal structure and function of the aortic valve  prosthesis.   2. Apical movement consistent with RV pacing. Left ventricular ejection  fraction, by estimation, is 55 to 60%. The left ventricle has normal  function. The left ventricle has no regional wall motion abnormalities.  There is moderate asymmetric left  ventricular hypertrophy of the basal-septal  segment. Left ventricular  diastolic parameters are consistent with Grade I diastolic dysfunction  (impaired relaxation).   3. Right ventricular systolic function is normal. The right ventricular  size is normal. Tricuspid regurgitation signal is inadequate for assessing  PA pressure.   4. The mitral valve is grossly normal. Trivial mitral valve  regurgitation. No evidence of mitral stenosis.   5. The inferior vena cava is normal in size with greater than 50%  respiratory variability, suggesting right atrial pressure of 3 mmHg.   Comparison(s): No significant change from prior study.   ________________________   10/05/22 PACEMAKER IMPLANT    Conclusion   SURGEON:  Will Meredith Leeds, MD     PREPROCEDURE DIAGNOSIS:  complete AV block    POSTPROCEDURE DIAGNOSIS:  complete AV block     PROCEDURES:   1.  Pacemaker implantation.        CONCLUSIONS:   1. Successful implantation of a Medtronic Azure XT DR MRI SureScan dual-chamber pacemaker for symptomatic bradycardia  2. No early apparent complications.   EKG:  EKG is not ordered today.    Recent Labs: 09/30/2022: ALT 20 10/05/2022: Magnesium 1.8 10/06/2022: BUN 12; Creatinine, Ser 1.19; Hemoglobin 12.7; Platelets 170; Potassium 4.0; Sodium 134   Recent Lipid Panel    Component Value Date/Time   CHOL 160 07/27/2022 0909   TRIG 305 (  H) 07/27/2022 0909   HDL 39 (L) 07/27/2022 0909   CHOLHDL 4.1 07/27/2022 0909   CHOLHDL 2.5 01/14/2021 0105   VLDL 26 01/14/2021 0105   LDLCALC 72 07/27/2022 0909    Physical Exam:    VS:  BP 138/68   Pulse 68   Ht '5\' 9"'$  (1.753 m)   Wt 211 lb (95.7 kg)   SpO2 98%   BMI 31.16 kg/m     Wt Readings from Last 3 Encounters:  11/09/22 211 lb (95.7 kg)  10/12/22 208 lb 9.6 oz (94.6 kg)  10/06/22 206 lb 2.1 oz (93.5 kg)    General: Well developed, well nourished, NAD Cardiovascular: RRR with S1 S2. No murmurs Extremities: No edema.  Neuro: Alert and oriented. No focal deficits. No  facial asymmetry. MAE spontaneously. Psych: Responds to questions appropriately with normal affect.    ASSESSMENT/PLAN:    Severe AS s/p TAVR: Patient doing well with NYHA class I symptoms s/p TAVR. Dental SBE deferred last appointmemt and was re-dicussed today. Dental SBE with Amoxicillin sent to preferred pharmacy. Echocardiogram today shows stable valve function with mean gradient measures 6.0 mmHg. Aortic valve peak gradient measures 11.4 mmHg, AVA by VTI at 3.14 cm. Continue chronic DAPT. Plan for follow up next month with Dr. [name] as previously scheduled and [facility] with echo and OV in 09/2023.     CHB s/p PPM: Wound check performed 10/20/22 with device programming at 3.5V/auto capture programmed on for extra safety margin until 3 month visit. Histogram distribution noted with appropriate for patient and level of activity. No mode switches or high ventricular rates noted. Plan ROV in 3 months with implanting physician.    CAD: s/p CABG in 2009 and subsequent multiple PCI in 08/2021 and 03/2022. Per last cath note, "area of disease was distal in the graft and there is difficulty in delivering equipment as well. I do not think an additional stent would be beneficial. Could consider drug-eluting balloon if available in the event that he has further restenosis." Continue medical therapy. He has chronic angina which has improved after TAVR. Continue antianginal regimen.    PAD: s/p right fem endarterectomy in 2022. Vascular follows for PAD and carotid stenosis. Continue medical management    HTN: Stable with no changes needed at this time.   HLD: Continue crestor  Medication Adjustments/Labs and Tests Ordered: Current medicines are reviewed at length with the patient today.  Concerns regarding medicines are outlined above.  Orders Placed This Encounter  Procedures   ECHOCARDIOGRAM COMPLETE   Meds ordered this encounter  Medications   amoxicillin (AMOXIL) 500 MG tablet    Sig: Take 4 tablets by  mouth 1 hour prior to dental procedures and cleanings.    Dispense:  12 tablet    Refill:  6    Patient Instructions  Medication Instructions:  Start Start Amoxicillin 500 mg, take 4 tablets by mouth 1 hour prior to dental procedures and cleanings.    *If you need a refill on your cardiac medications before your next appointment, please call your pharmacy*   Lab Work: None ordered   If you have labs (blood work) drawn today and your tests are completely normal, you will receive your results only by: [place] (if you have MyChart) OR A paper copy in the mail If you have any lab test that is abnormal or we need to change your treatment, we will call you to review the results.   Testing/Procedures: Your physician has requested that  you have an echocardiogram. Echocardiography is a painless test that uses sound waves to create images of your heart. It provides your doctor with information about the size and shape of your heart and how well your heart's chambers and valves are working. This procedure takes approximately one hour. There are no restrictions for this procedure. Please do NOT wear cologne, perfume, aftershave, or lotions (deodorant is allowed). Please arrive 15 minutes prior to your appointment time.    Follow-Up: Follow up as scheduled   Other Instructions     Signed, Kathyrn Drown, NP  11/09/2022 1:53 PM    [facility]

## 2022-11-09 ENCOUNTER — Other Ambulatory Visit (HOSPITAL_COMMUNITY): Payer: Medicare Other

## 2022-11-09 ENCOUNTER — Ambulatory Visit: Payer: Medicare Other | Attending: Cardiology | Admitting: Cardiology

## 2022-11-09 ENCOUNTER — Ambulatory Visit: Payer: Medicare Other | Attending: Nurse Practitioner

## 2022-11-09 ENCOUNTER — Ambulatory Visit (HOSPITAL_BASED_OUTPATIENT_CLINIC_OR_DEPARTMENT_OTHER): Payer: Medicare Other

## 2022-11-09 VITALS — BP 138/68 | HR 68 | Ht 69.0 in | Wt 211.0 lb

## 2022-11-09 DIAGNOSIS — E7800 Pure hypercholesterolemia, unspecified: Secondary | ICD-10-CM | POA: Insufficient documentation

## 2022-11-09 DIAGNOSIS — I739 Peripheral vascular disease, unspecified: Secondary | ICD-10-CM | POA: Insufficient documentation

## 2022-11-09 DIAGNOSIS — Z950 Presence of cardiac pacemaker: Secondary | ICD-10-CM | POA: Diagnosis not present

## 2022-11-09 DIAGNOSIS — I442 Atrioventricular block, complete: Secondary | ICD-10-CM | POA: Diagnosis not present

## 2022-11-09 DIAGNOSIS — Z952 Presence of prosthetic heart valve: Secondary | ICD-10-CM | POA: Insufficient documentation

## 2022-11-09 DIAGNOSIS — I25709 Atherosclerosis of coronary artery bypass graft(s), unspecified, with unspecified angina pectoris: Secondary | ICD-10-CM | POA: Diagnosis not present

## 2022-11-09 DIAGNOSIS — I10 Essential (primary) hypertension: Secondary | ICD-10-CM | POA: Diagnosis not present

## 2022-11-09 DIAGNOSIS — I350 Nonrheumatic aortic (valve) stenosis: Secondary | ICD-10-CM | POA: Diagnosis not present

## 2022-11-09 LAB — ECHOCARDIOGRAM COMPLETE
AR max vel: 3.09 cm2
AV Area VTI: 3.14 cm2
AV Area mean vel: 3.11 cm2
AV Mean grad: 6 mmHg
AV Peak grad: 11.4 mmHg
Ao pk vel: 1.69 m/s
Area-P 1/2: 2.04 cm2
S' Lateral: 3.2 cm

## 2022-11-09 MED ORDER — AMOXICILLIN 500 MG PO TABS: ORAL_TABLET | ORAL | 6 refills | Status: AC

## 2022-11-09 NOTE — Patient Instructions (Addendum)
Medication Instructions:  Start Start Amoxicillin 500 mg, take 4 tablets by mouth 1 hour prior to dental procedures and cleanings.    *If you need a refill on your cardiac medications before your next appointment, please call your pharmacy*   Lab Work: None ordered   If you have labs (blood work) drawn today and your tests are completely normal, you will receive your results only by: [place] (if you have MyChart) OR A paper copy in the mail If you have any lab test that is abnormal or we need to change your treatment, we will call you to review the results.   Testing/Procedures: Your physician has requested that you have an echocardiogram. Echocardiography is a painless test that uses sound waves to create images of your heart. It provides your doctor with information about the size and shape of your heart and how well your heart's chambers and valves are working. This procedure takes approximately one hour. There are no restrictions for this procedure. Please do NOT wear cologne, perfume, aftershave, or lotions (deodorant is allowed). Please arrive 15 minutes prior to your appointment time.    Follow-Up: Follow up as scheduled   Other Instructions

## 2022-11-10 LAB — HEPATIC FUNCTION PANEL
ALT: 18 IU/L (ref 0–44)
AST: 21 IU/L (ref 0–40)
Albumin: 4.2 g/dL (ref 3.8–4.8)
Alkaline Phosphatase: 74 IU/L (ref 44–121)
Bilirubin Total: 0.4 mg/dL (ref 0.0–1.2)
Bilirubin, Direct: 0.15 mg/dL (ref 0.00–0.40)
Total Protein: 6.7 g/dL (ref 6.0–8.5)

## 2022-11-10 LAB — LIPID PANEL
Chol/HDL Ratio: 3.6 ratio (ref 0.0–5.0)
Cholesterol, Total: 127 mg/dL (ref 100–199)
HDL: 35 mg/dL — ABNORMAL LOW (ref >39)
LDL Chol Calc (NIH): 64 mg/dL (ref 0–99)
Triglycerides: 163 mg/dL — ABNORMAL HIGH (ref 0–149)
VLDL Cholesterol Cal: 28 mg/dL (ref 5–40)

## 2022-11-25 ENCOUNTER — Other Ambulatory Visit (HOSPITAL_COMMUNITY): Payer: Medicare Other

## 2022-12-01 MED FILL — Potassium Chloride Inj 2 mEq/ML: INTRAVENOUS | Qty: 40 | Status: AC

## 2022-12-01 MED FILL — Magnesium Sulfate Inj 50%: INTRAMUSCULAR | Qty: 10 | Status: AC

## 2022-12-01 MED FILL — Heparin Sodium (Porcine) Inj 1000 Unit/ML: Qty: 1000 | Status: AC

## 2022-12-02 DIAGNOSIS — Z955 Presence of coronary angioplasty implant and graft: Secondary | ICD-10-CM | POA: Diagnosis not present

## 2022-12-02 DIAGNOSIS — N400 Enlarged prostate without lower urinary tract symptoms: Secondary | ICD-10-CM | POA: Diagnosis not present

## 2022-12-02 DIAGNOSIS — I2510 Atherosclerotic heart disease of native coronary artery without angina pectoris: Secondary | ICD-10-CM | POA: Diagnosis not present

## 2022-12-02 DIAGNOSIS — I10 Essential (primary) hypertension: Secondary | ICD-10-CM | POA: Diagnosis not present

## 2022-12-02 DIAGNOSIS — I350 Nonrheumatic aortic (valve) stenosis: Secondary | ICD-10-CM | POA: Diagnosis not present

## 2022-12-02 DIAGNOSIS — E1165 Type 2 diabetes mellitus with hyperglycemia: Secondary | ICD-10-CM | POA: Diagnosis not present

## 2022-12-02 DIAGNOSIS — E7800 Pure hypercholesterolemia, unspecified: Secondary | ICD-10-CM | POA: Diagnosis not present

## 2022-12-13 ENCOUNTER — Encounter: Payer: Self-pay | Admitting: Vascular Surgery

## 2022-12-13 ENCOUNTER — Ambulatory Visit (INDEPENDENT_AMBULATORY_CARE_PROVIDER_SITE_OTHER): Payer: Medicare Other | Admitting: Vascular Surgery

## 2022-12-13 VITALS — BP 148/79 | HR 83 | Temp 98.0°F | Resp 16 | Ht 69.0 in | Wt 208.0 lb

## 2022-12-13 DIAGNOSIS — I6523 Occlusion and stenosis of bilateral carotid arteries: Secondary | ICD-10-CM

## 2022-12-13 NOTE — Progress Notes (Signed)
Patient name: [name] [name] MRN: [id number] DOB: Mar 06, 1946 Sex: male  REASON FOR CONSULT: 1 year follow-up for surveillance of carotid artery disease  HPI: [name] [name] is a 77 y.o. male with history of coronary artery disease status post CABG in 2009, diabetes, hypertension, history of NSTEMI that presents for surveillance of his carotid artery disease.   He was initially referred for right carotid stenosis after his PCP heard a bruit.  He ultimately had an ultrasound at [facility] radiology that was concerning for 70% right ICA stenosis.  A repeat ultrasound here suggested stenosis less than 50% and we have been following his carotid disease with surveillance ultrasound.   His last duplex here last year suggested 1-39% stenosis in the ICA bilaterally.    On 1 year follow-up today he has no new concerns.  He has had no history of strokes or TIAs since last follow-up.  No other focal neurologic symptoms.  He did have a TAVR on 10/04/22.  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 [name] & Dr. Cyndia Bent   Severe aortic stenosis     Past Surgical History:  Procedure Laterality Date   ABDOMINAL AORTOGRAM W/LOWER EXTREMITY N/A 12/30/2020   Procedure: ABDOMINAL AORTOGRAM W/LOWER EXTREMITY;  Surgeon: Wellington Hampshire, MD;  Location: [place] [facility];  Service: Cardiovascular;  Laterality: N/A;   ADENOIDECTOMY     CARDIAC CATHETERIZATION N/A 07/18/2016   Procedure: Left Heart Cath and Cors/Grafts Angiography;  Surgeon: Burnell Blanks, MD;  Location: [place] [facility];  Service: Cardiovascular;  Laterality: N/A;   CARDIAC CATHETERIZATION N/A 07/18/2016   Procedure: Coronary Stent Intervention;  Surgeon: Burnell Blanks, MD;  Location: [place] [facility];  Service: Cardiovascular;  Laterality: N/A;   CHOLECYSTECTOMY     CORONARY ANGIOPLASTY WITH STENT PLACEMENT     CORONARY ARTERY BYPASS GRAFT     CORONARY BALLOON ANGIOPLASTY N/A 04/01/2022   Procedure: CORONARY BALLOON ANGIOPLASTY;  Surgeon: Jettie Booze, MD;  Location: [place] [facility];  Service: Cardiovascular;  Laterality: N/A;  SVG-OM1   CORONARY BALLOON ANGIOPLASTY N/A 09/10/2021   Procedure: CORONARY BALLOON ANGIOPLASTY;  Surgeon: Sherren Mocha, MD;  Location: [place] [facility];  Service: Cardiovascular;  Laterality: N/A;   CORONARY STENT INTERVENTION N/A 01/11/2019   Procedure: CORONARY STENT INTERVENTION;  Surgeon: Sherren Mocha, MD;  Location: [place] [facility];  Service: Cardiovascular;  Laterality: N/A;   ENDARTERECTOMY FEMORAL Right 01/13/2021   Procedure: RIGHT FEMORAL ENDARTERECTOMY  WITH PATCH ANGIOPLASTY;  Surgeon: Rosetta Posner, MD;  Location: [place];  Service: Vascular;  Laterality: Right;   INGUINAL HERNIA REPAIR Right    INTRAOPERATIVE TRANSTHORACIC ECHOCARDIOGRAM N/A 10/04/2022   Procedure: INTRAOPERATIVE TRANSTHORACIC ECHOCARDIOGRAM;  Surgeon: Sherren Mocha, MD;  Location: [place];  Service: Open Heart Surgery;  Laterality: N/A;   LEFT HEART CATH AND CORS/GRAFTS ANGIOGRAPHY N/A 01/11/2019   Procedure: LEFT HEART CATH AND CORS/GRAFTS ANGIOGRAPHY;  Surgeon: Sherren Mocha, MD;  [facility];  Service: Cardiovascular;  Laterality: N/A;   LEFT HEART CATH AND CORS/GRAFTS ANGIOGRAPHY N/A 04/01/2022   Procedure: LEFT HEART CATH AND CORS/GRAFTS ANGIOGRAPHY;  Surgeon: Jettie Booze, MD;  Location: [place] [facility];  Service: Cardiovascular;  Laterality: N/A;   LEFT HEART CATH AND CORS/GRAFTS ANGIOGRAPHY N/A 09/10/2021   Procedure: LEFT  HEART CATH AND CORS/GRAFTS ANGIOGRAPHY;  Surgeon: Sherren Mocha, MD;  Location: [place] [facility];  Service: Cardiovascular;  Laterality: N/A;   PACEMAKER IMPLANT N/A 10/05/2022   Procedure: PACEMAKER IMPLANT;  Surgeon: Constance Haw, MD;  Location: [place] [facility];  Service: Cardiovascular;  Laterality: N/A;   Right femoral endarterectomy and Dacron patch angioplasty  01/13/3021   TONSILLECTOMY     TRANSCATHETER AORTIC VALVE REPLACEMENT, TRANSFEMORAL Left 10/04/2022   Procedure: Transcatheter Aortic Valve Replacement, Transfemoral;  Surgeon: Sherren Mocha, MD;  Location: [place];  Service: Open Heart Surgery;  Laterality: Left;    Family History  Problem Relation Age of Onset   Heart disease Mother        No details   Diabetes Neg Hx     SOCIAL HISTORY: Social History   Socioeconomic History   Marital status: Married    Spouse name: Not on file   Number of children: Not on file   Years of education: Not on file   Highest education level: Not on file  Occupational History   Not on file  Tobacco Use   Smoking status: Never   Smokeless tobacco: Never  Vaping Use   Vaping Use: Never used  Substance and Sexual Activity   Alcohol use: No   Drug use: No   Sexual activity: Not on file  Other Topics Concern   Not on file  Social History Narrative   Not on file   Social Determinants of Health   Financial Resource Strain: Not on file  Food Insecurity: No Food Insecurity (10/04/2022)   Hunger Vital Sign    Worried About Running Out of Food in the Last Year: Never true    Ran Out of Food in the Last Year: Never true  Transportation Needs: No Transportation Needs (10/04/2022)   PRAPARE - Hydrologist (Medical): No    Lack of Transportation (Non-Medical): No  Physical Activity: Not on file  Stress: Not on file  Social Connections: Not on file  Intimate Partner Violence: Not At Risk (10/04/2022)   Humiliation, Afraid, Rape, and Kick  questionnaire    Fear of Current or Ex-Partner: No    Emotionally Abused: No    Physically Abused: No    Sexually Abused: No    Allergies  Allergen Reactions   Empagliflozin Other (See Comments)    Abdominal pain/dizziness    Current Outpatient Medications  Medication Sig Dispense Refill   amLODipine (NORVASC) 10 MG tablet Take 10 mg by mouth daily.     amoxicillin (AMOXIL) 500 MG tablet Take 4 tablets by mouth 1 hour prior to dental procedures and cleanings. 12 tablet 6   aspirin 81 MG EC tablet TAKE 1 TABLET BY MOUTH EVERY DAY 90 tablet 3   benazepril (LOTENSIN) 40 MG tablet Take 40 mg by mouth daily.      clopidogrel (PLAVIX) 75 MG tablet TAKE 1 TABLET BY MOUTH EVERY DAY 90 tablet 3   finasteride (PROSCAR) 5 MG tablet Take 5 mg by mouth daily.     glucose blood (PRECISION QID TEST) test strip 1 each by Other route  as directed.     hydrochlorothiazide (HYDRODIURIL) 25 MG tablet Take 1 tablet (25 mg total) by mouth daily. 90 tablet 3   Insulin Pen Needle (B-D UF III MINI PEN NEEDLES) 31G X 5 MM MISC Use to inject Ozempic once a week     isosorbide mononitrate (IMDUR) 120 MG 24 hr tablet Take 60 mg by mouth in the morning and at bedtime.     metFORMIN (GLUCOPHAGE) 1000 MG tablet Take 1,000 mg by mouth 2 (two) times daily with a meal.     Metoprolol Tartrate 75 MG TABS TAKE 1 TABLET BY MOUTH 2 (TWO) TIMES DAILY. 180 tablet 1   Multiple Vitamin (MULTIVITAMIN WITH MINERALS) TABS tablet Take 1 tablet by mouth daily.     nitroGLYCERIN (NITROSTAT) 0.4 MG SL tablet DISSOLVE 1 TABLET UNDER THE TONGUE EVERY 5 MINUTES AS NEEDED FOR CHEST PAIN. MAX OF 3 DOSES, THEN 911. 25 tablet 11   NOVOLIN N FLEXPEN 100 UNIT/ML FlexPen Inject 15 Units into the skin 2 (two) times daily.     ranolazine (RANEXA) 500 MG 12 hr tablet Take 500 mg by mouth 2 (two) times daily.     rosuvastatin (CRESTOR) 40 MG tablet Take 1 tablet (40 mg total) by mouth daily. (Patient taking differently: Take 20 mg by mouth daily.)  90 tablet 3   No current facility-administered medications for this visit.    REVIEW OF SYSTEMS:  '[X]'$  denotes positive finding, '[ ]'$  denotes negative finding Cardiac  Comments:  Chest pain or chest pressure:    Shortness of breath upon exertion:    Short of breath when lying flat:    Irregular heart rhythm:        Vascular    Pain in calf, thigh, or hip brought on by ambulation:    Pain in feet at night that wakes you up from your sleep:     Blood clot in your veins:    Leg swelling:         Pulmonary    Oxygen at home:    Productive cough:     Wheezing:         Neurologic    Sudden weakness in arms or legs:     Sudden numbness in arms or legs:     Sudden onset of difficulty speaking or slurred speech:    Temporary loss of vision in one eye:     Problems with dizziness:         Gastrointestinal    Blood in stool:     Vomited blood:         Genitourinary    Burning when urinating:     Blood in urine:        Psychiatric    Major depression:         Hematologic    Bleeding problems:    Problems with blood clotting too easily:        Skin    Rashes or ulcers:        Constitutional    Fever or chills:      PHYSICAL EXAM: Vitals:   12/13/22 1131 12/13/22 1134  BP: (!) 148/78 (!) 148/79  Pulse: 83 83  Resp: 16   Temp: 98 F (36.7 C)   TempSrc: Temporal   SpO2: 97%   Weight: 208 lb (94.3 kg)   Height: '5\' 9"'$  (1.753 m)     GENERAL: The patient is a well-nourished male, in no acute distress. The vital signs are documented  above. CARDIAC: There is a regular rate and rhythm.  VASCULAR:  2+ bilateral femoral pulse palpable groins 2+ palpable PT pulses bilateral lower extremities  PULMONARY: No respiratory distress. ABDOMEN: Soft and non-tender. MUSCULOSKELETAL: There are no major deformities or cyanosis. NEUROLOGIC: No focal weakness or paresthesias are detected.  CN II-XII grossly intact.  DATA:   Carotid duplex 08/29/22 showed 40-59% right ICA  stenosis and 1-39% ICA stenosis  Assessment/Plan:  77 year old male that presents for 1 year follow-up for surveillance of his carotid artery disease.  His carotid artery disease remains asymptomatic.  There was previous concern for a moderate right ICA stenosis and we have never been able to demonstrate that on duplex in our office.  His last carotid duplex in November 2023 showed 40-59% right ICA stenosis and a 1 to 39% left ICA stenosis.  Discussed current guidelines would be surgery for greater than 80% stenosis for asymptomatic carotid disease.  We will continue medical management for now with ongoing surveillance and will repeat a duplex in 1 year in our office.  He knows to call with questions or concerns.    Marty Heck, MD [facility] Office: [place]

## 2023-01-03 ENCOUNTER — Ambulatory Visit: Payer: Medicare Other | Attending: Cardiovascular Disease | Admitting: Cardiovascular Disease

## 2023-01-03 ENCOUNTER — Encounter: Payer: Self-pay | Admitting: Cardiovascular Disease

## 2023-01-03 VITALS — BP 128/66 | HR 67 | Ht 69.0 in | Wt 204.2 lb

## 2023-01-03 DIAGNOSIS — I350 Nonrheumatic aortic (valve) stenosis: Secondary | ICD-10-CM

## 2023-01-03 DIAGNOSIS — I739 Peripheral vascular disease, unspecified: Secondary | ICD-10-CM | POA: Diagnosis not present

## 2023-01-03 DIAGNOSIS — I25119 Atherosclerotic heart disease of native coronary artery with unspecified angina pectoris: Secondary | ICD-10-CM | POA: Insufficient documentation

## 2023-01-03 DIAGNOSIS — I10 Essential (primary) hypertension: Secondary | ICD-10-CM | POA: Diagnosis not present

## 2023-01-03 DIAGNOSIS — E785 Hyperlipidemia, unspecified: Secondary | ICD-10-CM | POA: Diagnosis not present

## 2023-01-03 NOTE — Patient Instructions (Signed)
Medication Instructions:  No changes *If you need a refill on your cardiac medications before your next appointment, please call your pharmacy*   Lab Work: None ordered If you have labs (blood work) drawn today and your tests are completely normal, you will receive your results only by: MyChart Message (if you have MyChart) OR A paper copy in the mail If you have any lab test that is abnormal or we need to change your treatment, we will call you to review the results.   Testing/Procedures: None ordered   Follow-Up: At [facility], you and your health needs are our priority.  As part of our continuing mission to provide you with exceptional heart care, we have created designated Provider Care Teams.  These Care Teams include your primary Cardiologist (physician) and Advanced Practice Providers (APPs -  Physician Assistants and Nurse Practitioners) who all work together to provide you with the care you need, when you need it.  We recommend signing up for the patient portal called "MyChart".  Sign up information is provided on this After Visit Summary.  MyChart is used to connect with patients for Virtual Visits (Telemedicine).  Patients are able to view lab/test results, encounter notes, upcoming appointments, etc.  Non-urgent messages can be sent to your provider as well.   To learn more about what you can do with MyChart, go to [email or web address].    Your next appointment:   12 month(s)  Provider:   Dr. Arida  

## 2023-01-03 NOTE — Progress Notes (Signed)
Cardiology Office Note   Date:  01/03/2023   ID:  [name] [name], [name] [name] Jul 19, 1946, MRN [id number]  PCP:  Marda Stalker, PA-C  Cardiologist: Dr. [name]  No chief complaint on file.      History of Present Illness: [name] [name] is a 77 y.o. male who is here today for follow-up visit regarding peripheral arterial disease.   He has known history of coronary artery disease status post CABG and subsequent PCI, aortic stenosis status post TAVR, permanent pacemaker placement, essential hypertension, hyperlipidemia, diabetes mellitus, carotid disease and peripheral arterial disease.    He was seen for severe claudication in 2022.  Angiography was performed in March 2022 which showed no significant aortoiliac disease.  There was severe heavily calcified disease affecting the right common femoral artery into the ostium of the profunda with severe TP trunk stenosis and proximal anterior tibial artery disease with collaterals.  He was treated with right common femoral artery endarterectomy.  He has been doing very well since then with resolution of claudication. He underwent TAVR last year and pacemaker placement.  He reports stable symptoms of exertional chest pain. 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 [name] & Dr. Cyndia Bent   Severe aortic stenosis     Past Surgical History:  Procedure Laterality Date   ABDOMINAL AORTOGRAM W/LOWER EXTREMITY N/A 12/30/2020    Procedure: ABDOMINAL AORTOGRAM W/LOWER EXTREMITY;  Surgeon: Wellington Hampshire, MD;  Location: [place] [facility];  Service: Cardiovascular;  Laterality: N/A;   ADENOIDECTOMY     CARDIAC CATHETERIZATION N/A 07/18/2016   Procedure: Left Heart Cath and Cors/Grafts Angiography;  Surgeon: Burnell Blanks, MD;  Location: [place] [facility];  Service: Cardiovascular;  Laterality: N/A;   CARDIAC CATHETERIZATION N/A 07/18/2016   Procedure: Coronary Stent Intervention;  Surgeon: Burnell Blanks, MD;  Location: [place] [facility];  Service: Cardiovascular;  Laterality: N/A;   CHOLECYSTECTOMY     CORONARY ANGIOPLASTY WITH STENT PLACEMENT     CORONARY ARTERY BYPASS GRAFT     CORONARY BALLOON ANGIOPLASTY N/A 04/01/2022   Procedure: CORONARY BALLOON ANGIOPLASTY;  Surgeon: Jettie Booze, MD;  Location: [place] [facility];  Service: Cardiovascular;  Laterality: N/A;  SVG-OM1   CORONARY BALLOON ANGIOPLASTY N/A 09/10/2021   Procedure: CORONARY BALLOON ANGIOPLASTY;  Surgeon: Sherren Mocha, MD;  Location: [place] [facility];  Service: Cardiovascular;  Laterality: N/A;   CORONARY STENT INTERVENTION N/A 01/11/2019   Procedure: CORONARY STENT INTERVENTION;  Surgeon: Sherren Mocha, MD;  Location: [place] [facility];  Service: Cardiovascular;  Laterality: N/A;   ENDARTERECTOMY FEMORAL Right 01/13/2021   Procedure: RIGHT FEMORAL ENDARTERECTOMY  WITH PATCH ANGIOPLASTY;  Surgeon: Rosetta Posner, MD;  Location: [facility];  Service: Vascular;  Laterality: Right;   INGUINAL HERNIA REPAIR Right    INTRAOPERATIVE TRANSTHORACIC ECHOCARDIOGRAM N/A 10/04/2022   Procedure: INTRAOPERATIVE TRANSTHORACIC ECHOCARDIOGRAM;  Surgeon: Sherren Mocha, MD;  Location: [place];  Service: Open Heart Surgery;  Laterality: N/A;   LEFT HEART CATH AND CORS/GRAFTS ANGIOGRAPHY N/A 01/11/2019   Procedure: LEFT HEART CATH AND CORS/GRAFTS ANGIOGRAPHY;  Surgeon: Sherren Mocha, MD;  Location: [place] [facility];  Service: Cardiovascular;   Laterality: N/A;   LEFT HEART CATH AND CORS/GRAFTS ANGIOGRAPHY N/A 04/01/2022   Procedure: LEFT HEART CATH AND CORS/GRAFTS ANGIOGRAPHY;  Surgeon: Jettie Booze, MD;  Location: [place] [facility];  Service: Cardiovascular;  Laterality: N/A;   LEFT HEART CATH AND CORS/GRAFTS ANGIOGRAPHY N/A 09/10/2021   Procedure: LEFT HEART CATH AND CORS/GRAFTS ANGIOGRAPHY;  Surgeon: Sherren Mocha, MD;  Location: [place] [facility];  Service: Cardiovascular;  Laterality: N/A;   PACEMAKER IMPLANT N/A 10/05/2022   Procedure: PACEMAKER IMPLANT;  Surgeon: Constance Haw, MD;  Location: [place] [facility];  Service: Cardiovascular;  Laterality: N/A;   Right femoral endarterectomy and Dacron patch angioplasty  01/13/3021   TONSILLECTOMY     TRANSCATHETER AORTIC VALVE REPLACEMENT, TRANSFEMORAL Left 10/04/2022   Procedure: Transcatheter Aortic Valve Replacement, Transfemoral;  Surgeon: Sherren Mocha, MD;  Location: [place];  Service: Open Heart Surgery;  Laterality: Left;     Current Outpatient Medications  Medication Sig Dispense Refill   amLODipine (NORVASC) 10 MG tablet Take 10 mg by mouth daily.     amoxicillin (AMOXIL) 500 MG tablet Take 4 tablets by mouth 1 hour prior to dental procedures and cleanings. 12 tablet 6   aspirin 81 MG EC tablet TAKE 1 TABLET BY MOUTH EVERY DAY 90 tablet 3   benazepril (LOTENSIN) 40 MG tablet Take 40 mg by mouth daily.      clopidogrel (PLAVIX) 75 MG tablet TAKE 1 TABLET BY MOUTH EVERY DAY 90 tablet 3   finasteride (PROSCAR) 5 MG tablet Take 5 mg by mouth daily.     glucose blood (PRECISION QID TEST) test strip 1 each by Other route as directed.     hydrochlorothiazide (HYDRODIURIL) 25 MG tablet Take 1 tablet (25 mg total) by mouth daily. 90 tablet 3   Insulin Pen Needle (B-D UF III MINI PEN NEEDLES) 31G X 5 MM MISC Use to inject Ozempic once a week     isosorbide mononitrate (IMDUR) 120 MG 24 hr tablet Take 60 mg by mouth in the morning and at bedtime.     metFORMIN  (GLUCOPHAGE) 1000 MG tablet Take 1,000 mg by mouth 2 (two) times daily with a meal.     Metoprolol Tartrate 75 MG TABS TAKE 1 TABLET BY MOUTH 2 (TWO) TIMES DAILY. 180 tablet 1   Multiple Vitamin (MULTIVITAMIN WITH MINERALS) TABS tablet Take 1 tablet by mouth daily.     nitroGLYCERIN (NITROSTAT) 0.4 MG SL tablet DISSOLVE 1 TABLET UNDER THE TONGUE EVERY 5 MINUTES AS NEEDED FOR CHEST PAIN. MAX OF 3 DOSES, THEN 911. 25 tablet 11   NOVOLIN N FLEXPEN 100 UNIT/ML FlexPen Inject 15 Units into the skin 2 (two) times daily.     ranolazine (RANEXA) 500 MG 12 hr tablet Take 500 mg by mouth 2 (two) times daily.     rosuvastatin (CRESTOR) 40 MG tablet Take 1 tablet (40 mg total) by mouth daily. (Patient taking differently: Take 20 mg by mouth daily.) 90 tablet 3   No current facility-administered medications for this visit.    Allergies:   Empagliflozin    Social History:  The patient  reports that he has never smoked. He has never used smokeless tobacco. He  reports that he does not drink alcohol and does not use drugs.   Family History:  The patient's family history includes Heart disease in his mother.    ROS:  Please see the history of present illness.   Otherwise, review of systems are positive for none.   All other systems are reviewed and negative.    PHYSICAL EXAM: VS:  BP 128/66   Pulse 67   Ht 5\' 9"  (1.753 m)   Wt 204 lb 3.2 oz (92.6 kg)   SpO2 97%   BMI 30.16 kg/m  , BMI Body mass index is 30.16 kg/m. GEN: Well nourished, well developed, in no acute distress  HEENT: normal  Neck: no JVD, carotid bruits, or masses Cardiac: RRR; no  rubs, or gallops,no edema .  1/6 systolic murmur in the aortic area which is mid peaking Respiratory:  clear to auscultation bilaterally, normal work of breathing GI: soft, nontender, nondistended, + BS MS: no deformity or atrophy  Skin: warm and dry, no rash Neuro:  Strength and sensation are intact Psych: euthymic mood, full affect    EKG:  EKG is  not ordered today.  Recent Labs: 10/05/2022: Magnesium 1.8 10/06/2022: BUN 12; Creatinine, Ser 1.19; Hemoglobin 12.7; Platelets 170; Potassium 4.0; Sodium 134 11/09/2022: ALT 18    Lipid Panel    Component Value Date/Time   CHOL 127 11/09/2022 0000   TRIG 163 (H) 11/09/2022 0000   HDL 35 (L) 11/09/2022 0000   CHOLHDL 3.6 11/09/2022 0000   CHOLHDL 2.5 01/14/2021 0105   VLDL 26 01/14/2021 0105   LDLCALC 64 11/09/2022 0000      Wt Readings from Last 3 Encounters:  01/03/23 204 lb 3.2 oz (92.6 kg)  12/13/22 208 lb (94.3 kg)  11/09/22 211 lb (95.7 kg)           No data to display            ASSESSMENT AND PLAN:  1.  Peripheral arterial disease: Status post right common femoral artery endarterectomy for severe claudication.  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  Continue medical therapy.  2.  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  Ranexa is costing him $100 a month.  He is not sure if it is helping his symptoms or not.  I asked him to try to go without the medication for 1 week and see how he feels.  3.  Hyperlipidemia: I reviewed most recent lipid profile which showed an LDL of 64.  Continue high-dose rosuvastatin.  4.  Essential hypertension: Blood pressure is controlled on current medications.  5.  Status post TAVR for severe aortic stenosis: Most recent echo showed normal functioning TAVR prosthesis.    Disposition:   FU with me in 12 months  Signed,  [name], MD  01/03/2023 10:54 AM    [place]

## 2023-01-05 ENCOUNTER — Ambulatory Visit (INDEPENDENT_AMBULATORY_CARE_PROVIDER_SITE_OTHER): Payer: Medicare Other

## 2023-01-05 DIAGNOSIS — I442 Atrioventricular block, complete: Secondary | ICD-10-CM | POA: Diagnosis not present

## 2023-01-06 LAB — CUP PACEART REMOTE DEVICE CHECK
Battery Remaining Longevity: 156 mo
Battery Voltage: 3.2 V
Brady Statistic AP VP Percent: 20.44 %
Brady Statistic AP VS Percent: 0 %
Brady Statistic AS VP Percent: 79 %
Brady Statistic AS VS Percent: 0.57 %
Brady Statistic RA Percent Paced: 20.67 %
Brady Statistic RV Percent Paced: 99.43 %
Date Time Interrogation Session: 20240321195154
Implantable Lead Connection Status: 753985
Implantable Lead Connection Status: 753985
Implantable Lead Implant Date: 20231220
Implantable Lead Implant Date: 20231220
Implantable Lead Location: 753859
Implantable Lead Location: 753860
Implantable Lead Model: 3830
Implantable Lead Model: 5076
Implantable Pulse Generator Implant Date: 20231220
Lead Channel Impedance Value: 304 Ohm
Lead Channel Impedance Value: 380 Ohm
Lead Channel Impedance Value: 399 Ohm
Lead Channel Impedance Value: 665 Ohm
Lead Channel Pacing Threshold Amplitude: 0.5 V
Lead Channel Pacing Threshold Amplitude: 0.75 V
Lead Channel Pacing Threshold Pulse Width: 0.4 ms
Lead Channel Pacing Threshold Pulse Width: 0.4 ms
Lead Channel Sensing Intrinsic Amplitude: 3.625 mV
Lead Channel Sensing Intrinsic Amplitude: 3.625 mV
Lead Channel Sensing Intrinsic Amplitude: 6.25 mV
Lead Channel Setting Pacing Amplitude: 1.5 V
Lead Channel Setting Pacing Amplitude: 2 V
Lead Channel Setting Pacing Pulse Width: 0.4 ms
Lead Channel Setting Sensing Sensitivity: 1.2 mV
Zone Setting Status: 755011
Zone Setting Status: 755011

## 2023-01-09 ENCOUNTER — Ambulatory Visit: Payer: Medicare Other | Attending: Cardiology | Admitting: Cardiology

## 2023-01-09 ENCOUNTER — Encounter: Payer: Self-pay | Admitting: Cardiology

## 2023-01-09 VITALS — BP 120/68 | HR 65 | Ht 69.0 in | Wt 208.0 lb

## 2023-01-09 DIAGNOSIS — I442 Atrioventricular block, complete: Secondary | ICD-10-CM | POA: Insufficient documentation

## 2023-01-09 DIAGNOSIS — I2510 Atherosclerotic heart disease of native coronary artery without angina pectoris: Secondary | ICD-10-CM | POA: Insufficient documentation

## 2023-01-09 DIAGNOSIS — D6869 Other thrombophilia: Secondary | ICD-10-CM | POA: Insufficient documentation

## 2023-01-09 LAB — CUP PACEART INCLINIC DEVICE CHECK
Battery Remaining Longevity: 156 mo
Brady Statistic RA Percent Paced: 21.8 %
Brady Statistic RV Percent Paced: 99.4 %
Date Time Interrogation Session: 20240325171245
Implantable Lead Connection Status: 753985
Implantable Lead Connection Status: 753985
Implantable Lead Implant Date: 20231220
Implantable Lead Implant Date: 20231220
Implantable Lead Location: 753859
Implantable Lead Location: 753860
Implantable Lead Model: 3830
Implantable Lead Model: 5076
Implantable Pulse Generator Implant Date: 20231220
Lead Channel Impedance Value: 418 Ohm
Lead Channel Impedance Value: 741 Ohm
Lead Channel Pacing Threshold Amplitude: 0.5 V
Lead Channel Pacing Threshold Amplitude: 0.75 V
Lead Channel Pacing Threshold Pulse Width: 0.4 ms
Lead Channel Pacing Threshold Pulse Width: 0.4 ms
Lead Channel Sensing Intrinsic Amplitude: 4.6 mV
Lead Channel Sensing Intrinsic Amplitude: 6.9 mV

## 2023-01-09 NOTE — Progress Notes (Signed)
Electrophysiology Office Note   Date:  01/09/2023   ID:  [name] [name], [name] [name] [birth year], MRN [id number]  PCP:  Marda Stalker, PA-C  Cardiologist:  [name] Primary Electrophysiologist:  Gwenevere Goga Meredith Leeds, MD    Chief Complaint: pacemaker   History of Present Illness: [name] [name] is a 77 y.o. male who is being seen today for the evaluation of pacemaker at the request of Marda Stalker, [place]. Presenting today for electrophysiology evaluation.  He has his history of CAD (prior CABG/PCIs post CABG, most recently PCI to SVG to OM June 2023), PVD (R CFA endarterectomy), HTN, HLD, DM, VHD w/severe AS.  He is status post TAVR.  Post TAVR, he developed complete heart block.  He is now status post Medtronic dual-chamber pacemaker implanted 10/05/2022.  Today, he denies symptoms of palpitations, chest pain, shortness of breath, orthopnea, PND, lower extremity edema, claudication, dizziness, presyncope, syncope, bleeding, or neurologic sequela. The patient is tolerating medications without difficulties.    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 [name] & Dr. Cyndia Bent   Severe aortic stenosis    Past Surgical History:  Procedure Laterality Date   ABDOMINAL AORTOGRAM W/LOWER EXTREMITY N/A 12/30/2020   Procedure: ABDOMINAL AORTOGRAM W/LOWER EXTREMITY;  Surgeon: Wellington Hampshire, MD;  Location: [place] [facility];  Service: Cardiovascular;  Laterality: N/A;   ADENOIDECTOMY     CARDIAC CATHETERIZATION N/A 07/18/2016   Procedure: Left Heart Cath and  Cors/Grafts Angiography;  Surgeon: Burnell Blanks, MD;  Location: [place] [facility];  Service: Cardiovascular;  Laterality: N/A;   CARDIAC CATHETERIZATION N/A 07/18/2016   Procedure: Coronary Stent Intervention;  Surgeon: Burnell Blanks, MD;  Location: [facility] [facility];  Service: Cardiovascular;  Laterality: N/A;   CHOLECYSTECTOMY     CORONARY ANGIOPLASTY WITH STENT PLACEMENT     CORONARY ARTERY BYPASS GRAFT     CORONARY BALLOON ANGIOPLASTY N/A 04/01/2022   Procedure: CORONARY BALLOON ANGIOPLASTY;  Surgeon: Jettie Booze, MD;  Location: [place] [facility];  Service: Cardiovascular;  Laterality: N/A;  SVG-OM1   CORONARY BALLOON ANGIOPLASTY N/A 09/10/2021   Procedure: CORONARY BALLOON ANGIOPLASTY;  Surgeon: Sherren Mocha, MD;  Location: [place] [facility];  Service: Cardiovascular;  Laterality: N/A;   CORONARY STENT INTERVENTION N/A 01/11/2019   Procedure: CORONARY STENT INTERVENTION;  Surgeon: Sherren Mocha, MD;  Location: [place] [facility];  Service: Cardiovascular;  Laterality: N/A;   ENDARTERECTOMY FEMORAL Right 01/13/2021   Procedure: RIGHT FEMORAL ENDARTERECTOMY  WITH PATCH ANGIOPLASTY;  Surgeon: Rosetta Posner, MD;  Location: [place];  Service: Vascular;  Laterality: Right;   INGUINAL HERNIA REPAIR Right    INTRAOPERATIVE TRANSTHORACIC ECHOCARDIOGRAM N/A 10/04/2022   Procedure: INTRAOPERATIVE TRANSTHORACIC ECHOCARDIOGRAM;  Surgeon: Sherren Mocha, MD;  Location: [place];  Service: Open Heart Surgery;  Laterality: N/A;   LEFT HEART CATH AND CORS/GRAFTS ANGIOGRAPHY N/A 01/11/2019   Procedure: LEFT HEART CATH AND CORS/GRAFTS ANGIOGRAPHY;  Surgeon: Sherren Mocha, MD;  Location: [place] [facility];  Service: Cardiovascular;  Laterality: N/A;   LEFT HEART CATH AND CORS/GRAFTS ANGIOGRAPHY N/A 04/01/2022   Procedure: LEFT HEART CATH AND CORS/GRAFTS ANGIOGRAPHY;  Surgeon: Jettie Booze, MD;  Location: [place] [facility];  Service: Cardiovascular;  Laterality: N/A;    LEFT HEART CATH AND CORS/GRAFTS ANGIOGRAPHY N/A 09/10/2021   Procedure: LEFT HEART CATH AND CORS/GRAFTS ANGIOGRAPHY;  Surgeon: Sherren Mocha, MD;  Location: [place] [facility];  Service: Cardiovascular;  Laterality: N/A;   PACEMAKER IMPLANT N/A 10/05/2022   Procedure: PACEMAKER IMPLANT;  Surgeon: Constance Haw, MD;  Location: [place] [facility];  Service: Cardiovascular;  Laterality: N/A;   Right femoral endarterectomy and Dacron patch angioplasty  01/13/3021   TONSILLECTOMY     TRANSCATHETER AORTIC VALVE REPLACEMENT, TRANSFEMORAL Left 10/04/2022   Procedure: Transcatheter Aortic Valve Replacement, Transfemoral;  Surgeon: Sherren Mocha, MD;  Location: [place];  Service: Open Heart Surgery;  Laterality: Left;     Current Outpatient Medications  Medication Sig Dispense Refill   amLODipine (NORVASC) 10 MG tablet Take 10 mg by mouth daily.     amoxicillin (AMOXIL) 500 MG tablet Take 4 tablets by mouth 1 hour prior to dental procedures and cleanings. 12 tablet 6   aspirin 81 MG EC tablet TAKE 1 TABLET BY MOUTH EVERY DAY 90 tablet 3   benazepril (LOTENSIN) 40 MG tablet Take 40 mg by mouth daily.      clopidogrel (PLAVIX) 75 MG tablet TAKE 1 TABLET BY MOUTH EVERY DAY 90 tablet 3   finasteride (PROSCAR) 5 MG tablet Take 5 mg by mouth daily.     glucose blood (PRECISION QID TEST) test strip 1 each by Other route as directed.     hydrochlorothiazide (HYDRODIURIL) 25 MG tablet Take 1 tablet (25 mg total) by mouth daily. 90 tablet 3   Insulin Pen Needle (B-D UF III MINI PEN NEEDLES) 31G X 5 MM MISC Use to inject Ozempic once a week     isosorbide mononitrate (IMDUR) 120 MG 24 hr tablet Take 60 mg by mouth in the morning and at bedtime.     metFORMIN (GLUCOPHAGE) 1000 MG tablet Take 1,000 mg by mouth 2 (two) times daily with a meal.     Metoprolol Tartrate 75 MG TABS TAKE 1 TABLET BY MOUTH 2 (TWO) TIMES DAILY. 180 tablet 1   Multiple Vitamin (MULTIVITAMIN WITH MINERALS) TABS tablet Take 1 tablet  by mouth daily.     nitroGLYCERIN (NITROSTAT) 0.4 MG SL tablet DISSOLVE 1 TABLET UNDER THE TONGUE EVERY 5 MINUTES AS NEEDED FOR CHEST PAIN. MAX OF 3 DOSES, THEN 911. 25 tablet 11   NOVOLIN N FLEXPEN 100 UNIT/ML FlexPen Inject 15 Units into the skin 2 (two) times daily.     ranolazine (RANEXA) 500 MG 12 hr tablet Take 500 mg by mouth 2 (two) times daily.     rosuvastatin (CRESTOR) 40 MG tablet Take 1 tablet (40 mg total) by mouth daily. (Patient taking differently: Take 20 mg by mouth daily.) 90 tablet 3   No current facility-administered medications for this visit.    Allergies:   Empagliflozin   Social History:  The patient  reports that he has never smoked. He has never used smokeless tobacco. He reports that he does not drink alcohol and does not use drugs.   Family History:  The patient's family history includes Heart disease in his mother.    ROS:  Please see the history of present illness.   Otherwise, review of systems is positive for  none.   All other systems are reviewed and negative.    PHYSICAL EXAM: VS:  BP 120/68   Pulse 65   Ht 5\' 9"  (1.753 m)   Wt 208 lb (94.3 kg)   SpO2 99%   BMI 30.72 kg/m  , BMI Body mass index is 30.72 kg/m. GEN: Well nourished, well developed, in no acute distress  HEENT: normal  Neck: no JVD, carotid bruits, or masses Cardiac: RRR; no murmurs, rubs, or gallops,no edema  Respiratory:  clear to auscultation bilaterally, normal work of breathing GI: soft, nontender, nondistended, + BS MS: no deformity or atrophy  Skin: warm and dry, device pocket is well healed Neuro:  Strength and sensation are intact Psych: euthymic mood, full affect  EKG:  EKG is ordered today. Personal review of the ekg ordered shows A sense, V pace  Device interrogation is reviewed today in detail.  See PaceArt for details.   Recent Labs: 10/05/2022: Magnesium 1.8 10/06/2022: BUN 12; Creatinine, Ser 1.19; Hemoglobin 12.7; Platelets 170; Potassium 4.0; Sodium  134 11/09/2022: ALT 18    Lipid Panel     Component Value Date/Time   CHOL 127 11/09/2022 0000   TRIG 163 (H) 11/09/2022 0000   HDL 35 (L) 11/09/2022 0000   CHOLHDL 3.6 11/09/2022 0000   CHOLHDL 2.5 01/14/2021 0105   VLDL 26 01/14/2021 0105   LDLCALC 64 11/09/2022 0000     Wt Readings from Last 3 Encounters:  01/09/23 208 lb (94.3 kg)  01/03/23 204 lb 3.2 oz (92.6 kg)  12/13/22 208 lb (94.3 kg)      Other studies Reviewed: Additional studies/ records that were reviewed today include: TTE 11/09/22  Review of the above records today demonstrates:   1. Left ventricular ejection fraction, by estimation, is 60 to 65%. The  left ventricle has normal function. The left ventricle has no regional  wall motion abnormalities. There is moderate concentric left ventricular  hypertrophy. Left ventricular  diastolic parameters are indeterminate.   2. Right ventricular systolic function is normal. The right ventricular  size is normal.   3. The mitral valve is normal in structure. Trivial mitral valve  regurgitation. No evidence of mitral stenosis.   4. The aortic valve has been repaired/replaced. Aortic valve  regurgitation is not visualized. No aortic stenosis is present. There is a  26 mm Edwards valve present in the aortic position. Procedure Date:  10/04/2022. Aortic valve mean gradient measures  6.0 mmHg. Aortic valve Vmax measures 1.69 m/s.   5. The inferior vena cava is normal in size with greater than 50%  respiratory variability, suggesting right atrial pressure of 3 mmHg.    ASSESSMENT AND PLAN:  1.  Severe aortic stenosis: Status post TAVR.  Plan per primary team.  2.  Complete heart block: Status post Medtronic dual-chamber pacemaker implanted 10/05/2022.  Device functioning appropriately.  Impedance, threshold, sensing within normal limits.  Zamariya Neal continue with current management.  3.  Coronary artery disease: Status post CABG with multiple PCI's.  No current chest pain.   Plan per primary cardiology.  4.  Peripheral arterial disease: Status post right femoral endarterectomy in 2022.  Followed by vascular surgery.  5.  Hypertension: well controlled  6.  Hyperlipidemia: Continue Crestor   Current medicines are reviewed at length with the patient today.   The patient does not have concerns regarding his medicines.  The following changes were made today:  none  Labs/ tests ordered today include:  Orders Placed This Encounter  Procedures   EKG 12-Lead     Disposition:   FU with Shakemia Madera 1 year  Signed, Sanjeev Main Meredith Leeds, MD  01/09/2023 3:16 PM     [address] [place] [address] [phone] (office) [phone] (fax)

## 2023-01-09 NOTE — Patient Instructions (Signed)
Medication Instructions:  Your physician recommends that you continue on your current medications as directed. Please refer to the Current Medication list given to you today. *If you need a refill on your cardiac medications before your next appointment, please call your pharmacy*   Follow-Up: At [facility], you and your health needs are our priority.  As part of our continuing mission to provide you with exceptional heart care, we have created designated Provider Care Teams.  These Care Teams include your primary Cardiologist (physician) and Advanced Practice Providers (APPs -  Physician Assistants and Nurse Practitioners) who all work together to provide you with the care you need, when you need it.   Your next appointment:   1 year(s)  Provider:   Allegra Lai, MD

## 2023-01-17 DIAGNOSIS — Z7984 Long term (current) use of oral hypoglycemic drugs: Secondary | ICD-10-CM | POA: Diagnosis not present

## 2023-01-17 DIAGNOSIS — E1165 Type 2 diabetes mellitus with hyperglycemia: Secondary | ICD-10-CM | POA: Diagnosis not present

## 2023-01-17 DIAGNOSIS — Z794 Long term (current) use of insulin: Secondary | ICD-10-CM | POA: Diagnosis not present

## 2023-02-02 ENCOUNTER — Other Ambulatory Visit: Payer: Self-pay | Admitting: General Practice

## 2023-02-02 ENCOUNTER — Other Ambulatory Visit: Payer: Self-pay | Admitting: Student

## 2023-02-02 ENCOUNTER — Other Ambulatory Visit: Payer: Self-pay | Admitting: Cardiovascular Disease

## 2023-02-08 NOTE — Progress Notes (Signed)
Remote pacemaker transmission.   

## 2023-02-22 ENCOUNTER — Other Ambulatory Visit: Payer: Self-pay | Admitting: Cardiovascular Disease

## 2023-02-22 DIAGNOSIS — I25119 Atherosclerotic heart disease of native coronary artery with unspecified angina pectoris: Secondary | ICD-10-CM

## 2023-02-22 DIAGNOSIS — I10 Essential (primary) hypertension: Secondary | ICD-10-CM

## 2023-03-23 ENCOUNTER — Other Ambulatory Visit: Payer: Self-pay | Admitting: General Practice

## 2023-03-23 ENCOUNTER — Other Ambulatory Visit: Payer: Self-pay | Admitting: Cardiovascular Disease

## 2023-03-23 DIAGNOSIS — I350 Nonrheumatic aortic (valve) stenosis: Secondary | ICD-10-CM

## 2023-03-28 DIAGNOSIS — Z6829 Body mass index (BMI) 29.0-29.9, adult: Secondary | ICD-10-CM | POA: Diagnosis not present

## 2023-03-28 DIAGNOSIS — R042 Hemoptysis: Secondary | ICD-10-CM | POA: Diagnosis not present

## 2023-04-06 ENCOUNTER — Ambulatory Visit (INDEPENDENT_AMBULATORY_CARE_PROVIDER_SITE_OTHER): Payer: Medicare Other

## 2023-04-06 DIAGNOSIS — I442 Atrioventricular block, complete: Secondary | ICD-10-CM | POA: Diagnosis not present

## 2023-04-06 LAB — CUP PACEART REMOTE DEVICE CHECK
Battery Remaining Longevity: 152 mo
Battery Voltage: 3.16 V
Brady Statistic AP VP Percent: 28.9 %
Brady Statistic AP VS Percent: 0 %
Brady Statistic AS VP Percent: 70.5 %
Brady Statistic AS VS Percent: 0.6 %
Brady Statistic RA Percent Paced: 29.18 %
Brady Statistic RV Percent Paced: 99.4 %
Date Time Interrogation Session: 20240620091129
Implantable Lead Connection Status: 753985
Implantable Lead Connection Status: 753985
Implantable Lead Implant Date: 20231220
Implantable Lead Implant Date: 20231220
Implantable Lead Location: 753859
Implantable Lead Location: 753860
Implantable Lead Model: 3830
Implantable Lead Model: 5076
Implantable Pulse Generator Implant Date: 20231220
Lead Channel Impedance Value: 323 Ohm
Lead Channel Impedance Value: 380 Ohm
Lead Channel Impedance Value: 418 Ohm
Lead Channel Impedance Value: 665 Ohm
Lead Channel Pacing Threshold Amplitude: 0.75 V
Lead Channel Pacing Threshold Amplitude: 0.875 V
Lead Channel Pacing Threshold Pulse Width: 0.4 ms
Lead Channel Pacing Threshold Pulse Width: 0.4 ms
Lead Channel Sensing Intrinsic Amplitude: 2.875 mV
Lead Channel Sensing Intrinsic Amplitude: 2.875 mV
Lead Channel Sensing Intrinsic Amplitude: 20.625 mV
Lead Channel Sensing Intrinsic Amplitude: 20.625 mV
Lead Channel Setting Pacing Amplitude: 1.5 V
Lead Channel Setting Pacing Amplitude: 2 V
Lead Channel Setting Pacing Pulse Width: 0.4 ms
Lead Channel Setting Sensing Sensitivity: 1.2 mV
Zone Setting Status: 755011
Zone Setting Status: 755011

## 2023-04-26 NOTE — Progress Notes (Signed)
Remote pacemaker transmission.   

## 2023-06-03 ENCOUNTER — Other Ambulatory Visit: Payer: Self-pay | Admitting: Cardiovascular Disease

## 2023-06-05 MED ORDER — ISOSORBIDE MONONITRATE ER 120 MG PO TB24: ORAL_TABLET | ORAL | 2 refills | Status: DC

## 2023-07-06 ENCOUNTER — Ambulatory Visit (INDEPENDENT_AMBULATORY_CARE_PROVIDER_SITE_OTHER): Payer: Medicare Other

## 2023-07-06 DIAGNOSIS — I442 Atrioventricular block, complete: Secondary | ICD-10-CM | POA: Diagnosis not present

## 2023-07-06 LAB — CUP PACEART REMOTE DEVICE CHECK
Battery Remaining Longevity: 148 mo
Battery Voltage: 3.12 V
Brady Statistic AP VP Percent: 30.16 %
Brady Statistic AP VS Percent: 0.01 %
Brady Statistic AS VP Percent: 68.78 %
Brady Statistic AS VS Percent: 1.06 %
Brady Statistic RA Percent Paced: 30.58 %
Brady Statistic RV Percent Paced: 98.94 %
Date Time Interrogation Session: 20240919042108
Implantable Lead Connection Status: 753985
Implantable Lead Connection Status: 753985
Implantable Lead Implant Date: 20231220
Implantable Lead Implant Date: 20231220
Implantable Lead Location: 753859
Implantable Lead Location: 753860
Implantable Lead Model: 3830
Implantable Lead Model: 5076
Implantable Pulse Generator Implant Date: 20231220
Lead Channel Impedance Value: 323 Ohm
Lead Channel Impedance Value: 380 Ohm
Lead Channel Impedance Value: 380 Ohm
Lead Channel Impedance Value: 646 Ohm
Lead Channel Pacing Threshold Amplitude: 0.625 V
Lead Channel Pacing Threshold Amplitude: 0.875 V
Lead Channel Pacing Threshold Pulse Width: 0.4 ms
Lead Channel Pacing Threshold Pulse Width: 0.4 ms
Lead Channel Sensing Intrinsic Amplitude: 10.375 mV
Lead Channel Sensing Intrinsic Amplitude: 10.375 mV
Lead Channel Sensing Intrinsic Amplitude: 3.125 mV
Lead Channel Sensing Intrinsic Amplitude: 3.125 mV
Lead Channel Setting Pacing Amplitude: 1.5 V
Lead Channel Setting Pacing Amplitude: 2 V
Lead Channel Setting Pacing Pulse Width: 0.4 ms
Lead Channel Setting Sensing Sensitivity: 1.2 mV
Zone Setting Status: 755011
Zone Setting Status: 755011

## 2023-07-17 NOTE — Progress Notes (Signed)
Remote pacemaker transmission.   

## 2023-07-19 DIAGNOSIS — Z794 Long term (current) use of insulin: Secondary | ICD-10-CM | POA: Diagnosis not present

## 2023-07-19 DIAGNOSIS — E1165 Type 2 diabetes mellitus with hyperglycemia: Secondary | ICD-10-CM | POA: Diagnosis not present

## 2023-07-19 NOTE — Progress Notes (Signed)
 Chief Complaint: Chief Complaint  Patient presents with  . Diabetes Mellitus    HPI: Mr.  [name] [name], 77 y.o. male,  coming in for follow up of Type 2 Diabetes Mellitus. Past medical history of CAD s/p CABG in 2006, DM2, HLD, mod-severe AS followed by Cardiology who presents for follow up Type 2 DM.    LV 01/17/23   Reports doing well since last visit.  Had aortic valve replacement with pacemaker placement in December 2023.   Ozempic  ordered at prior visit but stopped due to cost and GI upset.   Currently taking NPH 18 units twice daily and metformin  2000 mg daily.   A1c 7.2% today which has improved from 7.9% in April  No longer checking FSBG   Denies events of hypoglycemia.   PCP-[facility].   DM current regimen and BS control: Current meds-NPH 18 units twice daily and metformin  2000 mg daily  Glucometer- accu chek Frequency of BS monitoring-not monitoring  BS range- no BG trends for review Adherence to regimen-good H/o Hypoglycemia-denies H/o hypoglycemic unawareness-denies  Diabetes history: Type- 2 Previous treatment regimens-Ozempic  (cost and GI upset)  Previous control-7.4% (11/2018) --> 6.9% (04/2019) --> 9.7 % (06/2020) --> 8.0 % (09/28/20) --> 7.9% (12/2020) --> 6.4% (04/2021)--> 7.4% (11/24/21) --> 6.7% (02/2022) 7.1% 06/2022--. 7.9% 01/2023--> 7.2% 10/24  H/o hospitalizations for hyper/hypoglycemia-denies Last CDE/ RD visit-   Complications: Kidney-Denies CKD BP- BP Readings from Last 3 Encounters:  07/19/23 138/65  01/17/23 148/61  07/26/22 (!) 144/59   Eye-h/o cataracts, no retinopathy 2023  Neuropathy- denies Cardiac-h/o CAD s/p bypass x5, PAD. Denies CVA   Other-  Lifestyle:  Diet:  Exercise: walking  Social:married. Has children and grandchildren   Medications: New Medications Ordered This Visit  Medications  . metFORMIN  (GLUCOPHAGE ) 500 mg tablet    Sig: Take 2 tablets (1,000 mg total) 2 times daily with meals.    Dispense:   360 tablet    Refill:  2  . insulin  NPH U-100 (NovoLIN N FlexPen) 100 unit/mL (3 mL) pen    Sig: Inject 18 Units under the skin 2 (two) times a day.    Dispense:  45 mL    Refill:  2      Allergies: Allergies  Allergen Reactions  . Empagliflozin Other (See Comments) and GI Intolerance    Abdominal pain/dizziness    PMH: Past Medical History:  Diagnosis Date  . Arthritis   . Atherosclerosis of native arteries of the extremities with intermittent claudication (CMD) 09/13/2008   Overview:  Annotation: minimal symptoms, right popliteal occlusion Qualifier: Diagnosis of  By: Wonda, MD, Ozell Lenis   Last Assessment & Plan:  Known severe right popliteal artery stenosis. Reserve angiography for progressive symptoms or severe lifestyle limitation because of unfavorable anatomy in the popliteal space.  . Coronary artery disease, occlusive 2007  . Diabetes mellitus (CMD) 2015   HGBA1C on 05/2018 was 10  . Hearing loss   . Hypertension 1993  . 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 Nuclear sclerosis of both eyes 06/10/2016  . Pseudophakia of left eye 08/08/2017   AMO Tecnis PCB00 20.5 diopters, laser assisted  . Right leg DVT (CMD) 2007    Family history: Family History  Problem Relation Name Age of Onset  . Hypertension Mother      Social history: Social History   Socioeconomic History  . Marital status: Married    Spouse name: Not on file  .  Number of children: Not on file  . Years of education: Not on file  . Highest education level: Not on file  Occupational History  . Not on file  Tobacco Use  . Smoking status: Never  . Smokeless tobacco: Never  Substance and Sexual Activity  . Alcohol use: No  . Drug use: No  . Sexual activity: Not on file  Other Topics Concern  . 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  He lives with wife at      Social Determinants of Health    Food Insecurity: No Food Insecurity (10/04/2022)   Received [facility]   Food vital sign   . Within the past 12 months, you worried that your food would run out before you got money to buy more: Never true   . Within the past 12 months, the food you bought just didn't last and you didn't have money to get more: Never true  Transportation Needs: No Transportation Needs (10/04/2022)   Received from [facility] [place]   [facility] - Transportation   . Lack of Transportation (Medical): No   . Lack of Transportation (Non-Medical): No  Safety: Low Risk  (07/19/2023)   Safety   . How often does anyone, including family and friends, physically hurt you?: Never   . How often does anyone, including family and friends, insult or talk down to you?: Never   . How often does anyone, including family and friends, threaten you with harm?: Never   . How often does anyone, including family and friends, scream or curse at you?: Never  Living Situation: Not on file    Review of Systems: Denies current abdominal pain, nausea or vomiting.  Denies leg swelling, chest pain or shortness of breath  Physical Exam: Vitals:   07/19/23 1011  BP: 138/65  Pulse: 72  Resp: 18  SpO2: 98%   Weights (last 90 days)     Date/Time Weight   07/19/23 1011 91.9 kg (202 lb 8 oz)       Gen: Pleasant, well-appearing male, NAD HEENT: PERRL, EOM normal, no lid lag, no stare Oropharynx: Clear, moist, no oropharyngeal exudate Neck: No visible goiter CVS: HR 72 RS: Unlabored breathing pattern Neuro: No tremor Skin: Warm and dry Extremities: No edema Psych: Mood and affect normal   Data: Lab Results  Component Value Date   HGBA1C 6.7 (A) 03/11/2022   Lab Results  Component Value Date   CHOL 156 08/04/2021   TRIG 171 (H) 08/04/2021   HDL 46 (L) 08/04/2021   Lab Results  Component Value Date   CREATININE 1.03 08/04/2021   BUN 19 08/04/2021   NA 136 08/04/2021   K 4.0 08/04/2021   CL 104  08/04/2021   CO2 22 08/04/2021   No results found for: TSH No results found for: WBC, HGB, HCT, MCV, PLT Lab Results  Component Value Date   ALBUR 27 01/17/2023    Immunization History  Administered Date(s) Administered  . Influenza, split virus, trivalent, preservative 07/22/2010  . Pfizer SARS-CoV-2 Primary Series 12+ yrs 01/25/2020, 02/19/2020    Assessment: Type 2 DM In summary, 77 y.o. male with  has a past medical history of Arthritis, Atherosclerosis of native arteries of the extremities with intermittent claudication (CMD) (09/13/2008), Coronary artery disease, occlusive (2007), Diabetes mellitus (CMD) (2015), Hearing loss, Hypertension (1993), Mixed hearing loss, bilateral (04/10/2012), Nasal septal perforation (04/10/2012), Nuclear sclerosis of both eyes (  06/10/2016), Pseudophakia of left eye (08/08/2017), and Right leg DVT (CMD) (2007).here for follow up of Diabetes mellitus  Reviewed recent A1c of 7.2% with patient.  A1c at goal.  A1c is improved from last visit.  Plan to continue current dose of NPH and metformin  at this time.  Plan: Diabetes Mellitus -  A1c target <7.5% with hypoglycemia avoidance Continue NPH 18 units twice daily  Continue metformin  1000 mg twice daily    Please call or message if BG consistently less than 70 or greater than 300 DM-related therapies  ASA: 81 mg ACE/ARB: Benazepril   Statin: Crestor   Dilated eye exam: 02/2023 Foot exam: needs at follow up Flu Shot: annually  Albuminuria Screening: 01/2023 HTN: Most recent BP is at goal <140/90 per ADA guidelines.  Follow-up: 6 months  Electronically signed by: Warren Gleen Batty, FNP 07/19/2023 11:04 AM

## 2023-08-16 DIAGNOSIS — R1319 Other dysphagia: Secondary | ICD-10-CM | POA: Diagnosis not present

## 2023-08-16 DIAGNOSIS — Z1211 Encounter for screening for malignant neoplasm of colon: Secondary | ICD-10-CM | POA: Diagnosis not present

## 2023-08-23 ENCOUNTER — Other Ambulatory Visit: Payer: Self-pay | Admitting: *Deleted

## 2023-08-23 MED ORDER — ISOSORBIDE MONONITRATE ER 120 MG PO TB24: ORAL_TABLET | ORAL | 3 refills | Status: DC

## 2023-08-30 ENCOUNTER — Ambulatory Visit (HOSPITAL_COMMUNITY)
Admission: RE | Admit: 2023-08-30 | Discharge: 2023-08-30 | Disposition: A | Discharge status: Home / Self Care | Payer: Medicare Other | Source: Ambulatory Visit | Attending: Nurse Practitioner | Admitting: Nurse Practitioner

## 2023-08-30 DIAGNOSIS — I6523 Occlusion and stenosis of bilateral carotid arteries: Secondary | ICD-10-CM | POA: Insufficient documentation

## 2023-09-01 ENCOUNTER — Other Ambulatory Visit: Payer: Self-pay | Admitting: Cardiovascular Disease

## 2023-09-04 DIAGNOSIS — R972 Elevated prostate specific antigen [PSA]: Secondary | ICD-10-CM | POA: Diagnosis not present

## 2023-09-11 DIAGNOSIS — N401 Enlarged prostate with lower urinary tract symptoms: Secondary | ICD-10-CM | POA: Diagnosis not present

## 2023-09-11 DIAGNOSIS — R972 Elevated prostate specific antigen [PSA]: Secondary | ICD-10-CM | POA: Diagnosis not present

## 2023-09-11 DIAGNOSIS — R3912 Poor urinary stream: Secondary | ICD-10-CM | POA: Diagnosis not present

## 2023-09-19 NOTE — Progress Notes (Unsigned)
[facility]   MULTIDISCIPLINARY HEART VALVE TEAM  Structural Heart Office Note:  .    Date:  09/20/2023  ID:  [name] [name], DOB Apr 29, 1946, MRN [id number] PCP: Jarrett Soho, PA-C  [facility] Providers Cardiologist:  Tonny Bollman, MD  History of Present Illness: .   [name] [name] is a 77 y.o. male with a hx of CAD s/p CABG 2009, and subsequent multivessel cononary stenting and chronic angina due to small vessel CAD, obesity, PAD s/p fem endart 2022, DM2, HTN, HLD, and severe AS s/p TAVR (10/04/22) c/b CHB s/p PPM (10/05/22) who presents to clinic for one year follow up.   Mr. [name] [name] underwent CABG in 2009 and has had multiple PCI procedures over the past few years. He underwent right common femoral endarterectomy in 2022 to treat severe common femoral artery stenosis and right leg claudication. He had a PCI in 08/2021 and 03/2022 with treatment of the saphenous vein graft to obtuse marginal for treatment given progressive chest pain. He has been followed for moderate aortic stenosis. Surveillance echo on 08/08/22 showed progression from moderate AS to severe AS with severe aortic valve calcification, AVA 0.8 cm2, mean gradient 44.0 mmHg, and peak gradient 75.0 mmHg.  He continued to have anginal chest pain and progressive DOE and despite maximal medical therapy.   He was then evaluated by the multidisciplinary valve team and underwent successful TAVR with a 26 mm Edwards Sapien 3 Ultra Resilia THV via the TF approach on 10/04/22. 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 Elberta Fortis. He was continued on chronic DAPT.   Since he was last seen, he hs done very well and remains active without evidence of SOB, palpitations, LE edema, orthopnea, PND, dizziness, or syncope. Denies bleeding in stool or urine. He continues to have exertional chest pain however this occurs only with brisk  walking or strenuous work. Pain is controlled with NTG and rest. This is unchanged and has been chronic for him. Echo today with normal LVEF at 55-60%, G1DD, and stable 26mm S3UR valve function with a mean gradient at and no PVL.   Physical Exam:   VS:  BP (!) 142/60   Pulse 64   Ht 5\' 9"  (1.753 m)   Wt 204 lb (92.5 kg)   SpO2 97%   BMI 30.13 kg/m    Wt Readings from Last 3 Encounters:  09/20/23 204 lb (92.5 kg)  01/09/23 208 lb (94.3 kg)  01/03/23 204 lb 3.2 oz (92.6 kg)    General: Well developed, well nourished, NAD Lungs:Clear to ausculation bilaterally. No wheezes, rales, or rhonchi. Breathing is unlabored. Cardiovascular: RRR with S1 S2. No murmurs Extremities: No edema.  Neuro: Alert and oriented. No focal deficits. No facial asymmetry. MAE spontaneously. Psych: Responds to questions appropriately with normal affect.    ASSESSMENT AND PLAN: .    Severe AS s/p TAVR: Patient doing well with NYHA class I symptoms s/p TAVR. Echo today with normal LVEF at 55-60% with stable 26mm S3UR valve function with a mean gradient at and no PVL. Continue chronic DAPT and lifelong dental SBE. Plan regular follow up with Dr. Excell Seltzer in 6 months.    CHB s/p PPM: Last device check with short 7 beat NSVT. Otherwise normal lead parameters and battery. No changes.    CAD: s/p CABG in 2009 with multiple PCIs in 08/2021 and 03/2022. Last cath with  distal graft disease however noted to be in a difficult area therefore plan was for continued medical therapy of CAD. Intermittent, unchanged chronic angina. Continue current regimen with DAPT, IMDUR, Ranexa, statin, BB, and PRN SL NYG.    PAD: s/p right fem endarterectomy in 2022. Vascular follows for PAD and carotid stenosis. No claudication symptoms. Continue medical management    HTN: Slightly elevated today however reports home BP in the 120s. Confirmed metoprolol dosing at 50mg  BID (had both 50mg  and 75mg  on med list).    HLD: Continue  statin.   I spent 20 minutes caring for this patient today including face-to-face discussions, ordering and reviewing labs, reviewing records from [facility] and other outside facilities, documenting in the record, and arranging for follow up.    Signed, Georgie Chard, NP

## 2023-09-20 ENCOUNTER — Ambulatory Visit (HOSPITAL_COMMUNITY): Payer: Medicare Other | Attending: Internal Medicine | Admitting: Cardiology

## 2023-09-20 ENCOUNTER — Ambulatory Visit (HOSPITAL_BASED_OUTPATIENT_CLINIC_OR_DEPARTMENT_OTHER): Payer: Medicare Other

## 2023-09-20 VITALS — BP 142/60 | HR 64 | Ht 69.0 in | Wt 204.0 lb

## 2023-09-20 DIAGNOSIS — I739 Peripheral vascular disease, unspecified: Secondary | ICD-10-CM | POA: Diagnosis not present

## 2023-09-20 DIAGNOSIS — Z950 Presence of cardiac pacemaker: Secondary | ICD-10-CM | POA: Diagnosis not present

## 2023-09-20 DIAGNOSIS — Z952 Presence of prosthetic heart valve: Secondary | ICD-10-CM | POA: Insufficient documentation

## 2023-09-20 DIAGNOSIS — I10 Essential (primary) hypertension: Secondary | ICD-10-CM | POA: Insufficient documentation

## 2023-09-20 DIAGNOSIS — I25709 Atherosclerosis of coronary artery bypass graft(s), unspecified, with unspecified angina pectoris: Secondary | ICD-10-CM | POA: Diagnosis not present

## 2023-09-20 DIAGNOSIS — E7800 Pure hypercholesterolemia, unspecified: Secondary | ICD-10-CM | POA: Diagnosis not present

## 2023-09-20 DIAGNOSIS — R1311 Dysphagia, oral phase: Secondary | ICD-10-CM | POA: Diagnosis not present

## 2023-09-20 DIAGNOSIS — Z860100 Personal history of colon polyps, unspecified: Secondary | ICD-10-CM | POA: Diagnosis not present

## 2023-09-20 DIAGNOSIS — I2510 Atherosclerotic heart disease of native coronary artery without angina pectoris: Secondary | ICD-10-CM | POA: Diagnosis not present

## 2023-09-20 DIAGNOSIS — I6523 Occlusion and stenosis of bilateral carotid arteries: Secondary | ICD-10-CM | POA: Insufficient documentation

## 2023-09-20 DIAGNOSIS — I442 Atrioventricular block, complete: Secondary | ICD-10-CM | POA: Diagnosis not present

## 2023-09-20 LAB — ECHOCARDIOGRAM COMPLETE
AR max vel: 1.93 cm2
AV Area VTI: 1.95 cm2
AV Area mean vel: 1.76 cm2
AV Mean grad: 6 mm[Hg]
AV Peak grad: 11 mm[Hg]
Ao pk vel: 1.66 m/s
Area-P 1/2: 2.62 cm2
S' Lateral: 3.5 cm

## 2023-09-20 NOTE — Patient Instructions (Signed)
Medication Instructions:  Your physician recommends that you continue on your current medications as directed. Please refer to the Current Medication list given to you today.  *If you need a refill on your cardiac medications before your next appointment, please call your pharmacy*  Lab Work: If you have labs (blood work) drawn today and your tests are completely normal, you will receive your results only by: MyChart Message (if you have MyChart) OR A paper copy in the mail If you have any lab test that is abnormal or we need to change your treatment, we will call you to review the results.  Follow-Up: At [facility], you and your health needs are our priority.  As part of our continuing mission to provide you with exceptional heart care, we have created designated Provider Care Teams.  These Care Teams include your primary Cardiologist (physician) and Advanced Practice Providers (APPs -  Physician Assistants and Nurse Practitioners) who all work together to provide you with the care you need, when you need it.  We recommend signing up for the patient portal called "MyChart".  Sign up information is provided on this After Visit Summary.  MyChart is used to connect with patients for Virtual Visits (Telemedicine).  Patients are able to view lab/test results, encounter notes, upcoming appointments, etc.  Non-urgent messages can be sent to your provider as well.   To learn more about what you can do with MyChart, go to [email or web address].    Your next appointment:   6 month(s)  Provider:   Tonny Bollman, MD

## 2023-09-25 ENCOUNTER — Telehealth: Payer: Self-pay | Admitting: *Deleted

## 2023-09-25 NOTE — Telephone Encounter (Signed)
   Pre-operative Risk Assessment    Patient Name: [name] [name]  DOB: [birth year] MRN: [id number]  DATE OF LAST VISIT: 09/20/23 JILL McDANIEL DATE OF NEXT VISIT: NONE    Request for Surgical Clearance    Procedure:   COLONOSCOPY/ENDOSCOPY  Date of Surgery:  Clearance 11/16/23                                 Surgeon:  DR. Ewing Schlein Surgeon's Group or Practice Name:  [facility] Phone number:  [phone] Fax number:  [phone]   Type of Clearance Requested:   - Medical  - Pharmacy:  Hold Clopidogrel (Plavix)     Type of Anesthesia:   PROPOFOL   Additional requests/questions:    Elpidio Anis   09/25/2023, 3:44 PM

## 2023-09-26 DIAGNOSIS — Z1331 Encounter for screening for depression: Secondary | ICD-10-CM | POA: Diagnosis not present

## 2023-09-26 DIAGNOSIS — Z6830 Body mass index (BMI) 30.0-30.9, adult: Secondary | ICD-10-CM | POA: Diagnosis not present

## 2023-09-26 DIAGNOSIS — Z0000 Encounter for general adult medical examination without abnormal findings: Secondary | ICD-10-CM | POA: Diagnosis not present

## 2023-09-27 NOTE — Telephone Encounter (Signed)
   Patient Name: [name] [name]  DOB: 1946/01/15 MRN: [id number]  Primary Cardiologist: Tonny Bollman, MD  Chart reviewed as part of pre-operative protocol coverage. Given past medical history and time since last visit, based on ACC/AHA guidelines, [name] [name] is at acceptable risk for the planned procedure without further cardiovascular testing.   He has been treated with DAPT with ASA and Plavix post TAVR and PCI. He is greater than 1 year from last PCI and may therefore hold Plavix 5 days prior and continue ASA throughout the perioperative process.   I will route this recommendation to the requesting party via Epic fax function and remove from pre-op pool.  Please call with questions.  Georgie Chard, NP 09/27/2023, 9:13 AM

## 2023-09-27 NOTE — Telephone Encounter (Signed)
   Patient Name: [name] [name]  DOB: January 15, 1946 MRN: [id number]  Primary Cardiologist: Tonny Bollman, MD  Chart reviewed as part of pre-operative protocol coverage. Given past medical history and time since last visit, based on ACC/AHA guidelines, [name] [name] is at acceptable risk for the planned procedure without further cardiovascular testing.   Patient may hold Plavix 5 days prior to procedure and should continue ASA 81 mg throughout the perioperative period.  The patient was advised that if he develops new symptoms prior to surgery to contact our office to arrange for a follow-up visit, and he verbalized understanding.  I will route this recommendation to the requesting party via Epic fax function and remove from pre-op pool.  Please call with questions.  Napoleon Form, Leodis Rains, NP 09/27/2023, 9:17 AM

## 2023-10-05 ENCOUNTER — Ambulatory Visit (INDEPENDENT_AMBULATORY_CARE_PROVIDER_SITE_OTHER): Payer: Medicare Other

## 2023-10-05 DIAGNOSIS — I442 Atrioventricular block, complete: Secondary | ICD-10-CM | POA: Diagnosis not present

## 2023-10-05 LAB — CUP PACEART REMOTE DEVICE CHECK
Battery Remaining Longevity: 144 mo
Battery Voltage: 3.07 V
Brady Statistic AP VP Percent: 44.4 %
Brady Statistic AP VS Percent: 0.01 %
Brady Statistic AS VP Percent: 54.97 %
Brady Statistic AS VS Percent: 0.62 %
Brady Statistic RA Percent Paced: 44.65 %
Brady Statistic RV Percent Paced: 99.38 %
Date Time Interrogation Session: 20241219032300
Implantable Lead Connection Status: 753985
Implantable Lead Connection Status: 753985
Implantable Lead Implant Date: 20231220
Implantable Lead Implant Date: 20231220
Implantable Lead Location: 753859
Implantable Lead Location: 753860
Implantable Lead Model: 3830
Implantable Lead Model: 5076
Implantable Pulse Generator Implant Date: 20231220
Lead Channel Impedance Value: 323 Ohm
Lead Channel Impedance Value: 380 Ohm
Lead Channel Impedance Value: 399 Ohm
Lead Channel Impedance Value: 627 Ohm
Lead Channel Pacing Threshold Amplitude: 0.625 V
Lead Channel Pacing Threshold Amplitude: 0.875 V
Lead Channel Pacing Threshold Pulse Width: 0.4 ms
Lead Channel Pacing Threshold Pulse Width: 0.4 ms
Lead Channel Sensing Intrinsic Amplitude: 16.375 mV
Lead Channel Sensing Intrinsic Amplitude: 16.375 mV
Lead Channel Sensing Intrinsic Amplitude: 3.375 mV
Lead Channel Sensing Intrinsic Amplitude: 3.375 mV
Lead Channel Setting Pacing Amplitude: 1.5 V
Lead Channel Setting Pacing Amplitude: 2 V
Lead Channel Setting Pacing Pulse Width: 0.4 ms
Lead Channel Setting Sensing Sensitivity: 1.2 mV
Zone Setting Status: 755011
Zone Setting Status: 755011

## 2023-10-24 ENCOUNTER — Other Ambulatory Visit: Payer: Self-pay

## 2023-10-26 ENCOUNTER — Other Ambulatory Visit: Payer: Self-pay

## 2023-10-26 ENCOUNTER — Other Ambulatory Visit: Payer: Self-pay | Admitting: Cardiology

## 2023-10-26 MED ORDER — ISOSORBIDE MONONITRATE ER 120 MG PO TB24: ORAL_TABLET | ORAL | 3 refills | Status: DC

## 2023-10-27 ENCOUNTER — Other Ambulatory Visit: Payer: Self-pay | Admitting: Cardiology

## 2023-11-07 ENCOUNTER — Other Ambulatory Visit: Payer: Self-pay | Admitting: Cardiovascular Disease

## 2023-11-07 NOTE — Progress Notes (Signed)
Remote pacemaker transmission.   

## 2023-11-24 ENCOUNTER — Telehealth: Payer: Self-pay | Admitting: Cardiovascular Disease

## 2023-11-24 NOTE — Telephone Encounter (Signed)
 Called Pharmacy and Pt. Cleared the confusion regarding two RX strengths that Pharmacy had on file.

## 2023-11-24 NOTE — Telephone Encounter (Signed)
*  STAT* If patient is at the pharmacy, call can be transferred to refill team.   1. Which medications need to be refilled? (please list name of each medication and dose if known) isosorbide  mononitrate (IMDUR ) 120 MG 24 hr tablet  2. Which pharmacy/location (including street and city if local pharmacy) is medication to be sent to? [id number] - [place], [place] - [address]  3. Do they need a 30 day or 90 day supply?  90 day supply + refills  Patient states pharmacy told him they haven't received the prescription.

## 2023-12-05 ENCOUNTER — Other Ambulatory Visit: Payer: Self-pay | Admitting: Cardiovascular Disease

## 2023-12-21 ENCOUNTER — Other Ambulatory Visit: Payer: Self-pay | Admitting: *Deleted

## 2023-12-21 DIAGNOSIS — I6523 Occlusion and stenosis of bilateral carotid arteries: Secondary | ICD-10-CM

## 2023-12-26 ENCOUNTER — Encounter: Payer: Self-pay | Admitting: Vascular Surgery

## 2023-12-26 ENCOUNTER — Ambulatory Visit (HOSPITAL_COMMUNITY)
Admission: RE | Admit: 2023-12-26 | Discharge: 2023-12-26 | Disposition: A | Discharge status: Home / Self Care | Payer: Medicare Other | Source: Ambulatory Visit | Attending: Vascular Surgery | Admitting: Vascular Surgery

## 2023-12-26 ENCOUNTER — Ambulatory Visit (INDEPENDENT_AMBULATORY_CARE_PROVIDER_SITE_OTHER): Payer: Medicare Other | Admitting: Vascular Surgery

## 2023-12-26 VITALS — BP 133/75 | HR 68 | Temp 98.1°F | Resp 18 | Ht 69.0 in | Wt 206.3 lb

## 2023-12-26 DIAGNOSIS — I6523 Occlusion and stenosis of bilateral carotid arteries: Secondary | ICD-10-CM | POA: Diagnosis present

## 2023-12-26 NOTE — Progress Notes (Signed)
 Patient name: [name] [name] MRN: [id number] DOB: September 16, 1946 Sex: male  REASON FOR CONSULT: 1 year follow-up for surveillance of carotid artery disease  HPI: [name] [name] is a 78 y.o. male with history of coronary artery disease status post CABG in 2009, diabetes, hypertension, history of NSTEMI, aortic stenosis status post TAVR that presents for surveillance of his carotid artery disease.   He was initially referred for right carotid stenosis after his PCP heard a bruit.  He ultimately had an ultrasound at [facility] radiology that was concerning for 70% right ICA stenosis.  A repeat ultrasound here suggested stenosis less than 50% and we have been following his carotid disease with surveillance ultrasound.   His last duplex here last year suggested 1 to 39% stenosis bilaterally.    No history of stroke or TIA since last visit.  No new concerns today.  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 Excell Seltzer & Dr. Laneta Simmers   Severe aortic stenosis     Past Surgical History:  Procedure Laterality Date   ABDOMINAL AORTOGRAM W/LOWER EXTREMITY N/A 12/30/2020   Procedure: ABDOMINAL AORTOGRAM W/LOWER EXTREMITY;  Surgeon: Iran Ouch, MD;  Location: [facility] INVASIVE CV LAB;  Service: Cardiovascular;  Laterality: N/A;   ADENOIDECTOMY     CARDIAC CATHETERIZATION N/A 07/18/2016   Procedure: Left Heart Cath and Cors/Grafts Angiography;  Surgeon: Kathleene Hazel, MD;  Location: [facility] INVASIVE CV LAB;  Service: Cardiovascular;  Laterality:  N/A;   CARDIAC CATHETERIZATION N/A 07/18/2016   Procedure: Coronary Stent Intervention;  Surgeon: Kathleene Hazel, MD;  Location: [facility] INVASIVE CV LAB;  Service: Cardiovascular;  Laterality: N/A;   CHOLECYSTECTOMY     CORONARY ANGIOPLASTY WITH STENT PLACEMENT     CORONARY ARTERY BYPASS GRAFT     CORONARY BALLOON ANGIOPLASTY N/A 04/01/2022   Procedure: CORONARY BALLOON ANGIOPLASTY;  Surgeon: Corky Crafts, MD;  Location: [facility] INVASIVE CV LAB;  Service: Cardiovascular;  Laterality: N/A;  SVG-OM1   CORONARY BALLOON ANGIOPLASTY N/A 09/10/2021   Procedure: CORONARY BALLOON ANGIOPLASTY;  Surgeon: Tonny Bollman, MD;  Location: [facility] INVASIVE CV LAB;  Service: Cardiovascular;  Laterality: N/A;   CORONARY STENT INTERVENTION N/A 01/11/2019   Procedure: CORONARY STENT INTERVENTION;  Surgeon: Tonny Bollman, MD;  Location: [facility] INVASIVE CV LAB;  Service: Cardiovascular;  Laterality: N/A;   ENDARTERECTOMY FEMORAL Right 01/13/2021   Procedure: RIGHT FEMORAL ENDARTERECTOMY  WITH PATCH ANGIOPLASTY;  Surgeon: Larina Earthly, MD;  Location: [facility] OR;  Service: Vascular;  Laterality: Right;   INGUINAL HERNIA REPAIR Right    INTRAOPERATIVE TRANSTHORACIC ECHOCARDIOGRAM N/A 10/04/2022   Procedure: INTRAOPERATIVE TRANSTHORACIC ECHOCARDIOGRAM;  Surgeon: Tonny Bollman, MD;  Location: [facility] OR;  Service: Open Heart Surgery;  Laterality: N/A;   LEFT HEART CATH AND CORS/GRAFTS ANGIOGRAPHY N/A 01/11/2019   Procedure: LEFT HEART CATH AND CORS/GRAFTS ANGIOGRAPHY;  Surgeon: Tonny Bollman, MD;  Location: [facility] INVASIVE CV LAB;  Service: Cardiovascular;  Laterality: N/A;   LEFT HEART  CATH AND CORS/GRAFTS ANGIOGRAPHY N/A 04/01/2022   Procedure: LEFT HEART CATH AND CORS/GRAFTS ANGIOGRAPHY;  Surgeon: Corky Crafts, MD;  Location: [facility] INVASIVE CV LAB;  Service: Cardiovascular;  Laterality: N/A;   LEFT HEART CATH AND CORS/GRAFTS ANGIOGRAPHY N/A 09/10/2021   Procedure: LEFT HEART CATH AND CORS/GRAFTS ANGIOGRAPHY;  Surgeon: Tonny Bollman, MD;  Location: [facility] INVASIVE CV LAB;  Service: Cardiovascular;  Laterality: N/A;   PACEMAKER IMPLANT N/A 10/05/2022   Procedure: PACEMAKER IMPLANT;  Surgeon: Regan Lemming, MD;  Location: [facility] INVASIVE CV LAB;  Service: Cardiovascular;  Laterality: N/A;   Right femoral endarterectomy and Dacron patch angioplasty  01/13/3021   TONSILLECTOMY     TRANSCATHETER AORTIC VALVE REPLACEMENT, TRANSFEMORAL Left 10/04/2022   Procedure: Transcatheter Aortic Valve Replacement, Transfemoral;  Surgeon: Tonny Bollman, MD;  Location: [facility] OR;  Service: Open Heart Surgery;  Laterality: Left;    Family History  Problem Relation Age of Onset   Heart disease Mother        No details   Diabetes Neg Hx     SOCIAL HISTORY: Social History   Socioeconomic History   Marital status: Married    Spouse name: Not on file   Number of children: Not on file   Years of education: Not on file   Highest education level: Not on file  Occupational History   Not on file  Tobacco Use   Smoking status: Never   Smokeless tobacco: Never  Vaping Use   Vaping status: Never Used  Substance and Sexual Activity   Alcohol use: No   Drug use: No   Sexual activity: Not on file  Other Topics Concern   Not on file  Social History Narrative   Not on file   Social Drivers of Health   Financial Resource Strain: Not on file  Food Insecurity: No Food Insecurity (10/04/2022)   Hunger Vital Sign    Worried About Running Out of Food in the Last Year: Never true    Ran Out of Food in the Last Year: Never true  Transportation Needs: No Transportation Needs (10/04/2022)   PRAPARE - Administrator, Civil Service (Medical): No    Lack of Transportation (Non-Medical): No  Physical Activity: Not on file  Stress: Not on file  Social Connections: Not on file  Intimate Partner Violence: Not At Risk (10/04/2022)   Humiliation, Afraid, Rape, and Kick questionnaire    Fear of Current or Ex-Partner: No     Emotionally Abused: No    Physically Abused: No    Sexually Abused: No    Allergies  Allergen Reactions   Empagliflozin Other (See Comments)    Abdominal pain/dizziness    Current Outpatient Medications  Medication Sig Dispense Refill   amLODipine (NORVASC) 10 MG tablet TAKE 1 TABLET BY MOUTH EVERY DAY 90 tablet 3   amoxicillin (AMOXIL) 500 MG tablet Take 4 tablets by mouth 1 hour prior to dental procedures and cleanings. 12 tablet 6   aspirin 81 MG EC tablet TAKE 1 TABLET BY MOUTH EVERY DAY 90 tablet 3   benazepril (LOTENSIN) 40 MG tablet Take 40 mg by mouth daily.      clopidogrel (PLAVIX) 75 MG tablet TAKE 1 TABLET BY MOUTH EVERY DAY 90 tablet 3   finasteride (PROSCAR) 5 MG tablet Take 5 mg by mouth daily.     glucose blood (PRECISION QID TEST) test strip 1 each by Other route as directed.     hydrochlorothiazide (HYDRODIURIL) 25 MG tablet  TAKE 1 TABLET (25 MG TOTAL) BY MOUTH DAILY. 90 tablet 2   Insulin Pen Needle (B-D UF III MINI PEN NEEDLES) 31G X 5 MM MISC Use to inject Ozempic once a week     isosorbide mononitrate (IMDUR) 120 MG 24 hr tablet Take 1/2 tablet by mouth in the morning and at bedtime 90 tablet 3   metFORMIN (GLUCOPHAGE) 1000 MG tablet Take 1,000 mg by mouth 2 (two) times daily with a meal.     metoprolol tartrate (LOPRESSOR) 50 MG tablet TAKE 1 TABLET BY MOUTH TWICE A DAY 180 tablet 3   Multiple Vitamin (MULTIVITAMIN WITH MINERALS) TABS tablet Take 1 tablet by mouth daily.     nitroGLYCERIN (NITROSTAT) 0.4 MG SL tablet DISSOLVE 1 TABLET UNDER THE TONGUE EVERY 5 MINUTES AS NEEDED FOR CHEST PAIN. MAX OF 3 DOSES, THEN 911. 25 tablet 11   NOVOLIN N FLEXPEN 100 UNIT/ML FlexPen Inject 15 Units into the skin 2 (two) times daily.     ranolazine (RANEXA) 500 MG 12 hr tablet TAKE 1 TABLET BY MOUTH TWICE A DAY 180 tablet 3   rosuvastatin (CRESTOR) 40 MG tablet Take 1 tablet (40 mg total) by mouth daily. (Patient taking differently: Take 20 mg by mouth daily.) 90 tablet 3   No  current facility-administered medications for this visit.    REVIEW OF SYSTEMS:  [X]  denotes positive finding, [ ]  denotes negative finding Cardiac  Comments:  Chest pain or chest pressure:    Shortness of breath upon exertion:    Short of breath when lying flat:    Irregular heart rhythm:        Vascular    Pain in calf, thigh, or hip brought on by ambulation:    Pain in feet at night that wakes you up from your sleep:     Blood clot in your veins:    Leg swelling:         Pulmonary    Oxygen at home:    Productive cough:     Wheezing:         Neurologic    Sudden weakness in arms or legs:     Sudden numbness in arms or legs:     Sudden onset of difficulty speaking or slurred speech:    Temporary loss of vision in one eye:     Problems with dizziness:         Gastrointestinal    Blood in stool:     Vomited blood:         Genitourinary    Burning when urinating:     Blood in urine:        Psychiatric    Major depression:         Hematologic    Bleeding problems:    Problems with blood clotting too easily:        Skin    Rashes or ulcers:        Constitutional    Fever or chills:      PHYSICAL EXAM: Vitals:   12/26/23 1436 12/26/23 1438  BP: 121/65 133/75  Pulse: 68   Resp: 18   Temp: 98.1 F (36.7 C)   TempSrc: Temporal   SpO2: 96%   Weight: 206 lb 4.8 oz (93.6 kg)   Height: 5\' 9"  (1.753 m)     GENERAL: The patient is a well-nourished male, in no acute distress. The vital signs are documented above. CARDIAC: There is a regular rate and rhythm.  VASCULAR:  No neck incisions. PULMONARY: No respiratory distress. ABDOMEN: Soft and non-tender. MUSCULOSKELETAL: There are no major deformities or cyanosis. NEUROLOGIC: No focal weakness or paresthesias are detected.  CN II-XII grossly intact.  DATA:   Carotid duplex today shows 40 to 59% right ICA and 1-39 left ICA stenosis  Assessment/Plan:  78 year old male that presents for 1 year follow-up  for surveillance of his carotid artery disease.  His carotid artery disease remains asymptomatic.  There was previous concern for a moderate right ICA stenosis and we have never been able to demonstrate that on duplex in our office.  Carotid duplex in our office today shows a 40 to 59% right ICA stenosis and a 1 to 39% left ICA stenosis.  He has asymptomatic carotid disease.  I discussed for asymptomatic carotid disease recommend surgery for greater than 80% stenosis for stroke risk reduction.  No indication for surgery at this time.  Will see him in 1 year.  He is on aspirin statin Plavix for risk reduction.   Cephus Shelling, MD [facility] Office: [phone]  Cephus Shelling

## 2024-01-04 ENCOUNTER — Ambulatory Visit: Payer: Medicare Other

## 2024-01-04 DIAGNOSIS — I442 Atrioventricular block, complete: Secondary | ICD-10-CM

## 2024-01-05 LAB — CUP PACEART REMOTE DEVICE CHECK
Battery Remaining Longevity: 141 mo
Battery Voltage: 3.05 V
Brady Statistic AP VP Percent: 28.93 %
Brady Statistic AP VS Percent: 0 %
Brady Statistic AS VP Percent: 70.62 %
Brady Statistic AS VS Percent: 0.45 %
Brady Statistic RA Percent Paced: 29.11 %
Brady Statistic RV Percent Paced: 99.55 %
Date Time Interrogation Session: 20250320022909
Implantable Lead Connection Status: 753985
Implantable Lead Connection Status: 753985
Implantable Lead Implant Date: 20231220
Implantable Lead Implant Date: 20231220
Implantable Lead Location: 753859
Implantable Lead Location: 753860
Implantable Lead Model: 3830
Implantable Lead Model: 5076
Implantable Pulse Generator Implant Date: 20231220
Lead Channel Impedance Value: 323 Ohm
Lead Channel Impedance Value: 380 Ohm
Lead Channel Impedance Value: 399 Ohm
Lead Channel Impedance Value: 608 Ohm
Lead Channel Pacing Threshold Amplitude: 0.75 V
Lead Channel Pacing Threshold Amplitude: 0.875 V
Lead Channel Pacing Threshold Pulse Width: 0.4 ms
Lead Channel Pacing Threshold Pulse Width: 0.4 ms
Lead Channel Sensing Intrinsic Amplitude: 12 mV
Lead Channel Sensing Intrinsic Amplitude: 12 mV
Lead Channel Sensing Intrinsic Amplitude: 3.625 mV
Lead Channel Sensing Intrinsic Amplitude: 3.625 mV
Lead Channel Setting Pacing Amplitude: 1.5 V
Lead Channel Setting Pacing Amplitude: 2 V
Lead Channel Setting Pacing Pulse Width: 0.4 ms
Lead Channel Setting Sensing Sensitivity: 1.2 mV
Zone Setting Status: 755011
Zone Setting Status: 755011

## 2024-01-24 ENCOUNTER — Ambulatory Visit: Payer: Medicare Other | Attending: Cardiology | Admitting: Cardiology

## 2024-01-24 ENCOUNTER — Encounter: Payer: Self-pay | Admitting: Cardiology

## 2024-01-24 VITALS — BP 150/72 | HR 67 | Ht 69.0 in | Wt 207.0 lb

## 2024-01-24 DIAGNOSIS — I442 Atrioventricular block, complete: Secondary | ICD-10-CM | POA: Diagnosis present

## 2024-01-24 DIAGNOSIS — Z952 Presence of prosthetic heart valve: Secondary | ICD-10-CM | POA: Insufficient documentation

## 2024-01-24 DIAGNOSIS — Z950 Presence of cardiac pacemaker: Secondary | ICD-10-CM | POA: Insufficient documentation

## 2024-01-24 DIAGNOSIS — I25118 Atherosclerotic heart disease of native coronary artery with other forms of angina pectoris: Secondary | ICD-10-CM | POA: Insufficient documentation

## 2024-01-24 LAB — CUP PACEART INCLINIC DEVICE CHECK
Date Time Interrogation Session: 20250409163802
Implantable Lead Connection Status: 753985
Implantable Lead Connection Status: 753985
Implantable Lead Implant Date: 20231220
Implantable Lead Implant Date: 20231220
Implantable Lead Location: 753859
Implantable Lead Location: 753860
Implantable Lead Model: 3830
Implantable Lead Model: 5076
Implantable Pulse Generator Implant Date: 20231220

## 2024-01-24 NOTE — Patient Instructions (Signed)
 Medication Instructions:  Your physician recommends that you continue on your current medications as directed. Please refer to the Current Medication list given to you today.  *If you need a refill on your cardiac medications before your next appointment, please call your pharmacy*  Lab Work: None ordered.  If you have labs (blood work) drawn today and your tests are completely normal, you will receive your results only by: MyChart Message (if you have MyChart) OR A paper copy in the mail If you have any lab test that is abnormal or we need to change your treatment, we will call you to review the results.  Testing/Procedures: None ordered.   Follow-Up: At [facility], you and your health needs are our priority.  As part of our continuing mission to provide you with exceptional heart care, our providers are all part of one team.  This team includes your primary Cardiologist (physician) and Advanced Practice Providers or APPs (Physician Assistants and Nurse Practitioners) who all work together to provide you with the care you need, when you need it.  Your next appointment:    12 months with Dr Elberta Fortis PA      1st Floor: - Lobby - Registration  - Pharmacy  - Lab - Cafe  2nd Floor: - PV Lab - Diagnostic Testing (echo, CT, nuclear med)  3rd Floor: - Vacant  4th Floor: - TCTS (cardiothoracic surgery) - AFib Clinic - Structural Heart Clinic - Vascular Surgery  - Vascular Ultrasound  5th Floor: - HeartCare Cardiology (general and EP) - Clinical Pharmacy for coumadin, hypertension, lipid, weight-loss medications, and med management appointments    Valet parking services will be available as well.

## 2024-01-24 NOTE — Progress Notes (Signed)
  Electrophysiology Office Note:   Date:  01/24/2024  ID:  [name] [name], DOB 05-20-1946, MRN [id number]  Primary Cardiologist: Tonny Bollman, MD Primary Heart Failure: None Electrophysiologist: Zan Orlick Jorja Loa, MD      History of Present Illness:   [name] [name] is a 78 y.o. male with h/o coronary artery disease post CABG, P AD, hypertension, hyperlipidemia, diabetes, valvular heart disease with severe AS post TAVR, complete heart block seen today for routine electrophysiology followup.   Since last being seen in our clinic the patient reports doing well.  He is able to do all of his daily activities.  He does get some chest pain.  He has chronic stable angina.  This is relieved with nitroglycerin.  Aside from that, he has no acute complaints.  he denies palpitations, dyspnea, PND, orthopnea, nausea, vomiting, dizziness, syncope, edema, weight gain, or early satiety.   Review of systems complete and found to be negative unless listed in HPI.      EP Information / Studies Reviewed:    EKG is ordered today. Personal review as below.  EKG Interpretation Date/Time:  Wednesday January 24 2024 14:00:13 EDT Ventricular Rate:  67 PR Interval:  196 QRS Duration:  168 QT Interval:  484 QTC Calculation: 511 R Axis:   109  Text Interpretation: Normal sinus rhythm VENTRICULAR PACING When compared with ECG of 06-Oct-2022 06:48, Sinus rhythm has replaced Electronic ventricular pacemaker Confirmed by Yoneko Talerico ([id number]) on 01/24/2024 2:27:40 PM   PPM Interrogation-  reviewed in detail today,  See PACEART report.  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  Risk Assessment/Calculations:             Physical Exam:   VS:  BP (!) 150/72 (BP Location: Left Arm, Patient Position: Sitting, Cuff Size: Large)   Pulse 67   Ht 5\' 9"  (1.753 m)   Wt 207 lb (93.9 kg)   SpO2 96%   BMI 30.57 kg/m    Wt Readings from Last 3 Encounters:  01/24/24 207 lb (93.9 kg)   12/26/23 206 lb 4.8 oz (93.6 kg)  09/20/23 204 lb (92.5 kg)     GEN: Well nourished, well developed in no acute distress NECK: No JVD; No carotid bruits CARDIAC: Regular rate and rhythm, no murmurs, rubs, gallops RESPIRATORY:  Clear to auscultation without rales, wheezing or rhonchi  ABDOMEN: Soft, non-tender, non-distended EXTREMITIES:  No edema; No deformity   ASSESSMENT AND PLAN:    CHB s/p Medtronic PPM  Normal PPM function See Pace Art report Sensing, threshold, impedance within normal limits Programming reviewed and appropriate for patient No changes today  2.  Severe aortic stenosis: Post TAVR.  Plan per primary team  3.  Coronary artery disease chronic stable angina: Post CABG with multiple PCI's.  No current chest pain.  Plan per primary cardiology  4.  PAD: Post right femoral endarterectomy.  Followed by vascular surgery  5.  Hypertension: Mildly elevated today.  Usually well-controlled.  No changes.  Disposition:   Follow up with EP APP in 12 months  Signed, Dylan Ruotolo Jorja Loa, MD

## 2024-02-12 NOTE — Progress Notes (Unsigned)
 Cardiology Office Note   Date:  02/13/2024   ID:  [name], [name] 1945-11-04, MRN [id number]  PCP:  Darnelle Elders, PA-C  Cardiologist: Dr. Arlester Ladd  No chief complaint on file.      History of Present Illness: [name] [name] is a 78 y.o. male who is here today for follow-up visit regarding peripheral arterial disease.   He has known history of coronary artery disease status post CABG and subsequent PCI, aortic stenosis status post TAVR, permanent pacemaker placement, essential hypertension, hyperlipidemia, diabetes mellitus, carotid disease and peripheral arterial disease.    He was seen for severe claudication in 2022.  Angiography was performed in March 2022 which showed no significant aortoiliac disease.  There was severe heavily calcified disease affecting the right common femoral artery into the ostium of the profunda with severe TP trunk stenosis and proximal anterior tibial artery disease with collaterals.  He was treated with right common femoral artery endarterectomy.  He has been doing very well since then with resolution of claudication. He underwent TAVR and pacemaker placement in 2023.   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 He has known history of stable angina but reports slight worsening of symptoms and more frequent use of nitroglycerin .  He usually has to stop and rest for 5 to 10 minutes to allow symptoms to disappear before he can resume.  He also carries nitroglycerin  with him.  He has no lower extremity claudication.   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 Arlester Ladd & Dr. Sherene Dilling   Severe aortic stenosis     Past Surgical History:  Procedure Laterality Date   ABDOMINAL AORTOGRAM W/LOWER EXTREMITY N/A 12/30/2020   Procedure: ABDOMINAL AORTOGRAM W/LOWER EXTREMITY;  Surgeon: Wenona Hamilton, MD;  Location: [facility] INVASIVE CV LAB;  Service: Cardiovascular;  Laterality: N/A;   ADENOIDECTOMY     CARDIAC CATHETERIZATION N/A 07/18/2016   Procedure: Left Heart Cath and Cors/Grafts Angiography;  Surgeon: Odie Benne, MD;  Location: [facility] INVASIVE CV LAB;  Service: Cardiovascular;  Laterality: N/A;   CARDIAC CATHETERIZATION N/A 07/18/2016   Procedure: Coronary Stent Intervention;  Surgeon: Odie Benne, MD;  Location: [facility] INVASIVE CV LAB;  Service: Cardiovascular;  Laterality: N/A;   CHOLECYSTECTOMY     CORONARY ANGIOPLASTY WITH STENT PLACEMENT     CORONARY ARTERY BYPASS GRAFT     CORONARY BALLOON ANGIOPLASTY N/A 04/01/2022   Procedure: CORONARY BALLOON ANGIOPLASTY;  Surgeon: Lucendia Rusk, MD;  Location: [facility] INVASIVE CV LAB;  Service: Cardiovascular;  Laterality: N/A;  SVG-OM1   CORONARY BALLOON ANGIOPLASTY N/A 09/10/2021   Procedure: CORONARY BALLOON ANGIOPLASTY;  Surgeon: Arnoldo Lapping, MD;  Location: [facility] INVASIVE CV LAB;  Service: Cardiovascular;  Laterality: N/A;   CORONARY STENT INTERVENTION N/A 01/11/2019   Procedure: CORONARY STENT INTERVENTION;  Surgeon: Arnoldo Lapping, MD;  Location: [facility] INVASIVE CV LAB;  Service: Cardiovascular;  Laterality: N/A;   ENDARTERECTOMY FEMORAL Right 01/13/2021   Procedure: RIGHT FEMORAL ENDARTERECTOMY  WITH PATCH ANGIOPLASTY;  Surgeon: Mayo Speck, MD;  Location: [facility] OR;  Service: Vascular;  Laterality: Right;   INGUINAL HERNIA REPAIR Right    INTRAOPERATIVE TRANSTHORACIC ECHOCARDIOGRAM N/A 10/04/2022   Procedure: INTRAOPERATIVE TRANSTHORACIC ECHOCARDIOGRAM;  Surgeon: Arnoldo Lapping,  MD;  Location: [facility] OR;  Service: Open Heart Surgery;  Laterality: N/A;   LEFT HEART CATH AND CORS/GRAFTS ANGIOGRAPHY N/A 01/11/2019   Procedure: LEFT HEART CATH AND CORS/GRAFTS ANGIOGRAPHY;  Surgeon: Arnoldo Lapping, MD;  Location: [facility] INVASIVE CV LAB;  Service: Cardiovascular;  Laterality: N/A;   LEFT HEART CATH AND CORS/GRAFTS ANGIOGRAPHY N/A 04/01/2022   Procedure: LEFT HEART CATH AND CORS/GRAFTS ANGIOGRAPHY;  Surgeon: Lucendia Rusk, MD;  Location: [facility] INVASIVE CV LAB;  Service: Cardiovascular;  Laterality: N/A;   LEFT HEART CATH AND CORS/GRAFTS ANGIOGRAPHY N/A 09/10/2021   Procedure: LEFT HEART CATH AND CORS/GRAFTS ANGIOGRAPHY;  Surgeon: Arnoldo Lapping, MD;  Location: [facility] INVASIVE CV LAB;  Service: Cardiovascular;  Laterality: N/A;   PACEMAKER IMPLANT N/A 10/05/2022   Procedure: PACEMAKER IMPLANT;  Surgeon: Lei Pump, MD;  Location: [facility] INVASIVE CV LAB;  Service: Cardiovascular;  Laterality: N/A;   Right femoral endarterectomy and Dacron patch angioplasty  01/13/3021   TONSILLECTOMY     TRANSCATHETER AORTIC VALVE REPLACEMENT, TRANSFEMORAL Left 10/04/2022   Procedure: Transcatheter Aortic Valve Replacement, Transfemoral;  Surgeon: Arnoldo Lapping, MD;  Location: [facility] OR;  Service: Open Heart Surgery;  Laterality: Left;     Current Outpatient Medications  Medication Sig Dispense Refill   amLODipine  (NORVASC ) 10 MG tablet TAKE 1 TABLET BY MOUTH EVERY DAY 90 tablet 3   amoxicillin  (AMOXIL ) 500 MG tablet Take 4 tablets by mouth 1 hour prior to dental procedures and cleanings. 12 tablet 6   aspirin  81 MG EC tablet TAKE 1 TABLET BY MOUTH EVERY DAY 90 tablet 3   benazepril  (LOTENSIN ) 40 MG tablet Take 40 mg by mouth daily.      clopidogrel  (PLAVIX ) 75 MG tablet TAKE 1 TABLET BY MOUTH EVERY DAY 90 tablet 3   finasteride  (PROSCAR ) 5 MG tablet Take 5 mg by mouth daily.     glucose blood (PRECISION QID TEST) test strip 1 each by Other route as directed.     hydrochlorothiazide   (HYDRODIURIL ) 25 MG tablet TAKE 1 TABLET (25 MG TOTAL) BY MOUTH DAILY. 90 tablet 2   Insulin  Pen Needle (B-D UF III MINI PEN NEEDLES) 31G X 5 MM MISC Use to inject Ozempic  once a week     isosorbide  mononitrate (IMDUR ) 120 MG 24 hr tablet Take 1/2 tablet by mouth in the morning and at bedtime 90 tablet 3   metFORMIN  (GLUCOPHAGE ) 1000 MG tablet Take 1,000 mg by mouth 2 (two) times daily with a meal.     metoprolol  tartrate (LOPRESSOR ) 50 MG tablet TAKE 1 TABLET BY MOUTH TWICE A DAY 180 tablet 3   Multiple Vitamin (MULTIVITAMIN WITH MINERALS) TABS tablet Take 1 tablet by mouth daily.     nitroGLYCERIN  (NITROSTAT ) 0.4 MG SL tablet DISSOLVE 1 TABLET UNDER THE TONGUE EVERY 5 MINUTES AS NEEDED FOR CHEST PAIN. MAX OF 3 DOSES, THEN 911. 25 tablet 11   NOVOLIN N FLEXPEN 100 UNIT/ML FlexPen Inject 15 Units into the skin 2 (two) times daily.     ranolazine  (RANEXA ) 500 MG 12 hr tablet TAKE 1 TABLET BY MOUTH TWICE A DAY 180 tablet 3   rosuvastatin  (CRESTOR ) 40 MG tablet Take  1 tablet (40 mg total) by mouth daily. (Patient taking differently: Take 20 mg by mouth daily.) 90 tablet 3   No current facility-administered medications for this visit.    Allergies:   Empagliflozin    Social History:  The patient  reports that he has never smoked. He has never used smokeless tobacco. He reports that he does not drink alcohol and does not use drugs.   Family History:  The patient's family history includes Heart disease in his mother.    ROS:  Please see the history of present illness.   Otherwise, review of systems are positive for none.   All other systems are reviewed and negative.    PHYSICAL EXAM: VS:  BP (!) 146/74 (BP Location: Left Arm, Patient Position: Sitting)   Pulse 76   Ht 5\' 9"  (1.753 m)   Wt 208 lb 9.6 oz (94.6 kg)   SpO2 90%   BMI 30.80 kg/m  , BMI Body mass index is 30.8 kg/m. GEN: Well nourished, well developed, in no acute distress  HEENT: normal  Neck: no JVD, carotid bruits, or  masses Cardiac: RRR; no  rubs, or gallops,no edema .  1/6 systolic murmur in the aortic area which is mid peaking Respiratory:  clear to auscultation bilaterally, normal work of breathing GI: soft, nontender, nondistended, + BS MS: no deformity or atrophy  Skin: warm and dry, no rash Neuro:  Strength and sensation are intact Psych: euthymic mood, full affect    EKG:  EKG is not ordered today.  Recent Labs: No results found for requested labs within last 365 days.    Lipid Panel    Component Value Date/Time   CHOL 127 11/09/2022 0000   TRIG 163 (H) 11/09/2022 0000   HDL 35 (L) 11/09/2022 0000   CHOLHDL 3.6 11/09/2022 0000   CHOLHDL 2.5 01/14/2021 0105   VLDL 26 01/14/2021 0105   LDLCALC 64 11/09/2022 0000      Wt Readings from Last 3 Encounters:  02/13/24 208 lb 9.6 oz (94.6 kg)  01/24/24 207 lb (93.9 kg)  12/26/23 206 lb 4.8 oz (93.6 kg)           No data to display            ASSESSMENT AND PLAN:  1.  Peripheral arterial disease: Status post right common femoral artery endarterectomy for severe claudication.  I requested a follow-up ABI and lower extremity arterial duplex for surveillance.  2.  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  This affects his ability to work.  I requested cardiac PET scan.  Most recent cardiac catheterization in 2023 showed significant restenosis and SVG to OM treated with balloon angioplasty.  Suspect restenosis.  If there is significant ischemia on stress test, he might benefit from repeat cath and consideration for drug-coated balloon angioplasty.  3.  Hyperlipidemia: Continue high-dose rosuvastatin .  4.  Essential hypertension: Blood pressure is controlled on current medications.  5.  Status post TAVR for severe aortic stenosis: Most recent echo showed normal functioning TAVR prosthesis.    Disposition:   FU with  me in 12 months.  Follow-up with Dr. Arlester Ladd is already planned in early June.  Signed,  Antionette Kirks, MD  02/13/2024 8:44 AM    [facility]

## 2024-02-13 ENCOUNTER — Encounter: Payer: Self-pay | Admitting: Cardiovascular Disease

## 2024-02-13 ENCOUNTER — Ambulatory Visit: Attending: Cardiovascular Disease | Admitting: Cardiovascular Disease

## 2024-02-13 VITALS — BP 146/74 | HR 76 | Ht 69.0 in | Wt 208.6 lb

## 2024-02-13 DIAGNOSIS — I25118 Atherosclerotic heart disease of native coronary artery with other forms of angina pectoris: Secondary | ICD-10-CM | POA: Diagnosis present

## 2024-02-13 DIAGNOSIS — R079 Chest pain, unspecified: Secondary | ICD-10-CM | POA: Insufficient documentation

## 2024-02-13 DIAGNOSIS — I739 Peripheral vascular disease, unspecified: Secondary | ICD-10-CM | POA: Diagnosis present

## 2024-02-13 DIAGNOSIS — I10 Essential (primary) hypertension: Secondary | ICD-10-CM | POA: Insufficient documentation

## 2024-02-13 DIAGNOSIS — E785 Hyperlipidemia, unspecified: Secondary | ICD-10-CM | POA: Diagnosis present

## 2024-02-13 NOTE — Patient Instructions (Addendum)
 Medication Instructions:  No changes *If you need a refill on your cardiac medications before your next appointment, please call your pharmacy*  Lab Work: None ordered If you have labs (blood work) drawn today and your tests are completely normal, you will receive your results only by: MyChart Message (if you have MyChart) OR A paper copy in the mail If you have any lab test that is abnormal or we need to change your treatment, we will call you to review the results.  Testing/Procedures: CARDIAC PET- Your physician has requested that you have a Cardiac Pet Stress Test.   This testing is completed at [facility] ([address] [place], [place] [address]) or [facility] ([address], [place], [place]). Please arrive 30 minutes prior to your scheduled time.  The schedulers will call you to get this scheduled. Please follow further testing instructions below.  Your physician has requested that you have a lower extremity arterial duplex. During this test, ultrasound is used to evaluate arterial blood flow in the legs. Allow one hour for this exam. There are no restrictions or special instructions.   Your physician has requested that you have an ankle brachial index (ABI). During this test an ultrasound and blood pressure cuff are used to evaluate the arteries that supply the arms and legs with blood. Allow thirty minutes for this exam. There are no restrictions or special instructions.    Please note: We ask at that you not bring children with you during ultrasound (echo/ vascular) testing. Due to room size and safety concerns, children are not allowed in the ultrasound rooms during exams. Our front office staff cannot provide observation of children in our lobby area while testing is being conducted. An adult accompanying a patient to their appointment will only be allowed in the ultrasound room at the discretion of the ultrasound technician  under special circumstances. We apologize for any inconvenience.    Follow-Up: At [facility], you and your health needs are our priority.  As part of our continuing mission to provide you with exceptional heart care, our providers are all part of one team.  This team includes your primary Cardiologist (physician) and Advanced Practice Providers or APPs (Physician Assistants and Nurse Practitioners) who all work together to provide you with the care you need, when you need it.  Your next appointment:   12 month(s)  Provider:   Dr. Alvenia Aus  We recommend signing up for the patient portal called "MyChart".  Sign up information is provided on this After Visit Summary.  MyChart is used to connect with patients for Virtual Visits (Telemedicine).  Patients are able to view lab/test results, encounter notes, upcoming appointments, etc.  Non-urgent messages can be sent to your provider as well.   To learn more about what you can do with MyChart, go to [email or web address].   Other Instructions    Please report to Radiology at the [facility] Main Entrance 30 minutes early for your test.  [address] [place], [place] [address]                         OR   Please report to Radiology at [facility] Main Entrance, medical mall, 30 mins prior to your test.  [address]  [place], [place]  How to Prepare for Your Cardiac PET/CT Stress Test:  Nothing to eat or drink, except water, 3 hours prior  to arrival time.  NO caffeine/decaffeinated products, or chocolate 12 hours prior to arrival. (Please note decaffeinated beverages (teas/coffees) still contain caffeine).  If you have caffeine within 12 hours prior, the test will need to be rescheduled.  Medication instructions: Do not take erectile dysfunction medications for 72 hours prior to test (sildenafil, tadalafil) Do not take nitrates (isosorbide  mononitrate, Ranexa ) the day before or day of  test Hold the Hydrochlorothiazide  the morning of the test  Diabetic Preparation: If able to eat breakfast prior to 3 hour fasting, you may take all medications, including your insulin . Do not worry if you miss your breakfast dose of insulin  - start at your next meal. If you do not eat prior to 3 hour fast-Hold all diabetes (oral and insulin ) medications. Patients who wear a continuous glucose monitor MUST remove the device prior to scanning.  You may take your remaining medications with water.  NO perfume, cologne or lotion on chest or abdomen area. FEMALES - Please avoid wearing dresses to this appointment.  Total time is 1 to 2 hours; you may want to bring reading material for the waiting time.  IF YOU THINK YOU MAY BE PREGNANT, OR ARE NURSING PLEASE INFORM THE TECHNOLOGIST.  In preparation for your appointment, medication and supplies will be purchased.  Appointment availability is limited, so if you need to cancel or reschedule, please call the Radiology Department Scheduler at [phone] 24 hours in advance to avoid a cancellation fee of $100.00  What to Expect When you Arrive:  Once you arrive and check in for your appointment, you will be taken to a preparation room within the Radiology Department.  A technologist or Nurse will obtain your medical history, verify that you are correctly prepped for the exam, and explain the procedure.  Afterwards, an IV will be started in your arm and electrodes will be placed on your skin for EKG monitoring during the stress portion of the exam. Then you will be escorted to the PET/CT scanner.  There, staff will get you positioned on the scanner and obtain a blood pressure and EKG.  During the exam, you will continue to be connected to the EKG and blood pressure machines.  A small, safe amount of a radioactive tracer will be injected in your IV to obtain a series of pictures of your heart along with an injection of a stress agent.    After your  Exam:  It is recommended that you eat a meal and drink a caffeinated beverage to counter act any effects of the stress agent.  Drink plenty of fluids for the remainder of the day and urinate frequently for the first couple of hours after the exam.  Your doctor will inform you of your test results within 7-10 business days.  For more information and frequently asked questions, please visit our website: [email or web address]  For questions about your test or how to prepare for your test, please call: Cardiac Imaging Nurse Navigators Office: [phone]

## 2024-02-14 NOTE — Addendum Note (Signed)
 Addended by: Lott Rouleau A on: 02/14/2024 10:58 AM   Modules accepted: Orders

## 2024-02-14 NOTE — Progress Notes (Signed)
 Remote pacemaker transmission.

## 2024-03-03 ENCOUNTER — Other Ambulatory Visit: Payer: Self-pay | Admitting: Cardiology

## 2024-03-03 DIAGNOSIS — I25119 Atherosclerotic heart disease of native coronary artery with unspecified angina pectoris: Secondary | ICD-10-CM

## 2024-03-03 DIAGNOSIS — I10 Essential (primary) hypertension: Secondary | ICD-10-CM

## 2024-03-04 ENCOUNTER — Other Ambulatory Visit: Payer: Self-pay | Admitting: Cardiovascular Disease

## 2024-03-05 NOTE — Telephone Encounter (Signed)
Please advise on how to proceed.

## 2024-03-06 NOTE — Telephone Encounter (Signed)
 Pt seen by Arida 02/09/24: 2. 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 This affects his ability to work. I requested cardiac PET scan. Most recent cardiac catheterization in 2023 showed significant restenosis and SVG to OM treated with balloon angioplasty. Suspect restenosis. If there is significant ischemia on stress test, he might benefit from repeat cath and consideration for drug-coated balloon angioplasty.   Pt has current follow-up scheduled with Arlester Ladd in June. Sent 90 day supply in at this time-switched to 60mg  tablets as requested so pt would not have to cut.

## 2024-03-12 ENCOUNTER — Ambulatory Visit (HOSPITAL_COMMUNITY)
Admission: RE | Admit: 2024-03-12 | Discharge: 2024-03-12 | Disposition: A | Discharge status: Home / Self Care | Source: Ambulatory Visit | Attending: Cardiovascular Disease | Admitting: Cardiovascular Disease

## 2024-03-12 ENCOUNTER — Ambulatory Visit (HOSPITAL_COMMUNITY)
Admission: RE | Admit: 2024-03-12 | Discharge: 2024-03-12 | Discharge status: Home / Self Care | Source: Ambulatory Visit | Attending: Cardiovascular Disease | Admitting: Cardiovascular Disease

## 2024-03-12 DIAGNOSIS — I739 Peripheral vascular disease, unspecified: Secondary | ICD-10-CM

## 2024-03-12 LAB — VAS US ABI WITH/WO TBI
Left ABI: 1.05
Right ABI: 1.06

## 2024-03-13 ENCOUNTER — Ambulatory Visit: Payer: Self-pay | Admitting: Cardiovascular Disease

## 2024-03-13 DIAGNOSIS — I739 Peripheral vascular disease, unspecified: Secondary | ICD-10-CM

## 2024-03-25 ENCOUNTER — Encounter: Payer: Self-pay | Admitting: Cardiovascular Disease

## 2024-03-25 ENCOUNTER — Ambulatory Visit: Payer: Medicare Other | Attending: Internal Medicine | Admitting: Cardiovascular Disease

## 2024-03-25 VITALS — BP 120/70 | HR 64 | Ht 69.0 in | Wt 210.0 lb

## 2024-03-25 DIAGNOSIS — I25810 Atherosclerosis of coronary artery bypass graft(s) without angina pectoris: Secondary | ICD-10-CM | POA: Insufficient documentation

## 2024-03-25 DIAGNOSIS — I10 Essential (primary) hypertension: Secondary | ICD-10-CM | POA: Diagnosis not present

## 2024-03-25 DIAGNOSIS — Z79899 Other long term (current) drug therapy: Secondary | ICD-10-CM | POA: Insufficient documentation

## 2024-03-25 DIAGNOSIS — E782 Mixed hyperlipidemia: Secondary | ICD-10-CM | POA: Insufficient documentation

## 2024-03-25 DIAGNOSIS — I70213 Atherosclerosis of native arteries of extremities with intermittent claudication, bilateral legs: Secondary | ICD-10-CM | POA: Diagnosis not present

## 2024-03-25 MED ORDER — EZETIMIBE 10 MG PO TABS: 10.0000 mg | ORAL_TABLET | Freq: Every day | ORAL | 3 refills | Status: AC

## 2024-03-25 NOTE — Patient Instructions (Signed)
 Medication Instructions:  START Zetia /Ezetimeibe 10mg  daily *If you need a refill on your cardiac medications before your next appointment, please call your pharmacy*  Lab Work: Lipids, LFT's in 3-4 months If you have labs (blood work) drawn today and your tests are completely normal, you will receive your results only by: MyChart Message (if you have MyChart) OR A paper copy in the mail If you have any lab test that is abnormal or we need to change your treatment, we will call you to review the results.  Follow-Up: At [facility], you and your health needs are our priority.  As part of our continuing mission to provide you with exceptional heart care, our providers are all part of one team.  This team includes your primary Cardiologist (physician) and Advanced Practice Providers or APPs (Physician Assistants and Nurse Practitioners) who all work together to provide you with the care you need, when you need it.  Your next appointment:   6 month(s)  Provider:   Arnoldo Lapping, MD or One of our Advanced Practice Providers (APPs): Melita Springer, PA-C  Friddie Jetty, NP Evaline Hill, NP  Theotis Flake, PA-C Lawana Pray, NP  Willis Harter, PA-C Lovette Rud, PA-C  [place], [place] Charles Connor, NP  Marlana Silvan, NP Marcie Sever, PA-C  Laquita Plant, PA-C    Dayna Dunn, PA-C  Marlyse Single, PA-C Palmer Bobo, NP Katlyn West, NP Callie Goodrich, PA-C  Evan Williams, PA-C Sheng Haley, PA-C  Xika Zhao, NP Kathleen Johnson, PA-C

## 2024-03-25 NOTE — Progress Notes (Signed)
 Cardiology Office Note:    Date:  03/25/2024   ID:  [name], [name] 1946/06/26, MRN [id number]  PCP:  Darnelle Elders, PA-C   [facility] Providers Cardiologist:  Arnoldo Lapping, MD Electrophysiologist:  Will Cortland Ding, MD     Referring MD: Darnelle Elders, PA-C   Chief Complaint  Patient presents with   Coronary Artery Disease    History of Present Illness:    [name] [name] is a 78 y.o. male with a hx of coronary artery disease status post CABG, peripheral arterial disease status post femoral endarterectomy, and severe aortic stenosis status post TAVR.  The patient also has a history of complete heart block and underwent permanent pacemaker implantation following TAVR in 2023.  Comorbid conditions include diabetes, hypertension, and hyperlipidemia.  The patient is here alone today.  He continues to have exertional angina with no significant change over time.  He is had the symptoms for several years.  He reports that he really did not get any improvement in his anginal threshold with TAVR.  He also did not have any significant change with his last PCI procedure which was performed in 2023.  Some days he is able to do quite a bit without any symptoms and other days he is more limited.  He describes pressure in the chest that sometimes is localized to the mid chest and other times spreads out across the front of the chest.  There is no radiation into the shoulders or jaw.  There is no shortness of breath, orthopnea, or PND.  No heart palpitations, lightheadedness, or syncope.  The patient takes occasional nitroglycerin  with prompt relief of his symptoms.  Current Medications: Current Meds  Medication Sig   amLODipine  (NORVASC ) 10 MG tablet TAKE 1 TABLET BY MOUTH EVERY DAY   amoxicillin  (AMOXIL ) 500 MG tablet Take 4 tablets by mouth 1 hour prior to dental procedures and cleanings.   aspirin  81 MG EC tablet TAKE 1 TABLET BY MOUTH EVERY DAY   benazepril  (LOTENSIN ) 40 MG  tablet Take 40 mg by mouth daily.    clopidogrel  (PLAVIX ) 75 MG tablet TAKE 1 TABLET BY MOUTH EVERY DAY   ezetimibe  (ZETIA ) 10 MG tablet Take 1 tablet (10 mg total) by mouth daily.   finasteride  (PROSCAR ) 5 MG tablet Take 5 mg by mouth daily.   glucose blood (PRECISION QID TEST) test strip 1 each by Other route as directed.   hydrochlorothiazide  (HYDRODIURIL ) 25 MG tablet TAKE 1 TABLET (25 MG TOTAL) BY MOUTH DAILY.   Insulin  Pen Needle (B-D UF III MINI PEN NEEDLES) 31G X 5 MM MISC Use to inject Ozempic  once a week   isosorbide  mononitrate (IMDUR ) 60 MG 24 hr tablet Take 1 tablet (60 mg total) by mouth 2 (two) times daily.   metFORMIN  (GLUCOPHAGE ) 1000 MG tablet Take 1,000 mg by mouth 2 (two) times daily with a meal.   metoprolol  tartrate (LOPRESSOR ) 50 MG tablet TAKE 1 TABLET BY MOUTH TWICE A DAY   Multiple Vitamin (MULTIVITAMIN WITH MINERALS) TABS tablet Take 1 tablet by mouth daily.   nitroGLYCERIN  (NITROSTAT ) 0.4 MG SL tablet DISSOLVE 1 TABLET UNDER THE TONGUE EVERY 5 MINUTES AS NEEDED FOR CHEST PAIN. MAX OF 3 DOSES, THEN 911.   NOVOLIN N FLEXPEN 100 UNIT/ML FlexPen Inject 15 Units into the skin 2 (two) times daily.   omeprazole (PRILOSEC) 20 MG capsule Take 20 mg by mouth every morning.   ranolazine  (RANEXA ) 500 MG 12 hr tablet TAKE 1 TABLET BY  MOUTH TWICE A DAY   rosuvastatin  (CRESTOR ) 40 MG tablet Take 1 tablet (40 mg total) by mouth daily. (Patient taking differently: Take 20 mg by mouth daily.)     Allergies:   Empagliflozin   ROS:   Please see the history of present illness.    All other systems reviewed and are negative.  EKGs/Labs/Other Studies Reviewed:    The following studies were reviewed today: Cardiac Studies & Procedures   ______________________________________________________________________________________________ CARDIAC CATHETERIZATION  CARDIAC CATHETERIZATION 04/01/2022  Conclusion   Ost LAD to Prox LAD lesion is 100% stenosed.  LIMA to LAD is patent.   Dist  LAD lesion is 70% stenosed.  Diffuse disease noted in the native LAD.   Prox RCA to Mid RCA lesion is 100% stenosed.  SVG to PDA is patent.   Dist RCA lesion is 40% stenosed.   Ost RPDA to RPDA lesion is 40% stenosed.   Ost Cx to Prox Cx lesion is 99% stenosed.   SVG to OM is patent.  There is a Dist Graft to Insertion lesion is 80% stenosed.   Scoring balloon angioplasty was performed using a 1.75 mm score flex, followed by a BALL SAPPHIRE NC24 2.50X10.   Post intervention, there is a 10% residual stenosis.   There is moderate aortic valve stenosis.  Mean gradient 37 mmHg  Continue aggressive secondary prevention.  Dual antiplatelet therapy going forward.  Area of disease was distal in the graft and there is difficulty in delivering equipment as well.  I do not think an additional stent would be beneficial.  Could consider drug-eluting balloon if available in the event that he has further restenosis.  Findings Coronary Findings Diagnostic  Dominance: Right  Left Anterior Descending Vessel is large. Ost LAD to Prox LAD lesion is 100% stenosed. The lesion is chronically occluded. Dist LAD lesion is 70% stenosed.  Left Circumflex Ost Cx to Prox Cx lesion is 99% stenosed.  Second Obtuse Marginal Branch Vessel is moderate in size.  Right Coronary Artery Prox RCA to Mid RCA lesion is 100% stenosed. Dist RCA lesion is 40% stenosed.  Right Posterior Descending Artery Ost RPDA to RPDA lesion is 40% stenosed. The lesion is moderately calcified. The lesion was previously treated .  Saphenous Graft To Dist RCA SVG and is normal in caliber.  Saphenous Graft To 2nd Mrg SVG and is normal in caliber. Dist Graft to Insertion lesion is 80% stenosed. The lesion was previously treated using a drug eluting stent over 2 years ago.  Saphenous Graft To 1st Diag SVG and is normal in caliber.  LIMA LIMA Graft To Dist LAD LIMA and is normal in caliber.  Intervention  Dist Graft to Insertion  lesion (Saphenous Graft To 2nd Mrg) Angioplasty CATHETER LAUNCHER 6FR AL1 guide catheter was inserted. WIRE ASAHI PROWATER 180CM guidewire used to cross lesion. Scoring balloon angioplasty was performed using a BALL SAPPHIRE NC24 2.50X10. Initially, a 2.0 score flex balloon was advanced but would not cross.  We then used a 2.0 semicompliant balloon to predilate.  The 2.0 score flex still would not cross.  We then placed a second wire for support which was the grand slam wire.  We then used a 1.75 score flex balloon to pretreat.  We then used a 2.5 [place] balloon after multiple inflations with a score flex had been done.  There was significantly improved angiographic result. Areas to distal and the graft and there is difficulty in delivering equipment as well.  I do not think  an additional stent would be beneficial.  Could consider drug-eluting balloon if available in the event that he has further restenosis. Post-Intervention Lesion Assessment The intervention was successful. Pre-interventional TIMI flow is 3. Post-intervention TIMI flow is 3. No complications occurred at this lesion. There is a 10% residual stenosis post intervention.   CARDIAC CATHETERIZATION  CARDIAC CATHETERIZATION 09/10/2021  Conclusion   Ost LAD to Prox LAD lesion is 100% stenosed.   Dist LAD lesion is 70% stenosed.   Ost Cx to Prox Cx lesion is 99% stenosed.   Dist RCA lesion is 40% stenosed.   Prox RCA to Mid RCA lesion is 100% stenosed.   Ost RPDA to RPDA lesion is 40% stenosed.   Dist Graft to Insertion lesion is 75% stenosed.   Balloon angioplasty was performed using a BALLN [place] EUPHORA RX 2.5X15.   Post intervention, there is a 20% residual stenosis.   SVG and is normal in caliber.   SVG and is normal in caliber.   SVG and is normal in caliber.   LIMA and is normal in caliber.   There is moderate aortic valve stenosis.   There is no mitral valve stenosis.  1.  Severe native three-vessel coronary artery disease  with total occlusion of the RCA and total occlusion of the left main. 2.  Status post aortocoronary bypass surgery with continued patency of the LIMA to LAD, saphenous vein graft to distal RCA, saphenous vein graft to diagonal, and saphenous vein graft to OM.  There is mild in-stent restenosis in the native PDA ostium estimated at 40%.  There is severe in-stent restenosis at the stented segment of the SVG to OM anastomosis, estimated at 75%. 3.  Successful balloon angioplasty of severe in-stent restenosis involving the SVG to obtuse marginal distal anastomosis, reducing 75% stenosis to 20%. 4.  Moderate aortic stenosis with mean gradient 33 mmHg  Recommend: Same-day PCI protocol as long as no early complications arise.  Ongoing medical therapy and surveillance of aortic stenosis.  If persistent lifestyle limiting symptoms, will need to consider redo CABG/aortic valve replacement versus TAVR and ongoing medical therapy.  If significant improvement in symptoms, we will continue with our current management of medical therapy and surveillance of aortic stenosis.  Findings Coronary Findings Diagnostic  Dominance: Right  Left Anterior Descending Vessel is large. Ost LAD to Prox LAD lesion is 100% stenosed. The lesion is chronically occluded. Dist LAD lesion is 70% stenosed.  Left Circumflex Ost Cx to Prox Cx lesion is 99% stenosed.  Second Obtuse Marginal Branch Vessel is moderate in size.  Right Coronary Artery Prox RCA to Mid RCA lesion is 100% stenosed. Dist RCA lesion is 40% stenosed.  Right Posterior Descending Artery Ost RPDA to RPDA lesion is 40% stenosed. The lesion is moderately calcified. The lesion was previously treated .  Saphenous Graft To Dist RCA SVG and is normal in caliber.  Saphenous Graft To 2nd Mrg SVG and is normal in caliber. Dist Graft to Insertion lesion is 75% stenosed. The lesion was previously treated using a drug eluting stent over 2 years ago.  Saphenous  Graft To 1st Diag SVG and is normal in caliber.  LIMA LIMA Graft To Dist LAD LIMA and is normal in caliber.  Intervention  Dist Graft to Insertion lesion (Saphenous Graft To 2nd Mrg) Angioplasty CATHETER LAUNCHER 6FR AL1 guide catheter was inserted. WIRE RUNTHROUGH .[id number] guidewire used to cross lesion. Balloon angioplasty was performed using a BALLN [place] EUPHORA RX 2.5X15. Maximum pressure: 20  atm. There is a large size mismatch between the saphenous vein graft and native vessel.  The saphenous vein graft is at least 4 mm in diameter and the native vessel was 2.25 mm in diameter.  There is diffuse in-stent restenosis within the stented segment which extends out into the vein graft.  The lesion is treated with a 2.5 mm noncompliant balloon to high pressure.  I put in a shorter 2.5 mm noncompliant balloon so that the more distal portion of the stent could be treated.  This segment was dilated to 18 atm.  The patient tolerated the procedure well and there were no immediate complications.  There is TIMI-3 flow pre and post PCI. Post-Intervention Lesion Assessment The intervention was successful. Pre-interventional TIMI flow is 3. Post-intervention TIMI flow is 3. No complications occurred at this lesion. There is a 20% residual stenosis post intervention.     ECHOCARDIOGRAM  ECHOCARDIOGRAM COMPLETE 09/20/2023  Narrative ECHOCARDIOGRAM REPORT    Patient Name:   CUTTER PASSEY Date of Exam: 09/20/2023 Medical Rec #:  [id number]     Height:       69.0 in Accession #:    [id number]    Weight:       208.0 lb Date of Birth:  Dec 17, 1945     BSA:          2.101 m Patient Age:    77 years      BP:           120/68 mmHg Patient Gender: M             HR:           60 bpm. Exam Location:  [address]  Procedure: 2D Echo, Color Doppler, Cardiac Doppler and 3D Echo  Indications:    S/P Aortic Valve replacement Z95.2  History:        Patient has prior history of Echocardiogram examinations,  most recent 11/09/2022. Prior CABG, PAD; Risk Factors:Hypertension and Diabetes. Aortic Valve: 26 mm Edwards Sapien prosthetic, stented (TAVR) valve is present in the aortic position. Procedure Date: 10/04/2022.  Sonographer:    Joleen Navy RDCS Referring Phys: [phone] JILL D MCDANIEL  IMPRESSIONS   1. Left ventricular ejection fraction, by estimation, is 55 to 60%. The left ventricle has normal function. The left ventricle has no regional wall motion abnormalities. There is mild left ventricular hypertrophy. Left ventricular diastolic parameters are consistent with Grade I diastolic dysfunction (impaired relaxation). 2. Right ventricular systolic function is normal. The right ventricular size is normal. Tricuspid regurgitation signal is inadequate for assessing PA pressure. 3. Trivial mitral valve regurgitation. 4. S/p 26 mm Edwards Sapien TAVR. V max 1.65 m/s. No PVL. mean gradient 6 mmHg.Aaron Aas Aortic valve regurgitation is not visualized. There is a 26 mm Edwards Sapien prosthetic (TAVR) valve present in the aortic position. Procedure Date: 10/04/2022. Echo findings are consistent with normal structure and function of the aortic valve prosthesis. 5. The inferior vena cava is normal in size with greater than 50% respiratory variability, suggesting right atrial pressure of 3 mmHg.  Comparison(s): No significant change from prior study.  FINDINGS Left Ventricle: Left ventricular ejection fraction, by estimation, is 55 to 60%. The left ventricle has normal function. The left ventricle has no regional wall motion abnormalities. The left ventricular internal cavity size was normal in size. There is mild left ventricular hypertrophy. Abnormal (paradoxical) septal motion consistent with post-operative status. Left ventricular diastolic parameters are consistent with Grade I diastolic dysfunction (impaired relaxation).  Right Ventricle: The right ventricular size is normal. Right ventricular  systolic function is normal. Tricuspid regurgitation signal is inadequate for assessing PA pressure.  Left Atrium: Left atrial size was normal in size.  Right Atrium: Right atrial size was normal in size.  Pericardium: Trivial pericardial effusion is present.  Mitral Valve: There is mild thickening of the mitral valve leaflet(s). There is mild calcification of the mitral valve leaflet(s). Trivial mitral valve regurgitation.  Tricuspid Valve: Tricuspid valve regurgitation is not demonstrated.  Aortic Valve: S/p 26 mm Edwards Sapien TAVR. V max 1.65 m/s. No PVL. mean gradient 6 mmHg. Aortic valve regurgitation is not visualized. Aortic valve mean gradient measures 6.0 mmHg. Aortic valve peak gradient measures 11.0 mmHg. Aortic valve area, by VTI measures 1.95 cm. There is a 26 mm Edwards Sapien prosthetic, stented (TAVR) valve present in the aortic position. Procedure Date: 10/04/2022. Echo findings are consistent with normal structure and function of the aortic valve prosthesis.  Pulmonic Valve: Pulmonic valve regurgitation is not visualized.  Aorta: The aortic root and ascending aorta are structurally normal, with no evidence of dilitation.  Venous: The inferior vena cava is normal in size with greater than 50% respiratory variability, suggesting right atrial pressure of 3 mmHg.  IAS/Shunts: No atrial level shunt detected by color flow Doppler.   LEFT VENTRICLE PLAX 2D LVIDd:         5.00 cm   Diastology LVIDs:         3.50 cm   LV e' medial:    5.77 cm/s LV PW:         1.00 cm   LV E/e' medial:  14.9 LV IVS:        1.00 cm   LV e' lateral:   7.62 cm/s LVOT diam:     2.10 cm   LV E/e' lateral: 11.3 LV SV:         69 LV SV Index:   33 LVOT Area:     3.46 cm  3D Volume EF: 3D EF:        60 % LV EDV:       138 ml LV ESV:       55 ml LV SV:        83 ml  RIGHT VENTRICLE RV Basal diam:  3.80 cm RV Mid diam:    3.70 cm RV S prime:     14.00 cm/s TAPSE (M-mode): 2.9 cm  LEFT  ATRIUM           Index        RIGHT ATRIUM           Index LA diam:      5.10 cm 2.43 cm/m   RA Area:     11.50 cm LA Vol (A2C): 27.9 ml 13.28 ml/m  RA Volume:   25.40 ml  12.09 ml/m LA Vol (A4C): 38.2 ml 18.18 ml/m AORTIC VALVE AV Area (Vmax):    1.93 cm AV Area (Vmean):   1.76 cm AV Area (VTI):     1.95 cm AV Vmax:           165.50 cm/s AV Vmean:          113.500 cm/s AV VTI:            0.352 m AV Peak Grad:      11.0 mmHg AV Mean Grad:      6.0 mmHg LVOT Vmax:         92.20 cm/s LVOT  Vmean:        57.600 cm/s LVOT VTI:          0.198 m LVOT/AV VTI ratio: 0.56  AORTA Ao Root diam: 3.60 cm Ao Asc diam:  3.70 cm  MITRAL VALVE MV Area (PHT): 2.62 cm     SHUNTS MV Decel Time: 289 msec     Systemic VTI:  0.20 m MV E velocity: 85.90 cm/s   Systemic Diam: 2.10 cm MV A velocity: 109.00 cm/s MV E/A ratio:  0.79  [place] signed by Alois Arnt Signature Date/Time: 09/20/2023/10:16:45 AM    Final      CT SCANS  CT CORONARY MORPH W/CTA COR W/SCORE 08/26/2022  Addendum 08/27/2022  3:59 PM ADDENDUM REPORT: 08/27/2022 15:56  CLINICAL DATA:  17J with severe aortic stenosis being evaluated for a TAVR procedure.  EXAM: Cardiac TAVR CT  TECHNIQUE: The patient was scanned on a [employer]. A 120 kV retrospective scan was triggered in the descending thoracic aorta at 111 HU's. Gantry rotation speed was 250 msecs and collimation was .6 mm. No beta blockade or nitro were given. The 3D data set was reconstructed in 5% intervals of the R-R cycle. Systolic and diastolic phases were analyzed on a dedicated work station using MPR, MIP and VRT modes. The patient received 100 cc of contrast.  FINDINGS: Aortic Root:  Aortic valve: Trileaflet  Aortic valve calcium  score: 2370  Aortic annulus:  Diameter: 28mm x 23mm  Perimeter: 78mm  Area: 467 mm^2  Calcifications: No calcifications  Coronary height: Min Left - 12mm, Min Right -  17mm  Sinotubular height: Left cusp - 22mm; Right cusp - 24mm; Noncoronary cusp - 26mm  LVOT (as measured 3 mm below the annulus):  Diameter: 29mm x 22mm  Area: 487 mm^2  Calcifications: No calcifications  Aortic sinus width: Left cusp - 34mm; Right cusp - 32mm; Noncoronary cusp - 35mm  Sinotubular junction width: 29mm x 28mm  Optimum Fluoroscopic Angle for Delivery: LAO 11 CRA 10  Cardiac:  Right atrium: Normal size  Right ventricle: Normal size  Pulmonary arteries: Normal size  Pulmonary veins: Normal configuration  Left atrium: Mild enlargement  Left ventricle: Normal size  Pericardium: Normal thickness  Coronary arteries: S/p CABG with patent LIMA-LAD, SVG-diagonal, SVG-OM, and SVG-PDA  IMPRESSION: 1. Trileaflet aortic valve with severe calcifications (AV calcium  score 2370)  2. Aortic annulus measures 28mm x 23mm in diameter with perimeter 78mm and area 467 mm^2. No annular or LVOT calcifications. Annular measurements are suitable for delivery of a 26mm Edwards Sapien 3 valve  3. Sufficient coronary to annulus distance, measuring 12mm to left main and 17mm to RCA  4. Optimum Fluoroscopic Angle for Delivery:  LAO 11 CRA 10  5. S/p CABG with patent LIMA-LAD, SVG-diagonal, SVG-OM, and SVG-PDA   Electronically Signed By: Carson Clara M.D. On: 08/27/2022 15:56  Narrative EXAM: OVER-READ INTERPRETATION  CT CHEST  The following report is a limited chest CT over-read performed by radiologist Dr. Karlyn Overman of [facility] on 08/26/2022. This over-read does not include interpretation of cardiac or coronary anatomy or pathology. The cardiac CTA interpretation by the cardiologist is attached.  COMPARISON:  None Available.  FINDINGS: Please see the separate concurrent chest CT angiogram report for details.  IMPRESSION: Please see the separate concurrent chest CT angiogram report for details.  Electronically Signed: By:  Levell Reach M.D. On: 08/26/2022 11:10     ______________________________________________________________________________________________      EKG:  EKG Interpretation Date/Time:  Monday March 25 2024 10:40:58 EDT Ventricular Rate:  64 PR Interval:  196 QRS Duration:  166 QT Interval:  456 QTC Calculation: 470 R Axis:   110  Text Interpretation: Atrial-sensed ventricular-paced rhythm When compared with ECG of 24-Jan-2024 14:00, Electronic ventricular pacemaker has replaced Sinus rhythm Confirmed by Arnoldo Lapping [phone] on 03/25/2024 10:53:03 AM    Recent Labs: No results found for requested labs within last 365 days.  Recent Lipid Panel    Component Value Date/Time   CHOL 127 11/09/2022 0000   TRIG 163 (H) 11/09/2022 0000   HDL 35 (L) 11/09/2022 0000   CHOLHDL 3.6 11/09/2022 0000   CHOLHDL 2.5 01/14/2021 0105   VLDL 26 01/14/2021 0105   LDLCALC 64 11/09/2022 0000     Risk Assessment/Calculations:                Physical Exam:    VS:  BP 120/70   Pulse 64   Ht 5\' 9"  (1.753 m)   Wt 210 lb (95.3 kg)   SpO2 97%   BMI 31.01 kg/m     Wt Readings from Last 3 Encounters:  03/25/24 210 lb (95.3 kg)  02/13/24 208 lb 9.6 oz (94.6 kg)  01/24/24 207 lb (93.9 kg)     GEN:  Well nourished, well developed in no acute distress HEENT: Normal NECK: No JVD; No carotid bruits LYMPHATICS: No lymphadenopathy CARDIAC: RRR, 2/6 crescendo decrescendo murmur at the right upper sternal border RESPIRATORY:  Clear to auscultation without rales, wheezing or rhonchi  ABDOMEN: Soft, non-tender, non-distended MUSCULOSKELETAL:  No edema; No deformity  SKIN: Warm and dry NEUROLOGIC:  Alert and oriented x 3 PSYCHIATRIC:  Normal affect   Assessment & Plan Essential hypertension Blood pressure well-controlled.  Continue benazepril , hydrochlorothiazide , and isosorbide  Atherosclerosis of native artery of both lower extremities with intermittent claudication [facility]) Patient clinically  stable after right femoral endarterectomy.  Managed appropriately with medical treatment on DAPT and a high intensity statin drug.  Followed by Dr. Alvenia Aus. Coronary artery disease involving coronary bypass graft of native heart without angina pectoris Patient with extensive CAD.  With his stable anginal symptoms, I will plan on treating him medically.  He will continue on ranolazine , metoprolol , isosorbide , and amlodipine .  He is scheduled for a cardiac PET stress test.  I would consider repeat catheterization if his stress test result is high risk with either large area of ischemia or multi territory ischemia.  Otherwise, I would recommend treating him medically.  From a symptomatic perspective, he has not received much benefit from recent interventions. Medication management Add Zetia  10 mg, continue rosuvastatin  40 mg.  Last LDL cholesterol was 85 above goal.  Repeat lipids and LFTs in 3 to 4 months. Mixed hyperlipidemia As above, add Zetia .            Medication Adjustments/Labs and Tests Ordered: Current medicines are reviewed at length with the patient today.  Concerns regarding medicines are outlined above.  Orders Placed This Encounter  Procedures   Lipid panel   Hepatic function panel   EKG 12-Lead   Meds ordered this encounter  Medications   ezetimibe  (ZETIA ) 10 MG tablet    Sig: Take 1 tablet (10 mg total) by mouth daily.    Dispense:  90 tablet    Refill:  3    Patient Instructions  Medication Instructions:  START Zetia /Ezetimeibe 10mg  daily *If you need a refill on your cardiac medications before your next appointment, please call  your pharmacy*  Lab Work: Lipids, LFT's in 3-4 months If you have labs (blood work) drawn today and your tests are completely normal, you will receive your results only by: MyChart Message (if you have MyChart) OR A paper copy in the mail If you have any lab test that is abnormal or we need to change your treatment, we will call you to  review the results.  Follow-Up: At [facility], you and your health needs are our priority.  As part of our continuing mission to provide you with exceptional heart care, our providers are all part of one team.  This team includes your primary Cardiologist (physician) and Advanced Practice Providers or APPs (Physician Assistants and Nurse Practitioners) who all work together to provide you with the care you need, when you need it.  Your next appointment:   6 month(s)  Provider:   Arnoldo Lapping, MD or One of our Advanced Practice Providers (APPs): Melita Springer, PA-C  Friddie Jetty, NP Evaline Hill, NP  Theotis Flake, PA-C Lawana Pray, NP  Willis Harter, PA-C Lovette Rud, PA-C  [place], [place] Charles Connor, NP  Marlana Silvan, NP Marcie Sever, PA-C  Laquita Plant, PA-C    Dayna Dunn, PA-C  Marlyse Single, PA-C Palmer Bobo, NP Katlyn West, NP Callie Goodrich, PA-C  Evan Williams, PA-C Sheng Haley, PA-C  Xika Zhao, NP Liane Redman, PA-C      Signed, Arnoldo Lapping, MD  03/25/2024 1:32 PM    [facility] [facility]

## 2024-03-25 NOTE — Assessment & Plan Note (Signed)
 Patient with extensive CAD.  With his stable anginal symptoms, I will plan on treating him medically.  He will continue on ranolazine , metoprolol , isosorbide , and amlodipine .  He is scheduled for a cardiac PET stress test.  I would consider repeat catheterization if his stress test result is high risk with either large area of ischemia or multi territory ischemia.  Otherwise, I would recommend treating him medically.  From a symptomatic perspective, he has not received much benefit from recent interventions.

## 2024-03-25 NOTE — Assessment & Plan Note (Signed)
 As above, add Zetia .

## 2024-03-25 NOTE — Assessment & Plan Note (Signed)
 Blood pressure well-controlled.  Continue benazepril , hydrochlorothiazide , and isosorbide 

## 2024-03-25 NOTE — Assessment & Plan Note (Signed)
 Patient clinically stable after right femoral endarterectomy.  Managed appropriately with medical treatment on DAPT and a high intensity statin drug.  Followed by Dr. Alvenia Aus.

## 2024-03-26 ENCOUNTER — Other Ambulatory Visit: Payer: Self-pay | Admitting: Cardiovascular Disease

## 2024-03-26 DIAGNOSIS — I350 Nonrheumatic aortic (valve) stenosis: Secondary | ICD-10-CM

## 2024-04-04 ENCOUNTER — Ambulatory Visit (INDEPENDENT_AMBULATORY_CARE_PROVIDER_SITE_OTHER): Payer: Medicare Other

## 2024-04-04 DIAGNOSIS — I442 Atrioventricular block, complete: Secondary | ICD-10-CM

## 2024-04-04 LAB — CUP PACEART REMOTE DEVICE CHECK
Battery Remaining Longevity: 137 mo
Battery Voltage: 3.04 V
Brady Statistic AP VP Percent: 28.73 %
Brady Statistic AP VS Percent: 0.04 %
Brady Statistic AS VP Percent: 70.36 %
Brady Statistic AS VS Percent: 0.88 %
Brady Statistic RA Percent Paced: 29.08 %
Brady Statistic RV Percent Paced: 99.08 %
Date Time Interrogation Session: 20250619022357
Implantable Lead Connection Status: 753985
Implantable Lead Connection Status: 753985
Implantable Lead Implant Date: 20231220
Implantable Lead Implant Date: 20231220
Implantable Lead Location: 753859
Implantable Lead Location: 753860
Implantable Lead Model: 3830
Implantable Lead Model: 5076
Implantable Pulse Generator Implant Date: 20231220
Lead Channel Impedance Value: 304 Ohm
Lead Channel Impedance Value: 361 Ohm
Lead Channel Impedance Value: 361 Ohm
Lead Channel Impedance Value: 589 Ohm
Lead Channel Pacing Threshold Amplitude: 0.625 V
Lead Channel Pacing Threshold Amplitude: 0.875 V
Lead Channel Pacing Threshold Pulse Width: 0.4 ms
Lead Channel Pacing Threshold Pulse Width: 0.4 ms
Lead Channel Sensing Intrinsic Amplitude: 14 mV
Lead Channel Sensing Intrinsic Amplitude: 14 mV
Lead Channel Sensing Intrinsic Amplitude: 4 mV
Lead Channel Sensing Intrinsic Amplitude: 4 mV
Lead Channel Setting Pacing Amplitude: 1.5 V
Lead Channel Setting Pacing Amplitude: 2 V
Lead Channel Setting Pacing Pulse Width: 0.4 ms
Lead Channel Setting Sensing Sensitivity: 1.2 mV
Zone Setting Status: 755011
Zone Setting Status: 755011

## 2024-04-05 ENCOUNTER — Ambulatory Visit: Payer: Self-pay | Admitting: Cardiology

## 2024-05-21 ENCOUNTER — Encounter (HOSPITAL_COMMUNITY): Admission: RE | Admit: 2024-05-21 | Source: Ambulatory Visit

## 2024-06-03 ENCOUNTER — Other Ambulatory Visit: Payer: Self-pay | Admitting: Cardiology

## 2024-06-03 DIAGNOSIS — I25119 Atherosclerotic heart disease of native coronary artery with unspecified angina pectoris: Secondary | ICD-10-CM

## 2024-06-03 DIAGNOSIS — I10 Essential (primary) hypertension: Secondary | ICD-10-CM

## 2024-06-06 NOTE — Progress Notes (Signed)
 Remote PPM Transmission

## 2024-06-07 ENCOUNTER — Encounter (HOSPITAL_COMMUNITY): Payer: Self-pay

## 2024-06-11 ENCOUNTER — Encounter (HOSPITAL_COMMUNITY)
Admission: RE | Admit: 2024-06-11 | Discharge: 2024-06-11 | Disposition: A | Discharge status: Home / Self Care | Source: Ambulatory Visit | Attending: Cardiovascular Disease | Admitting: Cardiovascular Disease

## 2024-06-11 DIAGNOSIS — R079 Chest pain, unspecified: Secondary | ICD-10-CM | POA: Insufficient documentation

## 2024-06-11 MED ORDER — RUBIDIUM RB82 GENERATOR (RUBYFILL)
24.9300 | PACK | Freq: Once | INTRAVENOUS | Status: AC
  Administered 2024-06-11: 24.93 via INTRAVENOUS

## 2024-06-11 MED ORDER — RUBIDIUM RB82 GENERATOR (RUBYFILL)
24.4700 | PACK | Freq: Once | INTRAVENOUS | Status: AC
  Administered 2024-06-11: 24.47 via INTRAVENOUS

## 2024-06-11 MED ORDER — REGADENOSON 0.4 MG/5ML IV SOLN
INTRAVENOUS | Status: AC
  Filled 2024-06-11: qty 5

## 2024-06-11 MED ORDER — REGADENOSON 0.4 MG/5ML IV SOLN
0.4000 mg | Freq: Once | INTRAVENOUS | Status: AC
  Administered 2024-06-11: 0.4 mg via INTRAVENOUS

## 2024-06-12 LAB — NM PET CT CARDIAC PERFUSION MULTI W/ABSOLUTE BLOODFLOW
LV dias vol: 127 mL (ref 62–150)
LV sys vol: 78 mL (ref 4.2–5.8)
MBFR: 1.7
Nuc Rest EF: 47 %
Nuc Stress EF: 39 %
Rest MBF: 0.94 ml/g/min
Rest Nuclear Isotope Dose: 24.5 mCi
ST Depression (mm): 0 mm
Stress MBF: 1.6 ml/g/min
Stress Nuclear Isotope Dose: 25 mCi
TID: 1.31

## 2024-06-13 NOTE — Progress Notes (Signed)
 Stress test reviewed with multi territory ischemia on the PET scan.  Discussed with patient.  He continues to have stable exertional angina.  Considering the multiple areas of ischemia and high risk stress test abnormalities, favor cardiac catheterization for definitive evaluation along with possible PCI.  I talked to the patient over the telephone.  Advised that he be set up for an APP visit, preprocedure labs, and then we will schedule him for cardiac catheterization.  He has had the procedure multiple times in the past and understands the risks, benefits, and alternatives.  He will continue his current medical regimen at present.

## 2024-06-24 NOTE — H&P (View-Only) (Signed)
 Cardiology Office Note:    Date:  06/26/2024   ID:  [name] [name], [name] [name] January 23, 1946, MRN [id number]  PCP:  Katina Pfeiffer, PA-C   [facility] Providers Cardiologist:  [name], MD Cardiology APP:  Madie Jon Garre, [place]  Electrophysiologist:  Soyla Gladis Norton, MD     Referring MD: Katina Pfeiffer, PA-C   Chief Complaint  Patient presents with   Follow-up    Abnormal PET stress    History of Present Illness:    [name] [name] is a 78 y.o. 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   He underwent right common femoral endarterectomy in 2022. Heart catheterization 03/2022 with native disease, patent LIMA-LAD, SVG-D1, 80% stenosis in the distal SVG-marginal treated with scoring balloon angioplasty, and patent SVG-dRCA. He underwent TAVR 09/2022 with subsequent CHB and PPM placement.   He underwent PET stress test 06/11/2024 that showed concern for inferolateral ischemia, large reversible perfusion defect.   He presents today to discuss heart catheterization. He continues to have exertional CP and DOE. He has NTG SL. He is already on imdur , amlodipine , and ranexa , continue BB.   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 Fell & Dr. Lucas   Severe aortic stenosis     Past Surgical History:  Procedure Laterality Date   ABDOMINAL AORTOGRAM W/LOWER  EXTREMITY N/A 12/30/2020   Procedure: ABDOMINAL AORTOGRAM W/LOWER EXTREMITY;  Surgeon: Darron Deatrice LABOR, MD;  Location: [facility] INVASIVE CV LAB;  Service: Cardiovascular;  Laterality: N/A;   ADENOIDECTOMY     CARDIAC CATHETERIZATION N/A 07/18/2016   Procedure: Left Heart Cath and Cors/Grafts Angiography;  Surgeon: Lonni JONETTA Cash, MD;  Location: [facility] INVASIVE CV LAB;  Service: Cardiovascular;  Laterality: N/A;   CARDIAC CATHETERIZATION N/A 07/18/2016   Procedure: Coronary Stent Intervention;  Surgeon: Lonni JONETTA Cash, MD;  Location: [facility] INVASIVE CV LAB;  Service: Cardiovascular;  Laterality: N/A;   CHOLECYSTECTOMY     CORONARY ANGIOPLASTY WITH STENT PLACEMENT     CORONARY ARTERY BYPASS GRAFT     CORONARY BALLOON ANGIOPLASTY N/A 04/01/2022   Procedure: CORONARY BALLOON ANGIOPLASTY;  Surgeon: Dann Candyce RAMAN, MD;  Location: [facility] INVASIVE CV LAB;  Service: Cardiovascular;  Laterality: N/A;  SVG-OM1   CORONARY BALLOON ANGIOPLASTY N/A 09/10/2021   Procedure: CORONARY BALLOON ANGIOPLASTY;  Surgeon: Fell Ozell, MD;  Location: [facility] INVASIVE CV LAB;  Service: Cardiovascular;  Laterality: N/A;   CORONARY STENT INTERVENTION N/A 01/11/2019   Procedure: CORONARY STENT INTERVENTION;  Surgeon: Fell Ozell, MD;  Location: [facility] INVASIVE CV LAB;  Service: Cardiovascular;  Laterality: N/A;   ENDARTERECTOMY FEMORAL Right 01/13/2021   Procedure: RIGHT FEMORAL ENDARTERECTOMY  WITH PATCH ANGIOPLASTY;  Surgeon: Oris Krystal FALCON, MD;  Location: [facility] OR;  Service: Vascular;  Laterality: Right;   INGUINAL HERNIA REPAIR Right  INTRAOPERATIVE TRANSTHORACIC ECHOCARDIOGRAM N/A 10/04/2022   Procedure: INTRAOPERATIVE TRANSTHORACIC ECHOCARDIOGRAM;  Surgeon: Wonda Sharper, MD;  Location: [facility] OR;  Service: Open Heart Surgery;  Laterality: N/A;   LEFT HEART CATH AND CORS/GRAFTS ANGIOGRAPHY N/A 01/11/2019   Procedure: LEFT HEART CATH AND CORS/GRAFTS ANGIOGRAPHY;  Surgeon: Wonda Sharper, MD;  Location: [facility] INVASIVE CV LAB;   Service: Cardiovascular;  Laterality: N/A;   LEFT HEART CATH AND CORS/GRAFTS ANGIOGRAPHY N/A 04/01/2022   Procedure: LEFT HEART CATH AND CORS/GRAFTS ANGIOGRAPHY;  Surgeon: Dann Candyce RAMAN, MD;  Location: [facility] INVASIVE CV LAB;  Service: Cardiovascular;  Laterality: N/A;   LEFT HEART CATH AND CORS/GRAFTS ANGIOGRAPHY N/A 09/10/2021   Procedure: LEFT HEART CATH AND CORS/GRAFTS ANGIOGRAPHY;  Surgeon: Wonda Sharper, MD;  Location: [facility] INVASIVE CV LAB;  Service: Cardiovascular;  Laterality: N/A;   PACEMAKER IMPLANT N/A 10/05/2022   Procedure: PACEMAKER IMPLANT;  Surgeon: Inocencio Soyla Lunger, MD;  Location: [facility] INVASIVE CV LAB;  Service: Cardiovascular;  Laterality: N/A;   Right femoral endarterectomy and Dacron patch angioplasty  01/13/3021   TONSILLECTOMY     TRANSCATHETER AORTIC VALVE REPLACEMENT, TRANSFEMORAL Left 10/04/2022   Procedure: Transcatheter Aortic Valve Replacement, Transfemoral;  Surgeon: Wonda Sharper, MD;  Location: [facility] OR;  Service: Open Heart Surgery;  Laterality: Left;    Current Medications: Current Meds  Medication Sig   amLODipine  (NORVASC ) 10 MG tablet TAKE 1 TABLET BY MOUTH EVERY DAY   amoxicillin  (AMOXIL ) 500 MG tablet Take 4 tablets by mouth 1 hour prior to dental procedures and cleanings.   aspirin  81 MG EC tablet TAKE 1 TABLET BY MOUTH EVERY DAY   benazepril  (LOTENSIN ) 40 MG tablet Take 40 mg by mouth daily.    clopidogrel  (PLAVIX ) 75 MG tablet TAKE 1 TABLET BY MOUTH EVERY DAY   ezetimibe  (ZETIA ) 10 MG tablet Take 1 tablet (10 mg total) by mouth daily.   finasteride  (PROSCAR ) 5 MG tablet Take 5 mg by mouth daily.   glucose blood (PRECISION QID TEST) test strip 1 each by Other route as directed.   hydrochlorothiazide  (HYDRODIURIL ) 25 MG tablet TAKE 1 TABLET (25 MG TOTAL) BY MOUTH DAILY.   Insulin  Pen Needle (B-D UF III MINI PEN NEEDLES) 31G X 5 MM MISC Use to inject Ozempic  once a week   isosorbide  mononitrate (IMDUR ) 60 MG 24 hr tablet Take 1 tablet (60 mg total)  by mouth 2 (two) times daily.   metFORMIN  (GLUCOPHAGE ) 1000 MG tablet Take 1,000 mg by mouth 2 (two) times daily with a meal.   metoprolol  tartrate (LOPRESSOR ) 50 MG tablet TAKE 1 TABLET BY MOUTH TWICE A DAY   Multiple Vitamin (MULTIVITAMIN WITH MINERALS) TABS tablet Take 1 tablet by mouth daily.   nitroGLYCERIN  (NITROSTAT ) 0.4 MG SL tablet DISSOLVE 1 TAB UNDER THE TONGUE EVERY 5 MINUTES AS NEEDED FOR CHEST PAIN. MAX OF 3 DOSES, THEN 911.   NOVOLIN N FLEXPEN 100 UNIT/ML FlexPen Inject 15 Units into the skin 2 (two) times daily.   omeprazole (PRILOSEC) 20 MG capsule Take 20 mg by mouth every morning.   ranolazine  (RANEXA ) 500 MG 12 hr tablet TAKE 1 TABLET BY MOUTH TWICE A DAY   rosuvastatin  (CRESTOR ) 40 MG tablet Take 1 tablet (40 mg total) by mouth daily. (Patient taking differently: Take 20 mg by mouth daily.)     Allergies:   Empagliflozin   Social History   Socioeconomic History   Marital status: Married    Spouse name: Not on file   Number of children: Not on file  Years of education: Not on file   Highest education level: Not on file  Occupational History   Not on file  Tobacco Use   Smoking status: Never   Smokeless tobacco: Never  Vaping Use   Vaping status: Never Used  Substance and Sexual Activity   Alcohol use: No   Drug use: No   Sexual activity: Not on file  Other Topics Concern   Not on file  Social History Narrative   Not on file   Social Drivers of Health   Financial Resource Strain: Not on file  Food Insecurity: Low Risk  (02/06/2024)   Received from [facility]   Hunger Vital Sign    Within the past 12 months, you worried that your food would run out before you got money to buy more: Never true    Within the past 12 months, the food you bought just didn't last and you didn't have money to get more. : Never true  Transportation Needs: No Transportation Needs (02/06/2024)   Received from [facility]    In the past 12 months, has lack  of reliable transportation kept you from medical appointments, meetings, work or from getting things needed for daily living? : No  Physical Activity: Not on file  Stress: Not on file  Social Connections: Not on file     Family History: The patient's family history includes Heart disease in his mother. There is no history of Diabetes.  ROS:   Please see the history of present illness.     All other systems reviewed and are negative.  EKGs/Labs/Other Studies Reviewed:    The following studies were reviewed today:  EKG Interpretation Date/Time:  Wednesday June 26 2024 10:08:54 EDT Ventricular Rate:  61 PR Interval:  196 QRS Duration:  162 QT Interval:  450 QTC Calculation: 453 R Axis:   25  Text Interpretation: Atrial-sensed ventricular-paced rhythm When compared with ECG of 25-Mar-2024 10:40, Vent. rate has decreased BY   3 BPM Confirmed by Madie Slough ([id number]) on 06/26/2024 10:21:24 AM    Recent Labs: No results found for requested labs within last 365 days.  Recent Lipid Panel    Component Value Date/Time   CHOL 127 11/09/2022 0000   TRIG 163 (H) 11/09/2022 0000   HDL 35 (L) 11/09/2022 0000   CHOLHDL 3.6 11/09/2022 0000   CHOLHDL 2.5 01/14/2021 0105   VLDL 26 01/14/2021 0105   LDLCALC 64 11/09/2022 0000     Risk Assessment/Calculations:                Physical Exam:    VS:  BP 130/68 (BP Location: Left Arm, Patient Position: Sitting)   Pulse 61   Ht 5' 9 (1.753 m)   Wt 208 lb 9.6 oz (94.6 kg)   SpO2 98%   BMI 30.80 kg/m     Wt Readings from Last 3 Encounters:  06/26/24 208 lb 9.6 oz (94.6 kg)  03/25/24 210 lb (95.3 kg)  02/13/24 208 lb 9.6 oz (94.6 kg)     GEN:  Well nourished, well developed in no acute distress HEENT: Normal NECK: No JVD; No carotid bruits LYMPHATICS: No lymphadenopathy CARDIAC: RRR, no murmurs, rubs, gallops RESPIRATORY:  Clear to auscultation without rales, wheezing or rhonchi  ABDOMEN: Soft, non-tender,  non-distended MUSCULOSKELETAL:  No edema; No deformity  SKIN: Warm and dry NEUROLOGIC:  Alert and oriented x 3 PSYCHIATRIC:  Normal affect   ASSESSMENT:    1. Essential (primary) hypertension  2. 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 Unstable angina (HCC)   4. Abnormal stress test   5. Primary hypertension   6. Hyperlipidemia with target LDL less than 70   7. Severe aortic stenosis   8. S/P TAVR (transcatheter aortic valve replacement)   9. CHB (complete heart block) (HCC)    PLAN:    In order of problems listed above:  Exertional chest pain CAD s/p CABG x 5 Most recent intervention to SVG in 2023 - high risk PET stress test with inferolateral ischemia - recommend heart catheterization - he is amenable - will schedule 07/02/24 with Dr Wonda second case - he remains on plavix  - continue imdur , ranexa , BB, statin   AS s/p TAVR - last echo 09/2023 with good valve function    CHB s/p PPM  - follows with EP   Follow up with me 2 weeks after cath.      Informed Consent   Shared Decision Making/Informed Consent The risks [stroke (1 in 1000), death (1 in 1000), kidney failure [usually temporary] (1 in 500), bleeding (1 in 200), allergic reaction [possibly serious] (1 in 200)], benefits (diagnostic support and management of coronary artery disease) and alternatives of a cardiac catheterization were discussed in detail with Mr. Ferrante and he is willing to proceed.       Medication Adjustments/Labs and Tests Ordered: Current medicines are reviewed at length with the patient today.  Concerns regarding medicines are outlined above.  Orders Placed This Encounter  Procedures   Basic metabolic panel with GFR   CBC   EKG 12-Lead   No orders of the defined types were placed in this encounter.   Patient Instructions  Medication Instructions:  Your physician recommends that you continue on your current medications as  directed. Please refer to the Current Medication list given to you today.  *If you need a refill on your cardiac medications before your next appointment, please call your pharmacy*  Lab Work: CBC, BMET today  Testing/Procedures: Your physician has requested that you have a left cardiac catheterization. Cardiac catheterization is used to diagnose and/or treat various heart conditions. Doctors may recommend this procedure for a number of different reasons. The most common reason is to evaluate chest pain. Chest pain can be a symptom of coronary artery disease (CAD), and cardiac catheterization can show whether plaque is narrowing or blocking your heart's arteries. This procedure is also used to evaluate the valves, as well as measure the blood flow and oxygen levels in different parts of your heart. For further information please visit [email or web address] Please follow instruction sheet, as given.   Follow-Up: At [facility], you and your health needs are our priority.  As part of our continuing mission to provide you with exceptional heart care, our providers are all part of one team.  This team includes your primary Cardiologist (physician) and Advanced Practice Providers or APPs (Physician Assistants and Nurse Practitioners) who all work together to provide you with the care you need, when you need it.  Your next appointment:    Keep apt   Provider:   Jon Hails, PA-C          We recommend signing up for the patient portal called MyChart.  Sign up information is provided on this After Visit Summary.  MyChart is used to connect with patients for Virtual Visits (Telemedicine).  Patients are able to view lab/test results, encounter notes, upcoming appointments,  etc.  Non-urgent messages can be sent to your provider as well.   To learn more about what you can do with MyChart, go to [email or web address].   Other Instructions        Signed, Jon Nat Hails, [place]  06/26/2024  10:37 AM    [facility]

## 2024-06-24 NOTE — Progress Notes (Unsigned)
 Cardiology Office Note:    Date:  06/24/2024   ID:  [name] [name], [name] [name] Jul 04, 1946, MRN [id number]  PCP:  Katina Pfeiffer, PA-C   [facility] Providers Cardiologist:  [name], MD Electrophysiologist:  Will Gladis Norton, MD { Click to update primary MD,subspecialty MD or APP then REFRESH:1}    Referring MD: Katina Pfeiffer, PA-C   No chief complaint on file. ***  History of Present Illness:    [name] [name] is a 78 y.o. 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   He underwent right common femoral endarterectomy in 2022. Heart catheterization 03/2022 with native disease, patent LIMA-LAD, SVG-D1, 80% stenosis in the distal SVG-marginal treated with scoring balloon angioplasty, and patent SVG-dRCA. He underwent TAVR 09/2022 with subsequent CHB and PPM placement.   He underwent PET stress test 06/11/2024 that showed concern for inferolateral ischemia, large reversible perfusion defect.   He presents today to discuss heart catheterization.     CAD s/p CABG x 5 Most recent intervention to SVG in 2023 - high risk PET stress test with inferolateral ischemia - recommend heart catheterization    AS s/p TAVR - last echo 09/2023 with good valve function -    CHB s/p PPM  - follows with EP        Past Medical History:  Diagnosis Date   Carotid artery occlusion    Coronary atherosclerosis of artery bypass graft    CABG 2009, LHC 07/2016 restenosis of SVG to OM treated with DES   Diabetes mellitus (HCC)    Essential hypertension, benign    Fatty liver    History of kidney stones    HTN (hypertension)    PAD (peripheral artery disease) (HCC)    Pure hypercholesterolemia    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  Fell & Dr. Lucas   Severe aortic stenosis     Past Surgical History:  Procedure Laterality Date   ABDOMINAL AORTOGRAM W/LOWER EXTREMITY N/A 12/30/2020   Procedure: ABDOMINAL AORTOGRAM W/LOWER EXTREMITY;  Surgeon: Darron Deatrice LABOR, MD;  Location: [facility] INVASIVE CV LAB;  Service: Cardiovascular;  Laterality: N/A;   ADENOIDECTOMY     CARDIAC CATHETERIZATION N/A 07/18/2016   Procedure: Left Heart Cath and Cors/Grafts Angiography;  Surgeon: Lonni JONETTA Cash, MD;  Location: [facility] INVASIVE CV LAB;  Service: Cardiovascular;  Laterality: N/A;   CARDIAC CATHETERIZATION N/A 07/18/2016   Procedure: Coronary Stent Intervention;  Surgeon: Lonni JONETTA Cash, MD;  Location: [facility] INVASIVE CV LAB;  Service: Cardiovascular;  Laterality: N/A;   CHOLECYSTECTOMY     CORONARY ANGIOPLASTY WITH STENT PLACEMENT     CORONARY ARTERY BYPASS GRAFT     CORONARY BALLOON ANGIOPLASTY N/A 04/01/2022   Procedure: CORONARY BALLOON ANGIOPLASTY;  Surgeon: Dann Candyce RAMAN, MD;  Location: [facility] INVASIVE CV LAB;  Service: Cardiovascular;  Laterality: N/A;  SVG-OM1   CORONARY BALLOON ANGIOPLASTY N/A 09/10/2021   Procedure: CORONARY BALLOON ANGIOPLASTY;  Surgeon: Fell Ozell, MD;  Location: [facility] INVASIVE CV LAB;  Service: Cardiovascular;  Laterality: N/A;   CORONARY STENT INTERVENTION N/A 01/11/2019   Procedure: CORONARY STENT INTERVENTION;  Surgeon: Fell Ozell, MD;  Location: [facility] INVASIVE CV LAB;  Service: Cardiovascular;  Laterality: N/A;   ENDARTERECTOMY FEMORAL Right 01/13/2021   Procedure: RIGHT FEMORAL ENDARTERECTOMY  WITH PATCH ANGIOPLASTY;  Surgeon: Oris Krystal FALCON, MD;  Location: [facility] OR;  Service: Vascular;  Laterality: Right;   INGUINAL HERNIA REPAIR Right    INTRAOPERATIVE TRANSTHORACIC ECHOCARDIOGRAM N/A 10/04/2022   Procedure: INTRAOPERATIVE TRANSTHORACIC ECHOCARDIOGRAM;  Surgeon: Wonda Sharper, MD;  Location: [facility] OR;  Service: Open Heart Surgery;  Laterality: N/A;   LEFT HEART CATH AND CORS/GRAFTS ANGIOGRAPHY  N/A 01/11/2019   Procedure: LEFT HEART CATH AND CORS/GRAFTS ANGIOGRAPHY;  Surgeon: Wonda Sharper, MD;  Location: [facility] INVASIVE CV LAB;  Service: Cardiovascular;  Laterality: N/A;   LEFT HEART CATH AND CORS/GRAFTS ANGIOGRAPHY N/A 04/01/2022   Procedure: LEFT HEART CATH AND CORS/GRAFTS ANGIOGRAPHY;  Surgeon: Dann Candyce RAMAN, MD;  Location: [facility] INVASIVE CV LAB;  Service: Cardiovascular;  Laterality: N/A;   LEFT HEART CATH AND CORS/GRAFTS ANGIOGRAPHY N/A 09/10/2021   Procedure: LEFT HEART CATH AND CORS/GRAFTS ANGIOGRAPHY;  Surgeon: Wonda Sharper, MD;  Location: [facility] INVASIVE CV LAB;  Service: Cardiovascular;  Laterality: N/A;   PACEMAKER IMPLANT N/A 10/05/2022   Procedure: PACEMAKER IMPLANT;  Surgeon: Inocencio Soyla Lunger, MD;  Location: [facility] INVASIVE CV LAB;  Service: Cardiovascular;  Laterality: N/A;   Right femoral endarterectomy and Dacron patch angioplasty  01/13/3021   TONSILLECTOMY     TRANSCATHETER AORTIC VALVE REPLACEMENT, TRANSFEMORAL Left 10/04/2022   Procedure: Transcatheter Aortic Valve Replacement, Transfemoral;  Surgeon: Wonda Sharper, MD;  Location: [facility] OR;  Service: Open Heart Surgery;  Laterality: Left;    Current Medications: No outpatient medications have been marked as taking for the 06/26/24 encounter (Appointment) with Madie Jon Garre, PA.     Allergies:   Empagliflozin   Social History   Socioeconomic History   Marital status: Married    Spouse name: Not on file   Number of children: Not on file   Years of education: Not on file   Highest education level: Not on file  Occupational History   Not on file  Tobacco Use   Smoking status: Never   Smokeless tobacco: Never  Vaping Use   Vaping status: Never Used  Substance and Sexual Activity   Alcohol use: No   Drug use: No   Sexual activity: Not on file  Other Topics Concern   Not on file  Social History Narrative   Not on file   Social Drivers of Health   Financial Resource Strain: Not on file  Food  Insecurity: Low Risk  (02/06/2024)   Received from [facility]   Hunger Vital Sign    Within the past 12 months, you worried that your food would run out before you got money to buy more: Never true    Within the past 12 months, the food you bought just didn't last and you didn't have money to get more. : Never true  Transportation Needs: No Transportation Needs (02/06/2024)   Received from [facility]    In the past 12 months, has lack of reliable transportation kept you from medical appointments, meetings, work or from getting things needed for daily living? : No  Physical Activity: Not on file  Stress: Not on file  Social Connections: Not on file     Family History: The patient's ***family history includes Heart disease in his mother. There is no history of Diabetes.  ROS:   Please see the history of present illness.    *** All other systems reviewed and are negative.  EKGs/Labs/Other Studies Reviewed:    The following studies were reviewed today: ***  Recent Labs: No results found for requested labs within last 365 days.  Recent Lipid Panel    Component Value Date/Time   CHOL 127 11/09/2022 0000   TRIG 163 (H) 11/09/2022 0000   HDL 35 (L) 11/09/2022 0000   CHOLHDL 3.6 11/09/2022 0000   CHOLHDL 2.5 01/14/2021 0105   VLDL 26 01/14/2021 0105   LDLCALC 64 11/09/2022 0000     Risk Assessment/Calculations:   {Does this patient have ATRIAL FIBRILLATION?[phone]}  No BP recorded.  {Refresh Note OR Click here to enter BP  :1}***         Physical Exam:    VS:  There were no vitals taken for this visit.    Wt Readings from Last 3 Encounters:  03/25/24 210 lb (95.3 kg)  02/13/24 208 lb 9.6 oz (94.6 kg)  01/24/24 207 lb (93.9 kg)     GEN: *** Well nourished, well developed in no acute distress HEENT: Normal NECK: No JVD; No carotid bruits LYMPHATICS: No lymphadenopathy CARDIAC: ***RRR, no murmurs, rubs, gallops RESPIRATORY:  Clear to  auscultation without rales, wheezing or rhonchi  ABDOMEN: Soft, non-tender, non-distended MUSCULOSKELETAL:  No edema; No deformity  SKIN: Warm and dry NEUROLOGIC:  Alert and oriented x 3 PSYCHIATRIC:  Normal affect   ASSESSMENT:    No diagnosis found. PLAN:    In order of problems listed above:  ***      {Are you ordering a CV Procedure (e.g. stress test, cath, DCCV, TEE, etc)?   Press F2        :[phone]}    Medication Adjustments/Labs and Tests Ordered: Current medicines are reviewed at length with the patient today.  Concerns regarding medicines are outlined above.  No orders of the defined types were placed in this encounter.  No orders of the defined types were placed in this encounter.   There are no Patient Instructions on file for this visit.   Signed, Jon Nat Hails, [place]  06/24/2024 3:58 PM    [facility]

## 2024-06-26 ENCOUNTER — Encounter: Payer: Self-pay | Admitting: Physician Assistant

## 2024-06-26 ENCOUNTER — Ambulatory Visit: Attending: Physician Assistant | Admitting: Physician Assistant

## 2024-06-26 VITALS — BP 130/68 | HR 61 | Ht 69.0 in | Wt 208.6 lb

## 2024-06-26 DIAGNOSIS — E785 Hyperlipidemia, unspecified: Secondary | ICD-10-CM | POA: Insufficient documentation

## 2024-06-26 DIAGNOSIS — I350 Nonrheumatic aortic (valve) stenosis: Secondary | ICD-10-CM | POA: Diagnosis present

## 2024-06-26 DIAGNOSIS — I10 Essential (primary) hypertension: Secondary | ICD-10-CM | POA: Insufficient documentation

## 2024-06-26 DIAGNOSIS — R9439 Abnormal result of other cardiovascular function study: Secondary | ICD-10-CM | POA: Diagnosis not present

## 2024-06-26 DIAGNOSIS — I25709 Atherosclerosis of coronary artery bypass graft(s), unspecified, with unspecified angina pectoris: Secondary | ICD-10-CM | POA: Diagnosis not present

## 2024-06-26 DIAGNOSIS — I200 Unstable angina: Secondary | ICD-10-CM | POA: Diagnosis present

## 2024-06-26 DIAGNOSIS — Z952 Presence of prosthetic heart valve: Secondary | ICD-10-CM | POA: Diagnosis present

## 2024-06-26 DIAGNOSIS — I442 Atrioventricular block, complete: Secondary | ICD-10-CM | POA: Diagnosis present

## 2024-06-26 NOTE — Patient Instructions (Signed)
 Medication Instructions:  Your physician recommends that you continue on your current medications as directed. Please refer to the Current Medication list given to you today.  *If you need a refill on your cardiac medications before your next appointment, please call your pharmacy*  Lab Work: CBC, BMET today  Testing/Procedures: Your physician has requested that you have a left cardiac catheterization. Cardiac catheterization is used to diagnose and/or treat various heart conditions. Doctors may recommend this procedure for a number of different reasons. The most common reason is to evaluate chest pain. Chest pain can be a symptom of coronary artery disease (CAD), and cardiac catheterization can show whether plaque is narrowing or blocking your heart's arteries. This procedure is also used to evaluate the valves, as well as measure the blood flow and oxygen levels in different parts of your heart. For further information please visit [email or web address] Please follow instruction sheet, as given.   Follow-Up: At [facility], you and your health needs are our priority.  As part of our continuing mission to provide you with exceptional heart care, our providers are all part of one team.  This team includes your primary Cardiologist (physician) and Advanced Practice Providers or APPs (Physician Assistants and Nurse Practitioners) who all work together to provide you with the care you need, when you need it.  Your next appointment:    Keep apt   Provider:   Jon Hails, PA-C          We recommend signing up for the patient portal called MyChart.  Sign up information is provided on this After Visit Summary.  MyChart is used to connect with patients for Virtual Visits (Telemedicine).  Patients are able to view lab/test results, encounter notes, upcoming appointments, etc.  Non-urgent messages can be sent to your provider as well.   To learn more about what you can do with MyChart, go to  [email or web address].   Other Instructions  [facility] A DEPT OF [place]. [facility] AT [address] A DEPT OF THE [place]. [facility] [address] [place] [address] Dept: [phone] Loc: [phone]  [name] [name]  06/26/2024  You are scheduled for a Cardiac Catheterization on Tuesday, September 16 with Dr. [name].  1. Please arrive at the [facility] (Main Entrance A) at [facility]: [address] [place], [place] [address] at 10:00 AM (This time is 2 hour(s) before your procedure to ensure your preparation).   Free valet parking service is available. You will check in at ADMITTING. The support person will be asked to wait in the waiting room.  It is OK to have someone drop you off and come back when you are ready to be discharged.    Special note: Every effort is made to have your procedure done on time. Please understand that emergencies sometimes delay scheduled procedures.  2. Diet: Nothing to eat after midnight.   3. Hydration: You need to be well hydrated before your procedure. On September 16, you may drink approved liquids (see below) until 2 hours before the procedure, with 16 oz of water as your last intake.   List of approved liquids water, clear juice, clear tea, black coffee, fruit juices, non-citric and without pulp, carbonated beverages, Gatorade, Kool -Aid, plain Jello-O and plain ice popsicles.  4. Labs: You will need to have blood drawn on Wednesday, September 10 at [facility] (1st Floor),  [address], [place], [place] [address]. You do not need to be fasting.  5. Medication instructions in preparation for your procedure:   Contrast Allergy: No   Stop taking, Lisinopril (Zestril or Prinivil) Tuesday, September 16,, HTCZ (Hydrochlorothiazide ) Tuesday, September 16,   Take only 7 units of insulin  the night before your procedure. Do not take any insulin   on the day of the procedure.  Do not take Diabetes Med Glucophage  (Metformin ) on the day of the procedure and HOLD 48 HOURS AFTER THE PROCEDURE.  On the morning of your procedure, take your Aspirin  81 mg and Plavix /Clopidogrel  and any morning medicines NOT listed above.  You may use sips of water.  6. Plan to go home the same day, you will only stay overnight if medically necessary. 7. Bring a current list of your medications and current insurance cards. 8. You MUST have a responsible person to drive you home. 9. Someone MUST be with you the first 24 hours after you arrive home or your discharge will be delayed. 10. Please wear clothes that are easy to get on and off and wear slip-on shoes.  Thank you for allowing us  to care for you!   -- [facility]

## 2024-06-27 ENCOUNTER — Other Ambulatory Visit: Payer: Self-pay | Admitting: Cardiovascular Disease

## 2024-06-27 ENCOUNTER — Ambulatory Visit: Payer: Self-pay | Admitting: Physician Assistant

## 2024-06-27 DIAGNOSIS — I350 Nonrheumatic aortic (valve) stenosis: Secondary | ICD-10-CM

## 2024-06-27 LAB — BASIC METABOLIC PANEL WITH GFR
BUN/Creatinine Ratio: 12 (ref 10–24)
BUN: 14 mg/dL (ref 8–27)
CO2: 18 mmol/L — AB (ref 20–29)
Calcium: 9.7 mg/dL (ref 8.6–10.2)
Chloride: 103 mmol/L (ref 96–106)
Creatinine, Ser: 1.16 mg/dL (ref 0.76–1.27)
Glucose: 126 mg/dL — AB (ref 70–99)
Potassium: 5 mmol/L (ref 3.5–5.2)
Sodium: 140 mmol/L (ref 134–144)
eGFR: 64 mL/min/1.73 (ref >59)

## 2024-06-27 LAB — CBC
Hematocrit: 40.8 % (ref 37.5–51.0)
Hemoglobin: 12.9 g/dL — ABNORMAL LOW (ref 13.0–17.7)
MCH: 28.2 pg (ref 26.6–33.0)
MCHC: 31.6 g/dL (ref 31.5–35.7)
MCV: 89 fL (ref 79–97)
Platelets: 273 x10E3/uL (ref 150–450)
RBC: 4.57 x10E6/uL (ref 4.14–5.80)
RDW: 13.8 % (ref 11.6–15.4)
WBC: 10 x10E3/uL (ref 3.4–10.8)

## 2024-06-27 NOTE — Telephone Encounter (Signed)
Lab results reviewed via mychart.

## 2024-07-01 ENCOUNTER — Telehealth: Payer: Self-pay | Admitting: *Deleted

## 2024-07-01 NOTE — Telephone Encounter (Signed)
 Cardiac Catheterization scheduled at [facility] for: Tuesday July 02, 2024 12 Noon Arrival time [facility] Main Entrance A at: 10 AM  Diet:  -May have light meal until 6 AM (6 hours before procedure time) Approved light meal consists of plain toast, fruit, light soups, crackers.  Hydration: -May drink clear liquids until 2 hours before the procedure.  Approved liquids: Water , clear tea, black coffee, fruit juices-non-citric and without pulp,Gatorade, plain Jello/popsicles.   -Please drink 16 oz of water  2 hours before procedure.  Medication instructions: -Hold:  Insulin -AM of procedure /1/2 usual Insulin  dose HS prior to procedure  Metformin -day of procedure and 48 hours post procedure  Hydrochlorothiazide -AM of procedure -Other usual morning medications can be taken including aspirin  81 mg and Plavix  75 mg.   Plan to go home the same day, you will only stay overnight if medically necessary.  You must have responsible adult to drive you home.  Someone must be with you the first 24 hours after you arrive home.  Reviewed procedure instructions with patient.

## 2024-07-02 ENCOUNTER — Other Ambulatory Visit: Payer: Self-pay

## 2024-07-02 ENCOUNTER — Encounter (HOSPITAL_COMMUNITY)
Admission: RE | Disposition: A | Discharge status: Home / Self Care | Payer: Self-pay | Source: Home / Self Care | Attending: Cardiovascular Disease

## 2024-07-02 ENCOUNTER — Ambulatory Visit (HOSPITAL_COMMUNITY)
Admission: RE | Admit: 2024-07-02 | Discharge: 2024-07-02 | Disposition: A | Discharge status: Home / Self Care | Attending: Cardiovascular Disease | Admitting: Cardiovascular Disease

## 2024-07-02 DIAGNOSIS — I2582 Chronic total occlusion of coronary artery: Secondary | ICD-10-CM | POA: Insufficient documentation

## 2024-07-02 DIAGNOSIS — I25700 Atherosclerosis of coronary artery bypass graft(s), unspecified, with unstable angina pectoris: Secondary | ICD-10-CM | POA: Diagnosis present

## 2024-07-02 DIAGNOSIS — Z79899 Other long term (current) drug therapy: Secondary | ICD-10-CM | POA: Insufficient documentation

## 2024-07-02 DIAGNOSIS — E785 Hyperlipidemia, unspecified: Secondary | ICD-10-CM | POA: Insufficient documentation

## 2024-07-02 DIAGNOSIS — E1151 Type 2 diabetes mellitus with diabetic peripheral angiopathy without gangrene: Secondary | ICD-10-CM | POA: Insufficient documentation

## 2024-07-02 DIAGNOSIS — I25119 Atherosclerotic heart disease of native coronary artery with unspecified angina pectoris: Secondary | ICD-10-CM | POA: Diagnosis not present

## 2024-07-02 DIAGNOSIS — Z952 Presence of prosthetic heart valve: Secondary | ICD-10-CM | POA: Insufficient documentation

## 2024-07-02 DIAGNOSIS — Z7984 Long term (current) use of oral hypoglycemic drugs: Secondary | ICD-10-CM | POA: Insufficient documentation

## 2024-07-02 DIAGNOSIS — I350 Nonrheumatic aortic (valve) stenosis: Secondary | ICD-10-CM | POA: Diagnosis not present

## 2024-07-02 DIAGNOSIS — I2584 Coronary atherosclerosis due to calcified coronary lesion: Secondary | ICD-10-CM | POA: Insufficient documentation

## 2024-07-02 DIAGNOSIS — I10 Essential (primary) hypertension: Secondary | ICD-10-CM | POA: Diagnosis not present

## 2024-07-02 DIAGNOSIS — Z794 Long term (current) use of insulin: Secondary | ICD-10-CM | POA: Insufficient documentation

## 2024-07-02 DIAGNOSIS — Z7985 Long-term (current) use of injectable non-insulin antidiabetic drugs: Secondary | ICD-10-CM | POA: Diagnosis not present

## 2024-07-02 DIAGNOSIS — Z950 Presence of cardiac pacemaker: Secondary | ICD-10-CM | POA: Diagnosis not present

## 2024-07-02 DIAGNOSIS — Z7902 Long term (current) use of antithrombotics/antiplatelets: Secondary | ICD-10-CM | POA: Insufficient documentation

## 2024-07-02 DIAGNOSIS — I442 Atrioventricular block, complete: Secondary | ICD-10-CM | POA: Diagnosis not present

## 2024-07-02 DIAGNOSIS — Z006 Encounter for examination for normal comparison and control in clinical research program: Secondary | ICD-10-CM

## 2024-07-02 HISTORY — PX: CORONARY/GRAFT ANGIOGRAPHY: CATH118237

## 2024-07-02 LAB — GLUCOSE, CAPILLARY: Glucose-Capillary: 213 mg/dL — ABNORMAL HIGH (ref 70–99)

## 2024-07-02 SURGERY — CORONARY/GRAFT ANGIOGRAPHY
Anesthesia: LOCAL

## 2024-07-02 MED ORDER — HYDRALAZINE HCL 20 MG/ML IJ SOLN: 10.0000 mg | INTRAMUSCULAR | Status: DC | PRN

## 2024-07-02 MED ORDER — FREE WATER: 500.0000 mL | Freq: Once | Status: DC

## 2024-07-02 MED ORDER — LIDOCAINE HCL (PF) 1 % IJ SOLN
INTRAMUSCULAR | Status: AC
  Filled 2024-07-02: qty 30

## 2024-07-02 MED ORDER — SODIUM CHLORIDE 0.9 % IV SOLN: 250.0000 mL | INTRAVENOUS | Status: DC | PRN

## 2024-07-02 MED ORDER — HEPARIN SODIUM (PORCINE) 1000 UNIT/ML IJ SOLN
INTRAMUSCULAR | Status: DC | PRN
  Administered 2024-07-02: 5000 [IU] via INTRAVENOUS

## 2024-07-02 MED ORDER — VERAPAMIL HCL 2.5 MG/ML IV SOLN
INTRAVENOUS | Status: DC | PRN
  Administered 2024-07-02: 10 mL via INTRA_ARTERIAL

## 2024-07-02 MED ORDER — MIDAZOLAM HCL 2 MG/2ML IJ SOLN
INTRAMUSCULAR | Status: DC | PRN
  Administered 2024-07-02: 2 mg via INTRAVENOUS

## 2024-07-02 MED ORDER — HEPARIN SODIUM (PORCINE) 1000 UNIT/ML IJ SOLN
INTRAMUSCULAR | Status: AC
  Filled 2024-07-02: qty 10

## 2024-07-02 MED ORDER — FENTANYL CITRATE (PF) 100 MCG/2ML IJ SOLN
INTRAMUSCULAR | Status: AC
  Filled 2024-07-02: qty 2

## 2024-07-02 MED ORDER — HEPARIN (PORCINE) IN NACL 1000-0.9 UT/500ML-% IV SOLN
INTRAVENOUS | Status: DC | PRN
  Administered 2024-07-02 (×2): 500 mL via INTRA_ARTERIAL

## 2024-07-02 MED ORDER — ACETAMINOPHEN 325 MG PO TABS: 650.0000 mg | ORAL_TABLET | ORAL | Status: DC | PRN

## 2024-07-02 MED ORDER — ASPIRIN 81 MG PO CHEW: 81.0000 mg | CHEWABLE_TABLET | ORAL | Status: DC

## 2024-07-02 MED ORDER — FENTANYL CITRATE (PF) 100 MCG/2ML IJ SOLN
INTRAMUSCULAR | Status: DC | PRN
  Administered 2024-07-02: 25 ug via INTRAVENOUS

## 2024-07-02 MED ORDER — LIDOCAINE HCL (PF) 1 % IJ SOLN
INTRAMUSCULAR | Status: DC | PRN
  Administered 2024-07-02: 2 mL

## 2024-07-02 MED ORDER — SODIUM CHLORIDE 0.9% FLUSH
3.0000 mL | Freq: Two times a day (BID) | INTRAVENOUS | Status: DC
Start: 2024-07-02 — End: 2024-07-03

## 2024-07-02 MED ORDER — LABETALOL HCL 5 MG/ML IV SOLN: 10.0000 mg | INTRAVENOUS | Status: DC | PRN

## 2024-07-02 MED ORDER — ONDANSETRON HCL 4 MG/2ML IJ SOLN: 4.0000 mg | Freq: Four times a day (QID) | INTRAMUSCULAR | Status: DC | PRN

## 2024-07-02 MED ORDER — SODIUM CHLORIDE 0.9% FLUSH: 3.0000 mL | INTRAVENOUS | Status: DC | PRN

## 2024-07-02 MED ORDER — IOHEXOL 350 MG/ML SOLN
INTRAVENOUS | Status: DC | PRN
  Administered 2024-07-02: 60 mL via INTRA_ARTERIAL

## 2024-07-02 MED ORDER — MIDAZOLAM HCL 2 MG/2ML IJ SOLN
INTRAMUSCULAR | Status: AC
  Filled 2024-07-02: qty 2

## 2024-07-02 SURGICAL SUPPLY — 8 items
CATH INFINITI 5 FR MPA2 (CATHETERS) IMPLANT
CATH INFINITI 5FR MULTPACK ANG (CATHETERS) IMPLANT
DEVICE RAD COMP TR BAND LRG (VASCULAR PRODUCTS) IMPLANT
GLIDESHEATH SLEND SS 6F .021 (SHEATH) IMPLANT
GUIDEWIRE INQWIRE 1.5J.035X260 (WIRE) IMPLANT
PACK CARDIAC CATHETERIZATION (CUSTOM PROCEDURE TRAY) ×1 IMPLANT
SET ATX-X65L (MISCELLANEOUS) IMPLANT
STATION PROTECTION PRESSURIZED (MISCELLANEOUS) IMPLANT

## 2024-07-02 NOTE — Interval H&P Note (Signed)
 History and Physical Interval Note:  07/02/2024 1:44 PM  [name] [name]  has presented today for surgery, with the diagnosis of angina.  The various methods of treatment have been discussed with the patient and family. After consideration of risks, benefits and other options for treatment, the patient has consented to  Procedure(s): LEFT HEART CATH AND CORS/GRAFTS ANGIOGRAPHY (N/A) as a surgical intervention.  The patient's history has been reviewed, patient examined, no change in status, stable for surgery.  I have reviewed the patient's chart and labs.  Questions were answered to the patient's satisfaction.     [name]

## 2024-07-02 NOTE — Research (Cosign Needed Addendum)
 Prevail Informed Consent   Subject Name: [name] [name]  Subject met inclusion and exclusion criteria.  The informed consent form, study requirements and expectations were reviewed with the subject and questions and concerns were addressed prior to the signing of the consent form.  The subject verbalized understanding of the trial requirements.  The subject agreed to participate in the Prevail trial and signed the informed consent at 1015 on 07/02/2024.  The informed consent was obtained prior to performance of any protocol-specific procedures for the subject.  A copy of the signed informed consent was given to the subject and a copy was placed in the subject's medical record.   Venice Liz   Screen fail

## 2024-07-02 NOTE — Interval H&P Note (Signed)
 History and Physical Interval Note:  07/02/2024 10:06 AM  [name] [name]  has presented today for surgery, with the diagnosis of angina.  The various methods of treatment have been discussed with the patient and family. After consideration of risks, benefits and other options for treatment, the patient has consented to  Procedure(s): LEFT HEART CATH AND CORS/GRAFTS ANGIOGRAPHY (N/A) as a surgical intervention.  The patient's history has been reviewed, patient examined, no change in status, stable for surgery.  I have reviewed the patient's chart and labs.  Questions were answered to the patient's satisfaction.     [name]

## 2024-07-02 NOTE — Discharge Instructions (Signed)

## 2024-07-03 ENCOUNTER — Encounter (HOSPITAL_COMMUNITY): Payer: Self-pay | Admitting: Cardiovascular Disease

## 2024-07-04 ENCOUNTER — Ambulatory Visit (INDEPENDENT_AMBULATORY_CARE_PROVIDER_SITE_OTHER): Payer: Medicare Other

## 2024-07-04 DIAGNOSIS — I442 Atrioventricular block, complete: Secondary | ICD-10-CM | POA: Diagnosis not present

## 2024-07-04 LAB — CUP PACEART REMOTE DEVICE CHECK
Battery Remaining Longevity: 135 mo
Battery Voltage: 3.03 V
Brady Statistic AP VP Percent: 34.79 %
Brady Statistic AP VS Percent: 0 %
Brady Statistic AS VP Percent: 64.55 %
Brady Statistic AS VS Percent: 0.66 %
Brady Statistic RA Percent Paced: 35.1 %
Brady Statistic RV Percent Paced: 99.34 %
Date Time Interrogation Session: 20250917213524
Implantable Lead Connection Status: 753985
Implantable Lead Connection Status: 753985
Implantable Lead Implant Date: 20231220
Implantable Lead Implant Date: 20231220
Implantable Lead Location: 753859
Implantable Lead Location: 753860
Implantable Lead Model: 3830
Implantable Lead Model: 5076
Implantable Pulse Generator Implant Date: 20231220
Lead Channel Impedance Value: 323 Ohm
Lead Channel Impedance Value: 380 Ohm
Lead Channel Impedance Value: 399 Ohm
Lead Channel Impedance Value: 627 Ohm
Lead Channel Pacing Threshold Amplitude: 0.75 V
Lead Channel Pacing Threshold Amplitude: 0.875 V
Lead Channel Pacing Threshold Pulse Width: 0.4 ms
Lead Channel Pacing Threshold Pulse Width: 0.4 ms
Lead Channel Sensing Intrinsic Amplitude: 12.125 mV
Lead Channel Sensing Intrinsic Amplitude: 12.125 mV
Lead Channel Sensing Intrinsic Amplitude: 3.25 mV
Lead Channel Sensing Intrinsic Amplitude: 3.25 mV
Lead Channel Setting Pacing Amplitude: 1.5 V
Lead Channel Setting Pacing Amplitude: 2 V
Lead Channel Setting Pacing Pulse Width: 0.4 ms
Lead Channel Setting Sensing Sensitivity: 1.2 mV
Zone Setting Status: 755011
Zone Setting Status: 755011

## 2024-07-06 ENCOUNTER — Ambulatory Visit: Payer: Self-pay | Admitting: Cardiology

## 2024-07-09 NOTE — Progress Notes (Signed)
 Remote PPM Transmission

## 2024-07-24 ENCOUNTER — Ambulatory Visit: Admitting: Physician Assistant

## 2024-07-25 ENCOUNTER — Telehealth: Payer: Self-pay | Admitting: Cardiovascular Disease

## 2024-07-25 MED ORDER — ISOSORBIDE MONONITRATE ER 60 MG PO TB24: 60.0000 mg | ORAL_TABLET | Freq: Two times a day (BID) | ORAL | 3 refills | Status: DC

## 2024-07-25 NOTE — Telephone Encounter (Signed)
 Pt's medication was sent to pt's pharmacy as requested. Confirmation received.

## 2024-07-25 NOTE — Telephone Encounter (Signed)
 *  STAT* If patient is at the pharmacy, call can be transferred to refill team.   1. Which medications need to be refilled? (please list name of each medication and dose if known)   isosorbide  mononitrate (IMDUR ) 60 MG 24 hr tablet   2. Which pharmacy/location (including street and city if local pharmacy) is medication to be sent to?  [id number] - [place], [place] - [address]   3. Do they need a 30 day or 90 day supply? 90 day  Patient is out of medication

## 2024-07-31 NOTE — Progress Notes (Unsigned)
 Cardiology Office Note:    Date:  08/01/2024   ID:  [name] [name] 12-11-1945, MRN [id number]  PCP:  Katina Pfeiffer, PA-C   [facility] Providers Cardiologist:  [name], MD Cardiology APP:  Madie Jon Garre, [place]  Electrophysiologist:  Soyla Gladis Norton, MD     Referring MD: Katina Pfeiffer, PA-C   Chief Complaint  Patient presents with   Follow-up    Post cath    History of Present Illness:    [name] [name] is a 78 y.o. 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   He underwent right common femoral endarterectomy in 2022. Heart catheterization 03/2022 with native disease, patent LIMA-LAD, SVG-D1, 80% stenosis in the distal SVG-marginal treated with scoring balloon angioplasty, and patent SVG-dRCA. He underwent TAVR 09/2022 with subsequent CHB and PPM placement.   He had a PET stress test 06/11/2024 that showed concern for inferolateral ischemia, large reversible perfusion defect.  He proceeded to heart catheterization on 07/02/2024 that showed severe native three-vessel disease with known occlusion of the RCA and LCA.  He had a patent LIMA-LAD, patent SVG-diagonal, patent SVG-distal RCA with severe ISR of the PDA stent and severe diffuse PDA stenosis.  He had interval occlusion of the SVG-OM which was a previous site of multiple stents and PCI's.  No intervention, medical therapy was recommended for diffuse distal disease.  He presents back for cardiology follow-up.  He continues to have exertional chest pain relieved with rest. He continues to take intermittent NTG. This morning he accidentally took 120 mg imdur  - BP today 136/60.    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 Fell & Dr. Lucas   Severe aortic stenosis     Past Surgical History:  Procedure Laterality Date   ABDOMINAL AORTOGRAM W/LOWER EXTREMITY N/A 12/30/2020   Procedure: ABDOMINAL AORTOGRAM W/LOWER EXTREMITY;  Surgeon: Darron Deatrice LABOR, MD;  Location: [facility] INVASIVE CV LAB;  Service: Cardiovascular;  Laterality: N/A;   ADENOIDECTOMY     CARDIAC CATHETERIZATION N/A 07/18/2016   Procedure: Left Heart Cath and Cors/Grafts Angiography;  Surgeon: Lonni JONETTA Cash, MD;  Location: [facility] INVASIVE CV LAB;  Service: Cardiovascular;  Laterality: N/A;   CARDIAC CATHETERIZATION N/A 07/18/2016   Procedure: Coronary Stent Intervention;  Surgeon: Lonni JONETTA Cash, MD;  Location: [facility] INVASIVE CV LAB;  Service: Cardiovascular;  Laterality: N/A;   CHOLECYSTECTOMY     CORONARY ANGIOPLASTY WITH STENT PLACEMENT     CORONARY ARTERY BYPASS GRAFT     CORONARY BALLOON ANGIOPLASTY N/A 04/01/2022   Procedure: CORONARY BALLOON ANGIOPLASTY;  Surgeon: Dann Candyce RAMAN, MD;  Location: [facility] INVASIVE CV LAB;  Service: Cardiovascular;  Laterality: N/A;  SVG-OM1   CORONARY BALLOON ANGIOPLASTY N/A 09/10/2021   Procedure: CORONARY BALLOON ANGIOPLASTY;  Surgeon: Fell Ozell, MD;  Location: [facility] INVASIVE  CV LAB;  Service: Cardiovascular;  Laterality: N/A;   CORONARY STENT INTERVENTION N/A 01/11/2019   Procedure: CORONARY STENT INTERVENTION;  Surgeon: Wonda Sharper, MD;  Location: [facility] INVASIVE CV LAB;  Service: Cardiovascular;  Laterality: N/A;   CORONARY/GRAFT ANGIOGRAPHY N/A 07/02/2024   Procedure: CORONARY/GRAFT ANGIOGRAPHY;  Surgeon: Wonda Sharper, MD;  Location: [facility] INVASIVE CV LAB;   Service: Cardiovascular;  Laterality: N/A;   ENDARTERECTOMY FEMORAL Right 01/13/2021   Procedure: RIGHT FEMORAL ENDARTERECTOMY  WITH PATCH ANGIOPLASTY;  Surgeon: Oris Krystal FALCON, MD;  Location: [facility] OR;  Service: Vascular;  Laterality: Right;   INGUINAL HERNIA REPAIR Right    INTRAOPERATIVE TRANSTHORACIC ECHOCARDIOGRAM N/A 10/04/2022   Procedure: INTRAOPERATIVE TRANSTHORACIC ECHOCARDIOGRAM;  Surgeon: Wonda Sharper, MD;  Location: [facility] OR;  Service: Open Heart Surgery;  Laterality: N/A;   LEFT HEART CATH AND CORS/GRAFTS ANGIOGRAPHY N/A 01/11/2019   Procedure: LEFT HEART CATH AND CORS/GRAFTS ANGIOGRAPHY;  Surgeon: Wonda Sharper, MD;  Location: [facility] INVASIVE CV LAB;  Service: Cardiovascular;  Laterality: N/A;   LEFT HEART CATH AND CORS/GRAFTS ANGIOGRAPHY N/A 04/01/2022   Procedure: LEFT HEART CATH AND CORS/GRAFTS ANGIOGRAPHY;  Surgeon: Dann Candyce RAMAN, MD;  Location: [facility] INVASIVE CV LAB;  Service: Cardiovascular;  Laterality: N/A;   LEFT HEART CATH AND CORS/GRAFTS ANGIOGRAPHY N/A 09/10/2021   Procedure: LEFT HEART CATH AND CORS/GRAFTS ANGIOGRAPHY;  Surgeon: Wonda Sharper, MD;  Location: [facility] INVASIVE CV LAB;  Service: Cardiovascular;  Laterality: N/A;   PACEMAKER IMPLANT N/A 10/05/2022   Procedure: PACEMAKER IMPLANT;  Surgeon: Inocencio Soyla Lunger, MD;  Location: [facility] INVASIVE CV LAB;  Service: Cardiovascular;  Laterality: N/A;   Right femoral endarterectomy and Dacron patch angioplasty  01/13/3021   TONSILLECTOMY     TRANSCATHETER AORTIC VALVE REPLACEMENT, TRANSFEMORAL Left 10/04/2022   Procedure: Transcatheter Aortic Valve Replacement, Transfemoral;  Surgeon: Wonda Sharper, MD;  Location: [facility] OR;  Service: Open Heart Surgery;  Laterality: Left;    Current Medications: Current Meds  Medication Sig   amLODipine  (NORVASC ) 10 MG tablet TAKE 1 TABLET BY MOUTH EVERY DAY   amoxicillin  (AMOXIL ) 500 MG tablet Take 4 tablets by mouth 1 hour prior to dental procedures and cleanings.   aspirin  81 MG EC  tablet TAKE 1 TABLET BY MOUTH EVERY DAY   benazepril  (LOTENSIN ) 40 MG tablet Take 40 mg by mouth daily.    clopidogrel  (PLAVIX ) 75 MG tablet TAKE 1 TABLET BY MOUTH EVERY DAY   ezetimibe  (ZETIA ) 10 MG tablet Take 1 tablet (10 mg total) by mouth daily.   finasteride  (PROSCAR ) 5 MG tablet Take 5 mg by mouth daily.   glucose blood (PRECISION QID TEST) test strip 1 each by Other route as directed.   hydrochlorothiazide  (HYDRODIURIL ) 25 MG tablet TAKE 1 TABLET (25 MG TOTAL) BY MOUTH DAILY.   Insulin  Pen Needle (B-D UF III MINI PEN NEEDLES) 31G X 5 MM MISC Use to inject Ozempic  once a week   metFORMIN  (GLUCOPHAGE ) 500 MG tablet Take 1,000 mg by mouth 2 (two) times daily with a meal.   metoprolol  tartrate (LOPRESSOR ) 50 MG tablet TAKE 1 TABLET BY MOUTH TWICE A DAY   Multiple Vitamin (MULTIVITAMIN WITH MINERALS) TABS tablet Take 1 tablet by mouth daily.   nitroGLYCERIN  (NITROSTAT ) 0.4 MG SL tablet DISSOLVE 1 TAB UNDER THE TONGUE EVERY 5 MINUTES AS NEEDED FOR CHEST PAIN. MAX OF 3 DOSES, THEN 911.   NOVOLIN N FLEXPEN 100 UNIT/ML FlexPen Inject 18 Units into the skin 2 (two) times daily.   omeprazole (PRILOSEC) 20 MG capsule Take 20  mg by mouth every morning.   ranolazine  (RANEXA ) 1000 MG SR tablet Take 1 tablet (1,000 mg total) by mouth 2 (two) times daily.   rosuvastatin  (CRESTOR ) 40 MG tablet Take 1 tablet (40 mg total) by mouth daily.   [DISCONTINUED] isosorbide  mononitrate (IMDUR ) 60 MG 24 hr tablet Take 1 tablet (60 mg total) by mouth 2 (two) times daily.   [DISCONTINUED] ranolazine  (RANEXA ) 500 MG 12 hr tablet TAKE 1 TABLET BY MOUTH TWICE A DAY     Allergies:   Empagliflozin   Social History   Socioeconomic History   Marital status: Married    Spouse name: Not on file   Number of children: Not on file   Years of education: Not on file   Highest education level: Not on file  Occupational History   Not on file  Tobacco Use   Smoking status: Never   Smokeless tobacco: Never  Vaping Use    Vaping status: Never Used  Substance and Sexual Activity   Alcohol use: No   Drug use: No   Sexual activity: Not on file  Other Topics Concern   Not on file  Social History Narrative   Not on file   Social Drivers of Health   Financial Resource Strain: Not on file  Food Insecurity: Low Risk  (02/06/2024)   Received from [facility]   Hunger Vital Sign    Within the past 12 months, you worried that your food would run out before you got money to buy more: Never true    Within the past 12 months, the food you bought just didn't last and you didn't have money to get more. : Never true  Transportation Needs: No Transportation Needs (02/06/2024)   Received from [employer]    In the past 12 months, has lack of reliable transportation kept you from medical appointments, meetings, work or from getting things needed for daily living? : No  Physical Activity: Not on file  Stress: Not on file  Social Connections: Not on file     Family History: The patient's family history includes Heart disease in his mother. There is no history of Diabetes.  ROS:   Please see the history of present illness.     All other systems reviewed and are negative.  EKGs/Labs/Other Studies Reviewed:    The following studies were reviewed today:  EKG Interpretation Date/Time:  Thursday August 01 2024 13:41:22 EDT Ventricular Rate:  72 PR Interval:  192 QRS Duration:  166 QT Interval:  448 QTC Calculation: 490 R Axis:   75  Text Interpretation: Atrial-sensed ventricular-paced rhythm When compared with ECG of 26-Jun-2024 10:08, Vent. rate has increased BY  11 BPM Confirmed by Madie Slough ([id number]) on 08/01/2024 1:48:02 PM    Recent Labs: 06/26/2024: BUN 14; Creatinine, Ser 1.16; Hemoglobin 12.9; Platelets 273; Potassium 5.0; Sodium 140  Recent Lipid Panel    Component Value Date/Time   CHOL 127 11/09/2022 0000   TRIG 163 (H) 11/09/2022 0000   HDL 35 (L) 11/09/2022 0000   CHOLHDL  3.6 11/09/2022 0000   CHOLHDL 2.5 01/14/2021 0105   VLDL 26 01/14/2021 0105   LDLCALC 64 11/09/2022 0000     Risk Assessment/Calculations:                Physical Exam:    VS:  BP 136/60   Pulse 72   Ht 5' 9 (1.753 m)   Wt 204 lb (92.5 kg)   SpO2 98%  BMI 30.13 kg/m     Wt Readings from Last 3 Encounters:  08/01/24 204 lb (92.5 kg)  07/02/24 208 lb (94.3 kg)  06/26/24 208 lb 9.6 oz (94.6 kg)     GEN:  Well nourished, well developed in no acute distress HEENT: Normal NECK: No JVD; No carotid bruits LYMPHATICS: No lymphadenopathy CARDIAC: RRR, no murmurs, rubs, gallops RESPIRATORY:  Clear to auscultation without rales, wheezing or rhonchi  ABDOMEN: Soft, non-tender, non-distended MUSCULOSKELETAL:  No edema; No deformity  SKIN: Warm and dry NEUROLOGIC:  Alert and oriented x 3 PSYCHIATRIC:  Normal affect  Left radial cath site C/D/I  ASSESSMENT:    1. CHB (complete heart block) (HCC)   2. Atherosclerotic heart disease of native coronary artery with unspecified angina pectoris   3. Essential (primary) hypertension   4. Mixed hyperlipidemia   5. Medication management   6. CAD S/P percutaneous coronary angioplasty   7. 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 Essential hypertension   9. S/P TAVR (transcatheter aortic valve replacement)   10. Insulin  dependent type 2 diabetes mellitus (HCC)   11. Hyperlipidemia with target LDL less than 70    PLAN:    In order of problems listed above:  CAD s/p CABG x 5 Most recent intervention to SVG in 2023 - high risk PET stress test with inferolateral ischemia - Heart catheterization as above, medical therapy recommended for diffuse distal disease, no PCI -Patent LIMA-LAD, SVG-diagonal, SVG-distal RCA -Interval occlusion of the proximal SVG-OM -Severe ISR of the PDA stent with severe diffuse PDA stenosis Continue 10 mg amlodipine , 81 mg aspirin , 75 mg Plavix , 60 mg  Imdur , 500 mg Ranexa  twice daily -- change to take 120 mg imdur  in the morning, increase ranexa  to 1000 mg BID   Hyperlipidemia with LDL goal less than 55 - Continue 10 mg Zetia , 40 mg rosuvastatin  -- LDL remains 85 -- will refer to pharmD for PCSK9i   Hypertension - Continue 10 mg amlodipine , 50 mg Lopressor  twice daily, 25 mg HCTZ -- change imdur  to 120 mg qAM   AS s/p TAVR - last echo 09/2023 with good valve function   CHB s/p PPM  - follows with EP   Follow up with me in 3 months.     Medication Adjustments/Labs and Tests Ordered: Current medicines are reviewed at length with the patient today.  Concerns regarding medicines are outlined above.  Orders Placed This Encounter  Procedures   AMB Referral to [facility] Pharm-D   EKG 12-Lead   Meds ordered this encounter  Medications   isosorbide  mononitrate (IMDUR ) 60 MG 24 hr tablet    Sig: Take 2 tablets (120 mg total) by mouth daily.    Dispense:  180 tablet    Refill:  3   ranolazine  (RANEXA ) 1000 MG SR tablet    Sig: Take 1 tablet (1,000 mg total) by mouth 2 (two) times daily.    Dispense:  180 tablet    Refill:  3    Patient Instructions  Medication Instructions:  Imdur  60 mg take two tablets (120 mg) daily   Increase Ranexa  1000 mg take one tablet twice daily *If you need a refill on your cardiac medications before your next appointment, please call your pharmacy*   Follow-Up: At [facility], you and your health needs are our priority.  As part of our continuing mission to provide you with exceptional heart care, our providers are all part of  one team.  This team includes your primary Cardiologist (physician) and Advanced Practice Providers or APPs (Physician Assistants and Nurse Practitioners) who all work together to provide you with the care you need, when you need it.  Your next appointment:   3 month(s)  Provider:   Jon Hails, PA-C          We recommend signing up for the patient  portal called MyChart.  Sign up information is provided on this After Visit Summary.  MyChart is used to connect with patients for Virtual Visits (Telemedicine).  Patients are able to view lab/test results, encounter notes, upcoming appointments, etc.  Non-urgent messages can be sent to your provider as well.   To learn more about what you can do with MyChart, go to [email or web address].   Other Instructions Referral sent to PharmD           Signed, Jon Nat Hails, PA  08/01/2024 2:46 PM    [facility]

## 2024-08-01 ENCOUNTER — Ambulatory Visit: Attending: Physician Assistant | Admitting: Physician Assistant

## 2024-08-01 ENCOUNTER — Encounter: Payer: Self-pay | Admitting: Physician Assistant

## 2024-08-01 VITALS — BP 136/60 | HR 72 | Ht 69.0 in | Wt 204.0 lb

## 2024-08-01 DIAGNOSIS — E782 Mixed hyperlipidemia: Secondary | ICD-10-CM | POA: Diagnosis present

## 2024-08-01 DIAGNOSIS — I25709 Atherosclerosis of coronary artery bypass graft(s), unspecified, with unspecified angina pectoris: Secondary | ICD-10-CM | POA: Diagnosis present

## 2024-08-01 DIAGNOSIS — I25118 Atherosclerotic heart disease of native coronary artery with other forms of angina pectoris: Secondary | ICD-10-CM | POA: Insufficient documentation

## 2024-08-01 DIAGNOSIS — Z9861 Coronary angioplasty status: Secondary | ICD-10-CM | POA: Insufficient documentation

## 2024-08-01 DIAGNOSIS — E119 Type 2 diabetes mellitus without complications: Secondary | ICD-10-CM | POA: Insufficient documentation

## 2024-08-01 DIAGNOSIS — I442 Atrioventricular block, complete: Secondary | ICD-10-CM | POA: Insufficient documentation

## 2024-08-01 DIAGNOSIS — Z952 Presence of prosthetic heart valve: Secondary | ICD-10-CM | POA: Insufficient documentation

## 2024-08-01 DIAGNOSIS — I2510 Atherosclerotic heart disease of native coronary artery without angina pectoris: Secondary | ICD-10-CM | POA: Diagnosis present

## 2024-08-01 DIAGNOSIS — I10 Essential (primary) hypertension: Secondary | ICD-10-CM | POA: Diagnosis present

## 2024-08-01 DIAGNOSIS — Z794 Long term (current) use of insulin: Secondary | ICD-10-CM | POA: Insufficient documentation

## 2024-08-01 DIAGNOSIS — Z79899 Other long term (current) drug therapy: Secondary | ICD-10-CM | POA: Insufficient documentation

## 2024-08-01 DIAGNOSIS — E785 Hyperlipidemia, unspecified: Secondary | ICD-10-CM | POA: Diagnosis present

## 2024-08-01 MED ORDER — RANOLAZINE ER 1000 MG PO TB12: 1000.0000 mg | ORAL_TABLET | Freq: Two times a day (BID) | ORAL | 3 refills | Status: AC

## 2024-08-01 MED ORDER — ISOSORBIDE MONONITRATE ER 60 MG PO TB24: 120.0000 mg | ORAL_TABLET | Freq: Every day | ORAL | 3 refills | Status: AC

## 2024-08-01 NOTE — Patient Instructions (Addendum)
 Medication Instructions:  Imdur  60 mg take two tablets (120 mg) daily   Increase Ranexa  1000 mg take one tablet twice daily *If you need a refill on your cardiac medications before your next appointment, please call your pharmacy*   Follow-Up: At [facility], you and your health needs are our priority.  As part of our continuing mission to provide you with exceptional heart care, our providers are all part of one team.  This team includes your primary Cardiologist (physician) and Advanced Practice Providers or APPs (Physician Assistants and Nurse Practitioners) who all work together to provide you with the care you need, when you need it.  Your next appointment:   3 month(s)  Provider:   Jon Hails, PA-C          We recommend signing up for the patient portal called MyChart.  Sign up information is provided on this After Visit Summary.  MyChart is used to connect with patients for Virtual Visits (Telemedicine).  Patients are able to view lab/test results, encounter notes, upcoming appointments, etc.  Non-urgent messages can be sent to your provider as well.   To learn more about what you can do with MyChart, go to [email or web address].   Other Instructions Referral sent to PharmD

## 2024-08-07 NOTE — Progress Notes (Signed)
 Chief Complaint: Type 2 DM  HPI: Mr.  [name] [name], 78 y.o. male, coming in for follow up of Type 2 Diabetes Mellitus. Past medical history of CAD s/p CABG in 2006, DM2, HLD, mod-severe AS followed by Cardiology who presents for follow up Type 2 DM.    LV 02/06/24  Reports doing well since last visit.  Had aortic valve replacement with pacemaker placement in December 2023. He had heart cath 07/02/24  Ozempic  ordered at prior visit but stopped due to cost and GI upset. Reported intolerance to Jardiance.  Mounjaro 2.5 mg weekly was ordered at prior visit.  He decided not to try.  Currently taking NPH 18 units twice daily and metformin  2000 mg daily.   A1c 7.5% today   Reports checking fingerstick blood glucose infrequently. Reports fasting blood glucose 150 mg/dL   Denies events of hypoglycemia.   PCP-[facility].   DM current regimen and BS control: Current meds-NPH 18 units twice daily and metformin  2000 mg daily  Glucometer- accu chek Frequency of BS monitoring-infrequently BS range-reports fasting blood glucose ~150 mg/dL  Adherence to regimen-good H/o Hypoglycemia-denies H/o hypoglycemic unawareness-denies  Diabetes history: Type- 2 Previous treatment regimens-Ozempic  (cost and GI upset), Jardiance Previous control-7.4% (11/2018) --> 6.9% (04/2019) --> 9.7 % (06/2020) --> 8.0 % (09/28/20) --> 7.9% (12/2020) --> 6.4% (04/2021)--> 7.4% (11/24/21) --> 6.7% (02/2022) 7.1% 06/2022--. 7.9% 01/2023--> 7.2% 10/24--> 7.3% 01/2024  H/o hospitalizations for hyper/hypoglycemia-denies Last CDE/ RD visit-  Complications: Kidney-denies chronic kidney disease  BP- BP Readings from Last 3 Encounters:  08/07/24 (!) 132/57  02/06/24 (!) 138/53  07/19/23 138/65   Eye-h/o cataracts, no retinopathy 2023  Neuropathy- denies Cardiac-h/o CAD s/p bypass x5, PAD. Denies CVA  Other-  Lifestyle:  Diet:  Exercise: walking  Social:married. Has children and grandchildren    Medications: New Medications Ordered This Visit  Medications  . metFORMIN  (GLUCOPHAGE ) 500 mg tablet    Sig: Take 2 tablets (1,000 mg total) 2 times daily with meals.    Dispense:  360 tablet    Refill:  2  . insulin  NPH U-100 (NovoLIN N FlexPen) 100 unit/mL (3 mL) pen    Sig: Inject 18 Units under the skin 2 (two) times a day.    Dispense:  45 mL    Refill:  2      Allergies: Allergies[1]  PMH: Medical History[2]  Family history: Family History[3]  Social history: Social History   Socioeconomic History  . Marital status: Married    Spouse name: Not on file  . Number of children: Not on file  . Years of education: Not on file  . Highest education level: Not on file  Occupational History  . Not on file  Tobacco Use  . Smoking status: Never  . Smokeless tobacco: Never  Substance and Sexual Activity  . Alcohol use: No  . Drug use: No  . Sexual activity: Not on file  Other Topics Concern  . 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  He lives with wife at      Social Drivers of Health   Food Insecurity: Low Risk  (02/06/2024)   Food vital sign   . Within the past 12 months, you worried that your food would run out before you got money to buy more: Never true   . Within the past 12 months, the food you bought just didn't last and you didn't have money to get  more: Never true  Transportation Needs: No Transportation Needs (02/06/2024)   Transportation   . In the past 12 months, has lack of reliable transportation kept you from medical appointments, meetings, work or from getting things needed for daily living? : No  Safety: Low Risk  (02/06/2024)   Safety   . How often does anyone, including family and friends, physically hurt you?: Never   . How often does anyone, including family and friends, insult or talk down to you?: Never   . How often does anyone, including family and friends, threaten you with harm?:  Never   . How often does anyone, including family and friends, scream or curse at you?: Never  Living Situation: Low Risk  (02/06/2024)   Living Situation   . What is your living situation today?: I have a steady place to live   . Think about the place you live. Do you have problems with any of the following? Choose all that apply:: Not on file    Review of Systems: As above  Physical Exam: Vitals:   08/07/24 1333  BP: (!) 132/57  Pulse: 70  SpO2: 98%   Weights (last 90 days)     Date/Time Weight   08/07/24 1333 94 kg (207 lb 4.8 oz)       Gen: Pleasant, well-appearing male, NAD HEENT: PERRL, EOM normal, no lid lag, no stare Oropharynx: Clear, moist, no oropharyngeal exudate Neck: No visible goiter CVS: HR 70 RS: Unlabored breathing pattern Neuro: No tremor Skin: Warm and dry Extremities: No edema Psych: Mood and affect normal   Data: Lab Results  Component Value Date   HGBA1C 6.7 (A) 03/11/2022   Lab Results  Component Value Date   CHOL 156 08/04/2021   TRIG 171 (H) 08/04/2021   HDL 46 (L) 08/04/2021   Lab Results  Component Value Date   CREATININE 1.03 08/04/2021   BUN 19 08/04/2021   NA 136 08/04/2021   K 4.0 08/04/2021   CL 104 08/04/2021   CO2 22 08/04/2021   No results found for: TSH No results found for: WBC, HGB, HCT, MCV, PLT Lab Results  Component Value Date   ALBUR 27 01/17/2023    Immunization History  Administered Date(s) Administered  . Influenza, split virus, trivalent, preservative 07/22/2010  . Pfizer SARS-CoV-2 Primary Series 12+ yrs 01/25/2020, 02/19/2020    Assessment: Type 2 DM  In summary, 78 y.o. 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  Goal A1c 7.5% or less with hypoglycemia avoidance.  He has tried Ozempic  in the past and reported GI upset and was too expensive.  Mounjaro ordered at prior visit, he decided not to try.  Had intolerance with Jardiance.  Will continue NPH 18 units twice daily and metformin  at 1000 mg twice daily.  Plan: Diabetes Mellitus -  A1c target <7% with hypoglycemia avoidance Urine albumin  ordered   Continue NPH 18 units twice daily  Continue metformin  1000 mg twice daily    Please call or message if BG consistently less than 70 or greater than 300 DM-related  therapies  ASA: 81 mg ACE/ARB: Benazepril   Statin: Crestor  Dilated eye exam: UTD Foot exam: denies wounds and soes  Flu Shot: annually  Albuminuria Screening: unable to void  HTN: Most recent BP is at goal <140/90 per ADA guidelines.  Follow-up: 6 months  Electronically signed by: Warren Gleen Batty, FNP 08/07/2024 1:57 PM         [1] Allergies Allergen Reactions  . Empagliflozin Other (See Comments) and GI Intolerance    Abdominal pain/dizziness  [2] Past Medical History: Diagnosis Date  . Arthritis   . Atherosclerosis of native arteries of the extremities with intermittent claudication 09/13/2008   Overview:  Annotation: minimal symptoms, right popliteal occlusion Qualifier: Diagnosis of  By: Wonda, MD, Ozell Lenis   Last Assessment & Plan:  Known severe right popliteal artery stenosis. Reserve angiography for progressive symptoms or severe lifestyle limitation because of unfavorable anatomy in the popliteal space.  . Coronary artery disease, occlusive 2007  . Diabetes mellitus    (CMD) 2015   HGBA1C on 05/2018 was 10  . Hearing loss   . Hypertension 1993  . Mixed hearing loss, bilateral  04/10/2012  . 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 Nuclear sclerosis of both eyes 06/10/2016  . Pseudophakia of left eye 08/08/2017   AMO Tecnis PCB00 20.5 diopters, laser assisted  . Right leg DVT    (CMD) 2007  [3] Family History Problem Relation Name Age of Onset  . Hypertension Mother

## 2024-08-29 ENCOUNTER — Telehealth: Payer: Self-pay | Admitting: Pharmacy Technician

## 2024-08-29 ENCOUNTER — Ambulatory Visit: Attending: Internal Medicine

## 2024-08-29 ENCOUNTER — Telehealth: Payer: Self-pay

## 2024-08-29 ENCOUNTER — Other Ambulatory Visit (HOSPITAL_COMMUNITY): Payer: Self-pay

## 2024-08-29 DIAGNOSIS — E7800 Pure hypercholesterolemia, unspecified: Secondary | ICD-10-CM | POA: Diagnosis present

## 2024-08-29 NOTE — Progress Notes (Signed)
 Patient ID: [name] [name]                 DOB: Nov 30, 1945                    MRN: [id number]      HPI: [name] [name] is a 78 y.o. male patient referred to lipid clinic by Jon Hails, PA. 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   Patient presents today in good spirits. He was recently seen by cardiology provider a month ago for f/u post hearth catheterization on 06/2024 that showed severe native three-vessel disease with known occlusion of the RCA and LCA. No intervention, medical therapy was recommended at this time. It was noted at that visit, most recent lipid panel (12/2023) reveals LDL of 85 and patient was referred to evaluate need for PCSK9i. Patient is currently taking ezetimibe  and rosuvastatin  for HLD management and reports tolerating well without any side effects.  Reviewed options for lowering LDL cholesterol including, PCSK-9 inhibitors, bempedoic acid and inclisiran.  Discussed mechanisms of action, dosing, side effects and potential decreases in LDL cholesterol.  Also reviewed cost information and potential options for patient assistance.   Current Medications: ezetimibe  10 mg daily, rosuvastatin  40 mg daily Intolerances: none Risk Factors: CAD s/p CABG in 2009, T2DM, PAD, HTN LDL goal: < 55; Trig goal: <150 Lipid panel (12/2023): Chol 170, Trig 301, HDL 36, LDL 85 Lpa (12/2023): 115.6  Diet: Patient says that he eats what he wants most of the time Breakfast: waffles and syrup Lunch/Dinner: ham sandwich, every Tuesday he eats a big meal with vegetables and some baked protein. 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 He has walked 1800 steps today.    Family History:  Relation Problem Comments  Mother  (Deceased) Heart disease Pt said that he is unsure cause of his mother's death    Father (Deceased)   Maternal Grandmother (Deceased)   Maternal Grandfather (Deceased)   Paternal Grandmother (Deceased)   Paternal Grandfather (Deceased)   Neg Hx Diabetes      Social History:  Alcohol: none  Smoking: none   Labs:  Lipid Panel     Component Value Date/Time   CHOL 127 11/09/2022 0000   TRIG 163 (H) 11/09/2022 0000   HDL 35 (L) 11/09/2022 0000   CHOLHDL 3.6 11/09/2022 0000   CHOLHDL 2.5 01/14/2021 0105   VLDL 26 01/14/2021 0105   LDLCALC 64 11/09/2022 0000   LABVLDL 28 11/09/2022 0000    Past Medical History:  Diagnosis Date   Carotid artery occlusion    Coronary atherosclerosis of artery bypass graft    CABG 2009, LHC 07/2016 restenosis of SVG to OM treated with DES   Diabetes mellitus (HCC)    Essential hypertension, benign    Fatty liver    History of kidney stones    HTN (hypertension)    PAD (peripheral artery disease)    Pure hypercholesterolemia    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 Wonda & Dr. Lucas   Severe aortic stenosis     Current Outpatient Medications on File  Prior to Visit  Medication Sig Dispense Refill   amLODipine  (NORVASC ) 10 MG tablet TAKE 1 TABLET BY MOUTH EVERY DAY 90 tablet 3   amoxicillin  (AMOXIL ) 500 MG tablet Take 4 tablets by mouth 1 hour prior to dental procedures and cleanings. 12 tablet 6   aspirin  81 MG EC tablet TAKE 1 TABLET BY MOUTH EVERY DAY 90 tablet 3   benazepril  (LOTENSIN ) 40 MG tablet Take 40 mg by mouth daily.      clopidogrel  (PLAVIX ) 75 MG tablet TAKE 1 TABLET BY MOUTH EVERY DAY 90 tablet 3   ezetimibe  (ZETIA ) 10 MG tablet Take 1 tablet (10 mg total) by mouth daily. 90 tablet 3   finasteride  (PROSCAR ) 5 MG tablet Take 5 mg by mouth daily.     glucose blood (PRECISION QID TEST) test strip 1 each by Other route as directed.      hydrochlorothiazide  (HYDRODIURIL ) 25 MG tablet TAKE 1 TABLET (25 MG TOTAL) BY MOUTH DAILY. 90 tablet 2   Insulin  Pen Needle (B-D UF III MINI PEN NEEDLES) 31G X 5 MM MISC Use to inject Ozempic  once a week     isosorbide  mononitrate (IMDUR ) 60 MG 24 hr tablet Take 2 tablets (120 mg total) by mouth daily. 180 tablet 3   metFORMIN  (GLUCOPHAGE ) 500 MG tablet Take 1,000 mg by mouth 2 (two) times daily with a meal.     metoprolol  tartrate (LOPRESSOR ) 50 MG tablet TAKE 1 TABLET BY MOUTH TWICE A DAY 180 tablet 3   Multiple Vitamin (MULTIVITAMIN WITH MINERALS) TABS tablet Take 1 tablet by mouth daily.     nitroGLYCERIN  (NITROSTAT ) 0.4 MG SL tablet DISSOLVE 1 TAB UNDER THE TONGUE EVERY 5 MINUTES AS NEEDED FOR CHEST PAIN. MAX OF 3 DOSES, THEN 911. 75 tablet 3   NOVOLIN N FLEXPEN 100 UNIT/ML FlexPen Inject 18 Units into the skin 2 (two) times daily.     omeprazole (PRILOSEC) 20 MG capsule Take 20 mg by mouth every morning.     ranolazine  (RANEXA ) 1000 MG SR tablet Take 1 tablet (1,000 mg total) by mouth 2 (two) times daily. 180 tablet 3   rosuvastatin  (CRESTOR ) 40 MG tablet Take 1 tablet (40 mg total) by mouth daily. 90 tablet 3   No current facility-administered medications on file prior to visit.    Allergies  Allergen Reactions   Empagliflozin Other (See Comments)    Abdominal pain/dizziness Jardiance    Assessment/Plan:  1. Hyperlipidemia -  Problem  Hld (Hyperlipidemia)   Qualifier: Diagnosis of  By: Wonda, MD, Ozell Lenis     HLD (hyperlipidemia) Assessment:  LDL goal: < 55 mg/dl; last LDLc 85 mg/dl (03/7973) Trig goal: < 849; last Triglycerides 301 mg/dl (03/7973) Elevated Lpa (12/2023): 115.6  Patient is unsure if he was fasting when he obtained most recent lipid panel  Tolerates ezetimibe  and rosuvastatin  well without any side effects   Reviewed the significance of reaching LDL target levels to reduce the risk of cardiovascular events; also addressed significance of elevated  lipoprotein(a) Discussed heart-healthy dietary strategies and provided educational handouts to support lifestyle modifications Discussed next potential options (PCSK-9 inhibitors, bempedoic acid and inclisiran); cost, dosing efficacy, side effects  Patient willing to proceed with PCSK9i  Plan: Continue taking ezetimibe  10 mg daily and rosuvastatin  40 mg daily  Will apply for PA for PCSK9i; will inform patient upon approval  Lipid lab due in 3 months after starting PCSK9i; Labs already ordered - instructed patient to fast for these labs   Thank  you,  Koleson Reifsteck E. Deborra Phegley, Pharm.D [facility]. [facility] [address], [place], [place] [address] Phone: [phone]; Fax: [phone]

## 2024-08-29 NOTE — Telephone Encounter (Signed)
    Pharmacy Patient Advocate Encounter   Received notification from Pt Calls Messages that prior authorization for REPATHA is required/requested.   Insurance verification completed.   The patient is insured through [facility].   Per test claim: PA required; PA submitted to above mentioned insurance via Latent Key/confirmation #/EOC [id number] Status is pending

## 2024-08-29 NOTE — Patient Instructions (Addendum)
 Your Results:             Your most recent labs Goal  Total Cholesterol 170 < 200  Triglycerides 301 < 150  HDL (happy/good cholesterol) 36 > 40  LDL (lousy/bad cholesterol 85 < 55   Medication changes: Continue ezetimibe  10 mg and rosuvastatin  40 mg daily 2.  We will start the process to get PCSK9i covered by your insurance.  Once      the prior authorization is complete, we will call you to let you know and      confirm pharmacy information.   3. We want to repeat labs after 3 months after PCSK9i (Repatha).     Lennix Kneisel E. Haroon Shatto, Pharm.D [facility]. [facility] [address], [place], [place] [address] Phone: [phone]; Fax: [phone]     Repatha is a cholesterol medication that improved your body's ability to get rid of bad cholesterol known as LDL. It can lower your LDL up to 60%! It is an injection that is given under the skin every 2 weeks. The medication often requires a prior authorization from your insurance company. We will take care of submitting all the necessary information to your insurance company to get it approved. The most common side effects of Repatha include runny nose, symptoms of the common cold, rarely flu or flu-like symptoms, back/muscle pain in about 3-4% of the patients, and redness, pain, or bruising at the injection site.

## 2024-08-29 NOTE — Telephone Encounter (Signed)
 Pharmacy Patient Advocate Encounter  Received notification from [facility] that Prior Authorization for REPATHA has been APPROVED from 08/29/24 to 08/29/25. Ran test claim, Copay is $109.62. This test claim was processed through [facility]- copay amounts may vary at other pharmacies due to pharmacy/plan contracts, or as the patient moves through the different stages of their insurance plan.   PA #/Case ID/Reference #: [id number]    He has a coinsurance and that is the coinsurance price

## 2024-08-29 NOTE — Assessment & Plan Note (Addendum)
 Assessment:  LDL goal: < 55 mg/dl; last LDLc 85 mg/dl (03/7973) Trig goal: < 849; last Triglycerides 301 mg/dl (03/7973) Elevated Lpa (12/2023): 115.6  Patient is unsure if he was fasting when he obtained most recent lipid panel  Tolerates ezetimibe  and rosuvastatin  well without any side effects   Reviewed the significance of reaching LDL target levels to reduce the risk of cardiovascular events; also addressed significance of elevated lipoprotein(a) Discussed heart-healthy dietary strategies and provided educational handouts to support lifestyle modifications Discussed next potential options (PCSK-9 inhibitors, bempedoic acid and inclisiran); cost, dosing efficacy, side effects  Patient willing to proceed with PCSK9i  Plan: Continue taking ezetimibe  10 mg daily and rosuvastatin  40 mg daily  Will apply for PA for PCSK9i; will inform patient upon approval  Lipid lab due in 3 months after starting PCSK9i; Labs already ordered - instructed patient to fast for these labs

## 2024-08-30 NOTE — Telephone Encounter (Signed)
 Please see other encounter.

## 2024-09-18 ENCOUNTER — Other Ambulatory Visit: Payer: Self-pay | Admitting: Cardiovascular Disease

## 2024-10-03 ENCOUNTER — Ambulatory Visit: Payer: Medicare Other

## 2024-10-03 DIAGNOSIS — I442 Atrioventricular block, complete: Secondary | ICD-10-CM

## 2024-10-04 LAB — CUP PACEART REMOTE DEVICE CHECK
Battery Remaining Longevity: 132 mo
Battery Voltage: 3.03 V
Brady Statistic AP VP Percent: 29.71 %
Brady Statistic AP VS Percent: 0 %
Brady Statistic AS VP Percent: 69.41 %
Brady Statistic AS VS Percent: 0.87 %
Brady Statistic RA Percent Paced: 30.15 %
Brady Statistic RV Percent Paced: 99.13 %
Date Time Interrogation Session: 20251218015643
Implantable Lead Connection Status: 753985
Implantable Lead Connection Status: 753985
Implantable Lead Implant Date: 20231220
Implantable Lead Implant Date: 20231220
Implantable Lead Location: 753859
Implantable Lead Location: 753860
Implantable Lead Model: 3830
Implantable Lead Model: 5076
Implantable Pulse Generator Implant Date: 20231220
Lead Channel Impedance Value: 304 Ohm
Lead Channel Impedance Value: 361 Ohm
Lead Channel Impedance Value: 418 Ohm
Lead Channel Impedance Value: 608 Ohm
Lead Channel Pacing Threshold Amplitude: 0.875 V
Lead Channel Pacing Threshold Amplitude: 0.875 V
Lead Channel Pacing Threshold Pulse Width: 0.4 ms
Lead Channel Pacing Threshold Pulse Width: 0.4 ms
Lead Channel Sensing Intrinsic Amplitude: 12.125 mV
Lead Channel Sensing Intrinsic Amplitude: 12.125 mV
Lead Channel Sensing Intrinsic Amplitude: 3 mV
Lead Channel Sensing Intrinsic Amplitude: 3 mV
Lead Channel Setting Pacing Amplitude: 1.5 V
Lead Channel Setting Pacing Amplitude: 2 V
Lead Channel Setting Pacing Pulse Width: 0.4 ms
Lead Channel Setting Sensing Sensitivity: 1.2 mV
Zone Setting Status: 755011
Zone Setting Status: 755011

## 2024-10-06 NOTE — Progress Notes (Signed)
 Remote PPM Transmission

## 2024-10-11 ENCOUNTER — Ambulatory Visit: Payer: Self-pay | Admitting: Cardiology

## 2024-11-04 ENCOUNTER — Encounter: Payer: Self-pay | Admitting: Cardiovascular Disease

## 2024-11-04 ENCOUNTER — Ambulatory Visit: Attending: Cardiovascular Disease | Admitting: Cardiovascular Disease

## 2024-11-04 VITALS — BP 130/64 | HR 70 | Ht 69.0 in | Wt 202.6 lb

## 2024-11-04 DIAGNOSIS — I70213 Atherosclerosis of native arteries of extremities with intermittent claudication, bilateral legs: Secondary | ICD-10-CM | POA: Insufficient documentation

## 2024-11-04 DIAGNOSIS — Z952 Presence of prosthetic heart valve: Secondary | ICD-10-CM | POA: Diagnosis present

## 2024-11-04 DIAGNOSIS — I10 Essential (primary) hypertension: Secondary | ICD-10-CM | POA: Diagnosis not present

## 2024-11-04 DIAGNOSIS — I25118 Atherosclerotic heart disease of native coronary artery with other forms of angina pectoris: Secondary | ICD-10-CM | POA: Insufficient documentation

## 2024-11-04 DIAGNOSIS — E782 Mixed hyperlipidemia: Secondary | ICD-10-CM | POA: Insufficient documentation

## 2024-11-04 NOTE — Assessment & Plan Note (Addendum)
 The patient has normal function of his TAVR prosthesis.  He has no audible murmur on exam.  I would like to see him back in 1 year with an echocardiogram prior to his return visit.

## 2024-11-04 NOTE — Progress Notes (Signed)
 " Cardiology Office Note:    Date:  11/04/2024   ID:  [name] [name], DOB May 29, 1946, MRN [id number]  PCP:  Katina Pfeiffer, PA-C   [facility] Providers Cardiologist:  [name], MD Cardiology APP:  Madie Jon Garre, [place]  Electrophysiologist:  Will Gladis Norton, MD     Referring MD: Katina Pfeiffer, PA-C   Chief Complaint  Patient presents with   Coronary Artery Disease    History of Present Illness:    [name] [name] is a 79 y.o. male with a hx of coronary artery disease status post CABG, peripheral arterial disease status post femoral endarterectomy, and severe aortic stenosis status post TAVR. The patient also has a history of complete heart block and underwent permanent pacemaker implantation following TAVR in 2023. Comorbid conditions include diabetes, hypertension, and hyperlipidemia.   The patient is here alone today.  He is doing fine.  He reports no change in clinical symptoms.  He continues to have mild exertional angina with walking or other physical activities.  He denies any progressive symptoms over recent months.  He underwent cardiac catheterization in September 2025 which demonstrated continued patency of the LIMA to LAD graft, continued patency of the saphenous vein graft to diagonal, continued patency of the saphenous vein graft to distal RCA with severe in-stent restenosis of the PDA stent and severe diffuse PDA stenosis, and interval occlusion of the SVG to obtuse marginal which has been stented and reintervened upon many times.  Ongoing medical therapy was recommended.  The patient is not having any claudication symptoms at present.  He denies dyspnea, orthopnea, or PND.  Current Medications: Active Medications[1]   Allergies:   Empagliflozin   ROS:   Please see the history of present illness.    All other systems reviewed and are negative.  EKGs/Labs/Other Studies Reviewed:    The following studies were reviewed today: Cardiac Studies &  Procedures   ______________________________________________________________________________________________ CARDIAC CATHETERIZATION  CARDIAC CATHETERIZATION 07/02/2024  Conclusion Severe native three vessel CAD with know occlusion of the RCA and LCA Continued patency of the LIMA-LAD Continued patency of the SVG-diagonal Continued patency of the SVG-distal RCA with severe ISR in the PDA stent and severe diffuse PDA stenosis Interval occlusion of the SVG-OM (previous site of multiple stents and PCI's)  Diffuse distal vessel CAD. Recommend ongoing medical therapy.  Findings Coronary Findings Diagnostic  Dominance: Right  Left Anterior Descending Vessel is large. Small diffusely disease distal LAD without focal stenosis Ost LAD to Prox LAD lesion is 100% stenosed. The lesion is chronically occluded. Dist LAD lesion is 70% stenosed.  Left Circumflex Ost Cx to Prox Cx lesion is 99% stenosed.  Second Obtuse Marginal Branch Vessel is moderate in size.  Right Coronary Artery Prox RCA to Mid RCA lesion is 100% stenosed. Dist RCA lesion is 40% stenosed.  Right Posterior Descending Artery Ost RPDA to RPDA lesion is 80% stenosed. The lesion is moderately calcified. The lesion was previously treated . RPDA lesion is 75% stenosed.  Inferior Septal The PDA branch is diffusely diseased throughout the mid-segments of the vessel  Saphenous Graft To Dist RCA SVG and is normal in caliber.  Saphenous Graft To 2nd Mrg SVG and is normal in caliber. Prox Graft lesion is 100% stenosed. Dist Graft to Insertion lesion is 10% stenosed. The lesion was previously treated using a drug eluting stent over 2 years ago.  Saphenous Graft To 1st Diag SVG and is normal in caliber.  LIMA LIMA Graft To Dist  LAD LIMA and is normal in caliber.  Intervention  No interventions have been documented.   CARDIAC CATHETERIZATION  CARDIAC CATHETERIZATION 04/01/2022  Conclusion   Ost LAD to Prox LAD  lesion is 100% stenosed.  LIMA to LAD is patent.   Dist LAD lesion is 70% stenosed.  Diffuse disease noted in the native LAD.   Prox RCA to Mid RCA lesion is 100% stenosed.  SVG to PDA is patent.   Dist RCA lesion is 40% stenosed.   Ost RPDA to RPDA lesion is 40% stenosed.   Ost Cx to Prox Cx lesion is 99% stenosed.   SVG to OM is patent.  There is a Dist Graft to Insertion lesion is 80% stenosed.   Scoring balloon angioplasty was performed using a 1.75 mm score flex, followed by a BALL SAPPHIRE NC24 2.50X10.   Post intervention, there is a 10% residual stenosis.   There is moderate aortic valve stenosis.  Mean gradient 37 mmHg  Continue aggressive secondary prevention.  Dual antiplatelet therapy going forward.  Area of disease was distal in the graft and there is difficulty in delivering equipment as well.  I do not think an additional stent would be beneficial.  Could consider drug-eluting balloon if available in the event that he has further restenosis.  Findings Coronary Findings Diagnostic  Dominance: Right  Left Anterior Descending Vessel is large. Ost LAD to Prox LAD lesion is 100% stenosed. The lesion is chronically occluded. Dist LAD lesion is 70% stenosed.  Left Circumflex Ost Cx to Prox Cx lesion is 99% stenosed.  Second Obtuse Marginal Branch Vessel is moderate in size.  Right Coronary Artery Prox RCA to Mid RCA lesion is 100% stenosed. Dist RCA lesion is 40% stenosed.  Right Posterior Descending Artery Ost RPDA to RPDA lesion is 40% stenosed. The lesion is moderately calcified. The lesion was previously treated .  Saphenous Graft To Dist RCA SVG and is normal in caliber.  Saphenous Graft To 2nd Mrg SVG and is normal in caliber. Dist Graft to Insertion lesion is 80% stenosed. The lesion was previously treated using a drug eluting stent over 2 years ago.  Saphenous Graft To 1st Diag SVG and is normal in caliber.  LIMA LIMA Graft To Dist LAD LIMA and is  normal in caliber.  Intervention  Dist Graft to Insertion lesion (Saphenous Graft To 2nd Mrg) Angioplasty CATHETER LAUNCHER 6FR AL1 guide catheter was inserted. WIRE ASAHI PROWATER 180CM guidewire used to cross lesion. Scoring balloon angioplasty was performed using a BALL SAPPHIRE NC24 2.50X10. Initially, a 2.0 score flex balloon was advanced but would not cross.  We then used a 2.0 semicompliant balloon to predilate.  The 2.0 score flex still would not cross.  We then placed a second wire for support which was the grand slam wire.  We then used a 1.75 score flex balloon to pretreat.  We then used a 2.5 Fernando Salinas balloon after multiple inflations with a score flex had been done.  There was significantly improved angiographic result. Areas to distal and the graft and there is difficulty in delivering equipment as well.  I do not think an additional stent would be beneficial.  Could consider drug-eluting balloon if available in the event that he has further restenosis. Post-Intervention Lesion Assessment The intervention was successful. Pre-interventional TIMI flow is 3. Post-intervention TIMI flow is 3. No complications occurred at this lesion. There is a 10% residual stenosis post intervention.   STRESS TESTS  NM PET CT CARDIAC PERFUSION  MULTI W/ABSOLUTE BLOODFLOW 06/11/2024  Narrative   Findings are consistent with inferolateral ischemia. The study is high risk given drop in EF with stress, TID, and large reversible perfusion defect. Consider cardiac catheterization.   LV perfusion is abnormal. There is evidence of ischemia. Defect 1: There is a large defect with moderate reduction in uptake present in the apical to basal inferior and lateral location(s) that is reversible. There is abnormal wall motion in the defect area. Consistent with ischemia. Defect 2: There is a medium defect with moderate reduction in uptake present in the mid to basal anterior and anteroseptal location(s) that is partially  reversible. There is abnormal wall motion in the defect area. Consistent with infarction and peri-infarct ischemia.   Rest left ventricular function is abnormal. Rest global function is mildly reduced. Rest EF: 47%. Stress left ventricular function is abnormal. Stress global function is moderately reduced. Stress EF: 39%. End diastolic cavity size is normal. End systolic cavity size is mildly enlarged. Evidence of transient ischemic dilation (TID) noted.   Myocardial blood flow was computed to be 0.94ml/g/min at rest and 1.60ml/g/min at stress. Global myocardial blood flow reserve was 1.70 and was mildly abnormal. In the setting of prior CABG, MBFR quantitation is challenging to interpret when abnormal.   Coronary calcium  assessment not performed due to prior revascularization.   Electronically signed by: Soyla DELENA Merck, MD  CLINICAL DATA:  This over-read does not include interpretation of cardiac or coronary anatomy or pathology. No interpretation the PET data set. The cardiac PET-CT interpretation by the cardiologist is attached.  COMPARISON:  None Available.  FINDINGS: Limited view of the lung parenchyma demonstrates no suspicious nodularity. Airways are normal.  Limited view of the mediastinum demonstrates no adenopathy. Esophagus normal.  Limited view of the upper abdomen unremarkable.  Limited view of the skeleton and chest wall is unremarkable.  IMPRESSION: No significant extracardiac findings.   Electronically Signed By: Jackquline Boxer M.D. On: 06/11/2024 08:45   ECHOCARDIOGRAM  ECHOCARDIOGRAM COMPLETE 09/20/2023  Narrative ECHOCARDIOGRAM REPORT    Patient Name:   NORMA IGNASIAK Date of Exam: 09/20/2023 Medical Rec #:  [id number]     Height:       69.0 in Accession #:    [id number]    Weight:       208.0 lb Date of Birth:  1946/10/16     BSA:          2.101 m Patient Age:    77 years      BP:           120/68 mmHg Patient Gender: M             HR:           60  bpm. Exam Location:  [address]  Procedure: 2D Echo, Color Doppler, Cardiac Doppler and 3D Echo  Indications:    S/P Aortic Valve replacement Z95.2  History:        Patient has prior history of Echocardiogram examinations, most recent 11/09/2022. Prior CABG, PAD; Risk Factors:Hypertension and Diabetes. Aortic Valve: 26 mm Edwards Sapien prosthetic, stented (TAVR) valve is present in the aortic position. Procedure Date: 10/04/2022.  Sonographer:    Augustin Seals RDCS Referring Phys: [phone] JILL D MCDANIEL  IMPRESSIONS   1. Left ventricular ejection fraction, by estimation, is 55 to 60%. The left ventricle has normal function. The left ventricle has no regional wall motion abnormalities. There is mild left ventricular hypertrophy. Left ventricular diastolic parameters are consistent  with Grade I diastolic dysfunction (impaired relaxation). 2. Right ventricular systolic function is normal. The right ventricular size is normal. Tricuspid regurgitation signal is inadequate for assessing PA pressure. 3. Trivial mitral valve regurgitation. 4. S/p 26 mm Edwards Sapien TAVR. V max 1.65 m/s. No PVL. mean gradient 6 mmHg.SABRA Aortic valve regurgitation is not visualized. There is a 26 mm Edwards Sapien prosthetic (TAVR) valve present in the aortic position. Procedure Date: 10/04/2022. Echo findings are consistent with normal structure and function of the aortic valve prosthesis. 5. The inferior vena cava is normal in size with greater than 50% respiratory variability, suggesting right atrial pressure of 3 mmHg.  Comparison(s): No significant change from prior study.  FINDINGS Left Ventricle: Left ventricular ejection fraction, by estimation, is 55 to 60%. The left ventricle has normal function. The left ventricle has no regional wall motion abnormalities. The left ventricular internal cavity size was normal in size. There is mild left ventricular hypertrophy. Abnormal (paradoxical) septal  motion consistent with post-operative status. Left ventricular diastolic parameters are consistent with Grade I diastolic dysfunction (impaired relaxation).  Right Ventricle: The right ventricular size is normal. Right ventricular systolic function is normal. Tricuspid regurgitation signal is inadequate for assessing PA pressure.  Left Atrium: Left atrial size was normal in size.  Right Atrium: Right atrial size was normal in size.  Pericardium: Trivial pericardial effusion is present.  Mitral Valve: There is mild thickening of the mitral valve leaflet(s). There is mild calcification of the mitral valve leaflet(s). Trivial mitral valve regurgitation.  Tricuspid Valve: Tricuspid valve regurgitation is not demonstrated.  Aortic Valve: S/p 26 mm Edwards Sapien TAVR. V max 1.65 m/s. No PVL. mean gradient 6 mmHg. Aortic valve regurgitation is not visualized. Aortic valve mean gradient measures 6.0 mmHg. Aortic valve peak gradient measures 11.0 mmHg. Aortic valve area, by VTI measures 1.95 cm. There is a 26 mm Edwards Sapien prosthetic, stented (TAVR) valve present in the aortic position. Procedure Date: 10/04/2022. Echo findings are consistent with normal structure and function of the aortic valve prosthesis.  Pulmonic Valve: Pulmonic valve regurgitation is not visualized.  Aorta: The aortic root and ascending aorta are structurally normal, with no evidence of dilitation.  Venous: The inferior vena cava is normal in size with greater than 50% respiratory variability, suggesting right atrial pressure of 3 mmHg.  IAS/Shunts: No atrial level shunt detected by color flow Doppler.   LEFT VENTRICLE PLAX 2D LVIDd:         5.00 cm   Diastology LVIDs:         3.50 cm   LV e' medial:    5.77 cm/s LV PW:         1.00 cm   LV E/e' medial:  14.9 LV IVS:        1.00 cm   LV e' lateral:   7.62 cm/s LVOT diam:     2.10 cm   LV E/e' lateral: 11.3 LV SV:         69 LV SV Index:   33 LVOT Area:     3.46  cm  3D Volume EF: 3D EF:        60 % LV EDV:       138 ml LV ESV:       55 ml LV SV:        83 ml  RIGHT VENTRICLE RV Basal diam:  3.80 cm RV Mid diam:    3.70 cm RV S prime:  14.00 cm/s TAPSE (M-mode): 2.9 cm  LEFT ATRIUM           Index        RIGHT ATRIUM           Index LA diam:      5.10 cm 2.43 cm/m   RA Area:     11.50 cm LA Vol (A2C): 27.9 ml 13.28 ml/m  RA Volume:   25.40 ml  12.09 ml/m LA Vol (A4C): 38.2 ml 18.18 ml/m AORTIC VALVE AV Area (Vmax):    1.93 cm AV Area (Vmean):   1.76 cm AV Area (VTI):     1.95 cm AV Vmax:           165.50 cm/s AV Vmean:          113.500 cm/s AV VTI:            0.352 m AV Peak Grad:      11.0 mmHg AV Mean Grad:      6.0 mmHg LVOT Vmax:         92.20 cm/s LVOT Vmean:        57.600 cm/s LVOT VTI:          0.198 m LVOT/AV VTI ratio: 0.56  AORTA Ao Root diam: 3.60 cm Ao Asc diam:  3.70 cm  MITRAL VALVE MV Area (PHT): 2.62 cm     SHUNTS MV Decel Time: 289 msec     Systemic VTI:  0.20 m MV E velocity: 85.90 cm/s   Systemic Diam: 2.10 cm MV A velocity: 109.00 cm/s MV E/A ratio:  0.79  [place] signed by Ronal Ross Signature Date/Time: 09/20/2023/10:16:45 AM    Final      CT SCANS  CT CORONARY MORPH W/CTA COR W/SCORE 08/26/2022  Addendum 08/27/2022  3:59 PM ADDENDUM REPORT: 08/27/2022 15:56  CLINICAL DATA:  19F with severe aortic stenosis being evaluated for a TAVR procedure.  EXAM: Cardiac TAVR CT  TECHNIQUE: The patient was scanned on a [employer]. A 120 kV retrospective scan was triggered in the descending thoracic aorta at 111 HU's. Gantry rotation speed was 250 msecs and collimation was .6 mm. No beta blockade or nitro were given. The 3D data set was reconstructed in 5% intervals of the R-R cycle. Systolic and diastolic phases were analyzed on a dedicated work station using MPR, MIP and VRT modes. The patient received 100 cc of contrast.  FINDINGS: Aortic  Root:  Aortic valve: Trileaflet  Aortic valve calcium  score: 2370  Aortic annulus:  Diameter: 28mm x 23mm  Perimeter: 78mm  Area: 467 mm^2  Calcifications: No calcifications  Coronary height: Min Left - 12mm, Min Right - 17mm  Sinotubular height: Left cusp - 22mm; Right cusp - 24mm; Noncoronary cusp - 26mm  LVOT (as measured 3 mm below the annulus):  Diameter: 29mm x 22mm  Area: 487 mm^2  Calcifications: No calcifications  Aortic sinus width: Left cusp - 34mm; Right cusp - 32mm; Noncoronary cusp - 35mm  Sinotubular junction width: 29mm x 28mm  Optimum Fluoroscopic Angle for Delivery: LAO 11 CRA 10  Cardiac:  Right atrium: Normal size  Right ventricle: Normal size  Pulmonary arteries: Normal size  Pulmonary veins: Normal configuration  Left atrium: Mild enlargement  Left ventricle: Normal size  Pericardium: Normal thickness  Coronary arteries: S/p CABG with patent LIMA-LAD, SVG-diagonal, SVG-OM, and SVG-PDA  IMPRESSION: 1. Trileaflet aortic valve with severe calcifications (AV calcium  score 2370)  2. Aortic annulus measures 28mm x 23mm  in diameter with perimeter 78mm and area 467 mm^2. No annular or LVOT calcifications. Annular measurements are suitable for delivery of a 26mm Edwards Sapien 3 valve  3. Sufficient coronary to annulus distance, measuring 12mm to left main and 17mm to RCA  4. Optimum Fluoroscopic Angle for Delivery:  LAO 11 CRA 10  5. S/p CABG with patent LIMA-LAD, SVG-diagonal, SVG-OM, and SVG-PDA   Electronically Signed By: Lonni Nanas M.D. On: 08/27/2022 15:56  Narrative EXAM: OVER-READ INTERPRETATION  CT CHEST  The following report is a limited chest CT over-read performed by radiologist Dr. Selinda Blue of [facility] on 08/26/2022. This over-read does not include interpretation of cardiac or coronary anatomy or pathology. The cardiac CTA interpretation by the cardiologist is  attached.  COMPARISON:  None Available.  FINDINGS: Please see the separate concurrent chest CT angiogram report for details.  IMPRESSION: Please see the separate concurrent chest CT angiogram report for details.  Electronically Signed: By: Selinda DELENA Blue M.D. On: 08/26/2022 11:10     ______________________________________________________________________________________________      EKG:        Recent Labs: 06/26/2024: BUN 14; Creatinine, Ser 1.16; Hemoglobin 12.9; Platelets 273; Potassium 5.0; Sodium 140  Recent Lipid Panel    Component Value Date/Time   CHOL 127 11/09/2022 0000   TRIG 163 (H) 11/09/2022 0000   HDL 35 (L) 11/09/2022 0000   CHOLHDL 3.6 11/09/2022 0000   CHOLHDL 2.5 01/14/2021 0105   VLDL 26 01/14/2021 0105   LDLCALC 64 11/09/2022 0000     Risk Assessment/Calculations:                Physical Exam:    VS:  BP 130/64 (BP Location: Right Arm)   Pulse 70   Ht 5' 9 (1.753 m)   Wt 202 lb 9.6 oz (91.9 kg)   SpO2 97%   BMI 29.92 kg/m     Wt Readings from Last 3 Encounters:  11/04/24 202 lb 9.6 oz (91.9 kg)  08/01/24 204 lb (92.5 kg)  07/02/24 208 lb (94.3 kg)     GEN:  Well nourished, well developed in no acute distress HEENT: Normal NECK: No JVD; No carotid bruits LYMPHATICS: No lymphadenopathy CARDIAC: RRR, no murmurs, rubs, gallops RESPIRATORY:  Clear to auscultation without rales, wheezing or rhonchi  ABDOMEN: Soft, non-tender, non-distended MUSCULOSKELETAL:  No edema; No deformity  SKIN: Warm and dry NEUROLOGIC:  Alert and oriented x 3 PSYCHIATRIC:  Normal affect   Assessment & Plan Mixed hyperlipidemia Treated with ezetimibe  and rosuvastatin  40 mg daily.  Lipids have been at goal.  Continue current management.  LDL goal less than 70 mg/dL. Coronary artery disease of native artery of native heart with stable angina pectoris The patient continues on aspirin  and clopidogrel  with extensive CAD including degenerated bypass graft  disease.  He is appropriately treated with an ACE inhibitor in his antianginal therapy includes ranolazine , isosorbide , and metoprolol  tartrate. Essential hypertension Blood pressure well-controlled on multidrug therapy.  Reviewed with the patient today. S/P TAVR (transcatheter aortic valve replacement) The patient has normal function of his TAVR prosthesis.  He has no audible murmur on exam.  I would like to see him back in 1 year with an echocardiogram prior to his return visit. Atherosclerosis of native artery of both lower extremities with intermittent claudication He has done really well with lower extremity revascularization and he has no claudication symptoms at present.  Patient is appropriately treated with DAPT using aspirin  and clopidogrel  as well as  high intensity statin.      Medication Adjustments/Labs and Tests Ordered: Current medicines are reviewed at length with the patient today.  Concerns regarding medicines are outlined above.  Orders Placed This Encounter  Procedures   ECHOCARDIOGRAM COMPLETE   No orders of the defined types were placed in this encounter.   Patient Instructions  Medication Instructions:  No medication changes were made at this visit. Continue current regimen.   *If you need a refill on your cardiac medications before your next appointment, please call your pharmacy*  Lab Work: None ordered today. If you have labs (blood work) drawn today and your tests are completely normal, you will receive your results only by: MyChart Message (if you have MyChart) OR A paper copy in the mail If you have any lab test that is abnormal or we need to change your treatment, we will call you to review the results.  Testing/Procedures: Your physician has requested that you have an echocardiogram prior to 1 year follow-up with Dr. Wonda. Echocardiography is a painless test that uses sound waves to create images of your heart. It provides your doctor with information  about the size and shape of your heart and how well your hearts chambers and valves are working. This procedure takes approximately one hour. There are no restrictions for this procedure. Please do NOT wear cologne, perfume, aftershave, or lotions (deodorant is allowed). Please arrive 15 minutes prior to your appointment time.  Please note: We ask at that you not bring children with you during ultrasound (echo/ vascular) testing. Due to room size and safety concerns, children are not allowed in the ultrasound rooms during exams. Our front office staff cannot provide observation of children in our lobby area while testing is being conducted. An adult accompanying a patient to their appointment will only be allowed in the ultrasound room at the discretion of the ultrasound technician under special circumstances. We apologize for any inconvenience.   Follow-Up: At [facility], you and your health needs are our priority.  As part of our continuing mission to provide you with exceptional heart care, our providers are all part of one team.  This team includes your primary Cardiologist (physician) and Advanced Practice Providers or APPs (Physician Assistants and Nurse Practitioners) who all work together to provide you with the care you need, when you need it.  Your next appointment:   1 year(s)  Provider:   Ozell Wonda, MD     Signed, Ozell Wonda, MD  11/04/2024 4:14 PM    [facility]     [1]  Current Meds  Medication Sig   amLODipine  (NORVASC ) 10 MG tablet TAKE 1 TABLET BY MOUTH EVERY DAY   amoxicillin  (AMOXIL ) 500 MG tablet Take 4 tablets by mouth 1 hour prior to dental procedures and cleanings.   aspirin  81 MG EC tablet TAKE 1 TABLET BY MOUTH EVERY DAY   benazepril  (LOTENSIN ) 40 MG tablet Take 40 mg by mouth daily.    clopidogrel  (PLAVIX ) 75 MG tablet TAKE 1 TABLET BY MOUTH EVERY DAY   ezetimibe  (ZETIA ) 10 MG tablet Take 1 tablet (10 mg total) by mouth daily.    finasteride  (PROSCAR ) 5 MG tablet Take 5 mg by mouth daily.   glucose blood (PRECISION QID TEST) test strip 1 each by Other route as directed.   hydrochlorothiazide  (HYDRODIURIL ) 25 MG tablet TAKE 1 TABLET (25 MG TOTAL) BY MOUTH DAILY.   Insulin  Pen Needle (B-D UF III MINI PEN NEEDLES) 31G X  5 MM MISC Use to inject Ozempic  once a week   isosorbide  mononitrate (IMDUR ) 60 MG 24 hr tablet Take 2 tablets (120 mg total) by mouth daily.   metFORMIN  (GLUCOPHAGE ) 500 MG tablet Take 1,000 mg by mouth 2 (two) times daily with a meal.   metoprolol  tartrate (LOPRESSOR ) 50 MG tablet TAKE 1 TABLET BY MOUTH TWICE A DAY   Multiple Vitamin (MULTIVITAMIN WITH MINERALS) TABS tablet Take 1 tablet by mouth daily.   nitroGLYCERIN  (NITROSTAT ) 0.4 MG SL tablet DISSOLVE 1 TAB UNDER THE TONGUE EVERY 5 MINUTES AS NEEDED FOR CHEST PAIN. MAX OF 3 DOSES, THEN 911.   NOVOLIN N FLEXPEN 100 UNIT/ML FlexPen Inject 18 Units into the skin 2 (two) times daily.   omeprazole (PRILOSEC) 20 MG capsule Take 20 mg by mouth every morning.   ranolazine  (RANEXA ) 1000 MG SR tablet Take 1 tablet (1,000 mg total) by mouth 2 (two) times daily.   rosuvastatin  (CRESTOR ) 40 MG tablet Take 1 tablet (40 mg total) by mouth daily.   "

## 2024-11-04 NOTE — Assessment & Plan Note (Addendum)
 He has done really well with lower extremity revascularization and he has no claudication symptoms at present.  Patient is appropriately treated with DAPT using aspirin  and clopidogrel  as well as high intensity statin.

## 2024-11-04 NOTE — Assessment & Plan Note (Addendum)
 Blood pressure well-controlled on multidrug therapy.  Reviewed with the patient today.

## 2024-11-04 NOTE — Assessment & Plan Note (Addendum)
 Treated with ezetimibe  and rosuvastatin  40 mg daily.  Lipids have been at goal.  Continue current management.  LDL goal less than 70 mg/dL.

## 2024-11-04 NOTE — Assessment & Plan Note (Addendum)
 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  He is appropriately treated with an ACE inhibitor in his antianginal therapy includes ranolazine , isosorbide , and metoprolol  tartrate.

## 2024-11-04 NOTE — Patient Instructions (Signed)
 Medication Instructions:  No medication changes were made at this visit. Continue current regimen.   *If you need a refill on your cardiac medications before your next appointment, please call your pharmacy*  Lab Work: None ordered today. If you have labs (blood work) drawn today and your tests are completely normal, you will receive your results only by: MyChart Message (if you have MyChart) OR A paper copy in the mail If you have any lab test that is abnormal or we need to change your treatment, we will call you to review the results.  Testing/Procedures: Your physician has requested that you have an echocardiogram prior to 1 year follow-up with Dr. Wonda. Echocardiography is a painless test that uses sound waves to create images of your heart. It provides your doctor with information about the size and shape of your heart and how well your heart's chambers and valves are working. This procedure takes approximately one hour. There are no restrictions for this procedure. Please do NOT wear cologne, perfume, aftershave, or lotions (deodorant is allowed). Please arrive 15 minutes prior to your appointment time.  Please note: We ask at that you not bring children with you during ultrasound (echo/ vascular) testing. Due to room size and safety concerns, children are not allowed in the ultrasound rooms during exams. Our front office staff cannot provide observation of children in our lobby area while testing is being conducted. An adult accompanying a patient to their appointment will only be allowed in the ultrasound room at the discretion of the ultrasound technician under special circumstances. We apologize for any inconvenience.   Follow-Up: At [facility], you and your health needs are our priority.  As part of our continuing mission to provide you with exceptional heart care, our providers are all part of one team.  This team includes your primary Cardiologist (physician) and  Advanced Practice Providers or APPs (Physician Assistants and Nurse Practitioners) who all work together to provide you with the care you need, when you need it.  Your next appointment:   1 year(s)  Provider:   Ozell Wonda, MD

## 2024-12-31 ENCOUNTER — Ambulatory Visit (HOSPITAL_COMMUNITY)

## 2024-12-31 ENCOUNTER — Ambulatory Visit: Admitting: Vascular Surgery

## 2025-01-02 ENCOUNTER — Ambulatory Visit

## 2025-04-03 ENCOUNTER — Ambulatory Visit

## 2025-07-03 ENCOUNTER — Ambulatory Visit

## 2025-10-02 ENCOUNTER — Ambulatory Visit

## 2026-01-01 ENCOUNTER — Ambulatory Visit
